# Patient Record
Sex: Female | Born: 1950
Health system: Southern US, Community
[De-identification: ages and names within clinical notes are randomized; demographics above are authoritative.]

## PROBLEM LIST (undated history)

## (undated) DIAGNOSIS — R079 Chest pain, unspecified: Secondary | ICD-10-CM

## (undated) DIAGNOSIS — M8588 Other specified disorders of bone density and structure, other site: Secondary | ICD-10-CM

## (undated) DIAGNOSIS — B1920 Unspecified viral hepatitis C without hepatic coma: Secondary | ICD-10-CM

## (undated) DIAGNOSIS — F329 Major depressive disorder, single episode, unspecified: Secondary | ICD-10-CM

## (undated) DIAGNOSIS — F32A Depression, unspecified: Secondary | ICD-10-CM

## (undated) DIAGNOSIS — R9439 Abnormal result of other cardiovascular function study: Secondary | ICD-10-CM

## (undated) DIAGNOSIS — M1990 Unspecified osteoarthritis, unspecified site: Secondary | ICD-10-CM

## (undated) DIAGNOSIS — J449 Chronic obstructive pulmonary disease, unspecified: Secondary | ICD-10-CM

## (undated) DIAGNOSIS — I10 Essential (primary) hypertension: Secondary | ICD-10-CM

## (undated) DIAGNOSIS — S82201A Unspecified fracture of shaft of right tibia, initial encounter for closed fracture: Secondary | ICD-10-CM

## (undated) HISTORY — DX: Depression, unspecified: F32.A

## (undated) HISTORY — DX: Chest pain, unspecified: R07.9

## (undated) HISTORY — DX: Unspecified osteoarthritis, unspecified site: M19.90

## (undated) HISTORY — DX: Major depressive disorder, single episode, unspecified: F32.9

## (undated) HISTORY — DX: Other specified disorders of bone density and structure, other site: M85.88

## (undated) HISTORY — DX: Unspecified viral hepatitis C without hepatic coma: B19.20

## (undated) HISTORY — DX: Abnormal result of other cardiovascular function study: R94.39

## (undated) HISTORY — DX: Unspecified fracture of shaft of right tibia, initial encounter for closed fracture: S82.201A

## (undated) HISTORY — DX: Chronic obstructive pulmonary disease, unspecified: J44.9

## (undated) HISTORY — DX: Essential (primary) hypertension: I10

---

## 2003-04-12 ENCOUNTER — Emergency Department (HOSPITAL_COMMUNITY): Admission: EM | Admit: 2003-04-12 | Discharge: 2003-04-12 | Payer: Self-pay

## 2004-05-04 ENCOUNTER — Emergency Department (HOSPITAL_COMMUNITY): Admission: EM | Admit: 2004-05-04 | Discharge: 2004-05-05 | Payer: Self-pay | Admitting: Emergency Medicine

## 2006-11-04 ENCOUNTER — Encounter: Admission: RE | Admit: 2006-11-04 | Discharge: 2006-11-04 | Payer: Self-pay | Admitting: Internal Medicine

## 2006-11-04 DIAGNOSIS — B182 Chronic viral hepatitis C: Secondary | ICD-10-CM | POA: Insufficient documentation

## 2007-01-05 ENCOUNTER — Ambulatory Visit: Payer: Self-pay | Admitting: Gastroenterology

## 2007-06-07 ENCOUNTER — Emergency Department (HOSPITAL_COMMUNITY): Admission: EM | Admit: 2007-06-07 | Discharge: 2007-06-08 | Payer: Self-pay | Admitting: Emergency Medicine

## 2007-10-25 ENCOUNTER — Encounter (INDEPENDENT_AMBULATORY_CARE_PROVIDER_SITE_OTHER): Payer: Self-pay | Admitting: Nurse Practitioner

## 2008-02-16 ENCOUNTER — Emergency Department (HOSPITAL_COMMUNITY): Admission: EM | Admit: 2008-02-16 | Discharge: 2008-02-16 | Payer: Self-pay | Admitting: Family Medicine

## 2008-03-07 ENCOUNTER — Emergency Department (HOSPITAL_COMMUNITY): Admission: EM | Admit: 2008-03-07 | Discharge: 2008-03-07 | Payer: Self-pay | Admitting: Emergency Medicine

## 2008-03-24 ENCOUNTER — Ambulatory Visit: Payer: Self-pay | Admitting: Nurse Practitioner

## 2008-03-24 DIAGNOSIS — F17200 Nicotine dependence, unspecified, uncomplicated: Secondary | ICD-10-CM | POA: Insufficient documentation

## 2008-03-24 DIAGNOSIS — K029 Dental caries, unspecified: Secondary | ICD-10-CM | POA: Insufficient documentation

## 2008-03-27 ENCOUNTER — Telehealth (INDEPENDENT_AMBULATORY_CARE_PROVIDER_SITE_OTHER): Payer: Self-pay | Admitting: Nurse Practitioner

## 2008-04-04 ENCOUNTER — Encounter (INDEPENDENT_AMBULATORY_CARE_PROVIDER_SITE_OTHER): Payer: Self-pay | Admitting: Nurse Practitioner

## 2008-04-25 ENCOUNTER — Encounter (INDEPENDENT_AMBULATORY_CARE_PROVIDER_SITE_OTHER): Payer: Self-pay | Admitting: *Deleted

## 2008-05-05 ENCOUNTER — Ambulatory Visit: Payer: Self-pay | Admitting: Nurse Practitioner

## 2008-05-17 LAB — CONVERTED CEMR LAB
Alkaline Phosphatase: 62 units/L (ref 39–117)
BUN: 23 mg/dL (ref 6–23)
Basophils Absolute: 0 10*3/uL (ref 0.0–0.1)
Creatinine, Ser: 0.81 mg/dL (ref 0.40–1.20)
Eosinophils Absolute: 0.1 10*3/uL (ref 0.0–0.7)
Glucose, Bld: 102 mg/dL — ABNORMAL HIGH (ref 70–99)
HCV Quantitative: 2900000 intl units/mL — ABNORMAL HIGH (ref <43)
Hep A Total Ab: POSITIVE — AB
Hep B E Ab: POSITIVE — AB
Hep B S Ab: POSITIVE — AB
Lymphs Abs: 1.7 10*3/uL (ref 0.7–4.0)
MCV: 93.5 fL (ref 78.0–100.0)
Neutrophils Relative %: 48 % (ref 43–77)
Platelets: 183 10*3/uL (ref 150–400)
Sodium: 139 meq/L (ref 135–145)
Total Bilirubin: 0.5 mg/dL (ref 0.3–1.2)
Total Protein: 7.9 g/dL (ref 6.0–8.3)
WBC: 3.9 10*3/uL — ABNORMAL LOW (ref 4.0–10.5)

## 2008-06-07 ENCOUNTER — Encounter (INDEPENDENT_AMBULATORY_CARE_PROVIDER_SITE_OTHER): Payer: Self-pay | Admitting: Nurse Practitioner

## 2008-06-08 ENCOUNTER — Encounter (INDEPENDENT_AMBULATORY_CARE_PROVIDER_SITE_OTHER): Payer: Self-pay | Admitting: Nurse Practitioner

## 2008-06-16 ENCOUNTER — Encounter (INDEPENDENT_AMBULATORY_CARE_PROVIDER_SITE_OTHER): Payer: Self-pay | Admitting: Nurse Practitioner

## 2008-06-22 ENCOUNTER — Encounter (INDEPENDENT_AMBULATORY_CARE_PROVIDER_SITE_OTHER): Payer: Self-pay | Admitting: Nurse Practitioner

## 2008-06-22 ENCOUNTER — Ambulatory Visit: Payer: Self-pay | Admitting: Gastroenterology

## 2008-07-17 ENCOUNTER — Ambulatory Visit: Payer: Self-pay | Admitting: Nurse Practitioner

## 2008-09-04 ENCOUNTER — Ambulatory Visit: Payer: Self-pay | Admitting: Nurse Practitioner

## 2008-09-10 HISTORY — PX: TIBIA FRACTURE SURGERY: SHX806

## 2008-09-23 ENCOUNTER — Inpatient Hospital Stay (HOSPITAL_COMMUNITY): Admission: EM | Admit: 2008-09-23 | Discharge: 2008-09-27 | Payer: Self-pay | Admitting: Emergency Medicine

## 2008-09-23 DIAGNOSIS — S82209A Unspecified fracture of shaft of unspecified tibia, initial encounter for closed fracture: Secondary | ICD-10-CM | POA: Insufficient documentation

## 2008-09-23 DIAGNOSIS — S82409A Unspecified fracture of shaft of unspecified fibula, initial encounter for closed fracture: Secondary | ICD-10-CM

## 2008-09-23 DIAGNOSIS — S62609B Fracture of unspecified phalanx of unspecified finger, initial encounter for open fracture: Secondary | ICD-10-CM | POA: Insufficient documentation

## 2008-10-04 ENCOUNTER — Telehealth (INDEPENDENT_AMBULATORY_CARE_PROVIDER_SITE_OTHER): Payer: Self-pay | Admitting: Nurse Practitioner

## 2008-10-30 ENCOUNTER — Encounter (INDEPENDENT_AMBULATORY_CARE_PROVIDER_SITE_OTHER): Payer: Self-pay | Admitting: Nurse Practitioner

## 2008-10-30 ENCOUNTER — Ambulatory Visit: Payer: Self-pay | Admitting: Nurse Practitioner

## 2008-10-30 DIAGNOSIS — N959 Unspecified menopausal and perimenopausal disorder: Secondary | ICD-10-CM | POA: Insufficient documentation

## 2008-10-30 DIAGNOSIS — H6120 Impacted cerumen, unspecified ear: Secondary | ICD-10-CM | POA: Insufficient documentation

## 2008-11-01 ENCOUNTER — Encounter (INDEPENDENT_AMBULATORY_CARE_PROVIDER_SITE_OTHER): Payer: Self-pay | Admitting: Nurse Practitioner

## 2008-11-06 ENCOUNTER — Ambulatory Visit (HOSPITAL_COMMUNITY): Admission: RE | Admit: 2008-11-06 | Discharge: 2008-11-06 | Payer: Self-pay | Admitting: Gastroenterology

## 2008-11-06 ENCOUNTER — Encounter (INDEPENDENT_AMBULATORY_CARE_PROVIDER_SITE_OTHER): Payer: Self-pay | Admitting: Interventional Radiology

## 2008-11-08 ENCOUNTER — Ambulatory Visit (HOSPITAL_COMMUNITY): Admission: RE | Admit: 2008-11-08 | Discharge: 2008-11-08 | Payer: Self-pay | Admitting: Internal Medicine

## 2008-11-08 ENCOUNTER — Ambulatory Visit: Payer: Self-pay | Admitting: Nurse Practitioner

## 2008-11-09 ENCOUNTER — Encounter (INDEPENDENT_AMBULATORY_CARE_PROVIDER_SITE_OTHER): Payer: Self-pay | Admitting: Nurse Practitioner

## 2008-11-29 ENCOUNTER — Encounter (INDEPENDENT_AMBULATORY_CARE_PROVIDER_SITE_OTHER): Payer: Self-pay | Admitting: Nurse Practitioner

## 2009-02-19 ENCOUNTER — Ambulatory Visit: Payer: Self-pay | Admitting: Nurse Practitioner

## 2009-02-19 DIAGNOSIS — H547 Unspecified visual loss: Secondary | ICD-10-CM | POA: Insufficient documentation

## 2009-02-23 ENCOUNTER — Telehealth (INDEPENDENT_AMBULATORY_CARE_PROVIDER_SITE_OTHER): Payer: Self-pay | Admitting: Nurse Practitioner

## 2009-05-03 ENCOUNTER — Ambulatory Visit: Payer: Self-pay | Admitting: Nurse Practitioner

## 2009-11-02 ENCOUNTER — Telehealth (INDEPENDENT_AMBULATORY_CARE_PROVIDER_SITE_OTHER): Payer: Self-pay | Admitting: Nurse Practitioner

## 2009-11-05 ENCOUNTER — Encounter (INDEPENDENT_AMBULATORY_CARE_PROVIDER_SITE_OTHER): Payer: Self-pay | Admitting: Nurse Practitioner

## 2009-11-06 ENCOUNTER — Ambulatory Visit: Payer: Self-pay | Admitting: Nurse Practitioner

## 2009-11-06 ENCOUNTER — Other Ambulatory Visit: Admission: RE | Admit: 2009-11-06 | Discharge: 2009-11-06 | Payer: Self-pay | Admitting: Internal Medicine

## 2009-11-12 ENCOUNTER — Ambulatory Visit (HOSPITAL_COMMUNITY): Admission: RE | Admit: 2009-11-12 | Discharge: 2009-11-12 | Payer: Self-pay | Admitting: Internal Medicine

## 2009-11-13 ENCOUNTER — Encounter (INDEPENDENT_AMBULATORY_CARE_PROVIDER_SITE_OTHER): Payer: Self-pay | Admitting: Nurse Practitioner

## 2010-01-09 ENCOUNTER — Encounter (INDEPENDENT_AMBULATORY_CARE_PROVIDER_SITE_OTHER): Payer: Self-pay | Admitting: Nurse Practitioner

## 2010-03-03 ENCOUNTER — Encounter: Payer: Self-pay | Admitting: Gastroenterology

## 2010-03-04 ENCOUNTER — Encounter: Payer: Self-pay | Admitting: Gastroenterology

## 2010-03-10 LAB — CONVERTED CEMR LAB
ALT: 68 units/L — ABNORMAL HIGH (ref 0–35)
AST: 79 units/L — ABNORMAL HIGH (ref 0–37)
Albumin: 4.7 g/dL (ref 3.5–5.2)
Alkaline Phosphatase: 64 units/L (ref 39–117)
BUN: 17 mg/dL (ref 6–23)
Basophils Absolute: 0 10*3/uL (ref 0.0–0.1)
Basophils Relative: 0 % (ref 0–1)
Blood in Urine, dipstick: NEGATIVE
Blood in Urine, dipstick: NEGATIVE
Calcium: 9.9 mg/dL (ref 8.4–10.5)
Chloride: 102 meq/L (ref 96–112)
Cholesterol, target level: 200 mg/dL
Creatinine, Urine: 145.6 mg/dL
Eosinophils Absolute: 0.1 10*3/uL (ref 0.0–0.7)
GC Probe Amp, Genital: NEGATIVE
GC Probe Amp, Genital: NEGATIVE
HDL: 53 mg/dL (ref >39)
KOH Prep: NEGATIVE
Ketones, urine, test strip: NEGATIVE
MCHC: 34.4 g/dL (ref 30.0–36.0)
MCV: 93.2 fL (ref 78.0–100.0)
Microalb, Ur: 2.77 mg/dL — ABNORMAL HIGH (ref 0.00–1.89)
Microalb, Ur: 3.34 mg/dL — ABNORMAL HIGH (ref 0.00–1.89)
Monocytes Relative: 7 % (ref 3–12)
Neutro Abs: 1.8 10*3/uL (ref 1.7–7.7)
Neutrophils Relative %: 39 % — ABNORMAL LOW (ref 43–77)
Nitrite: NEGATIVE
Nitrite: NEGATIVE
Platelets: 233 10*3/uL (ref 150–400)
Potassium: 3.9 meq/L (ref 3.5–5.3)
Protein, U semiquant: NEGATIVE
RBC: 4.84 M/uL (ref 3.87–5.11)
Sodium: 140 meq/L (ref 135–145)
Specific Gravity, Urine: 1.025
Specific Gravity, Urine: 1.025
Total Protein: 8.4 g/dL — ABNORMAL HIGH (ref 6.0–8.3)
Triglycerides: 166 mg/dL — ABNORMAL HIGH (ref <150)
Urobilinogen, UA: 1
WBC: 4.7 10*3/uL (ref 4.0–10.5)

## 2010-03-12 NOTE — Progress Notes (Signed)
Summary: [facility] Intake (out of funds)  Phone Note Call from Patient Call back at [facility] Phone [phone]   Summary of Call: Mr Christine Hurley states that the provider referral her to go to "[facility]ake" ([employer]) and she spoke with Mamie Levers and she was informed that the place is out of funds for this program.  Pt wants to know if the provider can referral to another place because she still needs an eye care visit Initial call taken by: Manon Hilding,  February 23, 2009 1:03 PM  Follow-up for Phone Call        after speaking with Debra(eligibility)that program is out of money and the services for the blind are limited to what services  they can offer due to money restraints. Per [name] is to offer patient 2 organizations can contact directly for assistance. We are not able to make those referrals.  [facility] [phone] [facility] [phone] [name] has been notified and advised to give our office a call once she has spoken with these organizations as I am not familiar with their services. Pt agreed. Will forward to provider for FYI..... Follow-up by: Mikey College CMA,  February 26, 2009 11:22 AM  Additional Follow-up for Phone Call Additional follow up Details #1::        noted Additional Follow-up by: [name] FNP,  February 26, 2009 12:17 PM     Appended Document: [facility] Intake (out of funds) pt did get in contact with [facility] and they are sending her an application to complete and she will f/u with our office in a couple of weeks.Marland KitchenMarland KitchenMarland Kitchen

## 2010-03-12 NOTE — Assessment & Plan Note (Signed)
Summary: HTN   Vital Signs:  Patient profile:   60 year old female Menstrual status:  postmenopausal Weight:      171.1 pounds BMI:     29.48 BSA:     1.83 Temp:     97.7 degrees F oral Pulse rate:   54 / minute Pulse rhythm:   regular Resp:     16 per minute BP sitting:   179 / 93  (left arm) Cuff size:   regular  Vitals Entered By: Levon Hedger (May 03, 2009 3:37 PM) CC: renew medication, Hypertension Management Is Patient Diabetic? No Pain Assessment Patient in pain? no       Does patient need assistance? Functional Status Self care Ambulation Normal   CC:  renew medication and Hypertension Management.  History of Present Illness:  Pt into the office for follow up.  Optho - Pt was contacted by [facility] and has an eye appt on next week with a provider in [place].  She currently wears glasses but is having problems seeing and knows she needs an updated exam.   Social - pt is doing well.  No acute complaints today.  Hypertension History:      She denies headache, chest pain, and palpitations.  She notes no problems with any antihypertensive medication side effects.  Pt is taking meds as ordered.        Positive major cardiovascular risk factors include female age 56 years old or older, hypertension, and current tobacco user.  Negative major cardiovascular risk factors include negative family history for ischemic heart disease.        Further assessment for target organ damage reveals no history of ASHD, cardiac end-organ damage (CHF/LVH), stroke/TIA, peripheral vascular disease, renal insufficiency, or hypertensive retinopathy.     Habits & Providers  Alcohol-Tobacco-Diet     Alcohol drinks/day: 2     Alcohol Counseling: to STOP drinking     Alcohol type: beer     Tobacco Status: current     Tobacco Counseling: to quit use of tobacco products     Cigarette Packs/Day: 0.25     Year Started: age 64  Exercise-Depression-Behavior     Does Patient  Exercise: yes     Exercise Counseling: not indicated; exercise is adequate     Type of exercise: walking     Exercise (avg: min/session): 30-60     Have you felt down or hopeless? no     Have you felt little pleasure in things? no     Depression Counseling: not indicated; screening negative for depression     Drug Use: no     Seat Belt Use: 100     Sun Exposure: occasionally  Allergies (verified): No Known Drug Allergies  Social History: Does Patient Exercise:  yes  Review of Systems General:  Denies fever. CV:  Denies chest pain or discomfort. Resp:  Denies cough. GI:  Denies abdominal pain, nausea, and vomiting. MS:  Denies joint pain.  Physical Exam  General:  alert.   Head:  normocephalic.   Lungs:  normal breath sounds.   Heart:  normal rate and regular rhythm.   Abdomen:  normal bowel sounds.   Msk:  up to the exam table Neurologic:  alert & oriented X3.   Skin:  color normal.   Psych:  Oriented X3.     Impression & Recommendations:  Problem # 1:  HYPERTENSION, BENIGN ESSENTIAL (ICD-401.1) DASH diet bp is still elevated will increase meds Her updated medication  list for this problem includes:    Lisinopril-hydrochlorothiazide 20-25 Mg Tabs (Lisinopril-hydrochlorothiazide) ..... One tablet by mouth daily for blood pressure  Problem # 2:  TOBACCO ABUSE (ICD-305.1) advised cessation  Problem # 3:  HEPATITIS C (ICD-070.51) pt continues to drink ETOH despite Dx, advised pt not to drink  Complete Medication List: 1)  Lisinopril-hydrochlorothiazide 20-25 Mg Tabs (Lisinopril-hydrochlorothiazide) .... One tablet by mouth daily for blood pressure 2)  Tramadol Hcl 50 Mg Tabs (Tramadol hcl) .... One or two pills every 8 hours for pain  Hypertension Assessment/Plan:      The patient's hypertensive risk group is category B: At least one risk factor (excluding diabetes) with no target organ damage.  Her calculated 10 year risk of coronary heart disease is 13 %.   Today's blood pressure is 179/93.  Her blood pressure goal is < 140/90.  Patient Instructions: 1)  Your blood pressure is still high. 2)  Your blood pressure medications will be increased. 3)  Be sure to monitor your diet - avoid salty foods. 4)  Keep your eye appointment on next week. 5)  Follow up in 6 months for a complete physical exam Prescriptions: LISINOPRIL-HYDROCHLOROTHIAZIDE 20-25 MG TABS (LISINOPRIL-HYDROCHLOROTHIAZIDE) One tablet by mouth daily for blood pressure  #30 x 5   Entered and Authorized by:   [name] FNP   Signed by:   [name] FNP on 05/03/2009   Method used:   Print then Give to Patient   RxID:   [id number]

## 2010-03-12 NOTE — Progress Notes (Signed)
Summary: Query:  Refill lisinopril-HCTZ?  Phone Note Outgoing Call   Summary of Call: Do you want to refill her lisinopril-HCTZ?  Last seen 04/2009. Initial call taken by: Dutch Quint RN,  November 02, 2009 11:47 AM  Follow-up for Phone Call        advise pt that she need to schedule an appt in October she should be seen twice per year ok to refill Follow-up by: [name] FNP,  November 02, 2009 1:14 PM  Additional Follow-up for Phone Call Additional follow up Details #1::        Refills completed.  Phone is temporarily disconnected.  Dutch Quint RN  November 02, 2009 2:57 PM  Phone is temporarily disconnected.  Letter sent.  Dutch Quint RN  November 05, 2009 10:24 AM

## 2010-03-12 NOTE — Letter (Signed)
Summary: MAILED REQUESTED RECORDS TO [facility]  MAILED REQUESTED RECORDS TO [facility]   Imported By: Arta Bruce 01/09/2010 10:53:31  _____________________________________________________________________  External Attachment:    Type:   Image     Comment:   External Document

## 2010-03-12 NOTE — Assessment & Plan Note (Signed)
Summary: Complete Physical Exam   Vital Signs:  Patient profile:   60 year old female Menstrual status:  postmenopausal Height:      64 inches Weight:      165.6 pounds BMI:     28.53 BSA:     1.81 Temp:     97.4 degrees F oral Pulse rate:   54 / minute Pulse rhythm:   regular Resp:     20 per minute BP sitting:   160 / 100  (left arm) Cuff size:   regular  Vitals Entered By: Levon Hedger (November 06, 2009 11:41 AM)  Nutrition Counseling: Patient's BMI is greater than 25 and therefore counseled on weight management options.  History of Present Illness:  pt into the office for complete physical exam  PAP - last pap done 1 year ago. no family history of cervical or ovarian cancer postmenopausal  since year 2000  Mammogram - no family history of breast cancer last done 1 year ago.  Optho - no recent eye exam. she does wear reading glasses  Dental - no recent dental exam  tdap - up to date  Bone density - done in 2010  Social - pt has been married 5 times and is currently in a relationship with 2 men  Hypertension History:      She denies headache, chest pain, and palpitations.  She notes no problems with any antihypertensive medication side effects.        Positive major cardiovascular risk factors include female age 86 years old or older, hypertension, and current tobacco user.  Negative major cardiovascular risk factors include no history of diabetes and negative family history for ischemic heart disease.        Further assessment for target organ damage reveals no history of ASHD, cardiac end-organ damage (CHF/LVH), stroke/TIA, peripheral vascular disease, renal insufficiency, or hypertensive retinopathy.    Lipid Management History:      Positive NCEP/ATP III risk factors include female age 43 years old or older, current tobacco user, and hypertension.  Negative NCEP/ATP III risk factors include no history of early menopause without estrogen hormone replacement,  non-diabetic, no family history for ischemic heart disease, no ASHD (atherosclerotic heart disease), no prior stroke/TIA, no peripheral vascular disease, and no history of aortic aneurysm.        The patient states that she does not know about the "Therapeutic Lifestyle Change" diet.  The patient does not know about adjunctive measures for cholesterol lowering.  She expresses no side effects from her lipid-lowering medication.  The patient denies any symptoms to suggest myopathy or liver disease.      Habits & Providers  Alcohol-Tobacco-Diet     Alcohol drinks/day: 2     Alcohol Counseling: to STOP drinking     Alcohol type: beer     Tobacco Status: current     Tobacco Counseling: to quit use of tobacco products     Cigarette Packs/Day: 0.25     Year Started: age 57  Exercise-Depression-Behavior     Does Patient Exercise: yes     Exercise Counseling: not indicated; exercise is adequate     Type of exercise: walking     Exercise (avg: min/session): 30-60     Depression Counseling: not indicated; screening negative for depression     Drug Use: no     Seat Belt Use: 100     Sun Exposure: occasionally  Comments: PHQ-9 score = 10  Allergies: No Known Drug Allergies  Review  of Systems General:  Denies loss of appetite. Eyes:  Denies blurring. ENT:  Denies earache. CV:  Denies chest pain or discomfort. Resp:  Denies cough. GI:  Denies abdominal pain, constipation, nausea, and vomiting. GU:  Denies discharge. MS:  Denies joint pain. Derm:  Denies dryness. Neuro:  Denies headaches. Psych:  Denies anxiety and depression.  Physical Exam  General:  alert.   Head:  normocephalic.   Eyes:  pupils round.  glasses Ears:  right - cerumen impacted left - TM visible with bony landmarks present Nose:  no nasal discharge.   Mouth:  pharynx pink and moist and fair dentition.   Neck:  supple.   Chest Wall:  no mass.   Breasts:  skin/areolae normal, no masses, and no abnormal  thickening.   Lungs:  few scattered wheezes  Heart:  normal rate and regular rhythm.   Abdomen:  soft, non-tender, and normal bowel sounds.   Rectal:  external hemorrhoid(s).   Msk:  up to the exam table Pulses:  R radial normal and L radial normal.   Extremities:  no edema Neurologic:  alert & oriented X3.   Skin:  color normal.   Psych:  Oriented X3.    Pelvic Exam  Vulva:      normal appearance.   Urethra and Bladder:      Urethra--normal.   Vagina:      post-menopausal.   Cervix:      midposition.   Uterus:      smooth.   Adnexa:      nontender bilaterally.   Rectum:      + external hemorrhoids.      Impression & Recommendations:  Problem # 1:  ROUTINE GYNECOLOGICAL EXAMINATION (ICD-V72.31) labs done except lipids EKG done PHQ-9 score = 10 PAP done rec optho and dental exam guaiac negative Orders: Hemoccult Guaiac-1 spec.(in office) (82270) KOH/ WET Mount [phone]) Pap Smear, Thin Prep ( Collection of) (K4401) UA Dipstick w/o Micro (manual) (02725) T- GC Chlamydia (36644) T-TSH (03474-25956) Rapid HIV  (38756)  Problem # 2:  HYPERTENSION, BENIGN ESSENTIAL (ICD-401.1) BP elevated today pt smoking and drinking caffiene - educated pt that this elevates BP Her updated medication list for this problem includes:    Lisinopril-hydrochlorothiazide 20-25 Mg Tabs (Lisinopril-hydrochlorothiazide) ..... One tablet by mouth daily for blood pressure  Orders: T-Comprehensive Metabolic Panel (43329-51884) T-CBC w/Diff (16606-30160) T-Urine Microalbumin w/creat. ratio [phone]) T-TSH (54270-62376) EKG w/ Interpretation (93000)  Problem # 3:  TOBACCO ABUSE (ICD-305.1) advised cessation  Problem # 4:  UNSPECIFIED BREAST SCREENING (ICD-V76.10)  Orders: Mammogram (Screening) (Mammo)  Complete Medication List: 1)  Lisinopril-hydrochlorothiazide 20-25 Mg Tabs (Lisinopril-hydrochlorothiazide) .... One tablet by mouth daily for blood pressure 2)  Tramadol  Hcl 50 Mg Tabs (Tramadol hcl) .... One or two pills every 8 hours for pain  Hypertension Assessment/Plan:      The patient's hypertensive risk group is category B: At least one risk factor (excluding diabetes) with no target organ damage.  Her calculated 10 year risk of coronary heart disease is 13 %.  Today's blood pressure is 160/100.  Her blood pressure goal is < 140/90.  Lipid Assessment/Plan:      Based on NCEP/ATP III, the patient's risk factor category is "0-1 risk factors".  The patient's lipid goals are as follows: Total cholesterol goal is 200; LDL cholesterol goal is 130; HDL cholesterol goal is 40; Triglyceride goal is 150.    Patient Instructions: 1)  Schedule a nurse visit in 1 week  for fasting labs and blood pressure. 2)  Need lipids.  Blood pressure goal is 140/90 3)  You will be informed of any abnormal lab results. 4)  Flu vaccines will be here in October. Call to see when available so you can come get yours.  It is recommended yearly. 5)  Follow up with n.martin,fnp in 6 months for high blood pressure or sooner if necessary  Laboratory Results   Urine Tests    Routine Urinalysis   Color: yellow Glucose: negative   (Normal Range: Negative) Bilirubin: negative   (Normal Range: Negative) Ketone: negative   (Normal Range: Negative) Spec. Gravity: 1.025   (Normal Range: 1.003-1.035) Blood: negative   (Normal Range: Negative) pH: 6.0   (Normal Range: 5.0-8.0) Protein: negative   (Normal Range: Negative) Urobilinogen: 2.0   (Normal Range: 0-1) Nitrite: negative   (Normal Range: Negative) Leukocyte Esterace: trace   (Normal Range: Negative)    Date/Time Received: November 06, 2009 12:47 PM   Wet Mount/KOH Source: vaginal WBC/hpf: 1-5 Bacteria/hpf: rare Clue cells/hpf: none Yeast/hpf: none Trichomonas/hpf: none  Stool - Occult Blood Hemmoccult #1: negative Date: 11/06/2009    Laboratory Results   Urine Tests    Routine Urinalysis   Color:  yellow Glucose: negative   (Normal Range: Negative) Bilirubin: negative   (Normal Range: Negative) Ketone: negative   (Normal Range: Negative) Spec. Gravity: 1.025   (Normal Range: 1.003-1.035) Blood: negative   (Normal Range: Negative) pH: 6.0   (Normal Range: 5.0-8.0) Protein: negative   (Normal Range: Negative) Urobilinogen: 2.0   (Normal Range: 0-1) Nitrite: negative   (Normal Range: Negative) Leukocyte Esterace: trace   (Normal Range: Negative)      Wet Mount Wet Mount KOH: Negative   Prevention & Chronic Care Immunizations   Influenza vaccine: refused  (02/19/2009)   Influenza vaccine deferral: Refused  (02/19/2009)    Tetanus booster: 08/10/2008: historical - done in ER    Pneumococcal vaccine: Not documented  Colorectal Screening   Hemoccult: Not documented    Colonoscopy: Not documented  Other Screening   Pap smear:  Specimen Adequacy: Satisfactory for evaluation.   Interpretation/Result:Negative for intraepithelial Lesion or Malignancy.     (10/30/2008)    Mammogram: ASSESSMENT: Negative - BI-RADS 1^MM DIGITAL SCREENING  (11/08/2008)   Smoking status: current  (11/06/2009)   Smoking cessation counseling: yes  (03/24/2008)  Lipids   Total Cholesterol: 143  (11/08/2008)   LDL: 57  (11/08/2008)   LDL Direct: Not documented   HDL: 53  (11/08/2008)   Triglycerides: 166  (11/08/2008)  Hypertension   Last Blood Pressure: 160 / 100  (11/06/2009)   Serum creatinine: 0.81  (05/05/2008)   Serum potassium 4.6  (05/05/2008) CMP ordered   Self-Management Support :   Personal Goals (by the next clinic visit) :      Personal blood pressure goal: 140/90  (11/06/2009)   Patient will work on the following items until the next clinic visit to reach self-care goals:     Medications and monitoring: take my medicines every day, check my blood pressure, bring all of my medications to every visit  (11/06/2009)     Eating: limit or avoid alcohol  (11/06/2009)     Hypertension self-management support: Not documented

## 2010-03-12 NOTE — Assessment & Plan Note (Signed)
Summary: Vision problems   Vital Signs:  Patient profile:   60 year old female Menstrual status:  postmenopausal Weight:      168.2 pounds BMI:     28.98 BSA:     1.82 Temp:     97.6 degrees F oral Pulse rate:   55 / minute Pulse rhythm:   regular Resp:     16 per minute BP sitting:   167 / 96  (left arm) Cuff size:   regular  Vitals Entered By: Levon Hedger (February 19, 2009 11:18 AM) CC: eye problem, pt states that she can't see, Hypertension Management Is Patient Diabetic? No Pain Assessment Patient in pain? no       Does patient need assistance? Functional Status Self care Ambulation Normal   CC:  eye problem, pt states that she can't see, and Hypertension Management.  History of Present Illness:  Due to time constraints pt is not able to have a comprehensive follow up appointment. Only has 5 minutes to speak with provider  Vision - wears glasses but has noticed increased problems with her eyes.  Had appt in [facility] for an eye exam but she was not able to keep it due to transportation.  Recognizes that she does still need an eye exam and would like to be scheduled.  Last exam over 1 year ago  Hypertension History:      She denies headache, chest pain, and palpitations.  She notes no problems with any antihypertensive medication side effects.  reports she is taking meds as ordered but no meds in the office today with pt.        Positive major cardiovascular risk factors include female age 4 years old or older, hypertension, and current tobacco user.  Negative major cardiovascular risk factors include negative family history for ischemic heart disease.        Further assessment for target organ damage reveals no history of ASHD, cardiac end-organ damage (CHF/LVH), stroke/TIA, peripheral vascular disease, renal insufficiency, or hypertensive retinopathy.     Allergies (verified): No Known Drug Allergies  Review of Systems Eyes:  Complains of blurring; denies  red eye. CV:  Denies chest pain or discomfort. Resp:  Denies cough. GI:  Denies abdominal pain, nausea, and vomiting.  Physical Exam  General:  alert.   Head:  normocephalic.   Msk:  normal ROM.   Neurologic:  alert & oriented X3.   Skin:  color normal.   Psych:  Oriented X3.     Impression & Recommendations:  Problem # 1:  VISUAL ACUITY, DECREASED (ICD-369.9) pt does need an eye exam but is not able to afford to pay for an exam offer number to [facility] and perhaps she can be seen by their eye intake corrdinators  Problem # 2:  HYPERTENSION, BENIGN ESSENTIAL (ICD-401.1) elevated today DASH diet advised pt to continue to take meds but she may need a dose adjustment Her updated medication list for this problem includes:    Lisinopril-hydrochlorothiazide 10-12.5 Mg Tabs (Lisinopril-hydrochlorothiazide) ..... One tablet by mouth daily for blood pressure  Complete Medication List: 1)  Lisinopril-hydrochlorothiazide 10-12.5 Mg Tabs (Lisinopril-hydrochlorothiazide) .... One tablet by mouth daily for blood pressure 2)  Tramadol Hcl 50 Mg Tabs (Tramadol hcl) .... One or two pills every 8 hours for pain  Hypertension Assessment/Plan:      The patient's hypertensive risk group is category B: At least one risk factor (excluding diabetes) with no target organ damage.  Her calculated 10 year risk of  coronary heart disease is 13 %.  Today's blood pressure is 167/96.  Her blood pressure goal is < 140/90.  Patient Instructions: 1)  Eye care specialist  2)  Blood pressure is elevated today. Take medications as ordered. 3)  Follow up in 3 months with n.martin, fnp for blood pressure.  if still elevated then she needs a change in BP meds  Prevention & Chronic Care Immunizations   Influenza vaccine: refused  (02/19/2009)   Influenza vaccine deferral: Refused  (02/19/2009)    Tetanus booster: 08/10/2008: historical - done in ER    Pneumococcal vaccine: Not documented  Colorectal Screening    Hemoccult: Not documented    Colonoscopy: Not documented  Other Screening   Pap smear:  Specimen Adequacy: Satisfactory for evaluation.   Interpretation/Result:Negative for intraepithelial Lesion or Malignancy.     (10/30/2008)    Mammogram: ASSESSMENT: Negative - BI-RADS 1^MM DIGITAL SCREENING  (11/08/2008)   Smoking status: current  (10/30/2008)   Smoking cessation counseling: yes  (03/24/2008)  Lipids   Total Cholesterol: 143  (11/08/2008)   LDL: 57  (11/08/2008)   LDL Direct: Not documented   HDL: 53  (11/08/2008)   Triglycerides: 166  (11/08/2008)  Hypertension   Last Blood Pressure: 167 / 96  (02/19/2009)   Serum creatinine: 0.81  (05/05/2008)   Serum potassium 4.6  (05/05/2008)  Self-Management Support :    Hypertension self-management support: Not documented

## 2010-03-12 NOTE — Letter (Signed)
Summary: MAILED REQUESTED RECORDS TO [facility]  MAILED REQUESTED RECORDS TO [facility]   Imported By: Arta Bruce 01/09/2010 12:12:06  _____________________________________________________________________  External Attachment:    Type:   Image     Comment:   External Document

## 2010-03-12 NOTE — Letter (Signed)
Summary: Generic Letter  [facility]  [address] [place], [place] [address]   Phone: [phone]  Fax: [phone]        11/05/2009  [facility] [address] [place], [place]  [address]  Dear Ms. Nissen,  We have been unable to contact you by telephone.  Please contact our office, at your earliest convenience, so that we may speak with you.  Sincerely,   Dutch Quint RN

## 2010-03-12 NOTE — Letter (Signed)
Summary: *HSN Results Follow up  [facility]  [address] [place], [place] [address]   Phone: [phone]  Fax: [phone]      11/13/2009   [facility] [address] [place], [place]  [address]   Dear  [name],                            ____S.Drinkard,FNP   ____D. Gore,FNP       ____B. McPherson,MD   ____V. Rankins,MD    ____E. Mulberry,MD    _X___N. Daphine Deutscher, FNP  ____D. Reche Dixon, MD    ____K. Philipp Deputy, MD    ____Other     This letter is to inform you that your recent test(s):  ___X____Pap Smear    _______Lab Test     _______X-ray    ____X___ is within acceptable limits  _______ requires a medication change  _______ requires a follow-up lab visit  _______ requires a follow-up visit with your provider   Comments:  Pap Smear results are normal.       _________________________________________________________ If you have any questions, please contact our office [phone].                    Sincerely,    [name] FNP [facility]

## 2010-03-12 NOTE — Letter (Signed)
Summary: Generic Letter  [facility]  [address] [place], [place] [address]   Phone: [phone]  Fax: [phone]         11/05/2009  [facility] [address] [place], [place]  [address]  Dear Ms. Tryon,  We have been unable to contact you by telephone.  Please call our office, at your earliest convenience, so that we may speak with you.  Sincerely,   Dutch Quint RN

## 2010-03-12 NOTE — Progress Notes (Signed)
Summary: Office Visit//DEPRESSION SCREENING  Office Visit//DEPRESSION SCREENING   Imported By: Arta Bruce 11/06/2009 14:50:53  _____________________________________________________________________  External Attachment:    Type:   Image     Comment:   External Document

## 2010-05-17 LAB — APTT: aPTT: 28 seconds (ref 24–37)

## 2010-05-17 LAB — CBC
HCT: 40.2 % (ref 36.0–46.0)
Hemoglobin: 13.8 g/dL (ref 12.0–15.0)
MCHC: 34.3 g/dL (ref 30.0–36.0)
MCV: 94.2 fL (ref 78.0–100.0)
Platelets: 205 10*3/uL (ref 150–400)
RDW: 12.5 % (ref 11.5–15.5)

## 2010-05-18 LAB — DIFFERENTIAL
Basophils Relative: 1 % (ref 0–1)
Eosinophils Absolute: 0 10*3/uL (ref 0.0–0.7)
Eosinophils Relative: 0 % (ref 0–5)
Lymphs Abs: 1.2 10*3/uL (ref 0.7–4.0)
Monocytes Relative: 4 % (ref 3–12)

## 2010-05-18 LAB — CBC
HCT: 39.4 % (ref 36.0–46.0)
Hemoglobin: 11.8 g/dL — ABNORMAL LOW (ref 12.0–15.0)
MCHC: 35.1 g/dL (ref 30.0–36.0)
MCV: 95 fL (ref 78.0–100.0)
Platelets: 172 10*3/uL (ref 150–400)
RBC: 3.6 MIL/uL — ABNORMAL LOW (ref 3.87–5.11)
WBC: 7.1 10*3/uL (ref 4.0–10.5)
WBC: 8.2 10*3/uL (ref 4.0–10.5)

## 2010-05-18 LAB — BASIC METABOLIC PANEL
BUN: 15 mg/dL (ref 6–23)
CO2: 23 mEq/L (ref 19–32)
CO2: 24 mEq/L (ref 19–32)
Calcium: 8.1 mg/dL — ABNORMAL LOW (ref 8.4–10.5)
Chloride: 107 mEq/L (ref 96–112)
Creatinine, Ser: 0.71 mg/dL (ref 0.4–1.2)
GFR calc Af Amer: >60 mL/min (ref >60)
GFR calc non Af Amer: >60 mL/min (ref >60)
Potassium: 3.8 mEq/L (ref 3.5–5.1)
Sodium: 135 mEq/L (ref 135–145)

## 2010-05-18 LAB — PROTIME-INR
INR: 1 (ref 0.00–1.49)
Prothrombin Time: 12.8 seconds (ref 11.6–15.2)

## 2010-05-18 LAB — APTT: aPTT: 29 seconds (ref 24–37)

## 2010-06-25 NOTE — Discharge Summary (Signed)
NAMESHEILA, OCASIO             ACCOUNT NO.:  [id number]   MEDICAL RECORD NO.:  [id number]          PATIENT TYPE:  INP   LOCATION:  5010                         FACILITY:  [facility]   PHYSICIAN:  Almedia Balls. Ranell Patrick, M.D. DATE OF BIRTH:  1950/09/02   DATE OF ADMISSION:  09/23/2008  DATE OF DISCHARGE:  09/27/2008                               DISCHARGE SUMMARY   ADMISSION DIAGNOSIS:  Open fracture of right foot along with tibia-  fibula fracture on the right.   DISCHARGE DIAGNOSIS:  Open fracture of right foot along with tibia-  fibula fracture on the right status post open reduction and internal  fixation and incision and drainage of right foot.   BRIEF HISTORY:  The patient is a 60 year old female who sustained a  ground-level fall with a lawn mower came into the emergency room  complaining about ankle pain with multiple lacerations to the foot and  toes of the right foot.  The patient was admitted for surgical  management for these issues.   PROCEDURE:  The patient had a right foot irrigation with laceration  repair, percutaneous pinning of fractured toes along with an  intramedullary nail of the fractured tibia by Dr. Malon Kindle on  September 23, 2008.  Assistant was [employer].  No complications.   HOSPITAL COURSE:  The patient was admitted on September 23, 2008 for the  above-stated procedures which she tolerated well.  After adequate time  in post anesthesia care unit, she was transferred to 5000.  Postop day  1, the patient had actually complained but only minimal pain and was  actually doing quite well.  The patient was nonweightbearing on the  right lower extremity throughout her entire hospital stay.  She did have  her dressing changed on postop 3 and actually did quite well with well  healing wounds both to the anterior knee and to the lateral foot and to  the plantar aspect of the right foot.  Neurovascularly she was intact.  She had good range of motion of her toes  even status post severe injury.  The patient will work with physical therapy in the next couple of days,  was able to get around with nonweightbearing using a walker and her pain  was under acceptable limits and no need for further IV antibiotics as  the patient is to be discharged home on September 27, 2008.   CONDITION:  Stable.   DIET:  Regular.   DISCHARGE MEDICATIONS:  1. Hydrochlorothiazide 12.5 mg p.o. daily.  2. Robaxin 500 mg p.o. q.6 hours.  3. Percocet 5/325 one to two tablets q. 4-6 h. p.r.n. pain.   FOLLOWUP:  The patient will follow back up in 4-5 days with Dr. Malon Kindle.      Thomas B. Durwin Nora, P.A.      Almedia Balls. Ranell Patrick, M.D.  Electronically Signed    TBD/MEDQ  D:  09/27/2008  T:  09/27/2008  Job:  [id number]

## 2010-06-25 NOTE — Op Note (Signed)
NAMESEIRRA, KOS             ACCOUNT NO.:  [id number]   MEDICAL RECORD NO.:  [id number]          PATIENT TYPE:  INP   LOCATION:  1827                         FACILITY:  [facility]   PHYSICIAN:  Almedia Balls. Ranell Patrick, M.D. DATE OF BIRTH:  [birth year]   DATE OF PROCEDURE:  09/23/2008  DATE OF DISCHARGE:                               OPERATIVE REPORT   PREOPERATIVE DIAGNOSIS:  Open right foot fractures and closed tibia-  fibula fracture following one more accident.   POSTOPERATIVE DIAGNOSES:  1. Closed tibia-fibula fracture with intra-articular extension.  2. Open great toe distal phalanx fracture.  3. Open second and third toe proximal phalangeal fractures.  4. Open fourth toe distal phalanx fracture.   PROCEDURE PERFORMED:  Right foot irrigation and debridement of multiple  open fractures with pinning of second, third toe and suture repair of  fourth toe fractures and debridement of great toe with loose closure and  then combination of percutaneous cannulated screw fixation and closed  intramedullary nailing of right distal tibia-fibula fracture.   SURGEON:  Almedia Balls. Ranell Patrick, MD   ASSISTANT:  Donnie Coffin. Dixon, PA-C   ANESTHESIA:  General anesthesia was used.   ESTIMATED BLOOD LOSS:  Minimal.   FLUID REPLACEMENT:  2500 mL crystalloid.   URINE OUTPUT:  600 mL.   Tourniquet was placed on the thigh that was not elevated.  Instrument  counts correct.   COMPLICATIONS:  None.   Perioperative antibiotics including Ancef and gentamicin were given.   INDICATIONS:  The patient is a 60 year old female, who was mowing her  lawn on a sloped hill with wet grass.  When she slipped and lost her  balance, she pulled the lawn mower on top of her, injuring her right  foot and tib-fib.  She presented with an obvious open deformity to her  foot including plantar laceration at the flexion crease of the MTP  joints of the lesser toes, partially amputated fourth toe tip and a  partial amputation  of great toe tip.  Radiographs demonstrating proximal  phalanx fractures of second and third toes, distal fracture of the  fourth toe and a partial tough loss of her great toe and fracture into  the joint.  The patient also has a spiral comminuted distal tib-fib  fracture with multiplanar extension into the tibial plafond and ankle  joint, and we counseled the patient regarding the need to get urgently  to the operating room.  She had IV antibiotics including gentamicin and  Ancef given in the emergency department as well as tetanus toxoid  booster.  She presents now for I and D, and then definitive  stabilization.  Informed consent obtained.   DESCRIPTION OF PROCEDURE:  After adequate level of anesthesia achieved,  the patient positioned supine on the operating room table.  The  radiolucent table was utilized.  Nonsterile tourniquet was placed on  right proximal thigh.  We went and sterilely prepped and draped the leg  in the usual manner.  We initially used pulsatile lavage irrigation as  well as sharp debridement of all of her open injuries for her foot,  again  she had oblique loss of the distal portion of her great toe on the  medial side.  This involved the nail bed, involved the great toe distal  phalanx.  There was grass in there.  We pulled the grass, we debrided  all devitalized tissue.  She also had plantar laceration at the flexion  crease of her MTP joint, second, third, and fourth toe and a partial  amputation her fourth toe dorsally with loss of the nail fold and nail  bed.  We went ahead after we had thoroughly pulse irrigated, debrided  all the devitalized tissue and then loosely closed her wounds.  Once we  did that we went attended her second and third toes to stabilize her  unstable proximal phalangeal fractures pinning to the metatarsal heads.  We also placed suture and did a suture repair of her near avulsion of  her fourth toe tip through the nail and then through  the proximal nail  fold and actually reduced the fracture nicely and stabilized it.  With  good viability of all the toes and good capillary refill, we concluded  the foot portion surgery having irrigated with 3 liters of pulsatile  normal saline irrigation.  We then went ahead and approached the distal  tib-fib fracture.  Utilizing C-arm and multiplanar imaging, we placed  two 4.5 Synthes stainless steel cannulated screws from medial to lateral  just above the ankle joint and the tibial plafond to keep the anterior  to posterior fracture line from expanding when we placed the nail.  These were placed anterior and posterior to the midline tibia to try to  keep the midportion of the tibia open for nailing.  Once we had those  screws in place with no displacement of distal fracture, we went ahead  and made a medial parapatellar approach to the proximal tibia using a  #10 blade.  The patellar retinaculum was divided medially.  We stayed  extra-articular the entire time, guidepin placed, centered on the  proximal tibia just into the joint line, we then used a step-cut drill  to open up the proximal tibia and placed a ball-tip guidewire with a  slight bend and crossed the fractured tibia.  This was a spiral  segmental fracture and then into the center portion of the plafond  missing the two screws.  We then went ahead and sequentially reamed up  to a 10.5 mm diameter to accommodate a 9-mm diameter nail which was 33  cm in length.  This was introduced to antegrade over the guidepin and  guide pin was removed.  We then noted there to be excellent alignment of  the fracture site and then placed our proximal interlocking screw.  This  was the distal oblique screw on the DePuy VersaNail.  Once that screw  was in, we removed our insertion handle and then placed it in extension.  This gave [place] locate the distal screw, our fracture was nicely  approximated and aligned.  We went ahead and did our  anterior to  posterior screw first to try to lag some fracture lines that were in  that direction which allowed for the anterior-posterior screw to provide  some compression.  We had to go and do a cutdown on the anterior tibia  as we were overlying the anterior tibial artery and the tibialis  anterior tendon.  We spread bluntly down to bone and directly visualized  bone, and drilled our distal hole using a freehand technique with the  C-  arm.  We were able to get an anterior-posterior screw with a perfect  length and no displacement of the fracture site.  We then did our  medial, lateral, distal interlocking screws again with freehand  technique using the C-arm and had good length of the screws as well and  that gave [place] good purchase, excellent length and then no sign of any  displacement or fracture.  Her fracture was anatomically aligned with no  displacement of the joint and following this, we thoroughly irrigated  all wounds and closed with layered closure using Vicryl and staples.  Sterile compressive bandage was applied followed by a short-leg splint.  The patient was taken to recovery room in stable condition.      Almedia Balls. Ranell Patrick, M.D.  Electronically Signed    SRN/MEDQ  D:  09/23/2008  T:  09/24/2008  Job:  [id number]

## 2010-11-19 ENCOUNTER — Other Ambulatory Visit (HOSPITAL_COMMUNITY): Payer: Self-pay | Admitting: Family Medicine

## 2010-11-19 DIAGNOSIS — Z1231 Encounter for screening mammogram for malignant neoplasm of breast: Secondary | ICD-10-CM

## 2010-12-06 ENCOUNTER — Ambulatory Visit (HOSPITAL_COMMUNITY): Payer: Self-pay

## 2010-12-06 ENCOUNTER — Ambulatory Visit (HOSPITAL_COMMUNITY): Payer: Self-pay | Attending: Family Medicine

## 2010-12-26 ENCOUNTER — Encounter: Payer: Self-pay | Admitting: Gastroenterology

## 2011-03-14 DIAGNOSIS — R9439 Abnormal result of other cardiovascular function study: Secondary | ICD-10-CM

## 2011-03-14 DIAGNOSIS — R079 Chest pain, unspecified: Secondary | ICD-10-CM

## 2011-03-14 HISTORY — DX: Chest pain, unspecified: R07.9

## 2011-03-14 HISTORY — DX: Abnormal result of other cardiovascular function study: R94.39

## 2011-03-26 HISTORY — PX: DOPPLER ECHOCARDIOGRAPHY: SHX263

## 2011-03-27 HISTORY — PX: NM MYOVIEW LTD: HXRAD82

## 2012-03-02 ENCOUNTER — Other Ambulatory Visit: Payer: Self-pay | Admitting: Internal Medicine

## 2012-03-02 DIAGNOSIS — B1920 Unspecified viral hepatitis C without hepatic coma: Secondary | ICD-10-CM

## 2012-03-08 ENCOUNTER — Ambulatory Visit
Admission: RE | Admit: 2012-03-08 | Discharge: 2012-03-08 | Disposition: A | Discharge status: Home / Self Care | Payer: Medicaid Other | Source: Ambulatory Visit | Attending: Internal Medicine | Admitting: Internal Medicine

## 2012-03-08 DIAGNOSIS — B1920 Unspecified viral hepatitis C without hepatic coma: Secondary | ICD-10-CM

## 2012-06-25 ENCOUNTER — Encounter: Payer: Self-pay | Admitting: *Deleted

## 2012-06-26 ENCOUNTER — Encounter: Payer: Self-pay | Admitting: Cardiology

## 2012-06-28 ENCOUNTER — Ambulatory Visit: Payer: Medicaid Other | Admitting: Cardiology

## 2012-07-12 ENCOUNTER — Ambulatory Visit: Payer: Medicaid Other | Admitting: Cardiology

## 2012-07-16 ENCOUNTER — Encounter: Payer: Self-pay | Admitting: Cardiology

## 2012-07-16 ENCOUNTER — Ambulatory Visit (INDEPENDENT_AMBULATORY_CARE_PROVIDER_SITE_OTHER): Payer: Medicare Other | Admitting: Cardiology

## 2012-07-16 VITALS — BP 120/78 | HR 60 | Ht 66.0 in | Wt 160.3 lb

## 2012-07-16 DIAGNOSIS — R079 Chest pain, unspecified: Secondary | ICD-10-CM

## 2012-07-16 DIAGNOSIS — I10 Essential (primary) hypertension: Secondary | ICD-10-CM

## 2012-07-16 DIAGNOSIS — R002 Palpitations: Secondary | ICD-10-CM

## 2012-07-16 DIAGNOSIS — R9439 Abnormal result of other cardiovascular function study: Secondary | ICD-10-CM | POA: Diagnosis not present

## 2012-07-16 NOTE — Progress Notes (Signed)
Patient ID: Christine Hurley, female   DOB: 1950/11/06, 62 y.o.   MRN: [id number]  Clinic Note: HPI: Christine Hurley is a 62 y.o. female with a PMH below who presents today for roughly one-year followup for chest discomfort, palpitations and distal insertion..  Interval History: Over last year, she stopped working. She is now retired. She is trying to become more active and walking around but she does note that maybe she walks around [employer] she'll need to stop and sit down. That seems to be more do to her right leg giving out on her as opposed to due to dyspnea. She does get dyspneic on exertion but not distended she was before. She denies any chest pressure chest tightness associated with that. No PND, orthopnea, edema, she does have mild ankle swelling of the right leg does not edematous. No melena, hematochezia, hematuria. No lightheadedness, dizziness, wooziness, CK/near-syncope symptoms. No TIA or amaurosis fugax symptoms.  Past Medical History  Diagnosis Date  . Hypertension   . Hepatitis C   . Abnormal nuclear stress test February '13    Small, partially reversible inferolateral ischemia. Marked hypertensive response to exercise. Low risk.  . Right tibial fracture    . Chest pain with minimal risk for cardiac etiology February 2013    Evaluated with echocardiogram and Myoview, do   Prior cardiac evaluation and past surgical history: Past Surgical History  Procedure Laterality Date  . Tibia fracture surgery Right 09/2008    fracture of right lower leg with open reduction and internal fixation  . Doppler echocardiography  03/26/2011    LVEF>55% normal LV wall thickness, normal LA, mild aortic sclerosis with trace to mild AI, mild to moderate TR, RVSP of , RA pressure about 5 mmHg  . Nm myoview ltd  03/27/11    post stress ejection fraction is 72%, abnormal myocardial perfusion study, this is a low risk scan    No Known Allergies  Current Outpatient Prescriptions    Medication Sig Dispense Refill  . lisinopril-hydrochlorothiazide (PRINZIDE,ZESTORETIC) 20-25 MG per tablet Take 1 tablet by mouth daily.       No current facility-administered medications for this visit.    History   Social History  . Marital Status: Divorced    Spouse Name: N/A    Number of Children: 1  . Years of Education: N/A   Occupational History  . Not on file.   Social History Main Topics  . Smoking status: Former Smoker    Types: Cigarettes    Quit date: 10/12/2010  . Smokeless tobacco: Never Used  . Alcohol Use: No  . Drug Use: No  . Sexually Active: Not on file   Other Topics Concern  . Not on file   Social History Narrative  . No narrative on file   ROS: A comprehensive Review of Systems - Negative except pertinent positives above Musculoskeletal ROS: positive for - joint pain, joint swelling, muscle pain and pain in ankle - right, foot - right and leg - right - result of her prior injury (runover by lawnmower)  PHYSICAL EXAM BP 120/78  Pulse 60  Ht 5\' 6"  (1.676 m)  Wt 160 lb 4.8 oz (72.712 kg)  BMI 25.89 kg/m2 General appearance: alert, cooperative, no distress and Pleasant mood and affect. Neck: no carotid bruit and no JVD Lungs: clear to auscultation bilaterally, normal percussion bilaterally and Nonlabored, good air movement. Heart: regular rate and rhythm, S1, S2 normal, no S3 or S4, systolic murmur: systolic ejection 1/6, crescendo  and decrescendo at 2nd right intercostal space, radiates to carotids and no rub Abdomen: soft, non-tender; bowel sounds normal; no masses,  no organomegaly Extremities: extremities normal, atraumatic, no cyanosis or edema and Well-healed surgical scar on the right leg. Pulses: 2+ and symmetric HEENT: [place]/AT/EOMI, MMM, anicteric sclera, noninjected conjunctiva  FAO:ZHYQMVHQI today: Yes Rate: 60 , Rhythm: Normal sinus, left atrial abnormality, otherwise normal ECG  ASSESSMENT: Overall very stable.  PLAN: Per problem  list. Orders Placed This Encounter  Procedures  . Lipid Profile  . Comp Met (CMET)  . EKG 12-Lead    Followup: One year  Brendalee Matthies W, M.D., M.S. [facility] [address] [place], [place]  [address]  [phone] Pager # [phone] 07/17/2012 11:35 AM

## 2012-07-16 NOTE — Patient Instructions (Addendum)
Your Blood Pressure & Heart rate seem to be doing well.  It is about time to check your cholesterol levels -- we will give you a lab slip for fasting cholesterol levels.  If the "slow heart rate" sensations start to occur with increased frequency, let us know.  We will have you wear a monitor to try to document what is happening.  We will see you back in 1 yr.  Marykay Lex, MD   Addendum:  It also likely restart your aspirin 81 mg for primary prevention of heart attack or stroke.

## 2012-07-17 ENCOUNTER — Encounter: Payer: Self-pay | Admitting: Cardiology

## 2012-07-17 DIAGNOSIS — R002 Palpitations: Secondary | ICD-10-CM | POA: Insufficient documentation

## 2012-07-17 NOTE — Assessment & Plan Note (Signed)
No longer a major concern here as far as cardiac symptoms. Recommend taking aspirin, blood pressure control. If symptoms recur, would consider beta blocker however with some of her symptoms just simply being fatigued, I decided not to use beta blockers in the past. We'll check fasting lipid panel for risk factor evaluation.

## 2012-07-17 NOTE — Assessment & Plan Note (Signed)
She says that she has these episodes were "heart slows down for low for a little bit ", however this only happens maybe once a month. If it happens more frequently, then we would consider him wear monitor to ensure that she has no significant bradycardia. This would be another reason to not use beta blocker for now.

## 2012-07-17 NOTE — Assessment & Plan Note (Signed)
Blood pressure is good today. She's not on the amlodipine that she had been on before by mouth intake and accommodation ACE inhibitor with diuretic. Checking chemistry panel all LFTs lipids.

## 2012-07-17 NOTE — Assessment & Plan Note (Signed)
Currently not having the symptoms that she was having before with minimal if any chest discomfort. She still gets tired and fatigued with being out about but denies any chest pain with it. I suspect her abnormal Myoview he may very well be due to artifact. But the best plan for now the medical therapy. I would like for her to take an aspirin daily along with the ACE inhibitor.

## 2013-04-15 ENCOUNTER — Institutional Professional Consult (permissible substitution): Payer: Medicaid Other | Admitting: Emergency Medicine

## 2013-05-03 ENCOUNTER — Institutional Professional Consult (permissible substitution): Payer: Medicare Other | Admitting: Emergency Medicine

## 2013-05-17 ENCOUNTER — Emergency Department (HOSPITAL_COMMUNITY)
Admission: EM | Admit: 2013-05-17 | Discharge: 2013-05-17 | Disposition: A | Discharge status: Home / Self Care | Payer: Medicare Other | Attending: Emergency Medicine | Admitting: Emergency Medicine

## 2013-05-17 ENCOUNTER — Encounter (HOSPITAL_COMMUNITY): Payer: Self-pay | Admitting: Emergency Medicine

## 2013-05-17 DIAGNOSIS — Z79899 Other long term (current) drug therapy: Secondary | ICD-10-CM | POA: Insufficient documentation

## 2013-05-17 DIAGNOSIS — K029 Dental caries, unspecified: Secondary | ICD-10-CM | POA: Diagnosis not present

## 2013-05-17 DIAGNOSIS — Z87891 Personal history of nicotine dependence: Secondary | ICD-10-CM | POA: Diagnosis not present

## 2013-05-17 DIAGNOSIS — K089 Disorder of teeth and supporting structures, unspecified: Secondary | ICD-10-CM | POA: Diagnosis present

## 2013-05-17 DIAGNOSIS — Z8619 Personal history of other infectious and parasitic diseases: Secondary | ICD-10-CM | POA: Diagnosis not present

## 2013-05-17 DIAGNOSIS — Z792 Long term (current) use of antibiotics: Secondary | ICD-10-CM | POA: Diagnosis not present

## 2013-05-17 DIAGNOSIS — Z8781 Personal history of (healed) traumatic fracture: Secondary | ICD-10-CM | POA: Insufficient documentation

## 2013-05-17 DIAGNOSIS — I10 Essential (primary) hypertension: Secondary | ICD-10-CM | POA: Insufficient documentation

## 2013-05-17 DIAGNOSIS — K0889 Other specified disorders of teeth and supporting structures: Secondary | ICD-10-CM

## 2013-05-17 MED ORDER — HYDROCODONE-HOMATROPINE 5-1.5 MG/5ML PO SYRP
5.0000 mL | ORAL_SOLUTION | Freq: Once | ORAL | Status: AC
  Administered 2013-05-17: 5 mL via ORAL
  Filled 2013-05-17: qty 5

## 2013-05-17 MED ORDER — PENICILLIN V POTASSIUM 500 MG PO TABS: 500.0000 mg | ORAL_TABLET | Freq: Four times a day (QID) | ORAL | Status: DC

## 2013-05-17 MED ORDER — HYDROCODONE-IBUPROFEN 7.5-200 MG PO TABS: 1.0000 | ORAL_TABLET | Freq: Four times a day (QID) | ORAL | Status: DC | PRN

## 2013-05-17 MED ORDER — LIDOCAINE VISCOUS 2 % MT SOLN: 20.0000 mL | OROMUCOSAL | Status: DC | PRN

## 2013-05-17 MED ORDER — LIDOCAINE VISCOUS 2 % MT SOLN
15.0000 mL | Freq: Once | OROMUCOSAL | Status: AC
  Administered 2013-05-17: 15 mL via OROMUCOSAL
  Filled 2013-05-17: qty 15

## 2013-05-17 NOTE — ED Provider Notes (Signed)
CSN: [id number]     Arrival date & time 05/17/13  2585 History   First MD Initiated Contact with Patient 05/17/13 [id number]     Chief Complaint  Patient presents with  . Allergic Reaction     (Consider location/radiation/quality/duration/timing/severity/associated sxs/prior Treatment) HPI Comments: Patient is a 63 year old female past medical history significant for hypertension, hepatitis C presented to the emergency department for right upper dental pain. Patient states she was eating butter pecan ice cream on Friday without her dentures and developed pain to her remaining upper tooth. Patient states since then she has had a severe sharp pain with radiation to right side of face and associated mild gum swelling. She said she's tried Motrin without relief of her symptoms. She denies any oropharyngeal swelling, difficulty breathing, difficulty swallowing, nausea, vomiting. Patient does have a dentist.  Patient is a 63 y.o. female presenting with allergic reaction.  Allergic Reaction Presenting symptoms: no difficulty swallowing and no wheezing     Past Medical History  Diagnosis Date  . Hypertension   . Hepatitis C   . Abnormal nuclear stress test February '13    Small, partially reversible inferolateral ischemia. Marked hypertensive response to exercise. Low risk.  . Right tibial fracture    . Chest pain with minimal risk for cardiac etiology February 2013    Evaluated with echocardiogram and Myoview, do   Past Surgical History  Procedure Laterality Date  . Tibia fracture surgery Right 09/2008    fracture of right lower leg with open reduction and internal fixation  . Doppler echocardiography  03/26/2011    LVEF>55% normal LV wall thickness, normal LA, mild aortic sclerosis with trace to mild AI, mild to moderate TR, RVSP of 21mmHg, RA pressure about 5 mmHg  . Nm myoview ltd  03/27/11    post stress ejection fraction is 72%, abnormal myocardial perfusion study, this is a low risk scan    No family history on file. History  Substance Use Topics  . Smoking status: Former Smoker    Types: Cigarettes    Quit date: 10/12/2010  . Smokeless tobacco: Never Used  . Alcohol Use: No   OB History   Grav Para Term Preterm Abortions TAB SAB Ect Mult Living                 Review of Systems  Constitutional: Negative for fever and chills.  HENT: Positive for dental problem and facial swelling. Negative for sinus pressure, sore throat, trouble swallowing and voice change.   Respiratory: Negative for shortness of breath, wheezing and stridor.   Cardiovascular: Negative for chest pain.  All other systems reviewed and are negative.      Allergies  Review of patient's allergies indicates no known allergies.  Home Medications   Current Outpatient Rx  Name  Route  Sig  Dispense  Refill  . HYDROcodone-ibuprofen (VICOPROFEN) 7.5-200 MG per tablet   Oral   Take 1 tablet by mouth every 6 (six) hours as needed for moderate pain.   30 tablet   0   . lidocaine (XYLOCAINE) 2 % solution   Mouth/Throat   Use as directed 20 mLs in the mouth or throat as needed for mouth pain.   100 mL   0   . lisinopril-hydrochlorothiazide (PRINZIDE,ZESTORETIC) 20-25 MG per tablet   Oral   Take 1 tablet by mouth daily.         . penicillin v potassium (VEETID) 500 MG tablet   Oral   Take 1  tablet (500 mg total) by mouth 4 (four) times daily.   40 tablet   0    BP 104/98  Pulse 97  Temp(Src) 98.4 F (36.9 C) (Oral)  Resp 14  Ht 5\' 6"  (1.676 m)  Wt 155 lb (70.308 kg)  BMI 25.03 kg/m2  SpO2 100% Physical Exam  Nursing note and vitals reviewed. Constitutional: She is oriented to person, place, and time. She appears well-developed and well-nourished. No distress.  HENT:  Head: Normocephalic and atraumatic.  Right Ear: External ear normal.  Left Ear: External ear normal.  Nose: Nose normal.  Mouth/Throat: Uvula is midline, oropharynx is clear and moist and mucous membranes are  normal. No oral lesions. No trismus in the jaw. Abnormal dentition. Dental caries present. No dental abscesses or uvula swelling. No oropharyngeal exudate.    No gross dental abscess.  Tooth is TTP.   Eyes: Conjunctivae are normal.  Neck: Normal range of motion. Neck supple.  Cardiovascular: Normal rate, regular rhythm and normal heart sounds.   Pulmonary/Chest: Effort normal and breath sounds normal. No respiratory distress.  Neurological: She is alert and oriented to person, place, and time.  Skin: Skin is warm and dry. She is not diaphoretic.  Psychiatric: She has a normal mood and affect.    ED Course  Procedures (including critical care time) Medications  lidocaine (XYLOCAINE) 2 % viscous mouth solution 15 mL (15 mLs Mouth/Throat Given 05/17/13 0716)  HYDROcodone-homatropine (HYCODAN) 5-1.5 MG/5ML syrup 5 mL (5 mLs Oral Given 05/17/13 0716)    Labs Review Labs Reviewed - No data to display Imaging Review No results found.   EKG Interpretation None      MDM   Final diagnoses:  Pain, dental    Filed Vitals:   05/17/13 0547  BP: 104/98  Pulse: 97  Temp: 98.4 F (36.9 C)  Resp: 14   Afebrile, NAD, non-toxic appearing, AAOx4. No gross facial swelling. No posterior oropharyngeal edema. No lip or tongue swelling. No respiratory distress. No stridor or wheezing.   Patient with toothache.  No gross abscess.  Exam unconcerning for Ludwig's angina or spread of infection.  Will treat with penicillin and pain medicine.  Urged patient to follow-up with dentist. Return precautions discussed. Patient is agreeable to plan. Patient is stable at time of discharge       Harlow Mares, PA-C 05/17/13 8088

## 2013-05-17 NOTE — ED Notes (Signed)
Pt presents with swelling to right side of face between right nasal fold up to eye, down to jaw. Painful to smile, attempts to lift lip to look at gums painful as well. Swelling not noticed to gum area. Is unclear why but woke her every two hours last night.  No dental caries visible, most teeth are missing and pt left her false teeth at home.

## 2013-05-17 NOTE — ED Notes (Signed)
Pt. reports right facial itch / swelling onset last Friday after eating Butter Pecan Ice Cream , respirations unlabored / airway intact .

## 2013-05-17 NOTE — Discharge Instructions (Signed)
Please follow up with your primary care physician in 1-2 days. If you do not have one please call the [facility] number listed above. Please follow up with dentist to schedule a follow up appointment. Please take your antibiotic until completion. Please take pain medication and/or muscle relaxants as prescribed and as needed for pain. Please do not drive on narcotic pain medication or on muscle relaxants. Please read all discharge instructions and return precautions.      Dental Pain A tooth ache may be caused by cavities (tooth decay). Cavities expose the nerve of the tooth to air and hot or cold temperatures. It may come from an infection or abscess (also called a boil or furuncle) around your tooth. It is also often caused by dental caries (tooth decay). This causes the pain you are having. DIAGNOSIS  Your caregiver can diagnose this problem by exam. TREATMENT   If caused by an infection, it may be treated with medications which kill germs (antibiotics) and pain medications as prescribed by your caregiver. Take medications as directed.  Only take over-the-counter or prescription medicines for pain, discomfort, or fever as directed by your caregiver.  Whether the tooth ache today is caused by infection or dental disease, you should see your dentist as soon as possible for further care. SEEK MEDICAL CARE IF: The exam and treatment you received today has been provided on an emergency basis only. This is not a substitute for complete medical or dental care. If your problem worsens or new problems (symptoms) appear, and you are unable to meet with your dentist, call or return to this location. SEEK IMMEDIATE MEDICAL CARE IF:   You have a fever.  You develop redness and swelling of your face, jaw, or neck.  You are unable to open your mouth.  You have severe pain uncontrolled by pain medicine. MAKE SURE YOU:   Understand these instructions.  Will watch your  condition.  Will get help right away if you are not doing well or get worse. Document Released: 01/27/2005 Document Revised: 04/21/2011 Document Reviewed: 09/15/2007 [facility] Patient Information 2014 [place].  RESOURCE GUIDE  If you do not have a primary care doctor to follow up with regarding today's visit, please call the [facility] at [phone] to make an appointment. Hours of operation are 10am - 7pm, Monday through Friday, and they have a sliding scale fee.    Dental Assistance Please contact the on-call dentist listed on your discharge papers WITHIN 48 HOURS.  If the on-call dentist agrees that your condition is emergent, there is a CHANCE (not a guarantee) that you MAY receive a savings on your visit at this point. If you wait more than 48 hours, it will not be considered an emergency.   [facility]rs[facility]:  [address], [place], [place], [address], [place]  Short-notice availability  Tooth evaluation: $100  Emergency Treatment: $200 (including exam, xrays, tooth extraction, and post-op visit)  Patients with Medicaid: [place] [id number], [place] [address], [phone]  If unable to pay, or uninsured, contact [facility] [phone] in [place], [place] in [place]) to become qualified for the adult dental clinic  Other [place]: [place], [place], [place], [address], [place], [place], 2nd and 4th Thursday of the month at 6:30am.  10 clients each day by appointment, can sometimes see walk-in patients if someone  does not show for an appointment. [facility]:  [address]er [place], [place], [address], [facility]:  [address], [place], [place], [address], [place] Department:  [place] Department:   [place] Department:  [phone]

## 2013-05-18 ENCOUNTER — Encounter (HOSPITAL_COMMUNITY): Payer: Self-pay | Admitting: Emergency Medicine

## 2013-05-18 ENCOUNTER — Emergency Department (HOSPITAL_COMMUNITY)
Admission: EM | Admit: 2013-05-18 | Discharge: 2013-05-19 | Disposition: A | Discharge status: Home / Self Care | Payer: Medicare Other | Attending: Emergency Medicine | Admitting: Emergency Medicine

## 2013-05-18 ENCOUNTER — Emergency Department (HOSPITAL_COMMUNITY): Payer: Medicare Other

## 2013-05-18 DIAGNOSIS — I10 Essential (primary) hypertension: Secondary | ICD-10-CM | POA: Diagnosis not present

## 2013-05-18 DIAGNOSIS — K029 Dental caries, unspecified: Secondary | ICD-10-CM | POA: Insufficient documentation

## 2013-05-18 DIAGNOSIS — R0989 Other specified symptoms and signs involving the circulatory and respiratory systems: Principal | ICD-10-CM | POA: Insufficient documentation

## 2013-05-18 DIAGNOSIS — Z8781 Personal history of (healed) traumatic fracture: Secondary | ICD-10-CM | POA: Insufficient documentation

## 2013-05-18 DIAGNOSIS — R1110 Vomiting, unspecified: Secondary | ICD-10-CM | POA: Insufficient documentation

## 2013-05-18 DIAGNOSIS — R0602 Shortness of breath: Secondary | ICD-10-CM | POA: Diagnosis present

## 2013-05-18 DIAGNOSIS — Z87891 Personal history of nicotine dependence: Secondary | ICD-10-CM | POA: Diagnosis not present

## 2013-05-18 DIAGNOSIS — K047 Periapical abscess without sinus: Secondary | ICD-10-CM

## 2013-05-18 DIAGNOSIS — R0609 Other forms of dyspnea: Secondary | ICD-10-CM | POA: Diagnosis not present

## 2013-05-18 DIAGNOSIS — Z79899 Other long term (current) drug therapy: Secondary | ICD-10-CM | POA: Insufficient documentation

## 2013-05-18 DIAGNOSIS — R0600 Dyspnea, unspecified: Secondary | ICD-10-CM

## 2013-05-18 DIAGNOSIS — J3489 Other specified disorders of nose and nasal sinuses: Secondary | ICD-10-CM | POA: Insufficient documentation

## 2013-05-18 DIAGNOSIS — Z8619 Personal history of other infectious and parasitic diseases: Secondary | ICD-10-CM | POA: Diagnosis not present

## 2013-05-18 NOTE — ED Notes (Signed)
Patient transported to X-ray 

## 2013-05-18 NOTE — ED Notes (Signed)
Pt c/o shortness of breath while talking. Pt was seen her Tuesday for dental pain. Pt reports an intolerance to the medication she was prescribed. Pt reports emesis x 2 today. Last dose of medication was around 3 pm today. Pt reports taking medication without food. Pt reports difficulty breathing through nose and mouth. Pt currently breathing through nose without any difficulty though a surgical mask, SpO2 100% on RA. Pt states she smokes a pack every 2 days. Pt is A&Ox4, respirations equal and unlabored, skin warm and dry.

## 2013-05-19 NOTE — Discharge Instructions (Signed)
Call for a follow up appointment with a Family or Primary Care Provider. Call a dentist for further evaluation of your dental abscess.  Return if Symptoms worsen.   Take medication as prescribed, continue taking penicillin.  Make sure you are eating when taking your pain medication.  Try and press and get as much pus/ infection out of your mouth as you can.

## 2013-05-19 NOTE — ED Provider Notes (Signed)
CSN: [id number]     Arrival date & time 05/18/13  1917 History   First MD Initiated Contact with Patient 05/18/13 2334     Chief Complaint  Patient presents with  . Shortness of Breath  . Emesis  . Nasal Congestion     (Consider location/radiation/quality/duration/timing/severity/associated sxs/prior Treatment) HPI Comments: Christine Hurley is a 63 y.o. female with a past medical history of dental abscess, HTN  presenting the Emergency Department with a chief complaint of 2 episodes of shortness of breath prior to arrival.  She reports increase in dyspnea with talking during a nonexertional or emotional conversation. She reports family member telling her to calm down and to focus on breathing.  She reports this resolved the dyspnea.  She reports another episode while in triage today, resolved.  The patient reports questionable anxiety. She reports compliance with penicillin from her visit for dental abscess yesterday.  Denies chest pain, palpitations, cough, lower extremity swelling, wheezing, hives, rash. She reports an increase in dental pain with facial swelling.  Denies fever.   Patient is a 63 y.o. female presenting with shortness of breath and vomiting. The history is provided by the patient and medical records. No language interpreter was used.  Shortness of Breath Associated symptoms: vomiting   Associated symptoms: no chest pain, no cough, no fever, no rash and no wheezing   Emesis Associated symptoms: no chills     Past Medical History  Diagnosis Date  . Hypertension   . Hepatitis C   . Abnormal nuclear stress test February '13    Small, partially reversible inferolateral ischemia. Marked hypertensive response to exercise. Low risk.  . Right tibial fracture    . Chest pain with minimal risk for cardiac etiology February 2013    Evaluated with echocardiogram and Myoview, do   Past Surgical History  Procedure Laterality Date  . Tibia fracture surgery Right 09/2008   fracture of right lower leg with open reduction and internal fixation  . Doppler echocardiography  03/26/2011    LVEF>55% normal LV wall thickness, normal LA, mild aortic sclerosis with trace to mild AI, mild to moderate TR, RVSP of 19mmHg, RA pressure about 5 mmHg  . Nm myoview ltd  03/27/11    post stress ejection fraction is 72%, abnormal myocardial perfusion study, this is a low risk scan   History reviewed. No pertinent family history. History  Substance Use Topics  . Smoking status: Former Smoker    Types: Cigarettes    Quit date: 10/12/2010  . Smokeless tobacco: Never Used  . Alcohol Use: No   OB History   Grav Para Term Preterm Abortions TAB SAB Ect Mult Living                 Review of Systems  Constitutional: Negative for fever and chills.  Respiratory: Positive for shortness of breath. Negative for cough and wheezing.   Cardiovascular: Negative for chest pain, palpitations and leg swelling.  Gastrointestinal: Positive for vomiting. Negative for nausea.  Skin: Negative for rash.      Allergies  Review of patient's allergies indicates no known allergies.  Home Medications   Current Outpatient Rx  Name  Route  Sig  Dispense  Refill  . lisinopril-hydrochlorothiazide (PRINZIDE,ZESTORETIC) 20-25 MG per tablet   Oral   Take 1 tablet by mouth daily.          BP 95/69  Pulse 79  Temp(Src) 99.2 F (37.3 C) (Oral)  Resp 18  Ht 5\' 6"  (1.676  m)  Wt 153 lb 11.2 oz (69.718 kg)  BMI 24.82 kg/m2  SpO2 100% Physical Exam  Nursing note and vitals reviewed. Constitutional: She is oriented to person, place, and time. She appears well-developed and well-nourished. No distress.  HENT:  Head: Normocephalic and atraumatic.  Mouth/Throat: Uvula is midline and oropharynx is clear and moist. No trismus in the jaw. Abnormal dentition. Dental abscesses and dental caries present. No uvula swelling. No oropharyngeal exudate.    Airway patent. Posterior Oropharynx clear. Able  to handle secretions.  Multiple missing teeth.  Multiple dental caries in reaming teeth. Dental abscess to the gum of where tooth #8 was. Fluctuation noted.  Eyes: EOM are normal.  Neck: Neck supple.  Cardiovascular: Normal rate, regular rhythm and normal heart sounds.   No lower extremity edema  Pulmonary/Chest: Effort normal and breath sounds normal. No respiratory distress. She has no wheezes. She has no rales. She exhibits no tenderness.  Patient is able to speak in complete sentences.   Abdominal: Soft. There is no tenderness. There is no rebound and no guarding.  Musculoskeletal: Normal range of motion.  Neurological: She is alert and oriented to person, place, and time.  Skin: Skin is warm and dry. No rash noted. Rash is not urticarial. She is not diaphoretic.  Psychiatric: She has a normal mood and affect. Her behavior is normal.    ED Course  INCISION AND DRAINAGE Date/Time: 05/19/2013 12:23 AM Performed by: Lorrine Kin Authorized by: Lorrine Kin Consent: Verbal consent obtained. Risks and benefits: risks, benefits and alternatives were discussed Consent given by: patient Patient understanding: patient states understanding of the procedure being performed Patient consent: the patient's understanding of the procedure matches consent given Procedure consent: procedure consent matches procedure scheduled Required items: required blood products, implants, devices, and special equipment available Patient identity confirmed: verbally with patient and arm band Time out: Immediately prior to procedure a "time out" was called to verify the correct patient, procedure, equipment, support staff and site/side marked as required. Type: abscess Body area: mouth (tooth #8 gum) Anesthesia: nerve block (Infraorbital) Local anesthetic: bupivacaine 0.5% with epinephrine Anesthetic total: 3 ml Patient sedated: no Scalpel size: 11 Needle gauge: 22 Incision type: single  straight Complexity: complex Drainage: purulent and  bloody Drainage amount: moderate Wound treatment: wound left open Packing material: none Patient tolerance: Patient tolerated the procedure well with no immediate complications.   (including critical care time) Labs Review Labs Reviewed - No data to display Imaging Review Dg Chest 2 View  05/18/2013   CLINICAL DATA:  Shortness of breath.  Emesis.  EXAM: CHEST  2 VIEW  COMPARISON:  None.  FINDINGS: Normal heart size the aortic tortuosity. No acute infiltrate or edema. No effusion or pneumothorax. Concavity in the ventral cortex of the upper sternal body is presumably chronic given the history. No acute osseous findings.  IMPRESSION: No active cardiopulmonary disease.   Electronically Signed   By: Jorje Guild M.D.   On: 05/18/2013 21:17     EKG Interpretation None      MDM   Final diagnoses:  Dental abscess  Dyspnea   Pt with episodic dyspnea. Asymptomatic at this time. Oxygen saturation 100% RA, no signs of distress. XR without acute findings.  No signs of allergic reaction to medication.   Dental abscess drained. Will have the patient continue ABx and f/u with dentist. Discussed lab results, imaging results, and treatment plan with the patient. Return precautions given. Reports understanding and no other  concerns at this time.  Patient is stable for discharge at this time.  Meds given in ED:  Medications - No data to display  Discharge Medication List as of 05/19/2013 12:27 AM          Lorrine Kin, PA-C 05/20/13 1635

## 2013-05-20 ENCOUNTER — Institutional Professional Consult (permissible substitution): Payer: Self-pay | Admitting: Emergency Medicine

## 2013-05-21 NOTE — ED Provider Notes (Signed)
Medical screening examination/treatment/procedure(s) were performed by non-physician practitioner and as supervising physician I was immediately available for consultation/collaboration.    Teressa Lower, MD 05/21/13 [phone]

## 2013-05-23 NOTE — ED Provider Notes (Signed)
Medical screening examination/treatment/procedure(s) were performed by non-physician practitioner and as supervising physician I was immediately available for consultation/collaboration.   EKG Interpretation None        Elyn Peers, MD 05/23/13 [phone]

## 2013-05-26 DIAGNOSIS — I10 Essential (primary) hypertension: Secondary | ICD-10-CM | POA: Insufficient documentation

## 2013-06-01 ENCOUNTER — Encounter: Payer: Self-pay | Admitting: Emergency Medicine

## 2013-06-01 ENCOUNTER — Ambulatory Visit (INDEPENDENT_AMBULATORY_CARE_PROVIDER_SITE_OTHER): Payer: Medicare Other | Admitting: Emergency Medicine

## 2013-06-01 VITALS — BP 100/70 | HR 64 | Ht 66.0 in | Wt 155.6 lb

## 2013-06-01 DIAGNOSIS — R0989 Other specified symptoms and signs involving the circulatory and respiratory systems: Secondary | ICD-10-CM

## 2013-06-01 DIAGNOSIS — R0609 Other forms of dyspnea: Secondary | ICD-10-CM

## 2013-06-01 NOTE — Addendum Note (Signed)
Addended by: Carlos American A on: 06/01/2013 02:27 PM   Modules accepted: Orders

## 2013-06-01 NOTE — Patient Instructions (Signed)
We will set up full pulmonary function testing at your next office visit Walking oximetry today We will arrange for you to see Dr Ellyn Hack to discuss the symptoms you have been experiencing Follow with Dr Lamonte Sakai next available with full PFT

## 2013-06-01 NOTE — Assessment & Plan Note (Signed)
Suspect more insidious onset, worst over last 3 months. Probable COPD component here. She has seen Dr Ellyn Hack before - low risk for CAD/angina but needs re-eval.  - continue Advair for now - full PFt - walking oximetry - referral to see Dr Ellyn Hack

## 2013-06-01 NOTE — Progress Notes (Signed)
Subjective:    Patient ID: Christine Hurley, female    DOB: May 08, 1950, 63 y.o.   MRN: [id number]  HPI 63 yo former smoker (10 pk-yrs), HTN, Hep C, Chest pain s/p low-risk cards eval in 2/13.  Also w hx R LE trauma and fx. Referred by Sammuel Bailiff for evaluation of dyspnea. She quit smoking about 3 months ago. Up until 3 months ago her breathing had been good. She began to notice exertional SOB about that time. She has had chest tightness and pain on 3 of these occasions but not all. She had spirometry done in her PCP office in the past. She was given Advair 3 weeks ago, has been using qam. She has heard some wheezing when sitting still. She has some cough, sore throat, non-productive.    Review of Systems  Constitutional: Positive for unexpected weight change. Negative for fever.  HENT: Positive for dental problem. Negative for congestion, ear pain, nosebleeds, postnasal drip, rhinorrhea, sinus pressure, sneezing, sore throat and trouble swallowing.   Eyes: Negative for redness and itching.  Respiratory: Positive for shortness of breath. Negative for cough, chest tightness and wheezing.   Cardiovascular: Positive for chest pain, palpitations and leg swelling.       Foot swelling  Gastrointestinal: Negative for nausea and vomiting.  Genitourinary: Negative for dysuria.  Musculoskeletal: Negative for joint swelling.  Skin: Negative for rash.  Neurological: Negative for headaches.  Hematological: Does not bruise/bleed easily.  Psychiatric/Behavioral: Positive for dysphoric mood. The patient is nervous/anxious.    Past Medical History  Diagnosis Date  . Hypertension   . Hepatitis C   . Abnormal nuclear stress test February '13    Small, partially reversible inferolateral ischemia. Marked hypertensive response to exercise. Low risk.  . Right tibial fracture    . Chest pain with minimal risk for cardiac etiology February 2013    Evaluated with echocardiogram and Myoview, do     Family  History  Problem Relation Age of Onset  . Asthma Mother   . Cancer Mother     Brain  . Cancer - Prostate Father      History   Social History  . Marital Status: Divorced    Spouse Name: N/A    Number of Children: 1  . Years of Education: N/A   Occupational History  . Not on file.   Social History Main Topics  . Smoking status: Former Smoker -- 0.25 packs/day for 35 years    Types: Cigarettes    Quit date: 09/10/2012  . Smokeless tobacco: Never Used  . Alcohol Use: No  . Drug Use: No  . Sexual Activity: Not on file   Other Topics Concern  . Not on file   Social History Narrative  . No narrative on file     No Known Allergies   Outpatient Prescriptions Prior to Visit  Medication Sig Dispense Refill  . lisinopril-hydrochlorothiazide (PRINZIDE,ZESTORETIC) 20-25 MG per tablet Take 1 tablet by mouth daily.       No facility-administered medications prior to visit.       Objective:   Physical Exam Filed Vitals:   06/01/13 1352  BP: 100/70  Pulse: 64  Height: 5\' 6"  (1.676 m)  Weight: 155 lb 9.6 oz (70.58 kg)  SpO2: 95%   Gen: Pleasant, well-nourished, in no distress,  normal affect  ENT: No lesions,  mouth clear,  oropharynx clear, no postnasal drip  Neck: No JVD, no TMG, no carotid bruits  Lungs: No use of  accessory muscles, no dullness to percussion, clear without rales or rhonchi  Cardiovascular: RRR, heart sounds normal, no murmur or gallops, no peripheral edema  Abdomen: soft and NT, no HSM,  BS normal  Musculoskeletal: No deformities, no cyanosis or clubbing  Neuro: alert, non focal  Skin: Warm, no lesions or rashes     Assessment & Plan:  Dyspnea on exertion Suspect more insidious onset, worst over last 3 months. Probable COPD component here. She has seen Dr Ellyn Hack before - low risk for CAD/angina but needs re-eval.  - continue Advair for now - full PFt - walking oximetry - referral to see Dr Ellyn Hack

## 2013-06-03 ENCOUNTER — Ambulatory Visit: Payer: Self-pay | Admitting: Cardiology

## 2013-06-03 ENCOUNTER — Ambulatory Visit (INDEPENDENT_AMBULATORY_CARE_PROVIDER_SITE_OTHER): Payer: Medicare Other | Admitting: Cardiology

## 2013-06-03 VITALS — BP 110/62 | HR 55 | Ht 66.0 in | Wt 155.0 lb

## 2013-06-03 DIAGNOSIS — R0609 Other forms of dyspnea: Secondary | ICD-10-CM

## 2013-06-03 DIAGNOSIS — R0989 Other specified symptoms and signs involving the circulatory and respiratory systems: Secondary | ICD-10-CM

## 2013-06-03 DIAGNOSIS — I10 Essential (primary) hypertension: Secondary | ICD-10-CM

## 2013-06-03 DIAGNOSIS — R0600 Dyspnea, unspecified: Secondary | ICD-10-CM

## 2013-06-03 NOTE — Patient Instructions (Signed)
Your physician recommends that you schedule a follow-up appointment  If you have any further problems or as needed  We are going to schedule a 2-d-Echo

## 2013-06-06 ENCOUNTER — Encounter: Payer: Self-pay | Admitting: Cardiology

## 2013-06-06 DIAGNOSIS — I10 Essential (primary) hypertension: Secondary | ICD-10-CM | POA: Insufficient documentation

## 2013-06-06 NOTE — Assessment & Plan Note (Signed)
COPD is a likely contributor for her symptoms, in the setting of prior tobacco use, however will assess for potential cardiac etiologies with a 2D echo. Will assess systolic and diastolic function, valvular anatomy and will r/o effusion. In the absences of chest pain symptoms concerning for angina, will not order repeat NST at this time. If 2D echo shows any reduction in systolic function or if any WMA are demonstrated, then will need to pursue an ischemic eval. Dr. Lamonte Sakai has ordered PFTs to assess for pulmonary etiologies.

## 2013-06-06 NOTE — Progress Notes (Signed)
Patient ID: Christine Hurley, female   DOB: 06/18/1950, 63 y.o.   MRN: [id number]    06/06/2013 Christine Hurley   05-Feb-1951  [id number]  Primary Physicia Berkley Harvey, NP Primary Cardiologist: Dr. Ellyn Hack  HPI:  Christine Hurley is a 63 y/o female, who has been referred back to our office, by Dr. Lamonte Sakai, pulmonologist, for evaluation for dyspnea. She is followed by Dr. Ellyn Hack. In Feb. 2013, she underwent a cardiac work-up for chest pain. She underwent a nuclear stress test, which was a low risk study. 2D echo at that time revealed normal left ventricular systolic function, with an  EF of >55%.  There was mild aortic sclerosis with trace to mild AI and mild to moderate TR.  Her other significant cardiac risk factors include HTN and a past 10+ year history of tobacco abuse, having quit ~6 months ago.  She was seen recently by Dr. Lamonte Sakai, for evaluation for exertional dyspnea. He felt that a probably COPD component was contributing, however he suggest that she be further evaluated for cardiac etiologies.  She presents to clinic today with a complaint of DOE that has seemed to progressively worsened over the last 3 months.  As mentioned above, she quit smoking about 6 months ago and denies any further tobacco use since then. She denies resting dyspnea. Her symptoms resolve after resting. She denies orthopnea, PND, LEE and weight gain. She does note about 3 episodes of chest tightness over the last several months that woke her from her sleep. However, each episode was quickly relived after using her inhaler. She denies any other occurrences of chest pain. She states that she often walks her two dogs and she mows her own lawn, using a push mower, and she denies any exertional chest pain with these activities. She also denies any episodes of dizziness, syncope, near syncope, palpitations, diaphoresis, n/v.     Current Outpatient Prescriptions  Medication Sig Dispense Refill  . amLODipine (NORVASC) 10 MG tablet  1 tablet daily.      [name] escitalopram (LEXAPRO) 5 MG tablet 1 tablet daily.      . hydrOXYzine (ATARAX/VISTARIL) 25 MG tablet 1 tablet 2 (two) times daily as needed.      [name] lisinopril-hydrochlorothiazide (PRINZIDE,ZESTORETIC) 20-25 MG per tablet Take 1 tablet by mouth daily.      . penicillin v potassium (VEETID) 500 MG tablet 1 tablet daily.      [name] PROAIR HFA 108 (90 BASE) MCG/ACT inhaler 1 puff every 6 (six) hours as needed.       No current facility-administered medications for this visit.    No Known Allergies  History   Social History  . Marital Status: Divorced    Spouse Name: N/A    Number of Children: 1  . Years of Education: N/A   Occupational History  . Not on file.   Social History Main Topics  . Smoking status: Former Smoker -- 0.25 packs/day for 35 years    Types: Cigarettes    Quit date: 09/10/2012  . Smokeless tobacco: Never Used  . Alcohol Use: No  . Drug Use: No  . Sexual Activity: Not on file   Other Topics Concern  . Not on file   Social History Narrative  . No narrative on file     Review of Systems: General: negative for chills, fever, night sweats or weight changes.  Cardiovascular: negative for chest pain, dyspnea on exertion, edema, orthopnea, palpitations, paroxysmal nocturnal dyspnea or shortness of breath Dermatological: negative for  rash Respiratory: negative for cough or wheezing Urologic: negative for hematuria Abdominal: negative for nausea, vomiting, diarrhea, bright red blood per rectum, melena, or hematemesis Neurologic: negative for visual changes, syncope, or dizziness All other systems reviewed and are otherwise negative except as noted above.    Blood pressure 110/62, pulse 55, height 5\' 6"  (1.676 m), weight 155 lb (70.308 kg).  General appearance: alert, cooperative and no distress Neck: no carotid bruit and no JVD Lungs: clear to auscultation bilaterally Heart: regular rate and rhythm, S1, S2 normal, no murmur, click, rub or  gallop Extremities: no LEE Pulses: 2+ and symmetric Skin: warm and dry Neurologic: Grossly normal  EKG sinus brady, 55pbm  ASSESSMENT AND PLAN:   Dyspnea on exertion COPD is a likely contributor for her symptoms, in the setting of prior tobacco use, however will assess for potential cardiac etiologies with a 2D echo. Will assess systolic and diastolic function, valvular anatomy and will r/o effusion. In the absences of chest pain symptoms concerning for angina, will not order repeat NST at this time. If 2D echo shows any reduction in systolic function or if any WMA are demonstrated, then will need to pursue an ischemic eval. Dr. Lamonte Sakai has ordered PFTs to assess for pulmonary etiologies.   Hypertension Well controlled today at 110/62. Continue amlodipine, lisinopril and HCTZ.     PLAN  Will order a 2D echo to see if dyspnea is due to any cardiac dysfunction.  Will have patient f/u if abnormal. If normal, I suspect her problems may be mainly due to possible COPD. Recommend PFTs to further assess.  If echo is normal, then patient will need to f/u with Dr. Ellyn Hack at time of yearly cardiac eval.   Brittainy SimmonsPA-C 06/06/2013 11:41 AM

## 2013-06-06 NOTE — Assessment & Plan Note (Signed)
Well controlled today at [place]. Continue amlodipine, lisinopril and HCTZ.

## 2013-06-14 ENCOUNTER — Ambulatory Visit (HOSPITAL_COMMUNITY)
Admission: RE | Admit: 2013-06-14 | Discharge: 2013-06-14 | Disposition: A | Discharge status: Home / Self Care | Payer: Medicare Other | Source: Ambulatory Visit | Attending: Cardiovascular Disease | Admitting: Cardiovascular Disease

## 2013-06-14 DIAGNOSIS — R0609 Other forms of dyspnea: Secondary | ICD-10-CM | POA: Diagnosis present

## 2013-06-14 DIAGNOSIS — R002 Palpitations: Secondary | ICD-10-CM | POA: Diagnosis not present

## 2013-06-14 DIAGNOSIS — I10 Essential (primary) hypertension: Secondary | ICD-10-CM | POA: Diagnosis not present

## 2013-06-14 DIAGNOSIS — R0600 Dyspnea, unspecified: Secondary | ICD-10-CM

## 2013-06-14 DIAGNOSIS — R079 Chest pain, unspecified: Secondary | ICD-10-CM | POA: Diagnosis not present

## 2013-06-14 DIAGNOSIS — Z87891 Personal history of nicotine dependence: Secondary | ICD-10-CM | POA: Insufficient documentation

## 2013-06-14 DIAGNOSIS — R0989 Other specified symptoms and signs involving the circulatory and respiratory systems: Secondary | ICD-10-CM | POA: Diagnosis present

## 2013-06-14 NOTE — Progress Notes (Signed)
2D Echocardiogram Complete.  06/14/2013   Amarrah Meinhart, RDCS 

## 2013-07-01 ENCOUNTER — Ambulatory Visit (HOSPITAL_COMMUNITY)
Admission: RE | Admit: 2013-07-01 | Discharge: 2013-07-01 | Disposition: A | Discharge status: Home / Self Care | Payer: Medicare Other | Source: Ambulatory Visit | Attending: Emergency Medicine | Admitting: Emergency Medicine

## 2013-07-01 ENCOUNTER — Ambulatory Visit (INDEPENDENT_AMBULATORY_CARE_PROVIDER_SITE_OTHER): Payer: Medicare Other | Admitting: Emergency Medicine

## 2013-07-01 ENCOUNTER — Encounter: Payer: Self-pay | Admitting: Emergency Medicine

## 2013-07-01 VITALS — BP 118/82 | HR 101 | Ht 66.0 in | Wt 150.0 lb

## 2013-07-01 DIAGNOSIS — Z87891 Personal history of nicotine dependence: Secondary | ICD-10-CM | POA: Insufficient documentation

## 2013-07-01 DIAGNOSIS — R0989 Other specified symptoms and signs involving the circulatory and respiratory systems: Principal | ICD-10-CM | POA: Insufficient documentation

## 2013-07-01 DIAGNOSIS — J449 Chronic obstructive pulmonary disease, unspecified: Secondary | ICD-10-CM | POA: Diagnosis not present

## 2013-07-01 DIAGNOSIS — R059 Cough, unspecified: Secondary | ICD-10-CM | POA: Diagnosis not present

## 2013-07-01 DIAGNOSIS — R0609 Other forms of dyspnea: Secondary | ICD-10-CM | POA: Diagnosis present

## 2013-07-01 DIAGNOSIS — R062 Wheezing: Secondary | ICD-10-CM | POA: Insufficient documentation

## 2013-07-01 DIAGNOSIS — R05 Cough: Secondary | ICD-10-CM | POA: Insufficient documentation

## 2013-07-01 DIAGNOSIS — J4489 Other specified chronic obstructive pulmonary disease: Secondary | ICD-10-CM

## 2013-07-01 LAB — PULMONARY FUNCTION TEST
DL/VA % PRED: 73 %
DL/VA: 3.7 ml/min/mmHg/L
DLCO unc % pred: 61 %
DLCO unc: 16.58 ml/min/mmHg
FEF 25-75 Post: 2.44 L/sec
FEF 25-75 Pre: 1.81 L/sec
FEF2575-%CHANGE-POST: 34 %
FEF2575-%Pred-Post: 116 %
FEF2575-%Pred-Pre: 86 %
FEV1-%Change-Post: 7 %
FEV1-%Pred-Post: 109 %
FEV1-%Pred-Pre: 102 %
FEV1-Post: 2.41 L
FEV1-Pre: 2.23 L
FEV1FVC-%Change-Post: 3 %
FEV1FVC-%Pred-Pre: 97 %
FEV6-%Change-Post: 4 %
FEV6-%PRED-PRE: 107 %
FEV6-%Pred-Post: 111 %
FEV6-PRE: 2.9 L
FEV6-Post: 3.02 L
FEV6FVC-%Change-Post: 0 %
FEV6FVC-%Pred-Post: 104 %
FEV6FVC-%Pred-Pre: 103 %
FVC-%Change-Post: 3 %
FVC-%PRED-POST: 107 %
FVC-%Pred-Pre: 103 %
FVC-PRE: 2.91 L
FVC-Post: 3.02 L
POST FEV1/FVC RATIO: 80 %
POST FEV6/FVC RATIO: 100 %
PRE FEV6/FVC RATIO: 100 %
Pre FEV1/FVC ratio: 77 %
RV % PRED: 90 %
RV: 1.94 L
TLC % PRED: 88 %
TLC: 4.71 L

## 2013-07-01 MED ORDER — ALBUTEROL SULFATE (2.5 MG/3ML) 0.083% IN NEBU
2.5000 mg | INHALATION_SOLUTION | Freq: Once | RESPIRATORY_TRACT | Status: AC
  Administered 2013-07-01: 2.5 mg via RESPIRATORY_TRACT

## 2013-07-01 NOTE — Patient Instructions (Signed)
Continue your Advair twice a day  Use albuterol 2 puffs as needed for shortness of breath Follow with Dr Lamonte Sakai in 12 months or sooner if you have any problems

## 2013-07-01 NOTE — Progress Notes (Signed)
Subjective:    Patient ID: Christine Hurley, female    DOB: 1950/03/10, 63 y.o.   MRN: [id number]  HPI 63 yo former smoker (10 pk-yrs), HTN, Hep C, Chest pain s/p low-risk cards eval in 2/13.  Also w hx R LE trauma and fx. Referred by Sammuel Bailiff for evaluation of dyspnea. She quit smoking about 3 months ago. Up until 3 months ago her breathing had been good. She began to notice exertional SOB about that time. She has had chest tightness and pain on 3 of these occasions but not all. She had spirometry done in her PCP office in the past. She was given Advair 3 weeks ago, has been using qam. She has heard some wheezing when sitting still. She has some cough, sore throat, non-productive.   ROV 07/01/13 -- follows up for dyspnea and hx tobacco use. She has been re-evaluated by cardiology > did not need a repeat stress test. She continues to use Advair 250/50 bid, has needed the SABA.  PFT today  > mild AFL only based on curve of the F-V loop, normal volumes, decrease DLCO (no desat on walk)   Review of Systems  Constitutional: Positive for unexpected weight change. Negative for fever.  HENT: Positive for dental problem. Negative for congestion, ear pain, nosebleeds, postnasal drip, rhinorrhea, sinus pressure, sneezing, sore throat and trouble swallowing.   Eyes: Negative for redness and itching.  Respiratory: Positive for shortness of breath. Negative for cough, chest tightness and wheezing.   Cardiovascular: Positive for chest pain, palpitations and leg swelling.       Foot swelling  Gastrointestinal: Negative for nausea and vomiting.  Genitourinary: Negative for dysuria.  Musculoskeletal: Negative for joint swelling.  Skin: Negative for rash.  Neurological: Negative for headaches.  Hematological: Does not bruise/bleed easily.  Psychiatric/Behavioral: Positive for dysphoric mood. The patient is nervous/anxious.    Past Medical History  Diagnosis Date  . Hypertension   . Hepatitis C   .  Abnormal nuclear stress test February '13    Small, partially reversible inferolateral ischemia. Marked hypertensive response to exercise. Low risk.  . Right tibial fracture    . Chest pain with minimal risk for cardiac etiology February 2013    Evaluated with echocardiogram and Myoview, do     Family History  Problem Relation Age of Onset  . Asthma Mother   . Cancer Mother     Brain  . Cancer - Prostate Father      History   Social History  . Marital Status: Divorced    Spouse Name: N/A    Number of Children: 1  . Years of Education: N/A   Occupational History  . Not on file.   Social History Main Topics  . Smoking status: Former Smoker -- 0.25 packs/day for 35 years    Types: Cigarettes    Quit date: 09/10/2012  . Smokeless tobacco: Never Used  . Alcohol Use: No  . Drug Use: No  . Sexual Activity: Not on file   Other Topics Concern  . Not on file   Social History Narrative  . No narrative on file     No Known Allergies   Outpatient Prescriptions Prior to Visit  Medication Sig Dispense Refill  . amLODipine (NORVASC) 10 MG tablet 1 tablet daily.      [name] lisinopril-hydrochlorothiazide (PRINZIDE,ZESTORETIC) 20-25 MG per tablet Take 1 tablet by mouth daily.      [name] PROAIR HFA 108 (90 BASE) MCG/ACT inhaler 1 puff every  6 (six) hours as needed.      . hydrOXYzine (ATARAX/VISTARIL) 25 MG tablet 1 tablet 2 (two) times daily as needed.      [name] escitalopram (LEXAPRO) 5 MG tablet 1 tablet daily.      . penicillin v potassium (VEETID) 500 MG tablet 1 tablet daily.       No facility-administered medications prior to visit.       Objective:   Physical Exam Filed Vitals:   07/01/13 1101  BP: 118/82  Pulse: 101  Height: 5\' 6"  (1.676 m)  Weight: 150 lb (68.04 kg)  SpO2: 99%   Gen: Pleasant, well-nourished, in no distress,  normal affect  ENT: No lesions,  mouth clear,  oropharynx clear, no postnasal drip  Neck: No JVD, no TMG, no carotid bruits  Lungs: No use of  accessory muscles, no dullness to percussion, clear without rales or rhonchi  Cardiovascular: RRR, heart sounds normal, no murmur or gallops, no peripheral edema  Musculoskeletal: No deformities, no cyanosis or clubbing  Neuro: alert, non focal  Skin: Warm, no lesions or rashes     Assessment & Plan:  COPD (chronic obstructive pulmonary disease) Mild disease based on PFT. Considered d/c her scheduled BD's but she has clinically benefited from the Advair.  - contineu the advair - SABA prn - work on conditioning now that cleared by cardiology and pulm - rov 12

## 2013-07-01 NOTE — Assessment & Plan Note (Signed)
Mild disease based on PFT. Considered d/c her scheduled BD's but she has clinically benefited from the Advair.  - contineu the advair - SABA prn - work on conditioning now that cleared by cardiology and pulm - rov 12

## 2014-02-09 ENCOUNTER — Emergency Department (HOSPITAL_COMMUNITY)
Admission: EM | Admit: 2014-02-09 | Discharge: 2014-02-09 | Disposition: A | Discharge status: Home / Self Care | Payer: Medicare Other | Attending: Emergency Medicine | Admitting: Emergency Medicine

## 2014-02-09 ENCOUNTER — Encounter (HOSPITAL_COMMUNITY): Payer: Self-pay | Admitting: Emergency Medicine

## 2014-02-09 DIAGNOSIS — I10 Essential (primary) hypertension: Secondary | ICD-10-CM | POA: Diagnosis present

## 2014-02-09 DIAGNOSIS — R000 Tachycardia, unspecified: Secondary | ICD-10-CM | POA: Diagnosis not present

## 2014-02-09 DIAGNOSIS — Z87828 Personal history of other (healed) physical injury and trauma: Secondary | ICD-10-CM | POA: Insufficient documentation

## 2014-02-09 DIAGNOSIS — K529 Noninfective gastroenteritis and colitis, unspecified: Secondary | ICD-10-CM

## 2014-02-09 DIAGNOSIS — Z87891 Personal history of nicotine dependence: Secondary | ICD-10-CM | POA: Insufficient documentation

## 2014-02-09 DIAGNOSIS — Z8619 Personal history of other infectious and parasitic diseases: Secondary | ICD-10-CM | POA: Diagnosis not present

## 2014-02-09 LAB — COMPREHENSIVE METABOLIC PANEL
ALK PHOS: 61 U/L (ref 39–117)
ALT: 54 U/L — ABNORMAL HIGH (ref 0–35)
ANION GAP: 15 (ref 5–15)
AST: 57 U/L — ABNORMAL HIGH (ref 0–37)
Albumin: 4.7 g/dL (ref 3.5–5.2)
BILIRUBIN TOTAL: 1.2 mg/dL (ref 0.3–1.2)
BUN: 9 mg/dL (ref 6–23)
CO2: 20 mmol/L (ref 19–32)
Calcium: 10.1 mg/dL (ref 8.4–10.5)
Chloride: 100 mEq/L (ref 96–112)
Creatinine, Ser: 0.96 mg/dL (ref 0.50–1.10)
GFR, EST AFRICAN AMERICAN: 71 mL/min — AB (ref >90)
GFR, EST NON AFRICAN AMERICAN: 62 mL/min — AB (ref >90)
Glucose, Bld: 154 mg/dL — ABNORMAL HIGH (ref 70–99)
Potassium: 3.5 mmol/L (ref 3.5–5.1)
Sodium: 135 mmol/L (ref 135–145)
Total Protein: 9.3 g/dL — ABNORMAL HIGH (ref 6.0–8.3)

## 2014-02-09 LAB — CBC WITH DIFFERENTIAL/PLATELET
BASOS PCT: 0 % (ref 0–1)
Basophils Absolute: 0 10*3/uL (ref 0.0–0.1)
EOS PCT: 0 % (ref 0–5)
Eosinophils Absolute: 0 10*3/uL (ref 0.0–0.7)
HCT: 43.8 % (ref 36.0–46.0)
Hemoglobin: 15.6 g/dL — ABNORMAL HIGH (ref 12.0–15.0)
Lymphocytes Relative: 15 % (ref 12–46)
Lymphs Abs: 1.3 10*3/uL (ref 0.7–4.0)
MCH: 31.5 pg (ref 26.0–34.0)
MCHC: 35.6 g/dL (ref 30.0–36.0)
MCV: 88.3 fL (ref 78.0–100.0)
Monocytes Absolute: 0.2 10*3/uL (ref 0.1–1.0)
Monocytes Relative: 3 % (ref 3–12)
Neutro Abs: 7 10*3/uL (ref 1.7–7.7)
Neutrophils Relative %: 82 % — ABNORMAL HIGH (ref 43–77)
PLATELETS: 260 10*3/uL (ref 150–400)
RBC: 4.96 MIL/uL (ref 3.87–5.11)
RDW: 12.4 % (ref 11.5–15.5)
WBC: 8.5 10*3/uL (ref 4.0–10.5)

## 2014-02-09 LAB — I-STAT CHEM 8, ED
BUN: 15 mg/dL (ref 6–23)
CALCIUM ION: 1.08 mmol/L — AB (ref 1.13–1.30)
Chloride: 102 mEq/L (ref 96–112)
Creatinine, Ser: 0.8 mg/dL (ref 0.50–1.10)
Glucose, Bld: 151 mg/dL — ABNORMAL HIGH (ref 70–99)
HEMATOCRIT: 55 % — AB (ref 36.0–46.0)
HEMOGLOBIN: 18.7 g/dL — AB (ref 12.0–15.0)
Potassium: 3.7 mmol/L (ref 3.5–5.1)
Sodium: 139 mmol/L (ref 135–145)
TCO2: 19 mmol/L (ref 0–100)

## 2014-02-09 MED ORDER — ONDANSETRON 4 MG PO TBDP: 4.0000 mg | ORAL_TABLET | Freq: Three times a day (TID) | ORAL | Status: DC | PRN

## 2014-02-09 MED ORDER — SODIUM CHLORIDE 0.9 % IV BOLUS (SEPSIS)
500.0000 mL | Freq: Once | INTRAVENOUS | Status: AC
  Administered 2014-02-09: 500 mL via INTRAVENOUS

## 2014-02-09 MED ORDER — ONDANSETRON HCL 4 MG/2ML IJ SOLN
4.0000 mg | Freq: Once | INTRAMUSCULAR | Status: AC
  Administered 2014-02-09: 4 mg via INTRAVENOUS
  Filled 2014-02-09: qty 2

## 2014-02-09 MED ORDER — ONDANSETRON HCL 4 MG/2ML IJ SOLN
4.0000 mg | Freq: Once | INTRAMUSCULAR | Status: DC
  Filled 2014-02-09: qty 2

## 2014-02-09 MED ORDER — SODIUM CHLORIDE 0.9 % IV SOLN: INTRAVENOUS | Status: DC

## 2014-02-09 MED ORDER — LISINOPRIL-HYDROCHLOROTHIAZIDE 20-25 MG PO TABS: 1.0000 | ORAL_TABLET | Freq: Every day | ORAL | Status: DC

## 2014-02-09 NOTE — ED Provider Notes (Signed)
CSN: [id number]     Arrival date & time 02/09/14  0147 History  This chart was scribed for Fredia Sorrow, MD by Rayfield Citizen, ED Scribe. This patient was seen in room A05C/A05C and the patient's care was started at 2:57 AM.    Chief Complaint  Patient presents with  . Hypertension   Patient is a 63 y.o. female presenting with hypertension and vomiting. The history is provided by the patient. No language interpreter was used.  Hypertension This is a chronic problem. The current episode started more than 2 days ago. The problem occurs constantly. Associated symptoms include abdominal pain and headaches. Pertinent negatives include no chest pain and no shortness of breath. Nothing aggravates the symptoms. Nothing relieves the symptoms. She has tried nothing for the symptoms.  Emesis Severity:  Moderate Duration:  1 day Timing:  Intermittent Number of daily episodes:  12-15    Quality:  Unable to specify Progression:  Unable to specify Chronicity:  New Recent urination:  Normal Relieved by:  None tried Worsened by:  Nothing tried Ineffective treatments:  None tried Associated symptoms: abdominal pain, chills, diarrhea and headaches   Associated symptoms: no sore throat   Abdominal pain:    Location:  Generalized   Quality:  Unable to specify   Severity:  Moderate   Onset quality:  Gradual   Duration:  1 day   Timing:  Intermittent   Progression:  Unchanged Diarrhea:    Quality:  Unable to specify   Number of occurrences:  2   Severity:  Mild   Duration:  1 day   Timing:  Intermittent   Progression:  Unchanged    HPI Comments: Christine Hurley is a 63 y.o. female who presents to the Emergency Department for hypertension concerns. Patient currently takes lisinopril-HCTZ 20-25mg , 1x per day first thing in the AM - she ran out of this medication three days PTA but she was unable to go to the pharmacy to pick it up. She reports abdominal pain, nausea, vomiting (12-15x, no blood),  and diarrhea (2x, no blood) beginning today. She reports "hot and cold" flashes with mild headache that has improved at present.    Past Medical History  Diagnosis Date  . Hypertension   . Hepatitis C   . Abnormal nuclear stress test February '13    Small, partially reversible inferolateral ischemia. Marked hypertensive response to exercise. Low risk.  . Right tibial fracture    . Chest pain with minimal risk for cardiac etiology February 2013    Evaluated with echocardiogram and Myoview, do   Past Surgical History  Procedure Laterality Date  . Tibia fracture surgery Right 09/2008    fracture of right lower leg with open reduction and internal fixation  . Doppler echocardiography  03/26/2011    LVEF>55% normal LV wall thickness, normal LA, mild aortic sclerosis with trace to mild AI, mild to moderate TR, RVSP of 76mmHg, RA pressure about 5 mmHg  . Nm myoview ltd  03/27/11    post stress ejection fraction is 72%, abnormal myocardial perfusion study, this is a low risk scan   Family History  Problem Relation Age of Onset  . Asthma Mother   . Cancer Mother     Brain  . Cancer - Prostate Father    History  Substance Use Topics  . Smoking status: Former Smoker -- 0.25 packs/day for 35 years    Types: Cigarettes    Quit date: 09/10/2012  . Smokeless tobacco: Never Used  .  Alcohol Use: No   OB History    No data available     Review of Systems  Constitutional: Positive for chills. Negative for fever.  HENT: Negative for rhinorrhea and sore throat.   Eyes: Negative for visual disturbance.  Respiratory: Negative for cough and shortness of breath.   Cardiovascular: Negative for chest pain and leg swelling.  Gastrointestinal: Positive for nausea, vomiting, abdominal pain and diarrhea.  Genitourinary: Negative for dysuria, frequency and hematuria.  Musculoskeletal: Negative for back pain.  Skin: Negative for rash.  Neurological: Positive for headaches.  Hematological: Does not  bruise/bleed easily.  Psychiatric/Behavioral: Negative for confusion.      Allergies  Review of patient's allergies indicates no known allergies.  Home Medications   Prior to Admission medications   Medication Sig Start Date End Date Taking? Authorizing Provider  lisinopril-hydrochlorothiazide (PRINZIDE,ZESTORETIC) 20-25 MG per tablet Take 1 tablet by mouth daily. 02/09/14   Fredia Sorrow, MD  ondansetron (ZOFRAN ODT) 4 MG disintegrating tablet Take 1 tablet (4 mg total) by mouth every 8 (eight) hours as needed for nausea or vomiting. 02/09/14   Fredia Sorrow, MD   BP 185/98 mmHg  Pulse 102  Temp(Src) 98.6 F (37 C) (Oral)  Resp 22  SpO2 98% Physical Exam  Constitutional: She is oriented to person, place, and time. She appears well-developed and well-nourished.  HENT:  Head: Normocephalic and atraumatic.  Mouth/Throat: Oropharynx is clear and moist. No oropharyngeal exudate.  Mucous membranes slightly dry  Eyes: Conjunctivae and EOM are normal. Pupils are equal, round, and reactive to light. No scleral icterus.  Cardiovascular: Normal rate, regular rhythm and normal heart sounds.  Exam reveals no gallop and no friction rub.   No murmur heard. Slightly tachycardic  Pulmonary/Chest: Effort normal and breath sounds normal. No respiratory distress. She has no wheezes. She has no rales.  Abdominal: Soft. Bowel sounds are normal. There is no tenderness. There is no rebound and no guarding.  Musculoskeletal: Normal range of motion. She exhibits no edema.  Neurological: She is alert and oriented to person, place, and time. No cranial nerve deficit.  Skin: Skin is warm and dry. No rash noted.  Psychiatric: She has a normal mood and affect. Her behavior is normal.  Nursing note and vitals reviewed.   ED Course  Procedures   DIAGNOSTIC STUDIES: Oxygen Saturation is 99% on RA, normal by my interpretation.    COORDINATION OF CARE: 3:05 AM Discussed treatment plan with pt at  bedside and pt agreed to plan.   Labs Review Labs Reviewed  CBC WITH DIFFERENTIAL - Abnormal; Notable for the following:    Hemoglobin 15.6 (*)    Neutrophils Relative % 82 (*)    All other components within normal limits  COMPREHENSIVE METABOLIC PANEL - Abnormal; Notable for the following:    Glucose, Bld 154 (*)    Total Protein 9.3 (*)    AST 57 (*)    ALT 54 (*)    GFR calc non Af Amer 62 (*)    GFR calc Af Amer 71 (*)    All other components within normal limits  I-STAT CHEM 8, ED - Abnormal; Notable for the following:    Glucose, Bld 151 (*)    Calcium, Ion 1.08 (*)    Hemoglobin 18.7 (*)    HCT 55.0 (*)    All other components within normal limits    Imaging Review No results found.   EKG Interpretation None      MDM  Final diagnoses:  Essential hypertension  Gastroenteritis   's symptoms seem to be consistent with a gastroenteritis type picture. Which is improving now. Patient hydrated here. Patient ran out of her blood pressure medicine 3 days ago. This would explain the high blood pressure. Only takes it in the morning so will just renew her prescription. Patient's electrolytes without significant abnormalities.  I personally performed the services described in this documentation, which was scribed in my presence. The recorded information has been reviewed and is accurate.       Fredia Sorrow, MD 02/09/14 [phone]

## 2014-02-09 NOTE — ED Notes (Signed)
Pt called out asking if she could get dressed and go home. This RN informed the pt that the physician would like to see the pt before she was discharged.

## 2014-02-09 NOTE — Discharge Instructions (Signed)
Take Zofran as needed for any further nausea and vomiting. Your blood pressure medicines been renewed. Follow-up with your regular doctor in the next few days. Have blood pressure recheck to make sure it's under control. Return for any new or worse symptoms.

## 2014-02-09 NOTE — ED Notes (Signed)
Pt states that she is ready to go home. 

## 2014-02-09 NOTE — ED Notes (Signed)
Pt. reports elevated blood pressure at home 205/118 this evening with nausea and vomitting , pt. has not taken her antihypertensive medications for 3 days .

## 2014-07-25 LAB — HM COLONOSCOPY

## 2015-01-08 ENCOUNTER — Emergency Department (HOSPITAL_COMMUNITY)
Admission: EM | Admit: 2015-01-08 | Discharge: 2015-01-08 | Disposition: A | Discharge status: Home / Self Care | Payer: Medicare Other | Attending: Emergency Medicine | Admitting: Emergency Medicine

## 2015-01-08 ENCOUNTER — Encounter (HOSPITAL_COMMUNITY): Payer: Self-pay | Admitting: Family Medicine

## 2015-01-08 DIAGNOSIS — I10 Essential (primary) hypertension: Secondary | ICD-10-CM | POA: Insufficient documentation

## 2015-01-08 DIAGNOSIS — Z79899 Other long term (current) drug therapy: Secondary | ICD-10-CM | POA: Insufficient documentation

## 2015-01-08 DIAGNOSIS — Z720 Tobacco use: Secondary | ICD-10-CM

## 2015-01-08 DIAGNOSIS — F17210 Nicotine dependence, cigarettes, uncomplicated: Secondary | ICD-10-CM | POA: Insufficient documentation

## 2015-01-08 DIAGNOSIS — Z8781 Personal history of (healed) traumatic fracture: Secondary | ICD-10-CM | POA: Diagnosis not present

## 2015-01-08 DIAGNOSIS — Z8619 Personal history of other infectious and parasitic diseases: Secondary | ICD-10-CM | POA: Insufficient documentation

## 2015-01-08 DIAGNOSIS — R42 Dizziness and giddiness: Secondary | ICD-10-CM

## 2015-01-08 NOTE — ED Notes (Signed)
Pt here due to her B/p being high , b/p has come down now pt has no complaints

## 2015-01-08 NOTE — Discharge Instructions (Signed)
Continue taking your blood pressure medications as directed. Decrease the salt intake in your diet. Stop smoking! Stay well hydrated. Monitor your blood pressures twice daily, once in the morning after awakening and once before bed, always allow for a 10-15 minute relaxation time before taking your blood pressure. Document these numbers in a journal to bring to your regular doctor at your next visit. Follow up with your regular doctor in 1 week for ongoing management of your blood pressure. Return to the ER for changes or worsening symptoms.   Hypertension Hypertension is another name for high blood pressure. High blood pressure forces your heart to work harder to pump blood. A blood pressure reading has two numbers, which includes a higher number over a lower number (example: 110/72). HOME CARE   Have your blood pressure rechecked by your doctor.  Only take medicine as told by your doctor. Follow the directions carefully. The medicine does not work as well if you skip doses. Skipping doses also puts you at risk for problems.  Do not smoke.  Monitor your blood pressure at home as told by your doctor. GET HELP IF:  You think you are having a reaction to the medicine you are taking.  You have repeat headaches or feel dizzy.  You have puffiness (swelling) in your ankles.  You have trouble with your vision. GET HELP RIGHT AWAY IF:   You get a very bad headache and are confused.  You feel weak, numb, or faint.  You get chest or belly (abdominal) pain.  You throw up (vomit).  You cannot breathe very well. MAKE SURE YOU:   Understand these instructions.  Will watch your condition.  Will get help right away if you are not doing well or get worse.   This information is not intended to replace advice given to you by your health care provider. Make sure you discuss any questions you have with your health care provider.   Document Released: 07/16/2007 Document Revised: 02/01/2013  Document Reviewed: 11/19/2012 Elsevier Interactive Patient Education 2016 [place] DASH stands for "Dietary Approaches to Stop Hypertension." The DASH eating plan is a healthy eating plan that has been shown to reduce high blood pressure (hypertension). Additional health benefits may include reducing the risk of type 2 diabetes mellitus, heart disease, and stroke. The DASH eating plan may also help with weight loss. WHAT DO I NEED TO KNOW ABOUT THE DASH EATING PLAN? For the DASH eating plan, you will follow these general guidelines:  Choose foods with a percent daily value for sodium of less than 5% (as listed on the food label).  Use salt-free seasonings or herbs instead of table salt or sea salt.  Check with your health care provider or pharmacist before using salt substitutes.  Eat lower-sodium products, often labeled as "lower sodium" or "no salt added."  Eat fresh foods.  Eat more vegetables, fruits, and low-fat dairy products.  Choose whole grains. Look for the word "whole" as the first word in the ingredient list.  Choose fish and skinless chicken or [place] more often than red meat. Limit fish, poultry, and meat to 6 oz (170 g) each day.  Limit sweets, desserts, sugars, and sugary drinks.  Choose heart-healthy fats.  Limit cheese to 1 oz (28 g) per day.  Eat more home-cooked food and less restaurant, buffet, and fast food.  Limit fried foods.  Cook foods using methods other than frying.  Limit canned vegetables. If you do use  them, rinse them well to decrease the sodium.  When eating at a restaurant, ask that your food be prepared with less salt, or no salt if possible. WHAT FOODS CAN I EAT? Seek help from a dietitian for individual calorie needs. Grains Whole grain or whole wheat bread. Brown rice. Whole grain or whole wheat pasta. Quinoa, bulgur, and whole grain cereals. Low-sodium cereals. Corn or whole wheat flour tortillas. Whole grain  cornbread. Whole grain crackers. Low-sodium crackers. Vegetables Fresh or frozen vegetables (raw, steamed, roasted, or grilled). Low-sodium or reduced-sodium tomato and vegetable juices. Low-sodium or reduced-sodium tomato sauce and paste. Low-sodium or reduced-sodium canned vegetables.  Fruits All fresh, canned (in natural juice), or frozen fruits. Meat and Other Protein Products Ground beef (85% or leaner), grass-fed beef, or beef trimmed of fat. Skinless chicken or [place]. Ground chicken or [place]. Pork trimmed of fat. All fish and seafood. Eggs. Dried beans, peas, or lentils. Unsalted nuts and seeds. Unsalted canned beans. Dairy Low-fat dairy products, such as skim or 1% milk, 2% or reduced-fat cheeses, low-fat ricotta or cottage cheese, or plain low-fat yogurt. Low-sodium or reduced-sodium cheeses. Fats and Oils Tub margarines without trans fats. Light or reduced-fat mayonnaise and salad dressings (reduced sodium). Avocado. Safflower, olive, or canola oils. Natural peanut or almond butter. Other Unsalted popcorn and pretzels. The items listed above may not be a complete list of recommended foods or beverages. Contact your dietitian for more options. WHAT FOODS ARE NOT RECOMMENDED? Grains White bread. White pasta. White rice. Refined cornbread. Bagels and croissants. Crackers that contain trans fat. Vegetables Creamed or fried vegetables. Vegetables in a cheese sauce. Regular canned vegetables. Regular canned tomato sauce and paste. Regular tomato and vegetable juices. Fruits Dried fruits. Canned fruit in light or heavy syrup. Fruit juice. Meat and Other Protein Products Fatty cuts of meat. Ribs, chicken wings, bacon, sausage, bologna, salami, chitterlings, fatback, hot dogs, bratwurst, and packaged luncheon meats. Salted nuts and seeds. Canned beans with salt. Dairy Whole or 2% milk, cream, half-and-half, and cream cheese. Whole-fat or sweetened yogurt. Full-fat cheeses or blue cheese.  Nondairy creamers and whipped toppings. Processed cheese, cheese spreads, or cheese curds. Condiments Onion and garlic salt, seasoned salt, table salt, and sea salt. Canned and packaged gravies. Worcestershire sauce. Tartar sauce. Barbecue sauce. Teriyaki sauce. Soy sauce, including reduced sodium. Steak sauce. Fish sauce. Oyster sauce. Cocktail sauce. Horseradish. Ketchup and mustard. Meat flavorings and tenderizers. Bouillon cubes. Hot sauce. Tabasco sauce. Marinades. Taco seasonings. Relishes. Fats and Oils Butter, stick margarine, lard, shortening, ghee, and bacon fat. Coconut, palm kernel, or palm oils. Regular salad dressings. Other Pickles and olives. Salted popcorn and pretzels. The items listed above may not be a complete list of foods and beverages to avoid. Contact your dietitian for more information. WHERE CAN I FIND MORE INFORMATION? National Heart, Lung, and Blood Institute: travelstabloid.com   This information is not intended to replace advice given to you by your health care provider. Make sure you discuss any questions you have with your health care provider.   Document Released: 01/16/2011 Document Revised: 02/17/2014 Document Reviewed: 12/01/2012 Elsevier Interactive Patient Education 2016 [employer].  How to Take Your Blood Pressure HOW DO I GET A BLOOD PRESSURE MACHINE?  You can buy an electronic home blood pressure machine at your local pharmacy. Insurance will sometimes cover the cost if you have a prescription.  Ask your doctor what type of machine is best for you. There are different machines for your arm and your wrist.  If you decide to buy a machine to check your blood pressure on your arm, first check the size of your arm so you can buy the right size cuff. To check the size of your arm:   Use a measuring tape that shows both inches and centimeters.   Wrap the measuring tape around the upper-middle part of your arm. You  may need someone to help you measure.   Write down your arm measurement in both inches and centimeters.   To measure your blood pressure correctly, it is important to have the right size cuff.   If your arm is up to 13 inches (up to 34 centimeters), get an adult cuff size.  If your arm is 13 to 17 inches (35 to 44 centimeters), get a large adult cuff size.    If your arm is 17 to 20 inches (45 to 52 centimeters), get an adult thigh cuff.  WHAT DO THE NUMBERS MEAN?   There are two numbers that make up your blood pressure. For example: 120/80.  The first number (120 in our example) is called the "systolic pressure." It is a measure of the pressure in your blood vessels when your heart is pumping blood.  The second number (80 in our example) is called the "diastolic pressure." It is a measure of the pressure in your blood vessels when your heart is resting between beats.  Your doctor will tell you what your blood pressure should be. WHAT SHOULD I DO BEFORE I CHECK MY BLOOD PRESSURE?   Try to rest or relax for at least 30 minutes before you check your blood pressure.  Do not smoke.  Do not have any drinks with caffeine, such as:  Soda.  Coffee.  Tea.  Check your blood pressure in a quiet room.  Sit down and stretch out your arm on a table. Keep your arm at about the level of your heart. Let your arm relax.  Make sure that your legs are not crossed. HOW DO I CHECK MY BLOOD PRESSURE?  Follow the directions that came with your machine.  Make sure you remove any tight-fitting clothing from your arm or wrist. Wrap the cuff around your upper arm or wrist. You should be able to fit a finger between the cuff and your arm. If you cannot fit a finger between the cuff and your arm, it is too tight and should be removed and rewrapped.  Some units require you to manually pump up the arm cuff.  Automatic units inflate the cuff when you press a button.  Cuff deflation is automatic  in both models.  After the cuff is inflated, the unit measures your blood pressure and pulse. The readings are shown on a monitor. Hold still and breathe normally while the cuff is inflated.  Getting a reading takes less than a minute.  Some models store readings in a memory. Some provide a printout of readings. If your machine does not store your readings, keep a written record.  Take readings with you to your next visit with your doctor.   This information is not intended to replace advice given to you by your health care provider. Make sure you discuss any questions you have with your health care provider.   Document Released: 01/10/2008 Document Revised: 02/17/2014 Document Reviewed: 03/24/2013 Elsevier Interactive Patient Education 2016 [place] Your High Blood Pressure Blood pressure is a measurement of how forceful your blood is pressing against the walls of the arteries.  Arteries are muscular tubes within the circulatory system. Blood pressure does not stay the same. Blood pressure rises when you are active, excited, or nervous; and it lowers during sleep and relaxation. If the numbers measuring your blood pressure stay above normal most of the time, you are at risk for health problems. High blood pressure (hypertension) is a long-term (chronic) condition in which blood pressure is elevated. A blood pressure reading is recorded as two numbers, such as 120 over 80 (or 120/80). The first, higher number is called the systolic pressure. It is a measure of the pressure in your arteries as the heart beats. The second, lower number is called the diastolic pressure. It is a measure of the pressure in your arteries as the heart relaxes between beats.  Keeping your blood pressure in a normal range is important to your overall health and prevention of health problems, such as heart disease and stroke. When your blood pressure is uncontrolled, your heart has to work harder than normal.  High blood pressure is a very common condition in adults because blood pressure tends to rise with age. Men and women are equally likely to have hypertension but at different times in life. Before age 94, men are more likely to have hypertension. After 64 years of age, women are more likely to have it. Hypertension is especially common in African Americans. This condition often has no signs or symptoms. The cause of the condition is usually not known. Your caregiver can help you come up with a plan to keep your blood pressure in a normal, healthy range. BLOOD PRESSURE STAGES Blood pressure is classified into four stages: normal, prehypertension, stage 1, and stage 2. Your blood pressure reading will be used to determine what type of treatment, if any, is necessary. Appropriate treatment options are tied to these four stages:  Normal  Systolic pressure (mm Hg): below 120.  Diastolic pressure (mm Hg): below 80. Prehypertension  Systolic pressure (mm Hg): 120 to 139.  Diastolic pressure (mm Hg): 80 to 89. Stage1  Systolic pressure (mm Hg): 140 to 159.  Diastolic pressure (mm Hg): 90 to 99. Stage2  Systolic pressure (mm Hg): 160 or above.  Diastolic pressure (mm Hg): 100 or above. RISKS RELATED TO HIGH BLOOD PRESSURE Managing your blood pressure is an important responsibility. Uncontrolled high blood pressure can lead to:  A heart attack.  A stroke.  A weakened blood vessel (aneurysm).  Heart failure.  Kidney damage.  Eye damage.  Metabolic syndrome.  Memory and concentration problems. HOW TO MANAGE YOUR BLOOD PRESSURE Blood pressure can be managed effectively with lifestyle changes and medicines (if needed). Your caregiver will help you come up with a plan to bring your blood pressure within a normal range. Your plan should include the following: Education  Read all information provided by your caregivers about how to control blood pressure.  Educate yourself on the  latest guidelines and treatment recommendations. New research is always being done to further define the risks and treatments for high blood pressure. Lifestylechanges  Control your weight.  Avoid smoking.  Stay physically active.  Reduce the amount of salt in your diet.  Reduce stress.  Control any chronic conditions, such as high cholesterol or diabetes.  Reduce your alcohol intake. Medicines  Several medicines (antihypertensive medicines) are available, if needed, to bring blood pressure within a normal range. Communication  Review all the medicines you take with your caregiver because there may be side effects or interactions.  Talk with  your caregiver about your diet, exercise habits, and other lifestyle factors that may be contributing to high blood pressure.  See your caregiver regularly. Your caregiver can help you create and adjust your plan for managing high blood pressure. RECOMMENDATIONS FOR TREATMENT AND FOLLOW-UP  The following recommendations are based on current guidelines for managing high blood pressure in nonpregnant adults. Use these recommendations to identify the proper follow-up period or treatment option based on your blood pressure reading. You can discuss these options with your caregiver.  Systolic pressure of [id number] to [id number] or diastolic pressure of 80 to 89: Follow up with your caregiver as directed.  Systolic pressure of [id number] to 0000000 or diastolic pressure of 90 to 100: Follow up with your caregiver within 2 months.  Systolic pressure above 0000000 or diastolic pressure above [id number]: Follow up with your caregiver within 1 month.  Systolic pressure above [id number] or diastolic pressure above [id number]: Consider antihypertensive therapy; follow up with your caregiver within 1 week.  Systolic pressure above [id number] or diastolic pressure above 123456: Begin antihypertensive therapy; follow up with your caregiver within 1 week.   This information is not intended to replace advice given  to you by your health care provider. Make sure you discuss any questions you have with your health care provider.   Document Released: 10/22/2011 Document Reviewed: 10/22/2011 Elsevier Interactive Patient Education 2016 [employer].  Smoking Cessation, Tips for Success If you are ready to quit smoking, congratulations! You have chosen to help yourself be healthier. Cigarettes bring nicotine, tar, carbon monoxide, and other irritants into your body. Your lungs, heart, and blood vessels will be able to work better without these poisons. There are many different ways to quit smoking. Nicotine gum, nicotine patches, a nicotine inhaler, or nicotine nasal spray can help with physical craving. Hypnosis, support groups, and medicines help break the habit of smoking. WHAT THINGS CAN I DO TO MAKE QUITTING EASIER?  Here are some tips to help you quit for good:  Pick a date when you will quit smoking completely. Tell all of your friends and family about your plan to quit on that date.  Do not try to slowly cut down on the number of cigarettes you are smoking. Pick a quit date and quit smoking completely starting on that day.  Throw away all cigarettes.   Clean and remove all ashtrays from your home, work, and car.  On a card, write down your reasons for quitting. Carry the card with you and read it when you get the urge to smoke.  Cleanse your body of nicotine. Drink enough water and fluids to keep your urine clear or pale yellow. Do this after quitting to flush the nicotine from your body.  Learn to predict your moods. Do not let a bad situation be your excuse to have a cigarette. Some situations in your life might tempt you into wanting a cigarette.  Never have "just one" cigarette. It leads to wanting another and another. Remind yourself of your decision to quit.  Change habits associated with smoking. If you smoked while driving or when feeling stressed, try other activities to replace smoking.  Stand up when drinking your coffee. Brush your teeth after eating. Sit in a different chair when you read the paper. Avoid alcohol while trying to quit, and try to drink fewer caffeinated beverages. Alcohol and caffeine may urge you to smoke.  Avoid foods and drinks that can trigger a desire to smoke, such as sugary or spicy foods  and alcohol.  Ask people who smoke not to smoke around you.  Have something planned to do right after eating or having a cup of coffee. For example, plan to take a walk or exercise.  Try a relaxation exercise to calm you down and decrease your stress. Remember, you may be tense and nervous for the first 2 weeks after you quit, but this will pass.  Find new activities to keep your hands busy. Play with a pen, coin, or rubber band. Doodle or draw things on paper.  Brush your teeth right after eating. This will help cut down on the craving for the taste of tobacco after meals. You can also try mouthwash.   Use oral substitutes in place of cigarettes. Try using lemon drops, carrots, cinnamon sticks, or chewing gum. Keep them handy so they are available when you have the urge to smoke.  When you have the urge to smoke, try deep breathing.  Designate your home as a nonsmoking area.  If you are a heavy smoker, ask your health care provider about a prescription for nicotine chewing gum. It can ease your withdrawal from nicotine.  Reward yourself. Set aside the cigarette money you save and buy yourself something nice.  Look for support from others. Join a support group or smoking cessation program. Ask someone at home or at work to help you with your plan to quit smoking.  Always ask yourself, "Do I need this cigarette or is this just a reflex?" Tell yourself, "Today, I choose not to smoke," or "I do not want to smoke." You are reminding yourself of your decision to quit.  Do not replace cigarette smoking with electronic cigarettes (commonly called e-cigarettes). The  safety of e-cigarettes is unknown, and some may contain harmful chemicals.  If you relapse, do not give up! Plan ahead and think about what you will do the next time you get the urge to smoke. HOW WILL I FEEL WHEN I QUIT SMOKING? You may have symptoms of withdrawal because your body is used to nicotine (the addictive substance in cigarettes). You may crave cigarettes, be irritable, feel very hungry, cough often, get headaches, or have difficulty concentrating. The withdrawal symptoms are only temporary. They are strongest when you first quit but will go away within 10-14 days. When withdrawal symptoms occur, stay in control. Think about your reasons for quitting. Remind yourself that these are signs that your body is healing and getting used to being without cigarettes. Remember that withdrawal symptoms are easier to treat than the major diseases that smoking can cause.  Even after the withdrawal is over, expect periodic urges to smoke. However, these cravings are generally short lived and will go away whether you smoke or not. Do not smoke! WHAT RESOURCES ARE AVAILABLE TO HELP ME QUIT SMOKING? Your health care provider can direct you to community resources or hospitals for support, which may include:  Group support.  Education.  Hypnosis.  Therapy.   This information is not intended to replace advice given to you by your health care provider. Make sure you discuss any questions you have with your health care provider.   Document Released: 10/26/2003 Document Revised: 02/17/2014 Document Reviewed: 07/15/2012 Elsevier Interactive Patient Education [employer].

## 2015-01-08 NOTE — ED Provider Notes (Signed)
CSN: [id number]     Arrival date & time 01/08/15  1041 History   First MD Initiated Contact with Patient 01/08/15 1145     Chief Complaint  Patient presents with  . Hypertension     (Consider location/radiation/quality/duration/timing/severity/associated sxs/prior Treatment) HPI Comments: Christine Hurley is a 64 y.o. female with a PMHx of HTN, hepC, and prior chest pain with small partially reversible inferolateral ischemia with marked hypertensive response to exercise on stress test, who presents to the ED with complaints of hypertension. Patient states that 2 days ago she missed one day of her lisinopril-HCTZ 20-25 mg medication, and this morning when she got up from laying in bed she had some lightheadedness with position change, took her blood pressure and it was 210/119. She took her blood pressure medication at 10 AM, and her blood pressure went down to 148/98, but subsequently rose again to [id number] systolic therefore she called her regular doctor who advised her to seek emergent medical attention. She states she currently has no lightheadedness, but this only happened this morning with position change. She denies any vision changes, headache, fevers, chills, chest pain, shortness of breath, leg swelling, abdominal pain, nausea, vomiting, diarrhea, constipation, dysuria, hematuria, numbness, tingling, weakness, tinnitus, or hearing loss. She admits to increased salt intake recently due to the holidays. She admits to smoking 1/3 pack per day.  Patient is a 64 y.o. female presenting with hypertension. The history is provided by the patient. No language interpreter was used.  Hypertension This is a chronic problem. The current episode started today. The problem occurs rarely. The problem has been rapidly improving. Pertinent negatives include no abdominal pain, arthralgias, chest pain, chills, fever, headaches, myalgias, nausea, numbness, urinary symptoms, vertigo, visual change, vomiting or weakness.  Exacerbated by: salt intake. Treatments tried: BP medication. The treatment provided moderate relief.    Past Medical History  Diagnosis Date  . Hypertension   . Hepatitis C   . Abnormal nuclear stress test February '13    Small, partially reversible inferolateral ischemia. Marked hypertensive response to exercise. Low risk.  . Right tibial fracture    . Chest pain with minimal risk for cardiac etiology February 2013    Evaluated with echocardiogram and Myoview, do   Past Surgical History  Procedure Laterality Date  . Tibia fracture surgery Right 09/2008    fracture of right lower leg with open reduction and internal fixation  . Doppler echocardiography  03/26/2011    LVEF>55% normal LV wall thickness, normal LA, mild aortic sclerosis with trace to mild AI, mild to moderate TR, RVSP of 54mmHg, RA pressure about 5 mmHg  . Nm myoview ltd  03/27/11    post stress ejection fraction is 72%, abnormal myocardial perfusion study, this is a low risk scan   Family History  Problem Relation Age of Onset  . Asthma Mother   . Cancer Mother     Brain  . Cancer - Prostate Father    Social History  Substance Use Topics  . Smoking status: Former Smoker -- 0.25 packs/day for 35 years    Types: Cigarettes    Quit date: 09/10/2012  . Smokeless tobacco: Never Used  . Alcohol Use: No   OB History    No data available     Review of Systems  Constitutional: Negative for fever and chills.       +HTN  HENT: Negative for hearing loss and tinnitus.   Eyes: Negative for visual disturbance.  Respiratory: Negative for shortness of  breath.   Cardiovascular: Negative for chest pain and leg swelling.  Gastrointestinal: Negative for nausea, vomiting, abdominal pain, diarrhea and constipation.  Genitourinary: Negative for dysuria and hematuria.  Musculoskeletal: Negative for myalgias and arthralgias.  Skin: Negative for color change.  Allergic/Immunologic: Negative for immunocompromised state.   Neurological: Positive for light-headedness (with position changes, resolved). Negative for vertigo, weakness, numbness and headaches.  Psychiatric/Behavioral: Negative for confusion.   10 Systems reviewed and are negative for acute change except as noted in the HPI.    Allergies  Review of patient's allergies indicates no known allergies.  Home Medications   Prior to Admission medications   Medication Sig Start Date End Date Taking? Authorizing Provider  lisinopril-hydrochlorothiazide (PRINZIDE,ZESTORETIC) 20-25 MG per tablet Take 1 tablet by mouth daily. 02/09/14   Fredia Sorrow, MD  ondansetron (ZOFRAN ODT) 4 MG disintegrating tablet Take 1 tablet (4 mg total) by mouth every 8 (eight) hours as needed for nausea or vomiting. 02/09/14   Fredia Sorrow, MD   Triage VS: BP 202/115 mmHg  Pulse 87  Temp(Src) 98 F (36.7 C) (Oral)  Resp 20  SpO2 97% Exam VS: BP 147/110 mmHg  Pulse 62  Temp(Src) 98 F (36.7 C) (Oral)  Resp 20  SpO2 100%  Physical Exam  Constitutional: She is oriented to person, place, and time. Vital signs are normal. She appears well-developed and well-nourished.  Non-toxic appearance. No distress.  Afebrile, nontoxic, NAD. HTN noted, initially 202/115, on exam BP 147/110.  HENT:  Head: Normocephalic and atraumatic.  Mouth/Throat: Oropharynx is clear and moist and mucous membranes are normal.  Eyes: Conjunctivae and EOM are normal. Pupils are equal, round, and reactive to light. Right eye exhibits no discharge. Left eye exhibits no discharge.  PERRL, EOMI, no nystagmus, no visual field deficits   Neck: Normal range of motion. Neck supple.  Cardiovascular: Normal rate, regular rhythm, normal heart sounds and intact distal pulses.  Exam reveals no gallop and no friction rub.   No murmur heard. RRR, nl s1/s2, no m/r/g, distal pulses intact, no pedal edema   Pulmonary/Chest: Effort normal and breath sounds normal. No respiratory distress. She has no decreased  breath sounds. She has no wheezes. She has no rhonchi. She has no rales.  CTAB in all lung fields, no w/r/r, no hypoxia or increased WOB, speaking in full sentences, SpO2 97% on RA   Abdominal: Soft. Normal appearance and bowel sounds are normal. She exhibits no distension. There is no tenderness. There is no rigidity, no rebound, no guarding, no CVA tenderness, no tenderness at McBurney's point and negative Murphy's sign.  Musculoskeletal: Normal range of motion.  MAE x4 Strength and sensation grossly intact Distal pulses intact No pedal edema, neg homan's bilaterally  Gait steady  Neurological: She is alert and oriented to person, place, and time. She has normal strength. No cranial nerve deficit or sensory deficit. Coordination and gait normal. GCS eye subscore is 4. GCS verbal subscore is 5. GCS motor subscore is 6.  CN 2-12 grossly intact A&O x4 GCS 15 Sensation and strength intact Gait nonataxic Coordination with finger-to-nose WNL Neg pronator drift   Skin: Skin is warm, dry and intact. No rash noted.  Psychiatric: She has a normal mood and affect.  Nursing note and vitals reviewed.   ED Course  Procedures (including critical care time) Labs Review Labs Reviewed - No data to display  Imaging Review No results found. I have personally reviewed and evaluated these images and lab results as part  of my medical decision-making.   EKG Interpretation None      MDM   Final diagnoses:  Essential hypertension  Episodic lightheadedness  Tobacco use    64 y.o. female here with HTN. States she missed one dose of medication 2 days ago, but admits she had a lot of salty foods recently. BP this AM was 210/119 before she took her lisinopril-hctz, then went down to 148/98, but went back up so she called her PCP who advised her to come here. States initially she had some lightheadedness with position changes but no longer having that. Asymptomatic otherwise. No focal neuro exam  findings. BP now 147/110. Doubt need for labs/EKG/imaging, appears that her BP meds likely did what they were supposed to, and she would benefit from salt reduction in diet. Doubt need for changing medication. Smoking cessation encouraged. Otherwise, f/up with PCP in 1wk. I explained the diagnosis and have given explicit precautions to return to the ER including for any other new or worsening symptoms. The patient understands and accepts the medical plan as it's been dictated and I have answered their questions. Discharge instructions concerning home care and prescriptions have been given. The patient is STABLE and is discharged to home in good condition.   BP 147/110 mmHg  Pulse 62  Temp(Src) 98 F (36.7 C) (Oral)  Resp 20  SpO2 100%   Keylan Costabile Camprubi-Soms, PA-C 01/08/15 1214  Tanna Furry, MD 01/17/15 [phone]

## 2015-01-08 NOTE — ED Notes (Signed)
Pt here for hypertension. sts 207 sys this am and after she took her BP meds it decreased to 140 sys. Denies pain.

## 2015-03-07 DIAGNOSIS — R9439 Abnormal result of other cardiovascular function study: Secondary | ICD-10-CM | POA: Insufficient documentation

## 2015-09-16 IMAGING — CR DG CHEST 2V
2 series · 2 of 2 positions shown · non-contrast
Comparison: None.

CLINICAL DATA: Shortness of breath.  Emesis.

EXAM:
CHEST  2 VIEW

[w chest pa]
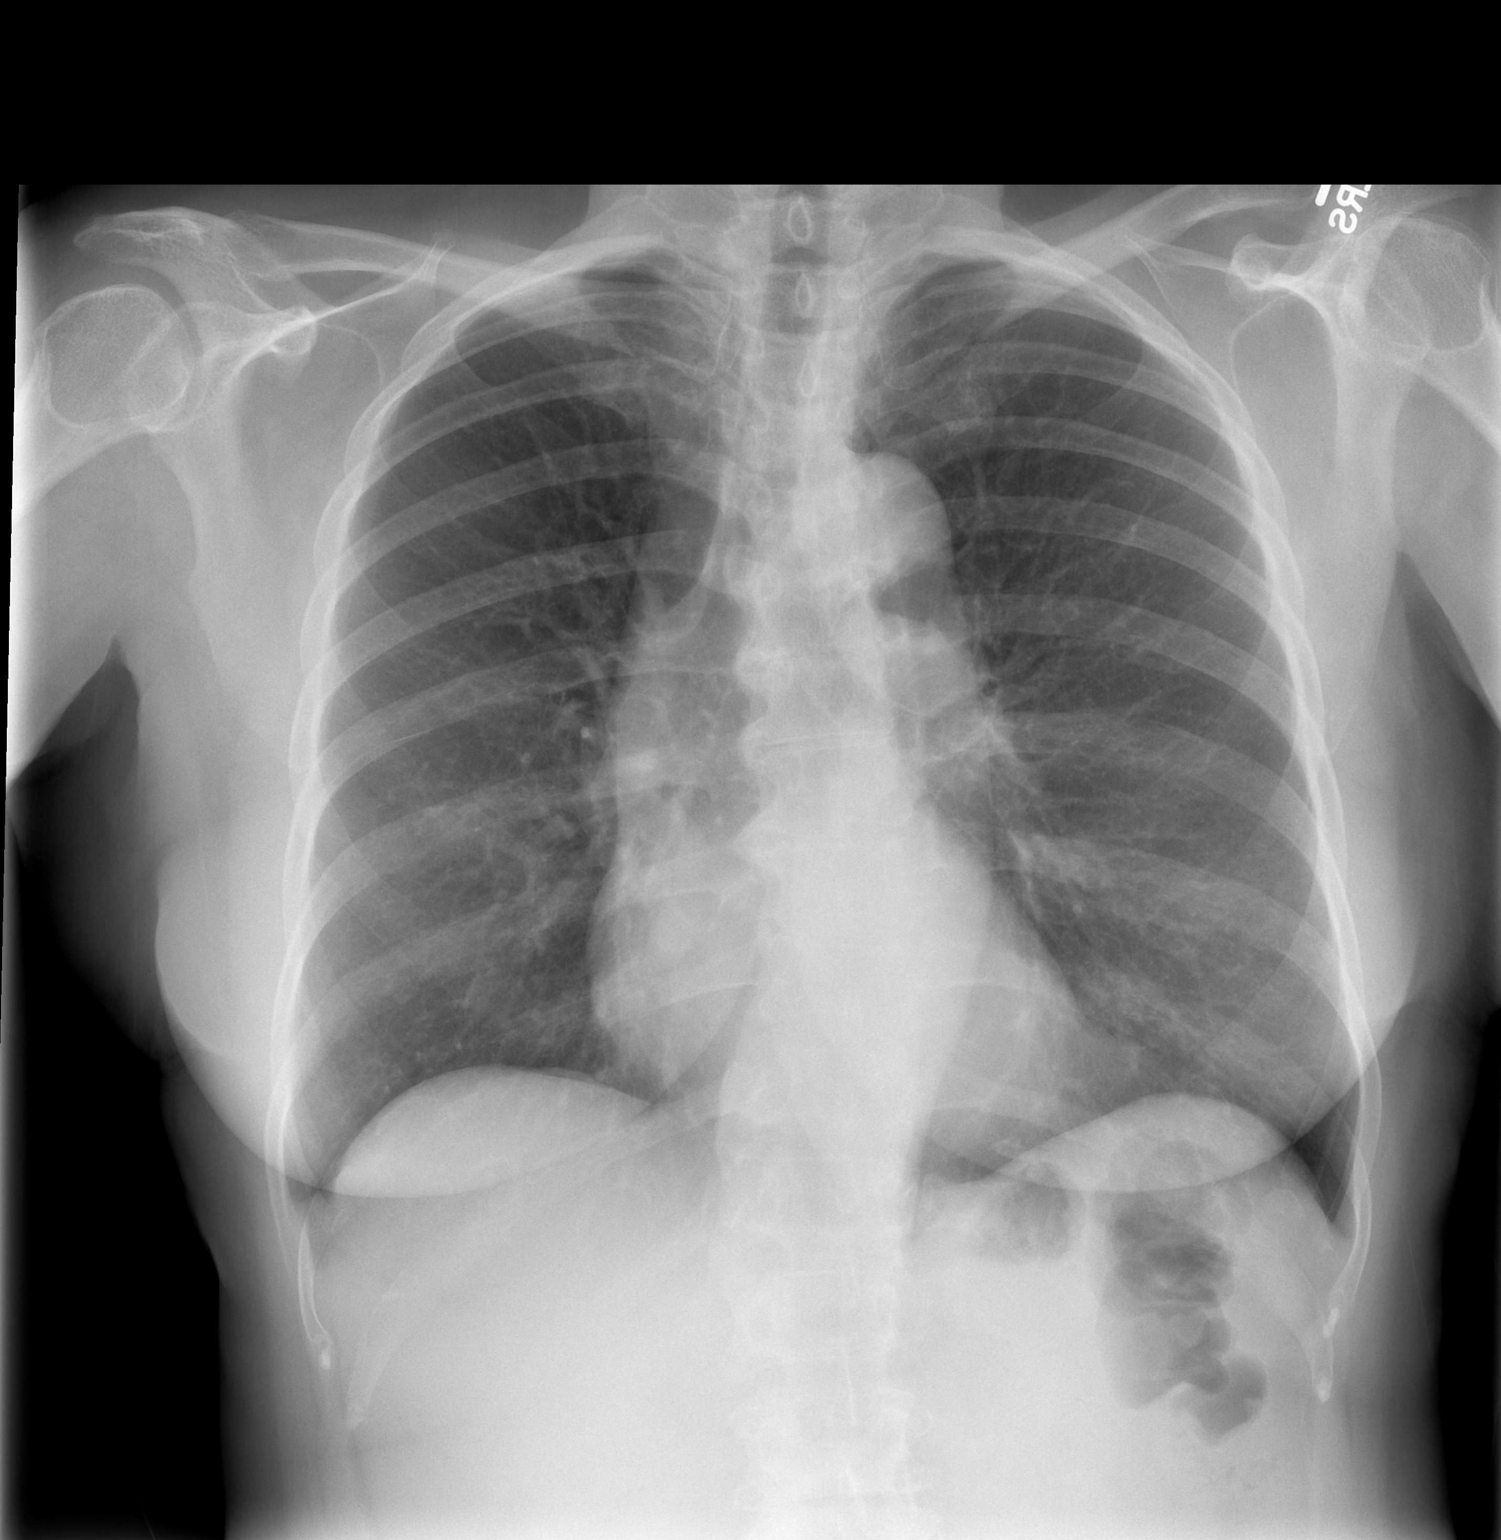

[w chest lat]
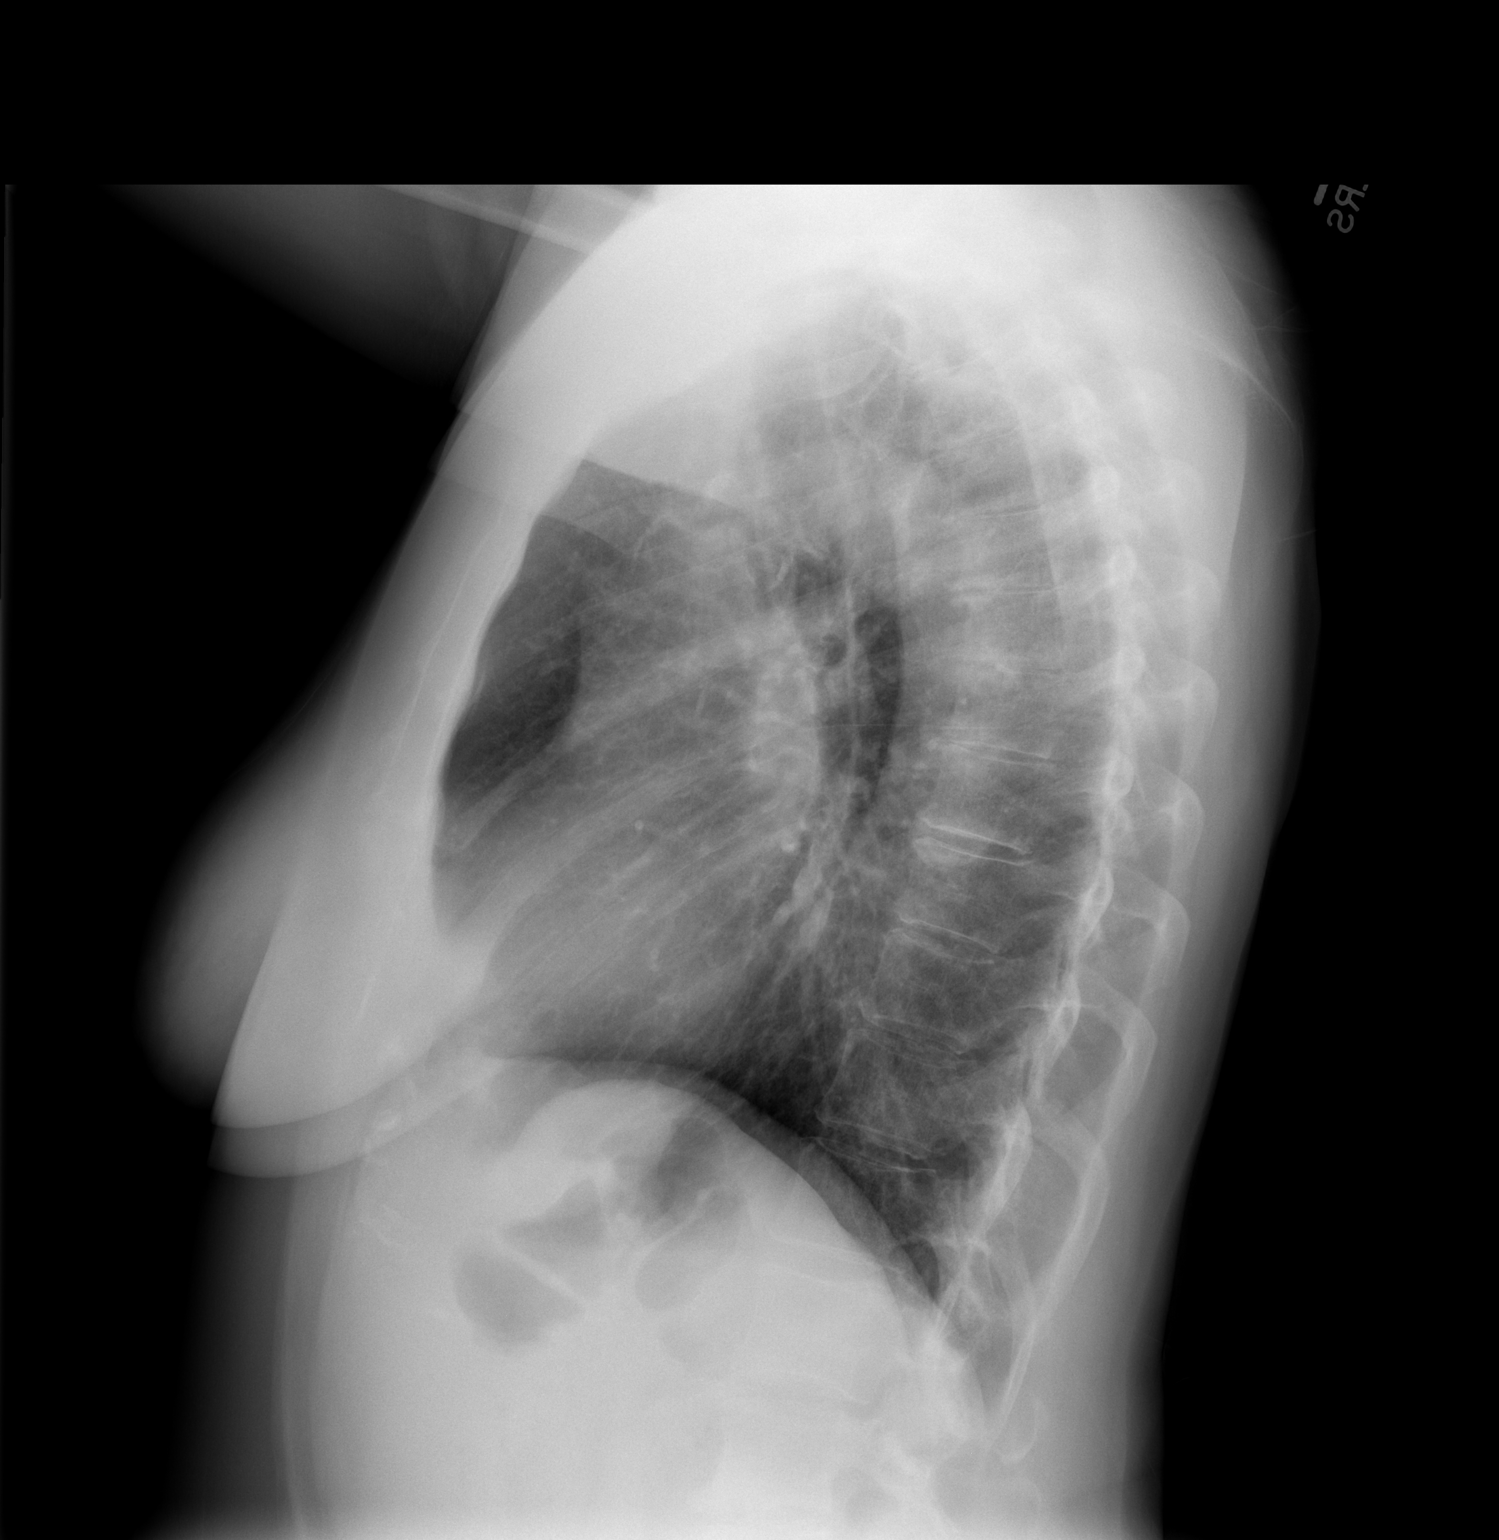

[2 of 2 positions shown; findings below may reference images not displayed]

FINDINGS: Normal heart size the aortic tortuosity. No acute infiltrate or
edema. No effusion or pneumothorax. Concavity in the ventral cortex
of the upper sternal body is presumably chronic given the history.
No acute osseous findings.
IMPRESSION: No active cardiopulmonary disease.

## 2015-11-22 ENCOUNTER — Emergency Department (HOSPITAL_COMMUNITY): Payer: Medicare Other

## 2015-11-22 ENCOUNTER — Inpatient Hospital Stay (HOSPITAL_COMMUNITY)
Admission: EM | Admit: 2015-11-22 | Discharge: 2015-11-25 | DRG: 305 | Disposition: A | Discharge status: Home / Self Care | Payer: Medicare Other | Attending: Internal Medicine | Admitting: Internal Medicine

## 2015-11-22 ENCOUNTER — Encounter (HOSPITAL_COMMUNITY): Payer: Self-pay | Admitting: Internal Medicine

## 2015-11-22 DIAGNOSIS — Y92009 Unspecified place in unspecified non-institutional (private) residence as the place of occurrence of the external cause: Secondary | ICD-10-CM | POA: Diagnosis not present

## 2015-11-22 DIAGNOSIS — Z825 Family history of asthma and other chronic lower respiratory diseases: Secondary | ICD-10-CM

## 2015-11-22 DIAGNOSIS — R945 Abnormal results of liver function studies: Secondary | ICD-10-CM

## 2015-11-22 DIAGNOSIS — F1010 Alcohol abuse, uncomplicated: Secondary | ICD-10-CM | POA: Diagnosis present

## 2015-11-22 DIAGNOSIS — J449 Chronic obstructive pulmonary disease, unspecified: Secondary | ICD-10-CM | POA: Diagnosis present

## 2015-11-22 DIAGNOSIS — Z23 Encounter for immunization: Secondary | ICD-10-CM

## 2015-11-22 DIAGNOSIS — Z91128 Patient's intentional underdosing of medication regimen for other reason: Secondary | ICD-10-CM | POA: Diagnosis not present

## 2015-11-22 DIAGNOSIS — I160 Hypertensive urgency: Secondary | ICD-10-CM | POA: Diagnosis present

## 2015-11-22 DIAGNOSIS — Z87891 Personal history of nicotine dependence: Secondary | ICD-10-CM | POA: Diagnosis not present

## 2015-11-22 DIAGNOSIS — T464X6A Underdosing of angiotensin-converting-enzyme inhibitors, initial encounter: Secondary | ICD-10-CM | POA: Diagnosis present

## 2015-11-22 DIAGNOSIS — R778 Other specified abnormalities of plasma proteins: Secondary | ICD-10-CM | POA: Diagnosis present

## 2015-11-22 DIAGNOSIS — R1010 Upper abdominal pain, unspecified: Secondary | ICD-10-CM

## 2015-11-22 DIAGNOSIS — J410 Simple chronic bronchitis: Secondary | ICD-10-CM | POA: Diagnosis not present

## 2015-11-22 DIAGNOSIS — K8020 Calculus of gallbladder without cholecystitis without obstruction: Secondary | ICD-10-CM | POA: Diagnosis present

## 2015-11-22 DIAGNOSIS — B182 Chronic viral hepatitis C: Secondary | ICD-10-CM | POA: Diagnosis present

## 2015-11-22 DIAGNOSIS — R7989 Other specified abnormal findings of blood chemistry: Secondary | ICD-10-CM | POA: Diagnosis present

## 2015-11-22 DIAGNOSIS — R109 Unspecified abdominal pain: Secondary | ICD-10-CM

## 2015-11-22 DIAGNOSIS — R079 Chest pain, unspecified: Secondary | ICD-10-CM | POA: Diagnosis not present

## 2015-11-22 DIAGNOSIS — R1013 Epigastric pain: Secondary | ICD-10-CM

## 2015-11-22 DIAGNOSIS — B1710 Acute hepatitis C without hepatic coma: Secondary | ICD-10-CM | POA: Diagnosis not present

## 2015-11-22 DIAGNOSIS — I10 Essential (primary) hypertension: Secondary | ICD-10-CM | POA: Diagnosis present

## 2015-11-22 DIAGNOSIS — R509 Fever, unspecified: Secondary | ICD-10-CM

## 2015-11-22 DIAGNOSIS — Z808 Family history of malignant neoplasm of other organs or systems: Secondary | ICD-10-CM | POA: Diagnosis not present

## 2015-11-22 DIAGNOSIS — R1011 Right upper quadrant pain: Secondary | ICD-10-CM

## 2015-11-22 DIAGNOSIS — R112 Nausea with vomiting, unspecified: Secondary | ICD-10-CM | POA: Diagnosis present

## 2015-11-22 DIAGNOSIS — M6281 Muscle weakness (generalized): Secondary | ICD-10-CM

## 2015-11-22 LAB — CBC
HEMATOCRIT: 44 % (ref 36.0–46.0)
HEMOGLOBIN: 15.3 g/dL — AB (ref 12.0–15.0)
MCH: 31.7 pg (ref 26.0–34.0)
MCHC: 34.8 g/dL (ref 30.0–36.0)
MCV: 91.3 fL (ref 78.0–100.0)
Platelets: 245 10*3/uL (ref 150–400)
RBC: 4.82 MIL/uL (ref 3.87–5.11)
RDW: 12.8 % (ref 11.5–15.5)
WBC: 5.1 10*3/uL (ref 4.0–10.5)

## 2015-11-22 LAB — LIPASE, BLOOD: LIPASE: 56 U/L — AB (ref 11–51)

## 2015-11-22 LAB — COMPREHENSIVE METABOLIC PANEL
ALT: 169 U/L — ABNORMAL HIGH (ref 14–54)
ANION GAP: 12 (ref 5–15)
AST: 146 U/L — ABNORMAL HIGH (ref 15–41)
Albumin: 4.4 g/dL (ref 3.5–5.0)
Alkaline Phosphatase: 68 U/L (ref 38–126)
BUN: 12 mg/dL (ref 6–20)
CO2: 23 mmol/L (ref 22–32)
Calcium: 10.1 mg/dL (ref 8.9–10.3)
Chloride: 103 mmol/L (ref 101–111)
Creatinine, Ser: 0.81 mg/dL (ref 0.44–1.00)
GFR calc Af Amer: >60 mL/min (ref >60)
GFR calc non Af Amer: >60 mL/min (ref >60)
Glucose, Bld: 140 mg/dL — ABNORMAL HIGH (ref 65–99)
POTASSIUM: 3.7 mmol/L (ref 3.5–5.1)
SODIUM: 138 mmol/L (ref 135–145)
TOTAL PROTEIN: 9.4 g/dL — AB (ref 6.5–8.1)
Total Bilirubin: 0.8 mg/dL (ref 0.3–1.2)

## 2015-11-22 LAB — ACETAMINOPHEN LEVEL

## 2015-11-22 LAB — URINALYSIS, ROUTINE W REFLEX MICROSCOPIC
BILIRUBIN URINE: NEGATIVE
Glucose, UA: NEGATIVE mg/dL
Hgb urine dipstick: NEGATIVE
Ketones, ur: 15 mg/dL — AB
Leukocytes, UA: NEGATIVE
NITRITE: NEGATIVE
PH: 6 (ref 5.0–8.0)
Protein, ur: NEGATIVE mg/dL
SPECIFIC GRAVITY, URINE: 1.021 (ref 1.005–1.030)

## 2015-11-22 LAB — I-STAT TROPONIN, ED: Troponin i, poc: 0.01 ng/mL (ref 0.00–0.08)

## 2015-11-22 LAB — CBG MONITORING, ED: Glucose-Capillary: 190 mg/dL — ABNORMAL HIGH (ref 65–99)

## 2015-11-22 LAB — TROPONIN I: Troponin I: <0.03 ng/mL (ref <0.03)

## 2015-11-22 MED ORDER — ONDANSETRON HCL 4 MG PO TABS: 4.0000 mg | ORAL_TABLET | Freq: Four times a day (QID) | ORAL | Status: DC | PRN

## 2015-11-22 MED ORDER — METOCLOPRAMIDE HCL 5 MG/ML IJ SOLN
10.0000 mg | Freq: Once | INTRAMUSCULAR | Status: AC
  Administered 2015-11-22: 10 mg via INTRAVENOUS
  Filled 2015-11-22: qty 2

## 2015-11-22 MED ORDER — ONDANSETRON 4 MG PO TBDP
4.0000 mg | ORAL_TABLET | Freq: Once | ORAL | Status: AC
  Administered 2015-11-22: 4 mg via ORAL

## 2015-11-22 MED ORDER — ONDANSETRON 4 MG PO TBDP
ORAL_TABLET | ORAL | Status: AC
  Filled 2015-11-22: qty 1

## 2015-11-22 MED ORDER — SODIUM CHLORIDE 0.9 % IV SOLN
INTRAVENOUS | Status: AC
  Administered 2015-11-22 – 2015-11-23 (×2): via INTRAVENOUS

## 2015-11-22 MED ORDER — LORAZEPAM 2 MG/ML IJ SOLN: 1.0000 mg | Freq: Four times a day (QID) | INTRAMUSCULAR | Status: DC | PRN

## 2015-11-22 MED ORDER — SODIUM CHLORIDE 0.9 % IV BOLUS (SEPSIS)
1000.0000 mL | Freq: Once | INTRAVENOUS | Status: AC
  Administered 2015-11-22: 1000 mL via INTRAVENOUS

## 2015-11-22 MED ORDER — VITAMIN B-1 100 MG PO TABS
100.0000 mg | ORAL_TABLET | Freq: Every day | ORAL | Status: DC
  Administered 2015-11-23 – 2015-11-25 (×3): 100 mg via ORAL
  Filled 2015-11-22 (×3): qty 1

## 2015-11-22 MED ORDER — ONDANSETRON HCL 4 MG/2ML IJ SOLN: 4.0000 mg | Freq: Four times a day (QID) | INTRAMUSCULAR | Status: DC | PRN

## 2015-11-22 MED ORDER — MORPHINE SULFATE (PF) 4 MG/ML IV SOLN
4.0000 mg | Freq: Once | INTRAVENOUS | Status: AC
  Administered 2015-11-22: 4 mg via INTRAVENOUS
  Filled 2015-11-22: qty 1

## 2015-11-22 MED ORDER — NITROGLYCERIN IN D5W 200-5 MCG/ML-% IV SOLN
0.0000 ug/min | INTRAVENOUS | Status: DC
  Administered 2015-11-22: 5 ug/min via INTRAVENOUS
  Administered 2015-11-23: 145 ug/min via INTRAVENOUS
  Filled 2015-11-22 (×2): qty 250

## 2015-11-22 MED ORDER — ADULT MULTIVITAMIN W/MINERALS CH
1.0000 | ORAL_TABLET | Freq: Every day | ORAL | Status: DC
  Administered 2015-11-23 – 2015-11-25 (×3): 1 via ORAL
  Filled 2015-11-22 (×3): qty 1

## 2015-11-22 MED ORDER — LORAZEPAM 2 MG/ML IJ SOLN: 0.0000 mg | Freq: Four times a day (QID) | INTRAMUSCULAR | Status: AC

## 2015-11-22 MED ORDER — DEXTROSE 5 % IV SOLN
2.0000 g | Freq: Once | INTRAVENOUS | Status: AC
  Administered 2015-11-22: 2 g via INTRAVENOUS
  Filled 2015-11-22: qty 2

## 2015-11-22 MED ORDER — DEXTROSE 5 % IV SOLN
2.0000 g | INTRAVENOUS | Status: DC
  Administered 2015-11-23 – 2015-11-24 (×2): 2 g via INTRAVENOUS
  Filled 2015-11-22 (×4): qty 2

## 2015-11-22 MED ORDER — ONDANSETRON HCL 4 MG/2ML IJ SOLN
4.0000 mg | Freq: Once | INTRAMUSCULAR | Status: AC
  Administered 2015-11-22: 4 mg via INTRAVENOUS
  Filled 2015-11-22: qty 2

## 2015-11-22 MED ORDER — LORAZEPAM 2 MG/ML IJ SOLN
0.0000 mg | Freq: Two times a day (BID) | INTRAMUSCULAR | Status: DC
Start: 2015-11-25 — End: 2015-11-25

## 2015-11-22 MED ORDER — THIAMINE HCL 100 MG/ML IJ SOLN: 100.0000 mg | Freq: Every day | INTRAMUSCULAR | Status: DC

## 2015-11-22 MED ORDER — LABETALOL HCL 5 MG/ML IV SOLN
10.0000 mg | Freq: Once | INTRAVENOUS | Status: AC
  Administered 2015-11-22: 10 mg via INTRAVENOUS
  Filled 2015-11-22: qty 4

## 2015-11-22 MED ORDER — LABETALOL HCL 5 MG/ML IV SOLN
20.0000 mg | Freq: Once | INTRAVENOUS | Status: AC
  Administered 2015-11-22: 20 mg via INTRAVENOUS
  Filled 2015-11-22: qty 4

## 2015-11-22 MED ORDER — HYDRALAZINE HCL 20 MG/ML IJ SOLN
10.0000 mg | INTRAMUSCULAR | Status: DC | PRN
  Administered 2015-11-22 – 2015-11-23 (×2): 10 mg via INTRAVENOUS
  Filled 2015-11-22 (×2): qty 1

## 2015-11-22 MED ORDER — FOLIC ACID 1 MG PO TABS
1.0000 mg | ORAL_TABLET | Freq: Every day | ORAL | Status: DC
  Administered 2015-11-23 – 2015-11-25 (×3): 1 mg via ORAL
  Filled 2015-11-22 (×3): qty 1

## 2015-11-22 MED ORDER — LORAZEPAM 1 MG PO TABS
1.0000 mg | ORAL_TABLET | Freq: Four times a day (QID) | ORAL | Status: DC | PRN
  Administered 2015-11-23 – 2015-11-25 (×2): 1 mg via ORAL
  Filled 2015-11-22 (×2): qty 1

## 2015-11-22 NOTE — ED Provider Notes (Signed)
[facility] DEPT Provider Note   CSN: [id number] Arrival date & time: 11/22/15  1612     History   Chief Complaint Chief Complaint  Patient presents with  . Abdominal Pain    HPI Christine Hurley is a 65 y.o. female.  HPI   Christine Hurley is a 65 y.o. female with PMH significant for HTN, hepatitic C who presents with sudden onset, severe, epigastric abdominal pain that began approximately 12 PM today.  Patient states she took some cold medicine, Theraflu, a couple sips of beer and her BP medications just prior to onset.  Associated symptoms include 3 episodes of NBNB emesis, diaphoresis, and dizziness.  Denies HA, fever, SOB, nausea, diarrhea, constipation, urinary symptoms.  No prior medications.  She states she may have missed some of her BP medication doses. She takes lisinopril-HCTZ.   Past Medical History:  Diagnosis Date  . Abnormal nuclear stress test February '13   Small, partially reversible inferolateral ischemia. Marked hypertensive response to exercise. Low risk.  . Chest pain with minimal risk for cardiac etiology February 2013   Evaluated with echocardiogram and Myoview, do  . Hepatitis C   . Hypertension   . Right tibial fracture      Patient Active Problem List   Diagnosis Date Noted  . COPD (chronic obstructive pulmonary disease) ([place]) 07/01/2013  . Hypertension 06/06/2013  . Dyspnea on exertion 06/01/2013  . Heart palpitations 07/17/2012  . Abnormal nuclear stress test   . Chest pain with minimal risk for cardiac etiology 03/14/2011  . VISUAL ACUITY, DECREASED 02/19/2009  . CERUMEN IMPACTION, BILATERAL 10/30/2008  . POSTMENOPAUSAL SYNDROME 10/30/2008  . OPEN FX MULTIPLE SITES PHALANX/PHALANGES HAND 09/23/2008  . CLOSED FRACTURE OF SHAFT OF FIBULA WITH TIBIA 09/23/2008  . TOBACCO ABUSE 03/24/2008  . DENTAL CARIES 03/24/2008  . HYPERTENSION, BENIGN ESSENTIAL 11/11/2007  . HEPATITIS C 11/04/2006    Past Surgical History:  Procedure Laterality  Date  . DOPPLER ECHOCARDIOGRAPHY  03/26/2011   LVEF>55% normal LV wall thickness, normal LA, mild aortic sclerosis with trace to mild AI, mild to moderate TR, RVSP of 60mmHg, RA pressure about 5 mmHg  . NM MYOVIEW LTD  03/27/11   post stress ejection fraction is 72%, abnormal myocardial perfusion study, this is a low risk scan  . TIBIA FRACTURE SURGERY Right 09/2008   fracture of right lower leg with open reduction and internal fixation    OB History    No data available       Home Medications    Prior to Admission medications   Medication Sig Start Date End Date Taking? Authorizing Provider  lisinopril-hydrochlorothiazide (PRINZIDE,ZESTORETIC) 20-25 MG per tablet Take 1 tablet by mouth daily. 02/09/14  Yes Fredia Sorrow, MD  Nutritional Supplements (COLD AND FLU PO) Take 15 mLs by mouth daily as needed. For cold and flu symptoms   Yes Historical Provider, MD  ondansetron (ZOFRAN ODT) 4 MG disintegrating tablet Take 1 tablet (4 mg total) by mouth every 8 (eight) hours as needed for nausea or vomiting. Patient not taking: Reported on 11/22/2015 02/09/14   Fredia Sorrow, MD    Family History Family History  Problem Relation Age of Onset  . Asthma Mother   . Cancer Mother     Brain  . Cancer - Prostate Father     Social History Social History  Substance Use Topics  . Smoking status: Former Smoker    Packs/day: 0.25    Years: 35.00    Types: Cigarettes  Quit date: 09/10/2012  . Smokeless tobacco: Never Used  . Alcohol use No     Allergies   Review of patient's allergies indicates no known allergies.   Review of Systems Review of Systems All other systems negative unless otherwise stated in HPI   Physical Exam Updated Vital Signs BP (!) 265/138   Pulse 84   Temp 97.7 F (36.5 C) (Oral)   Resp 23   SpO2 96%   Physical Exam  Constitutional: She is oriented to person, place, and time. She appears well-developed and well-nourished.  Non-toxic appearance.  She does not have a sickly appearance. She does not appear ill. She appears distressed.  HENT:  Head: Normocephalic and atraumatic.  Mouth/Throat: Oropharynx is clear and moist.  Eyes: Conjunctivae and EOM are normal. Pupils are equal, round, and reactive to light.  Neck: Normal range of motion. Neck supple.  Cardiovascular: Normal rate, regular rhythm and normal heart sounds.   Pulmonary/Chest: Effort normal and breath sounds normal. No accessory muscle usage or stridor. No respiratory distress. She has no wheezes. She has no rhonchi. She has no rales.  Abdominal: Soft. Bowel sounds are normal. She exhibits no distension. There is no tenderness. There is no rebound and no guarding.  Musculoskeletal: Normal range of motion.  Lymphadenopathy:    She has no cervical adenopathy.  Neurological: She is alert and oriented to person, place, and time.  Speech clear without dysarthria. Cranial nerves grossly intact.  Normal strength and sensation throughout bilateral upper and lower extremities.  No pronator drift.  Normal finger to nose.  Skin: Skin is warm. She is diaphoretic.  Psychiatric: She has a normal mood and affect. Her behavior is normal.     ED Treatments / Results  Labs (all labs ordered are listed, but only abnormal results are displayed) Labs Reviewed  LIPASE, BLOOD - Abnormal; Notable for the following:       Result Value   Lipase 56 (*)    All other components within normal limits  COMPREHENSIVE METABOLIC PANEL - Abnormal; Notable for the following:    Glucose, Bld 140 (*)    Total Protein 9.4 (*)    AST 146 (*)    ALT 169 (*)    All other components within normal limits  CBC - Abnormal; Notable for the following:    Hemoglobin 15.3 (*)    All other components within normal limits  URINALYSIS, ROUTINE W REFLEX MICROSCOPIC (NOT AT [facility]) - Abnormal; Notable for the following:    Color, Urine AMBER (*)    APPearance HAZY (*)    Ketones, ur 15 (*)    All other components  within normal limits  ACETAMINOPHEN LEVEL - Abnormal; Notable for the following:    Acetaminophen (Tylenol), Serum <10 (*)    All other components within normal limits  HEPATITIS PANEL, ACUTE  I-STAT TROPOININ, ED    EKG  EKG Interpretation  Date/Time:  Thursday November 22 2015 [id number] EDT Ventricular Rate:  62 PR Interval:  140 QRS Duration: 86 QT Interval:  450 QTC Calculation: 456 R Axis:   50 Text Interpretation:  Normal sinus rhythm Possible Left atrial enlargement Possible Anterior infarct , age undetermined ST & T wave abnormality, consider inferior ischemia Abnormal ECG No significant change was found Confirmed by CAMPOS  MD, Lennette Bihari ([id number]) on 11/22/2015 5:59:54 PM       Radiology Dg Chest 2 View  Result Date: 11/22/2015 CLINICAL DATA:  Mental status changes.  Fatigue. EXAM: CHEST  2  VIEW COMPARISON:  05/18/2013. FINDINGS: AP and lateral views of the chest were obtained. Cardiomediastinal prominence likely accentuated by AP technique and slight rightward patient rotation. Lungs are clear. The visualized bony structures of the thorax are intact. IMPRESSION: No acute cardiopulmonary findings. Electronically Signed   By: Misty Stanley M.D.   On: 11/22/2015 17:56   US Abdomen Limited Ruq  Result Date: 11/22/2015 CLINICAL DATA:  65 year old female with abdominal pain. Hepatitis-C. Hypertension. Initial encounter. EXAM: US ABDOMEN LIMITED - RIGHT UPPER QUADRANT COMPARISON:  03/08/2012 ultrasound. FINDINGS: Gallbladder: Tiny gallstones without gallbladder wall thickening or pericholecystic fluid. No tenderness over gallbladder during scanning per ultrasound technologist. Common bile duct: Diameter: 3.9 mm Liver: No focal lesion identified. Within normal limits in parenchymal echogenicity. IMPRESSION: Tiny gallstones without ultrasound evidence of cholecystitis. Electronically Signed   By: Genia Del M.D.   On: 11/22/2015 19:03    Procedures Procedures (including critical care  time)  Medications Ordered in ED Medications  ondansetron (ZOFRAN-ODT) 4 MG disintegrating tablet (not administered)  labetalol (NORMODYNE,TRANDATE) injection 10 mg (not administered)  cefTRIAXone (ROCEPHIN) 2 g in dextrose 5 % 50 mL IVPB (not administered)  ondansetron (ZOFRAN-ODT) disintegrating tablet 4 mg (4 mg Oral Given 11/22/15 1625)  sodium chloride 0.9 % bolus 1,000 mL (0 mLs Intravenous Stopped 11/22/15 2036)  ondansetron (ZOFRAN) injection 4 mg (4 mg Intravenous Given 11/22/15 1908)  morphine 4 MG/ML injection 4 mg (4 mg Intravenous Given 11/22/15 1908)     Initial Impression / Assessment and Plan / ED Course  I have reviewed the triage vital signs and the nursing notes.  Pertinent labs & imaging results that were available during my care of the patient were reviewed by me and considered in my medical decision making (see chart for details).  Clinical Course   Patient presents with sudden onset abdominal pain, now resolving.  On exam, heart sounds normal, lungs clear, abdomen soft and benign without rebound, guarding, or rigidity.  She is hypertensive, but no CP or signs of dissection.  Received Labetalol.  EKG without ischemia. Troponin 0.01.  She has elevated LFTs, ultrasound obtained and shows tiny gallstones, normal CBD.  Acute hepatitis panel pending.  She is feeling improved, but given abrupt onset abdominal pain and elevated LFTs patient, possible cholecystitis, will order Rocephin.  Plan admit for IV abx, LFT trending, and BP monitoring.  Case has been discussed with and seen by Dr. Venora Maples who agrees with the above plan for admission.    Final Clinical Impressions(s) / ED Diagnoses   Final diagnoses:  Upper abdominal pain  Hypertension, unspecified type  Non-intractable vomiting with nausea, unspecified vomiting type    New Prescriptions New Prescriptions   No medications on file     Vivien Rossetti 11/22/15 2041    Jola Schmidt, MD 11/22/15 2155

## 2015-11-22 NOTE — ED Notes (Signed)
Pt is at ultrasound

## 2015-11-22 NOTE — ED Notes (Signed)
Patient transported to X-ray 

## 2015-11-22 NOTE — ED Triage Notes (Signed)
Pt c/o abdominal pain starting a hour ago. Pt states she was sitting on the couch, took her blood pressure medicine with a sip of beer then started to have severe abdominal pain. Pt is diaphoretic, actively vomiting in triage.

## 2015-11-23 ENCOUNTER — Inpatient Hospital Stay (HOSPITAL_COMMUNITY): Payer: Medicare Other

## 2015-11-23 DIAGNOSIS — I160 Hypertensive urgency: Principal | ICD-10-CM

## 2015-11-23 DIAGNOSIS — B1710 Acute hepatitis C without hepatic coma: Secondary | ICD-10-CM

## 2015-11-23 DIAGNOSIS — F1010 Alcohol abuse, uncomplicated: Secondary | ICD-10-CM

## 2015-11-23 DIAGNOSIS — J410 Simple chronic bronchitis: Secondary | ICD-10-CM

## 2015-11-23 LAB — BASIC METABOLIC PANEL
Anion gap: 11 (ref 5–15)
BUN: 9 mg/dL (ref 6–20)
CALCIUM: 9.1 mg/dL (ref 8.9–10.3)
CHLORIDE: 102 mmol/L (ref 101–111)
CO2: 21 mmol/L — ABNORMAL LOW (ref 22–32)
CREATININE: 0.74 mg/dL (ref 0.44–1.00)
GFR calc Af Amer: >60 mL/min (ref >60)
GFR calc non Af Amer: >60 mL/min (ref >60)
Glucose, Bld: 164 mg/dL — ABNORMAL HIGH (ref 65–99)
Potassium: 3.6 mmol/L (ref 3.5–5.1)
SODIUM: 134 mmol/L — AB (ref 135–145)

## 2015-11-23 LAB — CBC
HCT: 40.2 % (ref 36.0–46.0)
Hemoglobin: 14 g/dL (ref 12.0–15.0)
MCH: 31.5 pg (ref 26.0–34.0)
MCHC: 34.8 g/dL (ref 30.0–36.0)
MCV: 90.3 fL (ref 78.0–100.0)
PLATELETS: 234 10*3/uL (ref 150–400)
RBC: 4.45 MIL/uL (ref 3.87–5.11)
RDW: 12.7 % (ref 11.5–15.5)
WBC: 7.7 10*3/uL (ref 4.0–10.5)

## 2015-11-23 LAB — HEPATIC FUNCTION PANEL
ALT: 130 U/L — ABNORMAL HIGH (ref 14–54)
AST: 95 U/L — ABNORMAL HIGH (ref 15–41)
Albumin: 3.8 g/dL (ref 3.5–5.0)
Alkaline Phosphatase: 56 U/L (ref 38–126)
BILIRUBIN DIRECT: 0.2 mg/dL (ref 0.1–0.5)
BILIRUBIN INDIRECT: 0.6 mg/dL (ref 0.3–0.9)
TOTAL PROTEIN: 7.8 g/dL (ref 6.5–8.1)
Total Bilirubin: 0.8 mg/dL (ref 0.3–1.2)

## 2015-11-23 LAB — RAPID URINE DRUG SCREEN, HOSP PERFORMED
AMPHETAMINES: NOT DETECTED
BENZODIAZEPINES: NOT DETECTED
Barbiturates: NOT DETECTED
Cocaine: NOT DETECTED
Opiates: NOT DETECTED
Tetrahydrocannabinol: POSITIVE — AB

## 2015-11-23 LAB — T4, FREE: Free T4: 0.89 ng/dL (ref 0.61–1.12)

## 2015-11-23 LAB — GLUCOSE, CAPILLARY: GLUCOSE-CAPILLARY: 133 mg/dL — AB (ref 65–99)

## 2015-11-23 LAB — LIPASE, BLOOD: LIPASE: 35 U/L (ref 11–51)

## 2015-11-23 LAB — TROPONIN I
TROPONIN I: 0.03 ng/mL — AB (ref <0.03)
TROPONIN I: 0.03 ng/mL — AB (ref <0.03)
Troponin I: 0.05 ng/mL (ref <0.03)

## 2015-11-23 LAB — MRSA PCR SCREENING: MRSA by PCR: NEGATIVE

## 2015-11-23 LAB — TSH: TSH: 0.837 u[IU]/mL (ref 0.350–4.500)

## 2015-11-23 MED ORDER — INFLUENZA VAC SPLIT QUAD 0.5 ML IM SUSY
0.5000 mL | PREFILLED_SYRINGE | INTRAMUSCULAR | Status: AC
  Administered 2015-11-24: 0.5 mL via INTRAMUSCULAR

## 2015-11-23 MED ORDER — ENOXAPARIN SODIUM 40 MG/0.4ML ~~LOC~~ SOLN
40.0000 mg | SUBCUTANEOUS | Status: DC
  Administered 2015-11-23 – 2015-11-24 (×2): 40 mg via SUBCUTANEOUS
  Filled 2015-11-23 (×2): qty 0.4

## 2015-11-23 MED ORDER — ASPIRIN 81 MG PO CHEW
81.0000 mg | CHEWABLE_TABLET | Freq: Every day | ORAL | Status: DC
  Administered 2015-11-23 – 2015-11-25 (×3): 81 mg via ORAL
  Filled 2015-11-23 (×3): qty 1

## 2015-11-23 MED ORDER — MORPHINE SULFATE (PF) 4 MG/ML IV SOLN
INTRAVENOUS | Status: AC
  Filled 2015-11-23: qty 1

## 2015-11-23 MED ORDER — CLONIDINE HCL 0.3 MG/24HR TD PTWK
0.3000 mg | MEDICATED_PATCH | TRANSDERMAL | Status: DC
  Administered 2015-11-23: 0.3 mg via TRANSDERMAL
  Filled 2015-11-23: qty 1

## 2015-11-23 MED ORDER — HYDRALAZINE HCL 50 MG PO TABS
50.0000 mg | ORAL_TABLET | Freq: Three times a day (TID) | ORAL | Status: DC
  Administered 2015-11-23 – 2015-11-25 (×6): 50 mg via ORAL
  Filled 2015-11-23 (×6): qty 1

## 2015-11-23 MED ORDER — AMLODIPINE BESYLATE 10 MG PO TABS
10.0000 mg | ORAL_TABLET | Freq: Every day | ORAL | Status: DC
  Administered 2015-11-23 – 2015-11-25 (×3): 10 mg via ORAL
  Filled 2015-11-23 (×3): qty 1

## 2015-11-23 MED ORDER — PNEUMOCOCCAL VAC POLYVALENT 25 MCG/0.5ML IJ INJ
0.5000 mL | INJECTION | INTRAMUSCULAR | Status: AC
  Administered 2015-11-24: 0.5 mL via INTRAMUSCULAR
  Filled 2015-11-23: qty 0.5

## 2015-11-23 MED ORDER — TECHNETIUM TC 99M MEBROFENIN IV KIT
5.0000 | PACK | Freq: Once | INTRAVENOUS | Status: AC | PRN
  Administered 2015-11-23: 5 via INTRAVENOUS

## 2015-11-23 MED ORDER — MORPHINE SULFATE (PF) 4 MG/ML IV SOLN
2.6000 mg | Freq: Once | INTRAVENOUS | Status: AC
  Administered 2015-11-23: 2.6 mg via INTRAVENOUS

## 2015-11-23 NOTE — Progress Notes (Signed)
Patient has been transferred to [facility] room 13, with monitor on and the family members were present at the bedside prior to transfer. Report was given to the nurse.

## 2015-11-23 NOTE — ED Notes (Signed)
Attempted report x1. 

## 2015-11-23 NOTE — Progress Notes (Addendum)
[facility] PROGRESS NOTE  Christine Hurley [id number] DOB: 08-17-1950 DOA: 11/22/2015   PCP: Berkley Harvey, NP     Assessment/Plan: Principal Problem:   Hypertensive urgency Active Problems:   HEPATITIS C   COPD (chronic obstructive pulmonary disease) (HCC)   Non-intractable vomiting with nausea   Elevated LFTs   Alcohol abuse   65 y.o. female with a history of Hepatitis C, COPD, and alcohol abuse (2 beers a day) presented with nausea, vomiting, and weakness. Patient states this evening she had an episode of nausea and vomiting followed by weakness came to the ER. While in the ER, patients blood pressure was over [id number] diastolic. She stated she hasn't been compliant with her blood pressure medications. Patients labs show elevated LFTs  and tiny gallstones. On exam , CT of abd shows tiny gallstones with epigastric tenderness. IV labetalol was given for her blood pressure and she was admitted for hypertensive urgency and cholelithiasis. Patient admits to drinking alcohol this morning.   Assessment and plan   1. Hypertensive urgency - Patient has been non compliant with blood pressure medications. Patient initially placed on NTG drip and hydralazine as needed. Will start patient on Norvasc, clonidine patch, oral hydralazine. Check UDS, taper off nitroglycerin drip as quickly as possible  , Abnormal troponin, echo to r/o wall motion abnormalities, will start ASA 2. Cholelithiasis - Patient has nausea, vomiting, and elevated LFTS. Check HIDA scan. Patient will be NPO and on antibiotics.   HIDA scan negative. Ultrasound just shows a tiny gallstone. Liver function improving 3. History of hepatits C- Patient stated she has not been treated before. Follow LFTs  4. Alcohol abuse. Patient is on CIWA protocol.  5. COPD-stable. Chest x-ray negative this admission   DVT prophylaxsis Lovenox  Code Status:  Full code    Family Communication: Discussed in detail with the  patient, all imaging results, lab results explained to the patient   Disposition Plan: Transfer to telemetry     Consultants:  None  Procedures:  None  Antibiotics: Anti-infectives    Start     Dose/Rate Route Frequency Ordered Stop   11/23/15 2300  cefTRIAXone (ROCEPHIN) 2 g in dextrose 5 % 50 mL IVPB     2 g 100 mL/hr over 30 Minutes Intravenous Every 24 hours 11/22/15 2210     11/22/15 2045  cefTRIAXone (ROCEPHIN) 2 g in dextrose 5 % 50 mL IVPB     2 g 100 mL/hr over 30 Minutes Intravenous  Once 11/22/15 2035 11/22/15 2120         HPI/Subjective: Minimal abdominal pain, minimal nausea  Objective: Vitals:   11/23/15 0545 11/23/15 0600 11/23/15 0705 11/23/15 0815  BP: 151/70 123/68 (!) 143/87 (!) 156/78  Pulse: 104 98  (!) 107  Resp: 19 21  18   Temp:    99.3 F (37.4 C)  TempSrc:    Oral  SpO2: 97% 97%  98%  Weight:        Intake/Output Summary (Last 24 hours) at 11/23/15 0927 Last data filed at 11/23/15 0849  Gross per 24 hour  Intake             1050 ml  Output              780 ml  Net              270 ml    Exam:  Examination:  General exam: Appears calm and comfortable  Respiratory system:  Clear to auscultation. Respiratory effort normal. Cardiovascular system: S1 & S2 heard, RRR. No JVD, murmurs, rubs, gallops or clicks. No pedal edema. Gastrointestinal system: Abdomen is nondistended, soft and nontender. No organomegaly or masses felt. Normal bowel sounds heard. Central nervous system: Alert and oriented. No focal neurological deficits. Extremities: Symmetric 5 x 5 power. Skin: No rashes, lesions or ulcers Psychiatry: Judgement and insight appear normal. Mood & affect appropriate.     Data Reviewed: I have personally reviewed following labs and imaging studies  Micro Results No results found for this or any previous visit (from the past 240 hour(s)).  Radiology Reports Dg Chest 2 View  Result Date: 11/22/2015 CLINICAL DATA:  Mental  status changes.  Fatigue. EXAM: CHEST  2 VIEW COMPARISON:  05/18/2013. FINDINGS: AP and lateral views of the chest were obtained. Cardiomediastinal prominence likely accentuated by AP technique and slight rightward patient rotation. Lungs are clear. The visualized bony structures of the thorax are intact. IMPRESSION: No acute cardiopulmonary findings. Electronically Signed   By: Misty Stanley M.D.   On: 11/22/2015 17:56   US Abdomen Limited Ruq  Result Date: 11/22/2015 CLINICAL DATA:  65 year old female with abdominal pain. Hepatitis-C. Hypertension. Initial encounter. EXAM: US ABDOMEN LIMITED - RIGHT UPPER QUADRANT COMPARISON:  03/08/2012 ultrasound. FINDINGS: Gallbladder: Tiny gallstones without gallbladder wall thickening or pericholecystic fluid. No tenderness over gallbladder during scanning per ultrasound technologist. Common bile duct: Diameter: 3.9 mm Liver: No focal lesion identified. Within normal limits in parenchymal echogenicity. IMPRESSION: Tiny gallstones without ultrasound evidence of cholecystitis. Electronically Signed   By: Genia Del M.D.   On: 11/22/2015 19:03     CBC  Recent Labs Lab 11/22/15 1625 11/23/15 0658  WBC 5.1 7.7  HGB 15.3* 14.0  HCT 44.0 40.2  PLT 245 234  MCV 91.3 90.3  MCH 31.7 31.5  MCHC 34.8 34.8  RDW 12.8 12.7    Chemistries   Recent Labs Lab 11/22/15 1625 11/23/15 0658  NA 138 134*  K 3.7 3.6  CL 103 102  CO2 23 21*  GLUCOSE 140* 164*  BUN 12 9  CREATININE 0.81 0.74  CALCIUM 10.1 9.1  AST 146* 95*  ALT 169* 130*  ALKPHOS 68 56  BILITOT 0.8 0.8   ------------------------------------------------------------------------------------------------------------------ CrCl cannot be calculated (Unknown ideal weight.). ------------------------------------------------------------------------------------------------------------------ No results for input(s): HGBA1C in the last 72  hours. ------------------------------------------------------------------------------------------------------------------ No results for input(s): CHOL, HDL, LDLCALC, TRIG, CHOLHDL, LDLDIRECT in the last 72 hours. ------------------------------------------------------------------------------------------------------------------ No results for input(s): TSH, T4TOTAL, T3FREE, THYROIDAB in the last 72 hours.  Invalid input(s): FREET3 ------------------------------------------------------------------------------------------------------------------ No results for input(s): VITAMINB12, FOLATE, FERRITIN, TIBC, IRON, RETICCTPCT in the last 72 hours.  Coagulation profile No results for input(s): INR, PROTIME in the last 168 hours.  No results for input(s): DDIMER in the last 72 hours.  Cardiac Enzymes  Recent Labs Lab 11/22/15 2205 11/23/15 0658  TROPONINI <0.03 0.05*   ------------------------------------------------------------------------------------------------------------------ Invalid input(s): POCBNP   CBG:  Recent Labs Lab 11/23/15 0001  GLUCAP 190*       Studies: Dg Chest 2 View  Result Date: 11/22/2015 CLINICAL DATA:  Mental status changes.  Fatigue. EXAM: CHEST  2 VIEW COMPARISON:  05/18/2013. FINDINGS: AP and lateral views of the chest were obtained. Cardiomediastinal prominence likely accentuated by AP technique and slight rightward patient rotation. Lungs are clear. The visualized bony structures of the thorax are intact. IMPRESSION: No acute cardiopulmonary findings. Electronically Signed   By: Misty Stanley M.D.   On: 11/22/2015 17:56  US Abdomen Limited Ruq  Result Date: 11/22/2015 CLINICAL DATA:  65 year old female with abdominal pain. Hepatitis-C. Hypertension. Initial encounter. EXAM: US ABDOMEN LIMITED - RIGHT UPPER QUADRANT COMPARISON:  03/08/2012 ultrasound. FINDINGS: Gallbladder: Tiny gallstones without gallbladder wall thickening or pericholecystic  fluid. No tenderness over gallbladder during scanning per ultrasound technologist. Common bile duct: Diameter: 3.9 mm Liver: No focal lesion identified. Within normal limits in parenchymal echogenicity. IMPRESSION: Tiny gallstones without ultrasound evidence of cholecystitis. Electronically Signed   By: Genia Del M.D.   On: 11/22/2015 19:03      No results found for: HGBA1C Lab Results  Component Value Date   MICROALBUR 2.77 (H) 11/06/2009   LDLCALC 57 11/08/2008   CREATININE 0.74 11/23/2015       Scheduled Meds: . amLODipine  10 mg Oral Daily  . cefTRIAXone (ROCEPHIN)  IV  2 g Intravenous Q24H  . cloNIDine  0.3 mg Transdermal Weekly  . folic acid  1 mg Oral Daily  . hydrALAZINE  50 mg Oral Q8H  . LORazepam  0-4 mg Intravenous Q6H   Followed by  . [START ON 11/25/2015] LORazepam  0-4 mg Intravenous Q12H  . multivitamin with minerals  1 tablet Oral Daily  . thiamine  100 mg Oral Daily   Or  . thiamine  100 mg Intravenous Daily   Continuous Infusions: . sodium chloride 50 mL/hr at 11/23/15 0849  . nitroGLYCERIN 135 mcg/min (11/23/15 [id number])     LOS: 1 day    Time spent: >30 MINS    [facility] Pager [phone]. If 7PM-7AM, please contact night-coverage at [email or web address], password [facility] 11/23/2015, 9:27 AM  LOS: 1 day

## 2015-11-23 NOTE — Progress Notes (Signed)
CRITICAL VALUE ALERT  Critical value received:  Troponin level 0.05  Date of notification:  11/23/2015  Time of notification:  0906  Critical value read back:Yes.    Nurse who received alert:  Kashana Breach  MD notified (1st page):  Dr. Allyson Sabal  Time of first page:  0915  MD notified (2nd page):  Time of second page:  Responding MD:  Awaiting.   Time MD responded:  Awaiting.

## 2015-11-23 NOTE — ED Notes (Signed)
Md Hal Hope gave verbal order to give 10 mg hydralazine

## 2015-11-23 NOTE — Progress Notes (Signed)
1730 Received pt from [place] via wheelchair. Pt is A&O x4.  Mildly anxious, noticeable hand tremors noted. Ativan given. Will cont to monitor for CIWA.

## 2015-11-23 NOTE — Care Management Note (Signed)
Case Management Note  Patient Details  Name: Christine Hurley MRN: [id number] Date of Birth: Aug 04, 1950  Subjective/Objective:  Presents with N/V and weakness, hypertensive urgency, patient is for hida scan today, npt.  PTA indep, she states her boyfriend lives with her, plan is to go home at discharge.  NCM will cont to follow for dc needs.                  Action/Plan:   Expected Discharge Date:                  Expected Discharge Plan:  Home/Self Care  In-House Referral:     Discharge planning Services  CM Consult  Post Acute Care Choice:    Choice offered to:     DME Arranged:    DME Agency:     HH Arranged:    HH Agency:     Status of Service:  In process, will continue to follow  If discussed at Long Length of Stay Meetings, dates discussed:    Additional Comments:  Zenon Mayo, RN 11/23/2015, 11:54 AM

## 2015-11-24 ENCOUNTER — Encounter (HOSPITAL_COMMUNITY): Payer: Self-pay

## 2015-11-24 ENCOUNTER — Inpatient Hospital Stay (HOSPITAL_COMMUNITY): Payer: Medicare Other

## 2015-11-24 DIAGNOSIS — R1010 Upper abdominal pain, unspecified: Secondary | ICD-10-CM

## 2015-11-24 DIAGNOSIS — R079 Chest pain, unspecified: Secondary | ICD-10-CM

## 2015-11-24 LAB — CBC
HEMATOCRIT: 39.9 % (ref 36.0–46.0)
Hemoglobin: 13.3 g/dL (ref 12.0–15.0)
MCH: 31 pg (ref 26.0–34.0)
MCHC: 33.3 g/dL (ref 30.0–36.0)
MCV: 93 fL (ref 78.0–100.0)
PLATELETS: 210 10*3/uL (ref 150–400)
RBC: 4.29 MIL/uL (ref 3.87–5.11)
RDW: 13.4 % (ref 11.5–15.5)
WBC: 6.5 10*3/uL (ref 4.0–10.5)

## 2015-11-24 LAB — COMPREHENSIVE METABOLIC PANEL
ALBUMIN: 3.3 g/dL — AB (ref 3.5–5.0)
ALT: 98 U/L — AB (ref 14–54)
AST: 71 U/L — AB (ref 15–41)
Alkaline Phosphatase: 49 U/L (ref 38–126)
Anion gap: 9 (ref 5–15)
BILIRUBIN TOTAL: 1.2 mg/dL (ref 0.3–1.2)
BUN: 12 mg/dL (ref 6–20)
CO2: 25 mmol/L (ref 22–32)
CREATININE: 0.73 mg/dL (ref 0.44–1.00)
Calcium: 8.9 mg/dL (ref 8.9–10.3)
Chloride: 104 mmol/L (ref 101–111)
GFR calc Af Amer: >60 mL/min (ref >60)
GFR calc non Af Amer: >60 mL/min (ref >60)
GLUCOSE: 94 mg/dL (ref 65–99)
POTASSIUM: 3.5 mmol/L (ref 3.5–5.1)
Sodium: 138 mmol/L (ref 135–145)
TOTAL PROTEIN: 6.9 g/dL (ref 6.5–8.1)

## 2015-11-24 LAB — ECHOCARDIOGRAM COMPLETE: WEIGHTICAEL: 2317.48 [oz_av]

## 2015-11-24 LAB — GLUCOSE, CAPILLARY
GLUCOSE-CAPILLARY: 114 mg/dL — AB (ref 65–99)
GLUCOSE-CAPILLARY: 116 mg/dL — AB (ref 65–99)
GLUCOSE-CAPILLARY: 125 mg/dL — AB (ref 65–99)
Glucose-Capillary: 104 mg/dL — ABNORMAL HIGH (ref 65–99)
Glucose-Capillary: 107 mg/dL — ABNORMAL HIGH (ref 65–99)

## 2015-11-24 LAB — HEMOGLOBIN A1C
Hgb A1c MFr Bld: 5.6 % (ref 4.8–5.6)
Mean Plasma Glucose: 114 mg/dL

## 2015-11-24 LAB — HEPATITIS PANEL, ACUTE
HEP B S AG: NEGATIVE
Hep A IgM: NEGATIVE
Hep B C IgM: NEGATIVE

## 2015-11-24 LAB — MAGNESIUM: Magnesium: 2.1 mg/dL (ref 1.7–2.4)

## 2015-11-24 MED ORDER — POTASSIUM CHLORIDE IN NACL 20-0.9 MEQ/L-% IV SOLN
INTRAVENOUS | Status: AC
  Administered 2015-11-24: 12:00:00 via INTRAVENOUS
  Filled 2015-11-24 (×2): qty 1000

## 2015-11-24 NOTE — Progress Notes (Signed)
  Echocardiogram 2D Echocardiogram has been performed.  Jennette Dubin 11/24/2015, 11:00 AM

## 2015-11-24 NOTE — Progress Notes (Signed)
[facility] PROGRESS NOTE  Christine Hurley [id number] DOB: 1950-08-21 DOA: 11/22/2015   PCP: Berkley Harvey, NP     Assessment/Plan: Principal Problem:   Hypertensive urgency Active Problems:   HEPATITIS C   COPD (chronic obstructive pulmonary disease) (HCC)   Non-intractable vomiting with nausea   Elevated LFTs   Alcohol abuse   65 y.o. female with a history of Hepatitis C, COPD, and alcohol abuse (2 beers a day) presented with nausea, vomiting, and weakness. Patient states this evening she had an episode of nausea and vomiting followed by weakness came to the ER. While in the ER, patients blood pressure was over [id number] diastolic. She stated she hasn't been compliant with her blood pressure medications. Patients labs show elevated LFTs  and tiny gallstones. On exam , CT of abd shows tiny gallstones with epigastric tenderness. IV labetalol was given for her blood pressure and she was admitted for hypertensive urgency and cholelithiasis. Patient admits to drinking alcohol this morning.   Assessment and plan  1. Hypertensive urgency - Patient has been non compliant with blood pressure medications. Patient initially placed on NTG drip and hydralazine as needed. Started Norvasc, clonidine patch, oral hydralazine. UDS positive for marijuana, discontinued nitroglycerin and transferred patient to telemetry  , Abnormal troponin, echo to r/o wall motion abnormalities pending, started on baby aspirin. 2. Asymptomatic Cholelithiasis - Patient has nausea, vomiting, and elevated LFTS. Negative right upper quadrant ultrasound and HIDA scan. Advanced to soft diet.   Liver function within normal limits. 3. History of hepatits C- Patient stated she has not been treated before. Would need to establish with infectious disease for this 4. Alcohol abuse. Patient is on CIWA protocol. Would likely go into withdrawal this admission. Continue thiamine and folic acid 5. COPD-stable. Chest x-ray  negative this admission 6. Fever initial chest x-ray, UA negative, will repeat chest x-ray, consider stopping Rocephin if workup is negative   DVT prophylaxsis Lovenox  Code Status:  Full code    Family Communication: Discussed in detail with the patient, all imaging results, lab results explained to the patient   Disposition Plan: Continued telemetry     Consultants:  None  Procedures:  None  Antibiotics: Anti-infectives    Start     Dose/Rate Route Frequency Ordered Stop   11/23/15 2300  cefTRIAXone (ROCEPHIN) 2 g in dextrose 5 % 50 mL IVPB     2 g 100 mL/hr over 30 Minutes Intravenous Every 24 hours 11/22/15 2210     11/22/15 2045  cefTRIAXone (ROCEPHIN) 2 g in dextrose 5 % 50 mL IVPB     2 g 100 mL/hr over 30 Minutes Intravenous  Once 11/22/15 2035 11/22/15 2120         HPI/Subjective: Minimal abdominal pain, minimal nausea  Objective: Vitals:   11/23/15 2054 11/24/15 0017 11/24/15 0222 11/24/15 0501  BP: (!) 142/88  133/78 (!) 153/82  Pulse: 94  80 86  Resp: 17  18 18   Temp: 98.7 F (37.1 C)  100 F (37.8 C)   TempSrc: Oral  Oral Oral  SpO2: 98%  94% 99%  Weight:  65.7 kg (144 lb 13.5 oz)      Intake/Output Summary (Last 24 hours) at 11/24/15 0951 Last data filed at 11/23/15 2100  Gross per 24 hour  Intake              600 ml  Output  600 ml  Net                0 ml    Exam:  Examination:  General exam: Appears calm and comfortable  Respiratory system: Clear to auscultation. Respiratory effort normal. Cardiovascular system: S1 & S2 heard, RRR. No JVD, murmurs, rubs, gallops or clicks. No pedal edema. Gastrointestinal system: Abdomen is nondistended, soft and nontender. No organomegaly or masses felt. Normal bowel sounds heard. Central nervous system: Alert and oriented. No focal neurological deficits. Extremities: Symmetric 5 x 5 power. Skin: No rashes, lesions or ulcers Psychiatry: Judgement and insight appear normal. Mood &  affect appropriate.     Data Reviewed: I have personally reviewed following labs and imaging studies  Micro Results Recent Results (from the past 240 hour(s))  MRSA PCR Screening     Status: None   Collection Time: 11/23/15  6:58 AM  Result Value Ref Range Status   MRSA by PCR NEGATIVE NEGATIVE Final    Comment:        The GeneXpert MRSA Assay (FDA approved for NASAL specimens only), is one component of a comprehensive MRSA colonization surveillance program. It is not intended to diagnose MRSA infection nor to guide or monitor treatment for MRSA infections.     Radiology Reports Dg Chest 2 View  Result Date: 11/22/2015 CLINICAL DATA:  Mental status changes.  Fatigue. EXAM: CHEST  2 VIEW COMPARISON:  05/18/2013. FINDINGS: AP and lateral views of the chest were obtained. Cardiomediastinal prominence likely accentuated by AP technique and slight rightward patient rotation. Lungs are clear. The visualized bony structures of the thorax are intact. IMPRESSION: No acute cardiopulmonary findings. Electronically Signed   By: Misty Stanley M.D.   On: 11/22/2015 17:56   Nm Hepato W/eject Fract  Result Date: 11/23/2015 CLINICAL DATA:  Right upper quadrant abdominal pain with nausea and vomiting. Chronic hepatitis-C. EXAM: NUCLEAR MEDICINE HEPATOBILIARY IMAGING TECHNIQUE: Sequential images of the abdomen were obtained out to 60 minutes following intravenous administration of radiopharmaceutical. Because the gallbladder was not visualized at 60 minutes, additional imaging was performed for 30 minutes following administration of 2.6 mg morphine intravenously. RADIOPHARMACEUTICALS:  6 mCi Tc-67m  Choletec IV COMPARISON:  None. FINDINGS: Prompt uptake and biliary excretion of activity by the liver is seen. Biliary activity promptly passes into small bowel, consistent with patent common bile duct. No gallbladder filling was seen during the initial 60 minutes of imaging, however gallbladder filling  with activity is seen falling administration of intravenous morphine. IMPRESSION: Delayed gallbladder filling with activity following intravenous morphine administration. No evidence of cystic duct obstruction. No evidence of biliary ductal obstruction. Electronically Signed   By: Earle Gell M.D.   On: 11/23/2015 16:10   US Abdomen Limited Ruq  Result Date: 11/22/2015 CLINICAL DATA:  65 year old female with abdominal pain. Hepatitis-C. Hypertension. Initial encounter. EXAM: US ABDOMEN LIMITED - RIGHT UPPER QUADRANT COMPARISON:  03/08/2012 ultrasound. FINDINGS: Gallbladder: Tiny gallstones without gallbladder wall thickening or pericholecystic fluid. No tenderness over gallbladder during scanning per ultrasound technologist. Common bile duct: Diameter: 3.9 mm Liver: No focal lesion identified. Within normal limits in parenchymal echogenicity. IMPRESSION: Tiny gallstones without ultrasound evidence of cholecystitis. Electronically Signed   By: Genia Del M.D.   On: 11/22/2015 19:03     CBC  Recent Labs Lab 11/22/15 1625 11/23/15 0658 11/24/15 0746  WBC 5.1 7.7 6.5  HGB 15.3* 14.0 13.3  HCT 44.0 40.2 39.9  PLT 245 234 210  MCV 91.3 90.3 93.0  MCH  31.7 31.5 31.0  MCHC 34.8 34.8 33.3  RDW 12.8 12.7 13.4    Chemistries   Recent Labs Lab 11/22/15 1625 11/23/15 0658 11/24/15 0746  NA 138 134* 138  K 3.7 3.6 3.5  CL 103 102 104  CO2 23 21* 25  GLUCOSE 140* 164* 94  BUN 12 9 12   CREATININE 0.81 0.74 0.73  CALCIUM 10.1 9.1 8.9  AST 146* 95* 71*  ALT 169* 130* 98*  ALKPHOS 68 56 49  BILITOT 0.8 0.8 1.2   ------------------------------------------------------------------------------------------------------------------ CrCl cannot be calculated (Unknown ideal weight.). ------------------------------------------------------------------------------------------------------------------  Recent Labs  11/23/15 0658  HGBA1C 5.6    ------------------------------------------------------------------------------------------------------------------ No results for input(s): CHOL, HDL, LDLCALC, TRIG, CHOLHDL, LDLDIRECT in the last 72 hours. ------------------------------------------------------------------------------------------------------------------  Recent Labs  11/23/15 0658  TSH 0.837   ------------------------------------------------------------------------------------------------------------------ No results for input(s): VITAMINB12, FOLATE, FERRITIN, TIBC, IRON, RETICCTPCT in the last 72 hours.  Coagulation profile No results for input(s): INR, PROTIME in the last 168 hours.  No results for input(s): DDIMER in the last 72 hours.  Cardiac Enzymes  Recent Labs Lab 11/23/15 0658 11/23/15 1214 11/23/15 1925  TROPONINI 0.05* 0.03* 0.03*   ------------------------------------------------------------------------------------------------------------------ Invalid input(s): POCBNP   CBG:  Recent Labs Lab 11/23/15 0001 11/23/15 1735 11/24/15 0017 11/24/15 0630  GLUCAP 190* 133* 114* 125*       Studies: Dg Chest 2 View  Result Date: 11/22/2015 CLINICAL DATA:  Mental status changes.  Fatigue. EXAM: CHEST  2 VIEW COMPARISON:  05/18/2013. FINDINGS: AP and lateral views of the chest were obtained. Cardiomediastinal prominence likely accentuated by AP technique and slight rightward patient rotation. Lungs are clear. The visualized bony structures of the thorax are intact. IMPRESSION: No acute cardiopulmonary findings. Electronically Signed   By: Misty Stanley M.D.   On: 11/22/2015 17:56   Nm Hepato W/eject Fract  Result Date: 11/23/2015 CLINICAL DATA:  Right upper quadrant abdominal pain with nausea and vomiting. Chronic hepatitis-C. EXAM: NUCLEAR MEDICINE HEPATOBILIARY IMAGING TECHNIQUE: Sequential images of the abdomen were obtained out to 60 minutes following intravenous administration of  radiopharmaceutical. Because the gallbladder was not visualized at 60 minutes, additional imaging was performed for 30 minutes following administration of 2.6 mg morphine intravenously. RADIOPHARMACEUTICALS:  6 mCi Tc-38m  Choletec IV COMPARISON:  None. FINDINGS: Prompt uptake and biliary excretion of activity by the liver is seen. Biliary activity promptly passes into small bowel, consistent with patent common bile duct. No gallbladder filling was seen during the initial 60 minutes of imaging, however gallbladder filling with activity is seen falling administration of intravenous morphine. IMPRESSION: Delayed gallbladder filling with activity following intravenous morphine administration. No evidence of cystic duct obstruction. No evidence of biliary ductal obstruction. Electronically Signed   By: Earle Gell M.D.   On: 11/23/2015 16:10   US Abdomen Limited Ruq  Result Date: 11/22/2015 CLINICAL DATA:  65 year old female with abdominal pain. Hepatitis-C. Hypertension. Initial encounter. EXAM: US ABDOMEN LIMITED - RIGHT UPPER QUADRANT COMPARISON:  03/08/2012 ultrasound. FINDINGS: Gallbladder: Tiny gallstones without gallbladder wall thickening or pericholecystic fluid. No tenderness over gallbladder during scanning per ultrasound technologist. Common bile duct: Diameter: 3.9 mm Liver: No focal lesion identified. Within normal limits in parenchymal echogenicity. IMPRESSION: Tiny gallstones without ultrasound evidence of cholecystitis. Electronically Signed   By: Genia Del M.D.   On: 11/22/2015 19:03      Lab Results  Component Value Date   HGBA1C 5.6 11/23/2015   Lab Results  Component Value Date   MICROALBUR 2.77 (H) 11/06/2009  LDLCALC 57 11/08/2008   CREATININE 0.73 11/24/2015       Scheduled Meds: . amLODipine  10 mg Oral Daily  . aspirin  81 mg Oral Daily  . cefTRIAXone (ROCEPHIN)  IV  2 g Intravenous Q24H  . cloNIDine  0.3 mg Transdermal Weekly  . enoxaparin (LOVENOX) injection   40 mg Subcutaneous Q24H  . folic acid  1 mg Oral Daily  . hydrALAZINE  50 mg Oral Q8H  . Influenza vac split quadrivalent PF  0.5 mL Intramuscular Tomorrow-1000  . LORazepam  0-4 mg Intravenous Q6H   Followed by  . [START ON 11/25/2015] LORazepam  0-4 mg Intravenous Q12H  . multivitamin with minerals  1 tablet Oral Daily  . pneumococcal 23 valent vaccine  0.5 mL Intramuscular Tomorrow-1000  . thiamine  100 mg Oral Daily   Or  . thiamine  100 mg Intravenous Daily   Continuous Infusions:     LOS: 2 days    Time spent: >30 MINS    [facility]  [facility] Pager [phone]. If 7PM-7AM, please contact night-coverage at [email or web address], password [facility] 11/24/2015, 9:51 AM  LOS: 2 days

## 2015-11-25 DIAGNOSIS — R7989 Other specified abnormal findings of blood chemistry: Secondary | ICD-10-CM

## 2015-11-25 LAB — COMPREHENSIVE METABOLIC PANEL
ALT: 99 U/L — AB (ref 14–54)
AST: 86 U/L — AB (ref 15–41)
Albumin: 3.2 g/dL — ABNORMAL LOW (ref 3.5–5.0)
Alkaline Phosphatase: 47 U/L (ref 38–126)
Anion gap: 8 (ref 5–15)
BILIRUBIN TOTAL: 1 mg/dL (ref 0.3–1.2)
BUN: 10 mg/dL (ref 6–20)
CALCIUM: 9 mg/dL (ref 8.9–10.3)
CO2: 23 mmol/L (ref 22–32)
CREATININE: 0.63 mg/dL (ref 0.44–1.00)
Chloride: 105 mmol/L (ref 101–111)
Glucose, Bld: 111 mg/dL — ABNORMAL HIGH (ref 65–99)
Potassium: 3.3 mmol/L — ABNORMAL LOW (ref 3.5–5.1)
Sodium: 136 mmol/L (ref 135–145)
TOTAL PROTEIN: 6.7 g/dL (ref 6.5–8.1)

## 2015-11-25 LAB — GLUCOSE, CAPILLARY: GLUCOSE-CAPILLARY: 107 mg/dL — AB (ref 65–99)

## 2015-11-25 MED ORDER — HYDRALAZINE HCL 50 MG PO TABS: 50.0000 mg | ORAL_TABLET | Freq: Three times a day (TID) | ORAL | 2 refills | Status: DC

## 2015-11-25 MED ORDER — AMLODIPINE BESYLATE 10 MG PO TABS: 10.0000 mg | ORAL_TABLET | Freq: Every day | ORAL | 2 refills | Status: DC

## 2015-11-25 MED ORDER — FOLIC ACID 1 MG PO TABS: 1.0000 mg | ORAL_TABLET | Freq: Every day | ORAL | 1 refills | Status: DC

## 2015-11-25 MED ORDER — THIAMINE HCL 100 MG PO TABS: 100.0000 mg | ORAL_TABLET | Freq: Every day | ORAL | 1 refills | Status: DC

## 2015-11-25 MED ORDER — CLONIDINE HCL 0.3 MG/24HR TD PTWK: 0.3000 mg | MEDICATED_PATCH | TRANSDERMAL | 12 refills | Status: DC

## 2015-11-25 MED ORDER — ASPIRIN 81 MG PO CHEW: 81.0000 mg | CHEWABLE_TABLET | Freq: Every day | ORAL | 1 refills | Status: DC

## 2015-11-25 NOTE — Evaluation (Signed)
Physical Therapy Evaluation & discharge Patient Details Name: Christine Hurley MRN: [id number] DOB: 10-06-1950 Today's Date: 11/25/2015   History of Present Illness  Christine Hurley is a 65 y.o. female with a history of Hepatitis C, COPD, and alcohol abuse (2 beers a day) presented with nausea, vomiting, and weakness. Patient states this evening she had an episode of nausea and vomiting followed by weakness came to the ER. While in the ER, patients blood pressure was over [id number] diastolic.  Clinical Impression  Pt moving well at S to MOD I level including flight of stairs and does not need any further skilled PT intervention.  Will sign off. Please re-order is pt's status changes.     Follow Up Recommendations No PT follow up    Equipment Recommendations  None recommended by PT    Recommendations for Other Services       Precautions / Restrictions Precautions Precautions: None      Mobility  Bed Mobility Overal bed mobility: Independent                Transfers Overall transfer level: Independent                  Ambulation/Gait Ambulation/Gait assistance: Modified independent (Device/Increase time);Supervision Ambulation Distance (Feet): 510 Feet Assistive device: None     Gait velocity interpretation: at or above normal speed for age/gender General Gait Details: MOD I with gait with S when pt got distracted and turning. Good speed and cadence  Stairs Stairs: Yes Stairs assistance: Modified independent (Device/Increase time) Stair Management: Alternating pattern;One rail Right Number of Stairs: 10 General stair comments: Ascent with no rail and descent with L rail  Wheelchair Mobility    Modified Rankin (Stroke Patients Only)       Balance Overall balance assessment: No apparent balance deficits (not formally assessed)                                           Pertinent Vitals/Pain Pain Assessment: No/denies pain    Home  Living Family/patient expects to be discharged to:: Private residence Living Arrangements: Alone   Type of Home: House Home Access: Stairs to enter Entrance Stairs-Rails: Can reach both Entrance Stairs-Number of Steps: 2-3 Home Layout: One level Home Equipment: None      Prior Function Level of Independence: Independent               Hand Dominance        Extremity/Trunk Assessment   Upper Extremity Assessment: Overall WFL for tasks assessed           Lower Extremity Assessment: Overall WFL for tasks assessed      Cervical / Trunk Assessment: Normal  Communication   Communication: No difficulties  Cognition Arousal/Alertness: Awake/alert Behavior During Therapy: WFL for tasks assessed/performed Overall Cognitive Status: Within Functional Limits for tasks assessed                      General Comments      Exercises     Assessment/Plan    PT Assessment Patent does not need any further PT services  PT Problem List            PT Treatment Interventions      PT Goals (Current goals can be found in the Care Plan section)  Acute Rehab PT Goals Patient Stated Goal: go  home today PT Goal Formulation: All assessment and education complete, DC therapy    Frequency     Barriers to discharge        Co-evaluation               End of Session   Activity Tolerance: Patient tolerated treatment well Patient left: in chair;with call bell/phone within reach           Time: 0923-0940 PT Time Calculation (min) (ACUTE ONLY): 17 min   Charges:   PT Evaluation $PT Eval Low Complexity: 1 Procedure     PT G Codes:        Christine Hurley LUBECK 11/25/2015, 9:45 AM

## 2015-11-25 NOTE — Progress Notes (Signed)
Discharge instructions gone over with patient. Home medications discussed. Prescriptions were electronically sent to pharmacy.  Follow up appointment is to be made. Diet, activity, and reasons to call the doctor discussed. Patient verbalized understanding of instructions.

## 2015-11-25 NOTE — Discharge Summary (Signed)
Physician Discharge Summary  Christine Hurley MRN: [id number] DOB/AGE: 08/23/1950 65 y.o.  PCP: Berkley Harvey, NP   Admit date: 11/22/2015 Discharge date: 11/25/2015  Discharge Diagnoses:    Principal Problem:   Hypertensive urgency Active Problems:   HEPATITIS C   COPD (chronic obstructive pulmonary disease) (HCC)   Non-intractable vomiting with nausea   Elevated LFTs   Alcohol abuse    Follow-up recommendations Follow-up with PCP in 3-5 days , including all  additional recommended appointments as below Follow-up CBC, CMP in 3-5 days       Current Discharge Medication List    START taking these medications   Details  amLODipine (NORVASC) 10 MG tablet Take 1 tablet (10 mg total) by mouth daily. Qty: 30 tablet, Refills: 2    aspirin 81 MG chewable tablet Chew 1 tablet (81 mg total) by mouth daily. Qty: 30 tablet, Refills: 1    cloNIDine (CATAPRES - DOSED IN MG/24 HR) 0.3 mg/24hr patch Place 1 patch (0.3 mg total) onto the skin once a week. Qty: 4 patch, Refills: 12    folic acid (FOLVITE) 1 MG tablet Take 1 tablet (1 mg total) by mouth daily. Qty: 30 tablet, Refills: 1    hydrALAZINE (APRESOLINE) 50 MG tablet Take 1 tablet (50 mg total) by mouth every 8 (eight) hours. Qty: 90 tablet, Refills: 2    thiamine 100 MG tablet Take 1 tablet (100 mg total) by mouth daily. Qty: 30 tablet, Refills: 1      CONTINUE these medications which have NOT CHANGED   Details  Nutritional Supplements (COLD AND FLU PO) Take 15 mLs by mouth daily as needed. For cold and flu symptoms    ondansetron (ZOFRAN ODT) 4 MG disintegrating tablet Take 1 tablet (4 mg total) by mouth every 8 (eight) hours as needed for nausea or vomiting. Qty: 10 tablet, Refills: 0      STOP taking these medications     lisinopril-hydrochlorothiazide (PRINZIDE,ZESTORETIC) 20-25 MG per tablet          Discharge Condition: stable  Discharge Instructions Get Medicines reviewed and adjusted: Please  take all your medications with you for your next visit with your Primary MD  Please request your Primary MD to go over all hospital tests and procedure/radiological results at the follow up, please ask your Primary MD to get all Hospital records sent to his/her office.  If you experience worsening of your admission symptoms, develop shortness of breath, life threatening emergency, suicidal or homicidal thoughts you must seek medical attention immediately by calling 911 or calling your MD immediately if symptoms less severe.  You must read complete instructions/literature along with all the possible adverse reactions/side effects for all the Medicines you take and that have been prescribed to you. Take any new Medicines after you have completely understood and accpet all the possible adverse reactions/side effects.   Do not drive when taking Pain medications.   Do not take more than prescribed Pain, Sleep and Anxiety Medications  Special Instructions: If you have smoked or chewed Tobacco in the last 2 yrs please stop smoking, stop any regular Alcohol and or any Recreational drug use.  Wear Seat belts while driving.  Please note  You were cared for by a hospitalist during your hospital stay. Once you are discharged, your primary care physician will handle any further medical issues. Please note that NO REFILLS for any discharge medications will be authorized once you are discharged, as it is imperative that you return to  your primary care physician (or establish a relationship with a primary care physician if you do not have one) for your aftercare needs so that they can reassess your need for medications and monitor your lab values.  Discharge Instructions    Diet - low sodium heart healthy    Complete by:  As directed    Increase activity slowly    Complete by:  As directed        No Known Allergies    Disposition: 01-Home or Self Care   Consults: None    Significant  Diagnostic Studies:  Dg Chest 2 View  Result Date: 11/24/2015 CLINICAL DATA:  Fever.  History of hypertension and smoking. EXAM: CHEST  2 VIEW COMPARISON:  11/22/2015; 05/18/2013; 04/12/2003 FINDINGS: Grossly unchanged cardiac silhouette and mediastinal contours with mild tortuosity and potential ectasia of the thoracic aorta. Mild perihilar predominant interstitial thickening. No focal airspace opacities. No pleural effusion or pneumothorax. No evidence of edema. No acute osseus abnormalities. Stigmata of DISH within the thoracic spine. IMPRESSION: Findings suggestive of airways disease / bronchitis. No focal airspace opacities to suggest pneumonia. Electronically Signed   By: Sandi Mariscal M.D.   On: 11/24/2015 11:04   Dg Chest 2 View  Result Date: 11/22/2015 CLINICAL DATA:  Mental status changes.  Fatigue. EXAM: CHEST  2 VIEW COMPARISON:  05/18/2013. FINDINGS: AP and lateral views of the chest were obtained. Cardiomediastinal prominence likely accentuated by AP technique and slight rightward patient rotation. Lungs are clear. The visualized bony structures of the thorax are intact. IMPRESSION: No acute cardiopulmonary findings. Electronically Signed   By: Misty Stanley M.D.   On: 11/22/2015 17:56   Nm Hepato W/eject Fract  Result Date: 11/23/2015 CLINICAL DATA:  Right upper quadrant abdominal pain with nausea and vomiting. Chronic hepatitis-C. EXAM: NUCLEAR MEDICINE HEPATOBILIARY IMAGING TECHNIQUE: Sequential images of the abdomen were obtained out to 60 minutes following intravenous administration of radiopharmaceutical. Because the gallbladder was not visualized at 60 minutes, additional imaging was performed for 30 minutes following administration of 2.6 mg morphine intravenously. RADIOPHARMACEUTICALS:  6 mCi Tc-75m Choletec IV COMPARISON:  None. FINDINGS: Prompt uptake and biliary excretion of activity by the liver is seen. Biliary activity promptly passes into small bowel, consistent with patent  common bile duct. No gallbladder filling was seen during the initial 60 minutes of imaging, however gallbladder filling with activity is seen falling administration of intravenous morphine. IMPRESSION: Delayed gallbladder filling with activity following intravenous morphine administration. No evidence of cystic duct obstruction. No evidence of biliary ductal obstruction. Electronically Signed   By: JEarle GellM.D.   On: 11/23/2015 16:10   [place]dom[place] Limited Ruq  Result Date: 11/22/2015 CLINICAL DATA:  65year old female with abdominal pain. Hepatitis-C. Hypertension. Initial encounter. EXAM: [place] LIMITED - RIGHT UPPER QUADRANT COMPARISON:  03/08/2012 ultrasound. FINDINGS: Gallbladder: Tiny gallstones without gallbladder wall thickening or pericholecystic fluid. No tenderness over gallbladder during scanning per ultrasound technologist. Common bile duct: Diameter: 3.9 mm Liver: No focal lesion identified. Within normal limits in parenchymal echogenicity. IMPRESSION: Tiny gallstones without ultrasound evidence of cholecystitis. Electronically Signed   By: SGenia DelM.D.   On: 11/22/2015 19:03       Filed Weights   11/24/15 0017 11/24/15 0758 11/25/15 0553  Weight: 65.7 kg (144 lb 13.5 oz) 65.7 kg (144 lb 13.5 oz) 69.5 kg (153 lb 3.5 oz)     Microbiology: Recent Results (from the past 240 hour(s))  MRSA PCR Screening  Status: None   Collection Time: 11/23/15  6:58 AM  Result Value Ref Range Status   MRSA by PCR NEGATIVE NEGATIVE Final    Comment:        The GeneXpert MRSA Assay (FDA approved for NASAL specimens only), is one component of a comprehensive MRSA colonization surveillance program. It is not intended to diagnose MRSA infection nor to guide or monitor treatment for MRSA infections.        Blood Culture No results found for: SDES, [place], CULT, REPTSTATUS    Labs: Results for orders placed or performed during the hospital encounter of 11/22/15 (from  the past 48 hour(s))  Troponin I     Status: Abnormal   Collection Time: 11/23/15 12:14 PM  Result Value Ref Range   Troponin I 0.03 (HH) <0.03 ng/mL    Comment: CRITICAL VALUE NOTED.  VALUE IS CONSISTENT WITH PREVIOUSLY REPORTED AND CALLED VALUE.  Lipase, blood     Status: None   Collection Time: 11/23/15 12:14 PM  Result Value Ref Range   Lipase 35 11 - 51 U/L  T4, free     Status: None   Collection Time: 11/23/15 12:14 PM  Result Value Ref Range   Free T4 0.89 0.61 - 1.12 ng/dL    Comment: (NOTE) Biotin ingestion may interfere with free T4 tests. If the results are inconsistent with the TSH level, previous test results, or the clinical presentation, then consider biotin interference. If needed, order repeat testing after stopping biotin.   Glucose, capillary     Status: Abnormal   Collection Time: 11/23/15  5:35 PM  Result Value Ref Range   Glucose-Capillary 133 (H) 65 - 99 mg/dL   Comment 1 Notify RN   Troponin I     Status: Abnormal   Collection Time: 11/23/15  7:25 PM  Result Value Ref Range   Troponin I 0.03 (HH) <0.03 ng/mL    Comment: CRITICAL VALUE NOTED.  VALUE IS CONSISTENT WITH PREVIOUSLY REPORTED AND CALLED VALUE.  Glucose, capillary     Status: Abnormal   Collection Time: 11/24/15 12:17 AM  Result Value Ref Range   Glucose-Capillary 114 (H) 65 - 99 mg/dL  Glucose, capillary     Status: Abnormal   Collection Time: 11/24/15  6:30 AM  Result Value Ref Range   Glucose-Capillary 125 (H) 65 - 99 mg/dL  Comprehensive metabolic panel     Status: Abnormal   Collection Time: 11/24/15  7:46 AM  Result Value Ref Range   Sodium 138 135 - 145 mmol/L   Potassium 3.5 3.5 - 5.1 mmol/L   Chloride 104 101 - 111 mmol/L   CO2 25 22 - 32 mmol/L   Glucose, Bld 94 65 - 99 mg/dL   BUN 12 6 - 20 mg/dL   Creatinine, Ser 0.73 0.44 - 1.00 mg/dL   Calcium 8.9 8.9 - 10.3 mg/dL   Total Protein 6.9 6.5 - 8.1 g/dL   Albumin 3.3 (L) 3.5 - 5.0 g/dL   AST 71 (H) 15 - 41 U/L   ALT 98 (H)  14 - 54 U/L   Alkaline Phosphatase 49 38 - 126 U/L   Total Bilirubin 1.2 0.3 - 1.2 mg/dL   GFR calc non Af Amer >60 >60 mL/min   GFR calc Af Amer >60 >60 mL/min    Comment: (NOTE) The eGFR has been calculated using the CKD EPI equation. This calculation has not been validated in all clinical situations. eGFR's persistently <60 mL/min signify possible Chronic Kidney  Disease.    Anion gap 9 5 - 15  CBC     Status: None   Collection Time: 11/24/15  7:46 AM  Result Value Ref Range   WBC 6.5 4.0 - 10.5 K/uL   RBC 4.29 3.87 - 5.11 MIL/uL   Hemoglobin 13.3 12.0 - 15.0 g/dL   HCT 39.9 36.0 - 46.0 %   MCV 93.0 78.0 - 100.0 fL   MCH 31.0 26.0 - 34.0 pg   MCHC 33.3 30.0 - 36.0 g/dL   RDW 13.4 11.5 - 15.5 %   Platelets 210 150 - 400 K/uL  Magnesium     Status: None   Collection Time: 11/24/15 10:52 AM  Result Value Ref Range   Magnesium 2.1 1.7 - 2.4 mg/dL  Glucose, capillary     Status: Abnormal   Collection Time: 11/24/15 11:45 AM  Result Value Ref Range   Glucose-Capillary 104 (H) 65 - 99 mg/dL   Comment 1 Notify RN   Glucose, capillary     Status: Abnormal   Collection Time: 11/24/15  5:49 PM  Result Value Ref Range   Glucose-Capillary 116 (H) 65 - 99 mg/dL   Comment 1 Notify RN    Comment 2 Document in Chart   Glucose, capillary     Status: Abnormal   Collection Time: 11/24/15 11:58 PM  Result Value Ref Range   Glucose-Capillary 107 (H) 65 - 99 mg/dL  Comprehensive metabolic panel     Status: Abnormal   Collection Time: 11/25/15  5:12 AM  Result Value Ref Range   Sodium 136 135 - 145 mmol/L   Potassium 3.3 (L) 3.5 - 5.1 mmol/L   Chloride 105 101 - 111 mmol/L   CO2 23 22 - 32 mmol/L   Glucose, Bld 111 (H) 65 - 99 mg/dL   BUN 10 6 - 20 mg/dL   Creatinine, Ser 0.63 0.44 - 1.00 mg/dL   Calcium 9.0 8.9 - 10.3 mg/dL   Total Protein 6.7 6.5 - 8.1 g/dL   Albumin 3.2 (L) 3.5 - 5.0 g/dL   AST 86 (H) 15 - 41 U/L   ALT 99 (H) 14 - 54 U/L   Alkaline Phosphatase 47 38 - 126 U/L    Total Bilirubin 1.0 0.3 - 1.2 mg/dL   GFR calc non Af Amer >60 >60 mL/min   GFR calc Af Amer >60 >60 mL/min    Comment: (NOTE) The eGFR has been calculated using the CKD EPI equation. This calculation has not been validated in all clinical situations. eGFR's persistently <60 mL/min signify possible Chronic Kidney Disease.    Anion gap 8 5 - 15  Glucose, capillary     Status: Abnormal   Collection Time: 11/25/15  5:52 AM  Result Value Ref Range   Glucose-Capillary 107 (H) 65 - 99 mg/dL     Lipid Panel     Component Value Date/Time   CHOL 143 11/08/2008 0000   TRIG 166 (H) 11/08/2008 0000   HDL 53 11/08/2008 0000   CHOLHDL 2.7 Ratio 11/08/2008 0000   VLDL 33 11/08/2008 0000   LDLCALC 57 11/08/2008 0000     Lab Results  Component Value Date   HGBA1C 5.6 11/23/2015     ECHO LV EF: 60% -   65%  ------------------------------------------------------------------- Indications:      Chest pain 786.51.  ------------------------------------------------------------------- History:   PMH:   Chronic obstructive pulmonary disease.  Risk factors:  Alcohol abuse. Former tobacco use. Hypertension.  ------------------------------------------------------------------- Study Conclusions  -  Left ventricle: The cavity size was normal. Wall thickness was   increased in a pattern of mild LVH. Systolic function was normal.   The estimated ejection fraction was in the range of 60% to 65%.   Wall motion was normal; there were no regional wall motion   abnormalities. Doppler parameters are consistent with abnormal   left ventricular relaxation (grade 1 diastolic dysfunction).HO    HPI 65 y.o.femalewith a historyof Hepatitis C, COPD, and alcohol abuse (2 beers a day) presented with nausea, vomiting, and weakness. Patient states this evening she had an episode of nausea and vomiting followed by weakness came to the ER. While in the ER, patients blood pressure was over  200diastolic. She stated she hasn't been compliant with her blood pressure medications. Patients labs show elevated LFTs and tiny gallstones. On exam ,  USG of abd shows tiny gallstones withepigastric tenderness. IV labetalol was given for her blood pressure and she was admitted for hypertensive urgency and cholelithiasis. Patient admits to drinking alcohol this morning.   Assessment and plan  Hypertensive urgency - Patient has been non compliant with blood pressure medications. Patient initially placed on NTG drip and hydralazine as needed. Started Norvasc, clonidine patch, oral hydralazine. UDS positive for marijuana, discontinued nitroglycerin and transferred patient to telemetry , slightly Abnormal troponin 0.05>0.03 in the setting of hypertensive urgency, echo Systolic function was normal.  The estimated ejection fraction was in the range of 60% to 65%.  Asymptomatic Cholelithiasis - Patient has nausea,vomiting,and elevated LFTS. Negative right upper quadrant ultrasound and HIDA scan. Advanced to soft diet.   Liver function within normal limits.  History of hepatits C- Patient stated she has not been treated before. Would need to establish with infectious disease for this  Alcohol abuse.Patient is on CIWA protocol. Minimal withdrawal this admission. Continue thiamine and folic acid  COPD-stable. Chest x-ray negative this admission  Fever initial chest x-ray, UA negative,   repeat chest x-ray 10/14 also negative  , consider stopping Rocephin if workup is negative     Discharge Exam:  Blood pressure (!) 149/99, pulse 98, temperature 99.9 F (37.7 C), temperature source Oral, resp. rate 16, height 5' 6"  (1.676 m), weight 69.5 kg (153 lb 3.5 oz), SpO2 100 %.  General exam: Appears calm and comfortable  Respiratory system: Clear to auscultation. Respiratory effort normal. Cardiovascular system: S1 & S2 heard, RRR. No JVD, murmurs, rubs, gallops or clicks. No pedal  edema. Gastrointestinal system: Abdomen is nondistended, soft and nontender. No organomegaly or masses felt. Normal bowel sounds heard. Central nervous system: Alert and oriented. No focal neurological deficits. Extremities: Symmetric 5 x 5 power. Skin: No rashes, lesions or ulcers Psychiatry: Judgement and insight appear normal. Mood & affect appropriate.    Follow-up Information    Berkley Harvey, NP. Schedule an appointment as soon as possible for a visit today.   Specialty:  Nurse Practitioner Why:  hospital follow up Contact information: [place] [address] [phone]           Signed: Reyne Dumas 11/25/2015, 12:12 PM        Time spent >45 mins

## 2015-11-25 NOTE — Care Management Note (Signed)
Case Management Note  Patient Details  Name: Christine Hurley MRN: [id number] Date of Birth: [birth year]  Subjective/Objective:                  Nausea, vomiting, and weakness/ETOH Action/Plan: Discharge planning  Expected Discharge Date:                  Expected Discharge Plan:  Home/Self Care  In-House Referral:     Discharge planning Services  CM Consult, Medication Assistance  Post Acute Care Choice:  NA Choice offered to:  NA  DME Arranged:  N/A DME Agency:  NA  HH Arranged:  NA HH Agency:  NA  Status of Service:  Completed, signed off  If discussed at [place] of Stay Meetings, dates discussed:    Additional Comments: CM spoke with pt concerning medication asst (per MD consult).  Pt has [place] and Medicaid; pt confirmed.  Pt states she has no trouble affording her medication.  No other CM needs were communicated. Dellie Catholic, RN 11/25/2015, 12:47 PM

## 2016-01-02 ENCOUNTER — Other Ambulatory Visit (HOSPITAL_COMMUNITY): Payer: Self-pay | Admitting: Gastroenterology

## 2016-01-02 DIAGNOSIS — B1920 Unspecified viral hepatitis C without hepatic coma: Secondary | ICD-10-CM

## 2016-01-16 ENCOUNTER — Ambulatory Visit (HOSPITAL_COMMUNITY): Admission: RE | Admit: 2016-01-16 | Payer: Medicare Other | Source: Ambulatory Visit

## 2016-01-23 LAB — HM MAMMOGRAPHY

## 2016-03-19 ENCOUNTER — Other Ambulatory Visit (HOSPITAL_COMMUNITY): Payer: Self-pay | Admitting: Gastroenterology

## 2016-03-19 DIAGNOSIS — B1920 Unspecified viral hepatitis C without hepatic coma: Secondary | ICD-10-CM

## 2016-03-26 ENCOUNTER — Ambulatory Visit (HOSPITAL_COMMUNITY)
Admission: RE | Admit: 2016-03-26 | Discharge: 2016-03-26 | Disposition: A | Discharge status: Home / Self Care | Payer: Medicare Other | Source: Ambulatory Visit | Attending: Gastroenterology | Admitting: Gastroenterology

## 2016-03-26 DIAGNOSIS — R918 Other nonspecific abnormal finding of lung field: Secondary | ICD-10-CM | POA: Insufficient documentation

## 2016-03-26 DIAGNOSIS — N281 Cyst of kidney, acquired: Secondary | ICD-10-CM | POA: Diagnosis not present

## 2016-03-26 DIAGNOSIS — B1920 Unspecified viral hepatitis C without hepatic coma: Secondary | ICD-10-CM | POA: Diagnosis present

## 2016-03-26 DIAGNOSIS — K8020 Calculus of gallbladder without cholecystitis without obstruction: Secondary | ICD-10-CM | POA: Insufficient documentation

## 2016-04-22 ENCOUNTER — Emergency Department (HOSPITAL_COMMUNITY)
Admission: EM | Admit: 2016-04-22 | Discharge: 2016-04-23 | Disposition: A | Discharge status: Home / Self Care | Payer: Medicare Other | Attending: Emergency Medicine | Admitting: Emergency Medicine

## 2016-04-22 ENCOUNTER — Encounter (HOSPITAL_COMMUNITY): Payer: Self-pay | Admitting: *Deleted

## 2016-04-22 DIAGNOSIS — R109 Unspecified abdominal pain: Secondary | ICD-10-CM | POA: Insufficient documentation

## 2016-04-22 DIAGNOSIS — Z5321 Procedure and treatment not carried out due to patient leaving prior to being seen by health care provider: Secondary | ICD-10-CM | POA: Insufficient documentation

## 2016-04-22 LAB — URINALYSIS, ROUTINE W REFLEX MICROSCOPIC
Bilirubin Urine: NEGATIVE
GLUCOSE, UA: NEGATIVE mg/dL
Ketones, ur: 5 mg/dL — AB
Leukocytes, UA: NEGATIVE
Nitrite: NEGATIVE
PH: 5 (ref 5.0–8.0)
Protein, ur: 100 mg/dL — AB
SPECIFIC GRAVITY, URINE: 1.018 (ref 1.005–1.030)

## 2016-04-22 LAB — COMPREHENSIVE METABOLIC PANEL
ALBUMIN: 4.2 g/dL (ref 3.5–5.0)
ALK PHOS: 63 U/L (ref 38–126)
ALT: 100 U/L — AB (ref 14–54)
AST: 91 U/L — ABNORMAL HIGH (ref 15–41)
Anion gap: 14 (ref 5–15)
BILIRUBIN TOTAL: 0.9 mg/dL (ref 0.3–1.2)
BUN: 11 mg/dL (ref 6–20)
CALCIUM: 9.9 mg/dL (ref 8.9–10.3)
CO2: 20 mmol/L — AB (ref 22–32)
CREATININE: 0.73 mg/dL (ref 0.44–1.00)
Chloride: 101 mmol/L (ref 101–111)
GFR calc non Af Amer: >60 mL/min (ref >60)
GLUCOSE: 215 mg/dL — AB (ref 65–99)
Potassium: 3.4 mmol/L — ABNORMAL LOW (ref 3.5–5.1)
SODIUM: 135 mmol/L (ref 135–145)
TOTAL PROTEIN: 9 g/dL — AB (ref 6.5–8.1)

## 2016-04-22 LAB — LIPASE, BLOOD: Lipase: 35 U/L (ref 11–51)

## 2016-04-22 LAB — CBC
HCT: 42.1 % (ref 36.0–46.0)
Hemoglobin: 14.8 g/dL (ref 12.0–15.0)
MCH: 31.6 pg (ref 26.0–34.0)
MCHC: 35.2 g/dL (ref 30.0–36.0)
MCV: 90 fL (ref 78.0–100.0)
PLATELETS: 304 10*3/uL (ref 150–400)
RBC: 4.68 MIL/uL (ref 3.87–5.11)
RDW: 12.7 % (ref 11.5–15.5)
WBC: 7.1 10*3/uL (ref 4.0–10.5)

## 2016-04-22 MED ORDER — ONDANSETRON 4 MG PO TBDP: 4.0000 mg | ORAL_TABLET | Freq: Once | ORAL | Status: DC | PRN

## 2016-04-22 MED ORDER — ONDANSETRON 4 MG PO TBDP
ORAL_TABLET | ORAL | Status: AC
  Filled 2016-04-22: qty 1

## 2016-04-22 NOTE — ED Triage Notes (Signed)
Pt c/o [facility] since yesterday with lower abd pain

## 2016-04-22 NOTE — ED Notes (Signed)
Pt vomiting grape juice. Offered zofran, pt states "I'll stay in pain and keep vomiting for now." Pt also wanting to leave, encouraged pt to stay since she's not been able to keep her home medications down

## 2016-04-23 NOTE — ED Notes (Signed)
Unable to locate pt  

## 2016-05-01 LAB — HM DEXA SCAN

## 2016-05-22 DIAGNOSIS — M8588 Other specified disorders of bone density and structure, other site: Secondary | ICD-10-CM

## 2016-05-22 DIAGNOSIS — M85859 Other specified disorders of bone density and structure, unspecified thigh: Secondary | ICD-10-CM | POA: Insufficient documentation

## 2016-05-22 HISTORY — DX: Other specified disorders of bone density and structure, other site: M85.88

## 2016-11-03 DIAGNOSIS — F17219 Nicotine dependence, cigarettes, with unspecified nicotine-induced disorders: Secondary | ICD-10-CM | POA: Insufficient documentation

## 2016-12-18 ENCOUNTER — Other Ambulatory Visit: Payer: Self-pay | Admitting: Physician Assistant

## 2016-12-18 DIAGNOSIS — E2839 Other primary ovarian failure: Secondary | ICD-10-CM

## 2017-02-24 ENCOUNTER — Other Ambulatory Visit: Payer: Self-pay | Admitting: Gastroenterology

## 2017-02-24 DIAGNOSIS — B1920 Unspecified viral hepatitis C without hepatic coma: Secondary | ICD-10-CM

## 2017-03-03 ENCOUNTER — Other Ambulatory Visit: Payer: Medicare Other

## 2017-03-03 ENCOUNTER — Ambulatory Visit
Admission: RE | Admit: 2017-03-03 | Discharge: 2017-03-03 | Disposition: A | Discharge status: Home / Self Care | Payer: Medicare Other | Source: Ambulatory Visit | Attending: Gastroenterology | Admitting: Gastroenterology

## 2017-03-03 DIAGNOSIS — B1920 Unspecified viral hepatitis C without hepatic coma: Secondary | ICD-10-CM

## 2017-03-04 ENCOUNTER — Other Ambulatory Visit: Payer: Medicare Other

## 2017-04-23 ENCOUNTER — Encounter (HOSPITAL_COMMUNITY): Payer: Self-pay | Admitting: Emergency Medicine

## 2017-04-23 ENCOUNTER — Emergency Department (HOSPITAL_COMMUNITY): Payer: Medicare Other

## 2017-04-23 ENCOUNTER — Other Ambulatory Visit: Payer: Self-pay

## 2017-04-23 ENCOUNTER — Emergency Department (HOSPITAL_COMMUNITY)
Admission: EM | Admit: 2017-04-23 | Discharge: 2017-04-23 | Disposition: A | Discharge status: Home / Self Care | Payer: Medicare Other | Attending: Emergency Medicine | Admitting: Emergency Medicine

## 2017-04-23 DIAGNOSIS — Z7982 Long term (current) use of aspirin: Secondary | ICD-10-CM | POA: Diagnosis not present

## 2017-04-23 DIAGNOSIS — J449 Chronic obstructive pulmonary disease, unspecified: Secondary | ICD-10-CM | POA: Diagnosis not present

## 2017-04-23 DIAGNOSIS — Z79899 Other long term (current) drug therapy: Secondary | ICD-10-CM | POA: Insufficient documentation

## 2017-04-23 DIAGNOSIS — Z87891 Personal history of nicotine dependence: Secondary | ICD-10-CM | POA: Insufficient documentation

## 2017-04-23 DIAGNOSIS — R079 Chest pain, unspecified: Secondary | ICD-10-CM | POA: Diagnosis not present

## 2017-04-23 DIAGNOSIS — R112 Nausea with vomiting, unspecified: Secondary | ICD-10-CM

## 2017-04-23 DIAGNOSIS — I10 Essential (primary) hypertension: Secondary | ICD-10-CM | POA: Diagnosis not present

## 2017-04-23 LAB — URINALYSIS, ROUTINE W REFLEX MICROSCOPIC
Bilirubin Urine: NEGATIVE
Glucose, UA: NEGATIVE mg/dL
Hgb urine dipstick: NEGATIVE
Ketones, ur: NEGATIVE mg/dL
Leukocytes, UA: NEGATIVE
Nitrite: NEGATIVE
Protein, ur: NEGATIVE mg/dL
Specific Gravity, Urine: 1.006 (ref 1.005–1.030)
pH: 7 (ref 5.0–8.0)

## 2017-04-23 LAB — LIPASE, BLOOD: Lipase: 45 U/L (ref 11–51)

## 2017-04-23 LAB — I-STAT CG4 LACTIC ACID, ED
Lactic Acid, Venous: 3.58 mmol/L (ref 0.5–1.9)
Lactic Acid, Venous: 2.38 mmol/L (ref 0.5–1.9)

## 2017-04-23 LAB — BASIC METABOLIC PANEL
Anion gap: 15 (ref 5–15)
BUN: 15 mg/dL (ref 6–20)
CO2: 17 mmol/L — ABNORMAL LOW (ref 22–32)
Calcium: 9.6 mg/dL (ref 8.9–10.3)
Chloride: 103 mmol/L (ref 101–111)
Creatinine, Ser: 0.8 mg/dL (ref 0.44–1.00)
GFR calc Af Amer: >60 mL/min (ref >60)
GFR calc non Af Amer: >60 mL/min (ref >60)
Glucose, Bld: 205 mg/dL — ABNORMAL HIGH (ref 65–99)
Potassium: 3.6 mmol/L (ref 3.5–5.1)
Sodium: 135 mmol/L (ref 135–145)

## 2017-04-23 LAB — RAPID URINE DRUG SCREEN, HOSP PERFORMED
Amphetamines: NOT DETECTED
BENZODIAZEPINES: NOT DETECTED
Barbiturates: NOT DETECTED
COCAINE: NOT DETECTED
OPIATES: NOT DETECTED
Tetrahydrocannabinol: POSITIVE — AB

## 2017-04-23 LAB — I-STAT TROPONIN, ED
TROPONIN I, POC: 0 ng/mL (ref 0.00–0.08)
Troponin i, poc: 0 ng/mL (ref 0.00–0.08)

## 2017-04-23 LAB — CBC
HCT: 41.2 % (ref 36.0–46.0)
Hemoglobin: 14.4 g/dL (ref 12.0–15.0)
MCH: 31.7 pg (ref 26.0–34.0)
MCHC: 35 g/dL (ref 30.0–36.0)
MCV: 90.7 fL (ref 78.0–100.0)
Platelets: 243 10*3/uL (ref 150–400)
RBC: 4.54 MIL/uL (ref 3.87–5.11)
RDW: 12.4 % (ref 11.5–15.5)
WBC: 3.7 10*3/uL — ABNORMAL LOW (ref 4.0–10.5)

## 2017-04-23 MED ORDER — ONDANSETRON HCL 4 MG/2ML IJ SOLN
4.0000 mg | Freq: Once | INTRAMUSCULAR | Status: AC
  Administered 2017-04-23: 4 mg via INTRAVENOUS
  Filled 2017-04-23: qty 2

## 2017-04-23 MED ORDER — AMLODIPINE BESYLATE 5 MG PO TABS
10.0000 mg | ORAL_TABLET | Freq: Every day | ORAL | Status: DC
  Administered 2017-04-23: 10 mg via ORAL
  Filled 2017-04-23: qty 2

## 2017-04-23 MED ORDER — PROMETHAZINE HCL 25 MG PO TABS: 25.0000 mg | ORAL_TABLET | Freq: Four times a day (QID) | ORAL | 0 refills | Status: DC | PRN

## 2017-04-23 MED ORDER — SODIUM CHLORIDE 0.9 % IV BOLUS (SEPSIS)
2000.0000 mL | Freq: Once | INTRAVENOUS | Status: AC
  Administered 2017-04-23: 2000 mL via INTRAVENOUS

## 2017-04-23 MED ORDER — LORAZEPAM 2 MG/ML IJ SOLN
0.5000 mg | Freq: Once | INTRAMUSCULAR | Status: AC
  Administered 2017-04-23: 0.5 mg via INTRAVENOUS
  Filled 2017-04-23: qty 1

## 2017-04-23 MED ORDER — HYDRALAZINE HCL 25 MG PO TABS
50.0000 mg | ORAL_TABLET | Freq: Three times a day (TID) | ORAL | Status: DC
  Administered 2017-04-23: 50 mg via ORAL
  Filled 2017-04-23: qty 2

## 2017-04-23 NOTE — ED Notes (Signed)
Lab results was reported to Nurse Roselyn Reef.

## 2017-04-23 NOTE — ED Notes (Signed)
Pt.s son came up to [facility] and reported that pt. Needs to lay down and is feeling worse.  Pt. Is actively vomiting.  Informed the Charge RN Santiago Glad,  Pt. Placed in room B 14 with the assistance of Tanya, EMT.  Family at the bedside.   Md at the Bedside.

## 2017-04-23 NOTE — ED Provider Notes (Signed)
Medical screening examination/treatment/procedure(s) were conducted as a shared visit with non-physician practitioner(s) and myself.  I personally evaluated the patient during the encounter.   EKG Interpretation  Date/Time:  Thursday April 23 2017 11:42:14 EDT Ventricular Rate:  65 PR Interval:    QRS Duration: 95 QT Interval:  468 QTC Calculation: 487 R Axis:   59 Text Interpretation:  Sinus rhythm Probable left atrial enlargement =no change from previous. no acute ischemic appearance. Confirmed by Charlesetta Shanks [id number]) on 04/23/2017 2:52:56 PM     Patient is alert.  She reports she feels much improved.  She has had no further vomiting and feels good to go home.  Patient had onset of abdominal pain with nausea and vomiting.  She began vomiting repeatedly.  Patient was initially evaluated and treated by Francee Piccolo.  At the time of my evaluation the patient is now asymptomatic.  She is very comfortable and in no distress.  Diagnostic results reviewed.  I agree with plan and management.     Charlesetta Shanks, MD 04/24/17 [phone]

## 2017-04-23 NOTE — ED Triage Notes (Signed)
Patient sent from urgent care for chest pain, patient complaining of chest pain tightness that started at 730 this morning. Received 1 SL nitro and 324mg  aspirin PTA, chest pain changed from 7/10 to currently 4/10. 18g saline lock in left AC. Patient diaphoretic. Patient states she stopped taking her blood pressure medicine three days ago because she ran out of pills.

## 2017-04-23 NOTE — ED Provider Notes (Signed)
[facility] EMERGENCY DEPARTMENT Provider Note   CSN: [id number] Arrival date & time: 04/23/17  1037     History   Chief Complaint Chief Complaint  Patient presents with  . Chest Pain    HPI Christine Hurley is a 67 y.o. female who presents the emergency department from an urgent care for chest pain, nausea and vomiting.  History is given by the patient and her boyfriend and son who are at bedside.  Her boyfriend states that this morning she called him from work.  She just eaten breakfast but she is began having severe abdominal pain.  She was also complaining of chest pain, tightness and pain in her left neck.  She has had history of hypertension and hypertensive urgency and in the past has been worked up with echo and Myoview with low risk for cardiac etiology of chest pain.  The patient is a daily smoker and does utilize marijuana very frequently.  Her boyfriend states that he came home and just as he arrived she began having severe abdominal pain with retching and vomiting profusely and has been vomiting intractably since he picked her up.  She was sent over to the emergency department for chest pain workup.  She states that she no longer has chest pain but does have pain in her abdomen.  She had a similar episode of this about a year ago and had to be admitted for intractable vomiting.  She denies fevers, chills, urinary symptoms.  HPI  Past Medical History:  Diagnosis Date  . Abnormal nuclear stress test February '13   Small, partially reversible inferolateral ischemia. Marked hypertensive response to exercise. Low risk.  . Chest pain with minimal risk for cardiac etiology February 2013   Evaluated with echocardiogram and Myoview, do  . Hepatitis C   . Hypertension   . Right tibial fracture      Patient Active Problem List   Diagnosis Date Noted  . Hypertensive urgency 11/22/2015  . Non-intractable vomiting with nausea 11/22/2015  . Elevated LFTs 11/22/2015    . Alcohol abuse 11/22/2015  . Abdominal pain   . COPD (chronic obstructive pulmonary disease) ([place]) 07/01/2013  . Hypertension 06/06/2013  . Dyspnea on exertion 06/01/2013  . Heart palpitations 07/17/2012  . Abnormal nuclear stress test   . Chest pain with minimal risk for cardiac etiology 03/14/2011  . VISUAL ACUITY, DECREASED 02/19/2009  . CERUMEN IMPACTION, BILATERAL 10/30/2008  . POSTMENOPAUSAL SYNDROME 10/30/2008  . OPEN FX MULTIPLE SITES PHALANX/PHALANGES HAND 09/23/2008  . CLOSED FRACTURE OF SHAFT OF FIBULA WITH TIBIA 09/23/2008  . TOBACCO ABUSE 03/24/2008  . DENTAL CARIES 03/24/2008  . HYPERTENSION, BENIGN ESSENTIAL 11/11/2007  . HEPATITIS C 11/04/2006    Past Surgical History:  Procedure Laterality Date  . DOPPLER ECHOCARDIOGRAPHY  03/26/2011   LVEF>55% normal LV wall thickness, normal LA, mild aortic sclerosis with trace to mild AI, mild to moderate TR, RVSP of 59mmHg, RA pressure about 5 mmHg  . NM MYOVIEW LTD  03/27/11   post stress ejection fraction is 72%, abnormal myocardial perfusion study, this is a low risk scan  . TIBIA FRACTURE SURGERY Right 09/2008   fracture of right lower leg with open reduction and internal fixation    OB History    No data available       Home Medications    Prior to Admission medications   Medication Sig Start Date End Date Taking? Authorizing Provider  amLODipine (NORVASC) 10 MG tablet Take 1 tablet (10  mg total) by mouth daily. 11/26/15   Reyne Dumas, MD  aspirin 81 MG chewable tablet Chew 1 tablet (81 mg total) by mouth daily. 11/26/15   Reyne Dumas, MD  cloNIDine (CATAPRES - DOSED IN MG/24 HR) 0.3 mg/24hr patch Place 1 patch (0.3 mg total) onto the skin once a week. 11/30/15   Reyne Dumas, MD  folic acid (FOLVITE) 1 MG tablet Take 1 tablet (1 mg total) by mouth daily. 11/26/15   Reyne Dumas, MD  hydrALAZINE (APRESOLINE) 50 MG tablet Take 1 tablet (50 mg total) by mouth every 8 (eight) hours. 11/25/15   Reyne Dumas,  MD  Nutritional Supplements (COLD AND FLU PO) Take 15 mLs by mouth daily as needed. For cold and flu symptoms    [provider]  ondansetron (ZOFRAN ODT) 4 MG disintegrating tablet Take 1 tablet (4 mg total) by mouth every 8 (eight) hours as needed for nausea or vomiting. Patient not taking: Reported on 11/22/2015 02/09/14   Fredia Sorrow, MD  thiamine 100 MG tablet Take 1 tablet (100 mg total) by mouth daily. 11/26/15   Reyne Dumas, MD    Family History Family History  Problem Relation Age of Onset  . Asthma Mother   . Cancer Mother        Brain  . Cancer - Prostate Father     Social History Social History   Tobacco Use  . Smoking status: Former Smoker    Packs/day: 0.25    Years: 35.00    Pack years: 8.75    Types: Cigarettes    Last attempt to quit: 09/10/2012    Years since quitting: 4.6  . Smokeless tobacco: Never Used  Substance Use Topics  . Alcohol use: Yes  . Drug use: Yes    Types: Marijuana     Allergies   Patient has no known allergies.   Review of Systems Review of Systems  Ten systems reviewed and are negative for acute change, except as noted in the HPI.   Physical Exam Updated Vital Signs BP (!) 147/96 (BP Location: Right Arm)   Pulse 93   Temp 97.6 F (36.4 C) (Oral)   Resp (!) 22   SpO2 100%   Physical Exam  Constitutional: She is oriented to person, place, and time. She appears well-developed and well-nourished. No distress.  Vomiting and retching actively.  HENT:  Head: Normocephalic and atraumatic.  Eyes: Conjunctivae are normal. No scleral icterus.  Neck: Normal range of motion.  Cardiovascular: Normal rate, regular rhythm and normal heart sounds. Exam reveals no gallop and no friction rub.  No murmur heard. Pulmonary/Chest: Effort normal and breath sounds normal. No respiratory distress.  Abdominal: Soft. Bowel sounds are normal. She exhibits no distension and no mass. There is no tenderness. There is no guarding.   Neurological: She is alert and oriented to person, place, and time.  Skin: Skin is warm and dry. She is not diaphoretic.  Psychiatric: Her behavior is normal.  Nursing note and vitals reviewed.    ED Treatments / Results  Labs (all labs ordered are listed, but only abnormal results are displayed) Labs Reviewed  BASIC METABOLIC PANEL - Abnormal; Notable for the following components:      Result Value   CO2 17 (*)    Glucose, Bld 205 (*)    All other components within normal limits  CBC - Abnormal; Notable for the following components:   WBC 3.7 (*)    All other components within normal limits  I-STAT CG4 LACTIC ACID, ED - Abnormal; Notable for the following components:   Lactic Acid, Venous 3.58 (*)    All other components within normal limits  LIPASE, BLOOD  URINALYSIS, ROUTINE W REFLEX MICROSCOPIC  RAPID URINE DRUG SCREEN, HOSP PERFORMED  I-STAT TROPONIN, ED    EKG  EKG Interpretation  Date/Time:  Thursday April 23 2017 11:42:14 EDT Ventricular Rate:  65 PR Interval:    QRS Duration: 95 QT Interval:  468 QTC Calculation: 487 R Axis:   59 Text Interpretation:  Sinus rhythm Probable left atrial enlargement =no change from previous. no acute ischemic appearance. Confirmed by Charlesetta Shanks [phone]) on 04/23/2017 2:52:56 PM       Radiology Dg Chest 2 View  Result Date: 04/23/2017 CLINICAL DATA:  Chest pain and chest tightness since this morning EXAM: CHEST - 2 VIEW COMPARISON:  November 24, 2015 FINDINGS: The heart size and mediastinal contours are stable. The aorta is tortuous. There is no focal infiltrate, pulmonary edema, or pleural effusion. The visualized skeletal structures are unremarkable. IMPRESSION: No active cardiopulmonary disease. Electronically Signed   By: Abelardo Diesel M.D.   On: 04/23/2017 11:19    Procedures Procedures (including critical care time)  Medications Ordered in ED Medications  LORazepam (ATIVAN) injection 0.5 mg (not administered)   ondansetron (ZOFRAN) injection 4 mg (4 mg Intravenous Given 04/23/17 1146)  sodium chloride 0.9 % bolus 2,000 mL (2,000 mLs Intravenous New Bag/Given 04/23/17 1147)     Initial Impression / Assessment and Plan / ED Course  I have reviewed the triage vital signs and the nursing notes.  Pertinent labs & imaging results that were available during my care of the patient were reviewed by me and considered in my medical decision making (see chart for details).  Clinical Course as of Apr 23 1752  Thu Apr 23, 2017  1437 Patient has not had any repeat vomiting and is currently drinking water.   [AH]    Clinical Course User Index [AH] Margarita Mail, PA-C    Patient given half milligram of Ativan and Zofran.  She has been given fluids.  Her lactic acid is elevated however she did fits no other criteria for sepsis and I think this is secondary to heavy persistent vomiting.  Given fluid read resuscitation with a significant drop in her lactic acid although still somewhat elevated.  She has 2- troponins and an EKG that is not concerning for ischemia.  The patient has a history of long-term cannabis abuse and in my differential is cannabis hyperemesis syndrome since based on her appearance, repeat of similar symptoms.  Patient states that she is feeling much better.  She is tolerating p.o. fluids.  Her labs are otherwise unremarkable.  Continues to have hypertension.  Unsure if the patient is wearing her clonidine patch will need to put it on when she gets home.  I discussed this with the patient who appears much better.  She has a benign abdominal exam.  She was seen and shared visit with Dr. Johnney Killian who agrees the patient appears safe for discharge at this time.  Final Clinical Impressions(s) / ED Diagnoses   Final diagnoses:  Non-intractable vomiting with nausea, unspecified vomiting type  Chest pain, unspecified type    ED Discharge Orders    None       Margarita Mail, PA-C 04/23/17  1759    Charlesetta Shanks, MD 04/24/17 1313

## 2017-04-23 NOTE — Discharge Instructions (Signed)
Please continue to drink plenty of fluids. You are still dehydrated. Continue frequent small sips (10-20 ml) of clear liquids every 5-10 minutes. Gatorade or powerade are good options. Avoid milk, orange juice, and grape juice for now. . Once you have not had further vomiting with the small sips for 4 hours, you may begin to drink larger volumes of fluids at a time and try a bland diet which may include saltine crackers, applesauce, breads, pastas, bananas, bland chicken. If you continues to vomit despite medication, return to the ED for repeat evaluation. Otherwise, follow up with your doctor in 2-3 days for a re-check.  What is cannabinoid hyperemesis syndrome? Cannabinoid hyperemesis syndrome (CHS) is a condition that leads to repeated and severe bouts of vomiting. It occurs in long-term users of marijuana (cannabis). Usually marijuana and other cannabinoids decrease nausea and vomiting, but in some high frequency long-term users, marijuana has the opposite (paradoxical)effect.  Marijuana is the dried leaves, flowers, stems, and seeds from the Cannabis sativa plant. Hashish is made from the concentrated resins of the Cannabis satvia's female flower. Marijuana has several active substances. These include tetrahydrocannabinol (THC) and related chemicals. These bind to molecules found in the brain. That causes the drug ?high? and other effects that users feel.  Your digestive tract also has a number of molecules that bind to [facility] and related substances. So marijuana also affects the digestive tract. For example, the drug can alter the time it takes the stomach to empty. It also affects the esophageal sphincter. That?s the tight band of muscle that opens and closes to let food from the esophagus into the stomach. Long-term marijuana use can change the way the affected molecules respond and lead to the symptoms of CHS.  Marijuana is the most widely used illegal drug in the [place] adults are the most  frequent users. A small portion of these people develop CHS. It usually only happens in people who have regularly used marijuana for several years. Often CHS affects those who use the drug at least once a day.  What causes cannabinoid hyperemesis syndrome? Marijuana has very complex effects on the body. Experts are still trying to learn exactly how it causes CHS in some people.  In the brain, marijuana often has the opposite effect of CHS. It helps prevent nausea and vomiting. The drug is also good at stopping such symptoms in people having chemotherapy.  In the digestive tract, marijuana seems to have the opposite effect. It actually makes you more likely to have nausea and vomiting. With first use, the signals from the brain may be more important. That may lead to anti-nausea effects at first. But with repeated use of marijuana, certain receptors in the brain may stop responding to the drug in the same way. That may cause the repeated bouts of vomiting found in people with CHS.  It still isn?t clear why some heavy marijuana users get the syndrome. But others do not.  What are the symptoms of cannabinoid hyperemesis syndrome? People with CHS suffer from repeated bouts of vomiting. In between these episodes are times without any symptoms.  There may be months to years in between bouts of hyperemesis (severe vomiting).  The hyperemetic phase is next. Symptoms during this time may include:  Ongoing nausea Repeated episodes of vomiting Abdominal pain Decreased food intake and weight loss Symptoms of dehydration During this phase, vomiting is often intense and overwhelming. Many people take a lot of hot showers during the day. They find that  doing so eases their nausea. (That may be because of the effects of the hot temperature on a part of the brain called the hypothalamus. It has effects on both temperature regulation and vomiting.) People often first seek medical care during this phase.  The  hyperemetic phase may continue until the person completely stops using marijuana. Then the recovery phrase starts. During this time, symptoms go away. Normal eating is possible again. This phase can last days to months. Symptoms usually come back if the person tries marijuana again.  How is cannabinoid hyperemesis syndrome diagnosed? Many health problems can cause repeated vomiting. To make a diagnosis, your healthcare provider will ask you about your symptoms and your past health. He or she will also do a physical exam, including an exam of your abdomen.  Your healthcare provider may also need more tests to rule out other causes of the vomiting. That?s especially the case for ones that may signal a health emergency. Based on your other symptoms, these tests might include:  Blood tests for anemia and infection Tests for electrolytes Tests for pancreas and liver enzymes, to check these organs Pregnancy test Urine analysis, to test for infection or other urinary causes Drug screen, to test for drug-related causes of vomiting X-rays of the abdomen, to check for conditions like blockage Upper endoscopy, to view the stomach and esophagus for possible causes of vomiting Head CT scan, if a nervous system cause of vomiting seems likely Abdominal CT scan, to check for health problems that might need surgery CHS was only recently discovered. So some healthcare providers may not know about it. As a result, they may fail to spot it for many years. They often confuse CHS with cyclical vomiting disorder. It?s a health problem that causes similar symptoms. A gastroenterologist might make the diagnosis.  How is cannabinoid hyperemesis syndrome treated? If you have had severe vomiting, you might need to stay in the hospital for a short time. During the hyperemesis phase, you might need these treatments:  Fluid replacement for dehydration, given through an IV Medicines to help decrease vomiting Pain  medicine Proton-pump inhibitors, to treat stomach inflammation Frequent hot showers Symptoms often ease after a day or two unless marijuana is used before this time.  To fully recover, you need to stop using marijuana all together. Some people may get help from drug rehabilitation programs to help them quit. Cognitive behavioral therapy or family therapy can also help. If you stop using marijuana, your symptoms should not come back.  What are the complications of cannabinoid hyperemesis syndrome? Very severe, prolonged vomiting may lead to dehydration. It may also lead to electrolyte problems in your blood. If untreated, these can cause rare complications such as:  Brain swelling (cerebral edema) Muscle spasms or weakness Seizures Kidney failure Heart rhythm abnormalities Shock Your healthcare team will quickly work to fix any dehydration or electrolyte problems. Doing so can help prevent these problems.  What can I do to prevent cannabinoid hyperemesis syndrome? You can prevent CHS by not using marijuana in any form. You may be reluctant to believe that marijuana might be the underlying cause of your symptoms. That may be because you have used it for many years without having any problems. The syndrome may take several years to develop. The drug may help prevent nausea in new users who don?t use it often. But people with CHS need to completely quit using it. If they don?t, their symptoms will likely come back.  Quitting marijuana may lead  to other health benefits, like:  Better lung function Improved memory and thinking skills Better sleep Decreased risk for depression and anxiety When should I call my healthcare provider? Call your healthcare provider if you have had severe vomiting for a day or more. Key points about cannabinoid hyperemesis syndrome CHS is a condition that leads to repeated and severe bouts of vomiting. It results from long-term use of marijuana. Most people  self-treat using hot showers to help reduce their symptoms. Some people with the syndrome may not be diagnosed for several years. Admitting to your healthcare provider that you use marijuana daily can significantly speed up the diagnosis and possibly avoid many invasive and expensive tests. You might need to stay in the hospital to treat dehydration from [facility]. Symptoms start to go away within a day or two after stopping marijuana use. In people with CHS, symptoms almost always come back if they use the drug again. Next steps Tips to help you get the most from a visit to your healthcare provider:  Know the reason for your visit and what you want to happen. Before your visit, write down questions you want answered. Bring someone with you to help you ask questions and remember what your provider tells you. At the visit, write down the name of a new diagnosis, and any new medicines, treatments, or tests. Also write down any new instructions your provider gives you. Know why a new medicine or treatment is prescribed, and how it will help you. Also know what the side effects are. Ask if your condition can be treated in other ways. Know why a test or procedure is recommended and what the results could mean. Know what to expect if you do not take the medicine or have the test or procedure. If you have a follow-up appointment, write down the date, time, and purpose for that visit. Know how you can contact your provider if you have questions.

## 2017-06-03 ENCOUNTER — Ambulatory Visit: Payer: Medicare Other | Admitting: Family Medicine

## 2017-06-05 ENCOUNTER — Telehealth: Payer: Self-pay | Admitting: Family Medicine

## 2017-06-05 NOTE — Telephone Encounter (Signed)
Pt has been rescheduled for 5/9 @ 1:00.

## 2017-06-05 NOTE — Telephone Encounter (Signed)
Copied from [place] [id number]. Topic: Quick Communication - Appointment Cancellation >> Jun 03, 2017  3:54 PM Ether Griffins B wrote: Patient called to cancel appointment scheduled for 06/03/17. Patient has not rescheduled their appointment.  Please call pt back to reschedule. She couldn't make todays appt due to waking up with a horrible toothache and couldn't talk so she went to the dentist instead. CB# [phone]   Route to department's PEC pool. >> Jun 03, 2017  4:35 PM Dillard, Jerelene Redden, CMA wrote: Patient wanting to reschedule.  Routing to Dr Jonni Sanger for approval >> Jun 04, 2017 11:53 AM Ahmed Prima L wrote: Patient called in and wanted to re-sch her appointment with Dr Jonni Sanger from 4/24. I advised her that she would be contacted for re-schedule per approval of Dr Jonni Sanger  May reschedule once. Thanks.

## 2017-06-18 ENCOUNTER — Ambulatory Visit (INDEPENDENT_AMBULATORY_CARE_PROVIDER_SITE_OTHER): Payer: Medicare HMO | Admitting: Family Medicine

## 2017-06-18 ENCOUNTER — Other Ambulatory Visit: Payer: Self-pay

## 2017-06-18 ENCOUNTER — Encounter: Payer: Self-pay | Admitting: *Deleted

## 2017-06-18 ENCOUNTER — Encounter: Payer: Self-pay | Admitting: Family Medicine

## 2017-06-18 VITALS — BP 118/80 | HR 65 | Temp 98.8°F | Ht 64.0 in | Wt 150.0 lb

## 2017-06-18 DIAGNOSIS — F1290 Cannabis use, unspecified, uncomplicated: Secondary | ICD-10-CM | POA: Insufficient documentation

## 2017-06-18 DIAGNOSIS — B182 Chronic viral hepatitis C: Secondary | ICD-10-CM | POA: Diagnosis not present

## 2017-06-18 DIAGNOSIS — M85852 Other specified disorders of bone density and structure, left thigh: Secondary | ICD-10-CM

## 2017-06-18 DIAGNOSIS — R7309 Other abnormal glucose: Secondary | ICD-10-CM | POA: Diagnosis not present

## 2017-06-18 DIAGNOSIS — J410 Simple chronic bronchitis: Secondary | ICD-10-CM

## 2017-06-18 DIAGNOSIS — F17219 Nicotine dependence, cigarettes, with unspecified nicotine-induced disorders: Secondary | ICD-10-CM

## 2017-06-18 DIAGNOSIS — I10 Essential (primary) hypertension: Secondary | ICD-10-CM | POA: Diagnosis not present

## 2017-06-18 LAB — BASIC METABOLIC PANEL
BUN: 15 mg/dL (ref 6–23)
CO2: 28 mEq/L (ref 19–32)
CREATININE: 0.75 mg/dL (ref 0.40–1.20)
Calcium: 9.7 mg/dL (ref 8.4–10.5)
Chloride: 101 mEq/L (ref 96–112)
GFR: 99.06 mL/min (ref >60.00)
GLUCOSE: 96 mg/dL (ref 70–99)
POTASSIUM: 4.1 meq/L (ref 3.5–5.1)
Sodium: 136 mEq/L (ref 135–145)

## 2017-06-18 LAB — LIPID PANEL
CHOLESTEROL: 247 mg/dL — AB (ref 0–200)
HDL: 84.4 mg/dL (ref >39.00)
LDL Cholesterol: 140 mg/dL — ABNORMAL HIGH (ref 0–99)
NONHDL: 163.05
Total CHOL/HDL Ratio: 3
Triglycerides: 117 mg/dL (ref 0.0–149.0)
VLDL: 23.4 mg/dL (ref 0.0–40.0)

## 2017-06-18 LAB — POCT GLYCOSYLATED HEMOGLOBIN (HGB A1C): Hemoglobin A1C: 5.7

## 2017-06-18 MED ORDER — AMLODIPINE BESYLATE 10 MG PO TABS: 10.0000 mg | ORAL_TABLET | Freq: Every day | ORAL | 2 refills | Status: DC

## 2017-06-18 MED ORDER — PNEUMOCOCCAL 13-VAL CONJ VACC IM SUSP: 0.5000 mL | Freq: Once | INTRAMUSCULAR | 0 refills | Status: AC

## 2017-06-18 MED ORDER — HYDRALAZINE HCL 50 MG PO TABS: 50.0000 mg | ORAL_TABLET | Freq: Three times a day (TID) | ORAL | 2 refills | Status: DC

## 2017-06-18 MED ORDER — LISINOPRIL-HYDROCHLOROTHIAZIDE 20-12.5 MG PO TABS: 2.0000 | ORAL_TABLET | Freq: Every day | ORAL | 1 refills | Status: DC

## 2017-06-18 NOTE — Patient Instructions (Addendum)
Thank you for establishing and allowing me to continue caring for you. It means a lot to me.   Please go to the Lab for blood work.    If you have MyChart, your results will be available to view, please respond through [place] with questions.  We will schedule follow-up according to results.   Please schedule a follow up appointment with me in 6 months for your Physical.  Medicare recommends an Annual Wellness Visit for all patients. Please schedule this to be done with our Nurse Educator, Maudie Mercury. This is an informative "talk" visit; it's goals are to ensure that your health care needs are being met and to give you education regarding avoiding falls, ensuring you are not suffering from depression or problems with memory or thinking, and to educate you on Advance Care Planning. It helps me take good care of you!

## 2017-06-18 NOTE — Progress Notes (Signed)
Subjective  CC:  Chief Complaint  Patient presents with  . Establish Care    Transfer from [place], Dr. Julieanne Manson, last physical 12/15/2016, due for Pneumonia Vaccine.     HPI: Christine Hurley is a 67 y.o. female who presents to [place] at [facility] today to establish care with me as a new patient. I reviewed her notes from the last several years in care everywhere. Sees GYN; had ER visit in march for cp/n/v - thought related to hypertension. Glucose at that time was > 200. Denies h/o diabetes or sxs of hyperglycemia.  She has the following concerns or needs:  HTN: has been well controlled since march on this new regimen. Needs refills. No AEs. No more cp. Feels good. CVR: smoker, not taking asa, HTN. Not interested in quitting smoking yet but has decreased use.   Copd: uses inhalers as needed. Denies sob. Smokes 2-3 cig/day. Marijuana use intermittently. Has seen pulm in the past.   Chronic hep c on harvoni; Dr. Paulita Fujita.no cirrhosis. Tolerating well. lfts has stabilized.   Osteopenia on chart; reports had recent bone density: need report. Not on calcium or vit D for unclear reasons.   Leg pain: has h/o tib fib frx and orif; uses marijuana for pain control.   Denies h/o alcohol abuse or addiction. Neg CAGE questions. Reports drinks a few beers on the weekend.   Lives alone; has grown children. Has a dog. Denies h/o mood disorder. No new concerns right now.   We updated and reviewed the patient's past history in detail and it is documented below.  Patient Active Problem List   Diagnosis Date Noted  . Cigarette nicotine dependence with nicotine-induced disorder 11/03/2016    Priority: High  . Essential hypertension 05/26/2013    Priority: High    Overview:  STORY: The goal for blood pressure is less than 140/90.  `E1o3L`If you are checking your blood pressure at home, please record it and bring it to your next office visit. Following the Dietary Approaches to Stop  Hypertension (DASH) diet (3 servings of fruit and vegetables daily, whole grains, low sodium, low-fat proteins) and exercising for 30 minutes 3-4 times per week is also generally recommended for those with high blood pressure.`E1o3L`IMPRESSION: avoid adding salt to diet, avoid processed meats. `E1o3L`con't lisinopril,,cont' norvasc, `E1o3L`bp stable today  Last Assessment & Plan:  Hypertension is improving with treatment. Continue current treatment regimen. Dietary sodium restriction. Regular aerobic exercise. Stop smoking. Blood pressure will be reassessed in 3 months  For health main and f/u.  The goal for blood pressure is less than 140/90.  `If you are checking your blood pressure at home, please record it and bring it to your next office visit. Following the Dietary Approaches to Stop Hypertension (DASH) diet (3 servings of fruit and vegetables daily, whole grains, low sodium, low-fat proteins) and exercising for 30 minutes 3-4 times. Overview:  Overview:  STORY: The goal for blood pressure is less than 140/90.  `E1o3L`If you are checking your blood pressure at home, please record it and bring it to your next office visit. Following the Dietary Approaches to Stop Hypertension (DASH) diet (3 servings of fruit and vegetables daily, whole grains, low sodium, low-fat proteins) and exercising for 30 minutes 3-4 times per week is also generally recommended for those with high blood pressure.`E1o3L`IMPRESSION: avoid adding salt to diet, avoid processed meats. `E1o3L`con't lisinopril,,cont' norvasc, `E1o3L`bp stable today  Last Assessment & Plan:  Hypertension is improving with treatment. Continue current  treatment regimen. Dietary sodium restriction. Regular aerobic exercise. Stop smoking. Blood pressure will be reassessed in 3 months  For health main and f/u.  The goal for blood pressure is less than 140/90.  `If you are checking your blood pressure at home, please record it and bring it to  your next office visit. Following the Dietary Approaches to Stop Hypertension (DASH) diet (3 servings of fruit and vegetables daily, whole grains, low sodium, low-fat proteins) and exercising for 30 minutes 3-4 times.   . Chronic hepatitis C without hepatic coma ([place]) 11/04/2006    Priority: High    Overview:  Overview:  Qualifier: Diagnosis of  By: Hassell Done FNP, Tori Milks  Overview:  Overview:  Overview:  Qualifier: Diagnosis of  By: Rebeca Alert    . Marijuana use, episodic 06/18/2017    Priority: Medium    Reports uses it for her leg pain   . Osteopenia of neck of femur 05/22/2016    Priority: Medium    dexa 04/2016: T = - 1.4 lowest at left femoral neck; nl at lumbar spine.    . Abnormal thallium stress test 03/07/2015    Priority: Medium    Small, partially reversible inferolateral ischemia. Marked hypertensive response to exercise. Low risk. Overview:  Overview:  Small, partially reversible inferolateral ischemia. Marked hypertensive response to exercise. Low risk.  Last Assessment & Plan:  No longer a major concern here as far as cardiac symptoms. Recommend taking aspirin, blood pressure control. If symptoms recur, would consider beta blocker however with some of her symptoms just simply being fatigued, I decided not to use beta blockers in the past. We'll check fasting lipid panel for risk factor evaluation.   . Chronic obstructive pulmonary disease ([place]) 07/01/2013    Priority: Medium    Overview:  Last Assessment & Plan:  Mild disease based on PFT. Considered d/c her scheduled BD's but she has clinically benefited from the Advair.  - contineu the advair - SABA prn - work on conditioning now that cleared by cardiology and pulm - rov 12 Overview:  Overview:  Last Assessment & Plan:  Mild disease based on PFT. Considered d/c her scheduled BD's but she has clinically benefited from the Advair.  - contineu the advair - SABA prn - work on conditioning now  that cleared by cardiology and pulm - rov 12    Health Maintenance  Topic Date Due  . PNA vac Low Risk Adult (2 of 2 - PCV13) 11/23/2016  . INFLUENZA VACCINE  09/10/2017  . TETANUS/TDAP  08/11/2018  . DEXA SCAN  05/02/2019  . COLONOSCOPY  07/24/2024  . Hepatitis C Screening  Completed  . MAMMOGRAM  Discontinued   Immunization History  Administered Date(s) Administered  . Influenza Split 04/24/2013  . Influenza, High Dose Seasonal PF 09/14/2016  . Influenza, Seasonal, Injecte, Preservative Fre 11/24/2015  . Influenza,inj,Quad PF,6+ Mos 11/24/2015  . Influenza,inj,quad, With Preservative 03/21/2013  . PPD Test 04/02/2015  . Pneumococcal Polysaccharide-23 07/03/2014, 11/24/2015  . Pneumococcal-Unspecified 07/03/2014, 11/24/2015  . Td 08/10/2008   Current Meds  Medication Sig  . albuterol (PROAIR HFA) 108 (90 Base) MCG/ACT inhaler Inhale into the lungs every 6 (six) hours as needed for wheezing or shortness of breath.   [name] amLODipine (NORVASC) 10 MG tablet Take 1 tablet (10 mg total) by mouth daily.  . hydrALAZINE (APRESOLINE) 50 MG tablet Take 1 tablet (50 mg total) by mouth every 8 (eight) hours.  . Ledipasvir-Sofosbuvir (HARVONI) 90-400 MG TABS Take 1 tablet  by mouth.  [name] lisinopril-hydrochlorothiazide (PRINZIDE,ZESTORETIC) 20-12.5 MG tablet Take 2 tablets by mouth daily.  . [DISCONTINUED] amLODipine (NORVASC) 10 MG tablet Take 1 tablet (10 mg total) by mouth daily.  . [DISCONTINUED] hydrALAZINE (APRESOLINE) 50 MG tablet Take 1 tablet (50 mg total) by mouth every 8 (eight) hours.  . [DISCONTINUED] lisinopril-hydrochlorothiazide (PRINZIDE,ZESTORETIC) 20-12.5 MG tablet Take by mouth.    Allergies: Patient has No Known Allergies. Past Medical History Patient  has a past medical history of Abnormal nuclear stress test (February '13), Arthritis, Chest pain with minimal risk for cardiac etiology (February 2013), COPD (chronic obstructive pulmonary disease) ([place]), Depression, Hepatitis  C, Hypertension, Osteopenia of spine (05/22/2016), and Right tibial fracture ( ). Past Surgical History Patient  has a past surgical history that includes Tibia fracture surgery (Right, 09/2008); doppler echocardiography (03/26/2011); and NM MYOVIEW LTD (03/27/11). Family History: Patient family history includes Asthma in her mother; Cancer in her mother; Cancer - Prostate in her father. Social History:  Patient  reports that she has been smoking cigarettes.  She has a 45.00 pack-year smoking history. She has never used smokeless tobacco. She reports that she drinks about 1.2 oz of alcohol per week. She reports that she has current or past drug history. Drug: Marijuana.  Review of Systems: Constitutional: negative for fever or malaise Ophthalmic: negative for photophobia, double vision or loss of vision Cardiovascular: negative for chest pain, dyspnea on exertion, or new LE swelling Respiratory: negative for SOB or persistent cough Gastrointestinal: negative for abdominal pain, change in bowel habits or melena Genitourinary: negative for dysuria or gross hematuria Musculoskeletal: negative for new gait disturbance or muscular weakness Integumentary: negative for new or persistent rashes Neurological: negative for TIA or stroke symptoms Psychiatric: negative for SI or delusions Allergic/Immunologic: negative for hives  Patient Care Team    Relationship Specialty Notifications Start End  Leamon Arnt, MD PCP - General Family Medicine  06/18/17     Objective  Vitals: BP 118/80   Pulse 65   Temp 98.8 F (37.1 C)   Ht 5\' 4"  (1.626 m)   Wt 150 lb (68 kg)   BMI 25.75 kg/m  General:  Well developed, well nourished, no acute distress  Psych:  Alert and oriented,normal mood and affect HEENT:  Normocephalic, atraumatic, non-icteric sclera, PERRL, oropharynx is without mass or exudate, supple neck without adenopathy, mass or thyromegaly Cardiovascular:  RRR without gallop, rub or murmur,  nondisplaced PMI Respiratory:  Good breath sounds bilaterally, CTAB with normal respiratory effort Gastrointestinal: normal bowel sounds, soft, non-tender, no noted masses. No HSM MSK: no deformities, contusions. Joints are without erythema or swelling Skin:  Warm, no rashes or suspicious lesions noted Neurologic:    Mental status is normal. Gross motor and sensory exams are normal. Normal gait Lab Results  Component Value Date   HGBA1C 5.7 06/18/2017   HGBA1C 5.6 11/23/2015    Abstract on 06/18/2017  Component Date Value Ref Range Status  . HM Dexa Scan 05/01/2016 Report in Chart   Final  Office Visit on 06/18/2017  Component Date Value Ref Range Status  . HM Colonoscopy 07/25/2014 See Report (in chart)  See Report (in chart), Patient Reported Final  . HM Mammogram 01/23/2016 0-4 Bi-Rad  0-4 Bi-Rad, Self Reported Normal Final  . Hemoglobin A1C 06/18/2017 5.7   Final    Assessment  1. Essential hypertension   2. High glucose level   3. Chronic hepatitis C without hepatic coma ([place])   4. Simple chronic bronchitis (  [place])   5. Cigarette nicotine dependence with nicotine-induced disorder   6. Osteopenia of neck of left femur      Plan   Hypertension follow up: This medical condition is well controlled. There are no signs of complications, medication side effects, or red flags. Patient is instructed to continue the current treatment plan without change in therapies or medications. Check bmp and lipids  H/o elevated glucose: ? Stress reaction while sick. a1c normal. Check bmp.   Smoker with copd: counseling done. Continue inhalers. Prevnar RX given today.  F/u with GI for hep C tx.   Osteopenia by dxa: start vit d and calcium daily   HM: due for mammogram.   Follow up:  Return in about 6 months (around 12/19/2017) for complete physical, AWV at patient's convenience.  Commons side effects, risks, benefits, and alternatives for medications and treatment plan prescribed today were  discussed, and the patient expressed understanding of the given instructions. Patient is instructed to call or message via MyChart if he/she has any questions or concerns regarding our treatment plan. No barriers to understanding were identified. We discussed Red Flag symptoms and signs in detail. Patient expressed understanding regarding what to do in case of urgent or emergency type symptoms.   Medication list was reconciled, printed and provided to the patient in AVS. Patient instructions and summary information was reviewed with the patient as documented in the AVS. This note was prepared with assistance of Dragon voice recognition software. Occasional wrong-word or sound-a-like substitutions may have occurred due to the inherent limitations of voice recognition software  Orders Placed This Encounter  Procedures  . HM MAMMOGRAPHY  . Basic metabolic panel  . Lipid panel  . POCT glycosylated hemoglobin (Hb A1C)  . HM COLONOSCOPY   Meds ordered this encounter  Medications  . pneumococcal 13-valent conjugate vaccine (PREVNAR 13) SUSP injection    Sig: Inject 0.5 mLs into the muscle once for 1 dose.    Dispense:  0.5 mL    Refill:  0  . amLODipine (NORVASC) 10 MG tablet    Sig: Take 1 tablet (10 mg total) by mouth daily.    Dispense:  30 tablet    Refill:  2  . hydrALAZINE (APRESOLINE) 50 MG tablet    Sig: Take 1 tablet (50 mg total) by mouth every 8 (eight) hours.    Dispense:  90 tablet    Refill:  2  . lisinopril-hydrochlorothiazide (PRINZIDE,ZESTORETIC) 20-12.5 MG tablet    Sig: Take 2 tablets by mouth daily.    Dispense:  90 tablet    Refill:  1

## 2017-06-19 MED ORDER — ATORVASTATIN CALCIUM 10 MG PO TABS: 10.0000 mg | ORAL_TABLET | Freq: Every day | ORAL | 3 refills | Status: DC

## 2017-06-19 NOTE — Addendum Note (Signed)
Addended by: Billey Chang on: 06/19/2017 08:30 AM   Modules accepted: Orders

## 2017-06-19 NOTE — Progress Notes (Signed)
Please call patient: I have reviewed his/her lab results. I recommend treating her high cholesterol levels with a cholesterol lowering medication called a statin. High cholesterol increases risk of heart and vascular disease. I've ordered a medication to take at night. We can recheck this in 3 months. Please schedule an office visit. Other labs all look fine at this time.  The 10-year ASCVD risk score Mikey Bussing DC Brooke Bonito., et al., 2013) is: 18.9%   Values used to calculate the score:     Age: 67 years     Sex: Female     Is Non-Hispanic African American: Yes     Diabetic: No     Tobacco smoker: Yes     Systolic Blood Pressure: 188 mmHg     Is BP treated: Yes     HDL Cholesterol: 84.4 mg/dL     Total Cholesterol: 247 mg/dL

## 2017-07-10 ENCOUNTER — Ambulatory Visit: Payer: Self-pay | Admitting: *Deleted

## 2017-07-10 NOTE — Telephone Encounter (Signed)
Patient call, left VM to return call to the office to discuss the symptoms of fainting.

## 2017-07-13 ENCOUNTER — Ambulatory Visit: Payer: Medicare HMO | Admitting: Family Medicine

## 2017-07-13 NOTE — Telephone Encounter (Signed)
FYI- Patient has appt with Dr. Jonni Sanger at 3:00pm

## 2017-07-13 NOTE — Telephone Encounter (Signed)
For 2 months- patient reports she has discolorations on her arm- both arm- elbow to shoulder- darker pigment of skin- half as large as penny. No itching no raised area- just discoloration. Only present on the back side. Patient reports she had a dizzy spell- she recovered quickly after resting and water- but she is concerned and wants to get checked out.  Reason for Disposition . [1] NO dizziness now AND [2] age > 70  Answer Assessment - Initial Assessment Questions 1. DESCRIPTION: "Describe your dizziness."     Room was spinning- patient couldn't walk straight- had to lay down 2. VERTIGO: "Do you feel like either you or the room is spinning or tilting?"      Room spinning 3. LIGHTHEADED: "Do you feel lightheaded?" (e.g., somewhat faint, woozy, weak upon standing)     Felt like she was going to faint- but felt like she was going to fall 4. SEVERITY: "How bad is it?"  "Can you walk?"   - MILD - Feels unsteady but walking normally.   - MODERATE - Feels very unsteady when walking, but not falling; interferes with normal activities (e.g., school, work) .   - SEVERE - Unable to walk without falling (requires assistance).     Patient had to lay down- lasted 7 minutes- within 5 minuted she was back to normal 5. ONSET:  "When did the dizziness begin?"     4 days ago- never happened before 6. AGGRAVATING FACTORS: "Does anything make it worse?" (e.g., standing, change in head position)     Standing- patient was trying to walk 7. CAUSE: "What do you think is causing the dizziness?"     Patient had eaten pork the day before, new Rx for Ca 8. RECURRENT SYMPTOM: "Have you had dizziness before?" If so, ask: "When was the last time?" "What happened that time?"     Never happened before 9. OTHER SYMPTOMS: "Do you have any other symptoms?" (e.g., headache, weakness, numbness, vomiting, earache)     No other symptoms- this was a one time- out of the blue occurance 10. PREGNANCY: "Is there any chance you are  pregnant?" "When was your last menstrual period?"       n/a  Protocols used: DIZZINESS - VERTIGO-A-AH

## 2017-09-22 ENCOUNTER — Ambulatory Visit: Payer: Medicare HMO | Admitting: Family Medicine

## 2017-09-22 ENCOUNTER — Ambulatory Visit (INDEPENDENT_AMBULATORY_CARE_PROVIDER_SITE_OTHER): Payer: Medicare HMO | Admitting: Family Medicine

## 2017-09-22 ENCOUNTER — Other Ambulatory Visit: Payer: Self-pay

## 2017-09-22 ENCOUNTER — Encounter: Payer: Self-pay | Admitting: Family Medicine

## 2017-09-22 VITALS — BP 118/74 | HR 92 | Ht 64.0 in | Wt 148.0 lb

## 2017-09-22 DIAGNOSIS — I10 Essential (primary) hypertension: Secondary | ICD-10-CM

## 2017-09-22 DIAGNOSIS — E782 Mixed hyperlipidemia: Secondary | ICD-10-CM

## 2017-09-22 DIAGNOSIS — F17219 Nicotine dependence, cigarettes, with unspecified nicotine-induced disorders: Secondary | ICD-10-CM | POA: Diagnosis not present

## 2017-09-22 DIAGNOSIS — R21 Rash and other nonspecific skin eruption: Secondary | ICD-10-CM

## 2017-09-22 LAB — COMPREHENSIVE METABOLIC PANEL
ALK PHOS: 62 U/L (ref 39–117)
ALT: 11 U/L (ref 0–35)
AST: 15 U/L (ref 0–37)
Albumin: 4.6 g/dL (ref 3.5–5.2)
BILIRUBIN TOTAL: 0.6 mg/dL (ref 0.2–1.2)
BUN: 18 mg/dL (ref 6–23)
CALCIUM: 10.3 mg/dL (ref 8.4–10.5)
CO2: 28 mEq/L (ref 19–32)
Chloride: 103 mEq/L (ref 96–112)
Creatinine, Ser: 0.97 mg/dL (ref 0.40–1.20)
GFR: 73.56 mL/min (ref >60.00)
Glucose, Bld: 104 mg/dL — ABNORMAL HIGH (ref 70–99)
Potassium: 3.9 mEq/L (ref 3.5–5.1)
Sodium: 139 mEq/L (ref 135–145)
TOTAL PROTEIN: 8.4 g/dL — AB (ref 6.0–8.3)

## 2017-09-22 LAB — LIPID PANEL
CHOLESTEROL: 176 mg/dL (ref 0–200)
HDL: 77.4 mg/dL (ref >39.00)
LDL Cholesterol: 80 mg/dL (ref 0–99)
NonHDL: 98.49
TRIGLYCERIDES: 91 mg/dL (ref 0.0–149.0)
Total CHOL/HDL Ratio: 2
VLDL: 18.2 mg/dL (ref 0.0–40.0)

## 2017-09-22 NOTE — Patient Instructions (Signed)
Please return in 3 months for your physical.  We will call you with information regarding your referral appointment. Dermatology.  If you do not hear from [place] within the next 2 weeks, please let me know. It can take 1-2 weeks to get appointments set up with the specialists.    Medicare recommends an Annual Wellness Visit for all patients. Please schedule this to be done with our Nurse Educator, Maudie Mercury. This is an informative "talk" visit; it's goals are to ensure that your health care needs are being met and to give you education regarding avoiding falls, ensuring you are not suffering from depression or problems with memory or thinking, and to educate you on Advance Care Planning. It helps me take good care of you!  If you have any questions or concerns, please don't hesitate to send me a message via MyChart or call the office at [phone]. Thank you for visiting with [place] today! It's our pleasure caring for you.

## 2017-09-22 NOTE — Progress Notes (Signed)
Please call patient: I have reviewed his/her lab results. Cholesterol levels have decreased by 40% and now look great.!! Continue the medications as discussed.

## 2017-09-22 NOTE — Progress Notes (Signed)
Subjective  CC:  Chief Complaint  Patient presents with  . Hyperlipidemia    doing well, patient is not fasting today   . Hypertension    HPI: Christine Hurley is a 67 y.o. female who presents to the office today to address the problems listed above in the chief complaint.  Hypertension f/u: Control is good . Pt reports she is doing well. taking medications as instructed, no medication side effects noted, no TIAs, no chest pain on exertion, no dyspnea on exertion, no swelling of ankles. She denies adverse effects from his BP medications. Compliance with medication is good.   Hyperlipidemia f/u: Patient presents for follow up of lipids. Doing well on atorvastatin started 3 months ago.  on meds w/o myalgias or side effects. Compliance with treatment thus far has been excellent. The patient does not use medications that may worsen dyslipidemias (corticosteroids, progestins, anabolic steroids, diuretics, beta-blockers, amiodarone, cyclosporine, olanzapine). The patient exercises intermittently. The patient is not known to have coexisting coronary artery disease  Has rash on bilateral upper arms x 3-4 months. No sxs. No new drugs prior to rash. Nonspreading. No associated sxs.   Copd - breathing is fine.    Assessment  1. Mixed hyperlipidemia   2. Essential hypertension   3. Cigarette nicotine dependence with nicotine-induced disorder   4. Rash and nonspecific skin eruption      Plan    Hypertension f/u: BP control is well controlled. Continue meds  Hyperlipidemia f/u: recheck nonfasting lipids today on statin. Check lfts.   Smoking cessation: continue to decrease cigs; discussed how this will lower her ASCVD risk score as well.   Copd is stable.   Rash: nonspecific. To derm for further assessment.  Education regarding management of these chronic disease states was given. Management strategies discussed on successive visits include dietary and exercise recommendations, goals of  achieving and maintaining IBW, and lifestyle modifications aiming for adequate sleep and minimizing stressors.   Follow up: Return in about 3 months (around 12/23/2017) for complete physical, AWV at patient's convenience.  Orders Placed This Encounter  Procedures  . Comprehensive metabolic panel  . Lipid panel  . Ambulatory referral to Dermatology   No orders of the defined types were placed in this encounter.     BP Readings from Last 3 Encounters:  09/22/17 118/74  06/18/17 118/80  04/23/17 (!) 198/98   Wt Readings from Last 3 Encounters:  09/22/17 148 lb (67.1 kg)  06/18/17 150 lb (68 kg)  11/25/15 153 lb 3.5 oz (69.5 kg)    Lab Results  Component Value Date   CHOL 247 (H) 06/18/2017   CHOL 143 11/08/2008   Lab Results  Component Value Date   HDL 84.40 06/18/2017   HDL 53 11/08/2008   Lab Results  Component Value Date   LDLCALC 140 (H) 06/18/2017   LDLCALC 57 11/08/2008   Lab Results  Component Value Date   TRIG 117.0 06/18/2017   TRIG 166 (H) 11/08/2008   Lab Results  Component Value Date   CHOLHDL 3 06/18/2017   CHOLHDL 2.7 Ratio 11/08/2008   No results found for: LDLDIRECT Lab Results  Component Value Date   CREATININE 0.75 06/18/2017   BUN 15 06/18/2017   NA 136 06/18/2017   K 4.1 06/18/2017   CL 101 06/18/2017   CO2 28 06/18/2017    The 10-year ASCVD risk score Mikey Bussing DC Jr., et al., 2013) is: 18.9%   Values used to calculate the score:  Age: 30 years     Sex: Female     Is Non-Hispanic African American: Yes     Diabetic: No     Tobacco smoker: Yes     Systolic Blood Pressure: 010 mmHg     Is BP treated: Yes     HDL Cholesterol: 84.4 mg/dL     Total Cholesterol: 247 mg/dL  I reviewed the patients updated PMH, FH, and SocHx.    Patient Active Problem List   Diagnosis Date Noted  . Cigarette nicotine dependence with nicotine-induced disorder 11/03/2016    Priority: High  . Essential hypertension 05/26/2013    Priority: High  .  Chronic hepatitis C without hepatic coma ([place]) 11/04/2006    Priority: High  . Marijuana use, episodic 06/18/2017    Priority: Medium  . Osteopenia of neck of femur 05/22/2016    Priority: Medium  . Abnormal thallium stress test 03/07/2015    Priority: Medium  . Chronic obstructive pulmonary disease ([place]) 07/01/2013    Priority: Medium    Allergies: Patient has no known allergies.  Social History: Patient  reports that she has been smoking cigarettes. She has a 45.00 pack-year smoking history. She has never used smokeless tobacco. She reports that she drinks about 2.0 standard drinks of alcohol per week. She reports that she has current or past drug history. Drug: Marijuana.  Current Meds  Medication Sig  . albuterol (PROAIR HFA) 108 (90 Base) MCG/ACT inhaler Inhale into the lungs every 6 (six) hours as needed for wheezing or shortness of breath.   [name] amLODipine (NORVASC) 10 MG tablet Take 1 tablet (10 mg total) by mouth daily.  [name] atorvastatin (LIPITOR) 10 MG tablet Take 1 tablet (10 mg total) by mouth daily.  . hydrALAZINE (APRESOLINE) 50 MG tablet Take 1 tablet (50 mg total) by mouth every 8 (eight) hours.  [name] lisinopril-hydrochlorothiazide (PRINZIDE,ZESTORETIC) 20-12.5 MG tablet Take 2 tablets by mouth daily.    Review of Systems: Cardiovascular: negative for chest pain, palpitations, leg swelling, orthopnea Respiratory: negative for SOB, wheezing or persistent cough Gastrointestinal: negative for abdominal pain Genitourinary: negative for dysuria or gross hematuria  Objective  Vitals: BP 118/74   Pulse 92   Ht 5\' 4"  (1.626 m)   Wt 148 lb (67.1 kg)   SpO2 98%   BMI 25.40 kg/m  General: no acute distress  Psych:  Alert and oriented, normal mood and affect HEENT:  Normocephalic, atraumatic, supple neck  Cardiovascular:  RRR without murmur. no edema Respiratory:  Good breath sounds bilaterally, CTAB with normal respiratory effort Skin:  Warm,bilateral upper ext with ovoid  macular rash w/o target lesions. No urticaria. no flaking.  Neurologic:   Mental status is normal  Commons side effects, risks, benefits, and alternatives for medications and treatment plan prescribed today were discussed, and the patient expressed understanding of the given instructions. Patient is instructed to call or message via MyChart if he/she has any questions or concerns regarding our treatment plan. No barriers to understanding were identified. We discussed Red Flag symptoms and signs in detail. Patient expressed understanding regarding what to do in case of urgent or emergency type symptoms.   Medication list was reconciled, printed and provided to the patient in AVS. Patient instructions and summary information was reviewed with the patient as documented in the AVS. This note was prepared with assistance of Dragon voice recognition software. Occasional wrong-word or sound-a-like substitutions may have occurred due to the inherent limitations of voice recognition software

## 2017-10-06 ENCOUNTER — Telehealth: Payer: Self-pay | Admitting: Family Medicine

## 2017-10-06 ENCOUNTER — Other Ambulatory Visit: Payer: Self-pay | Admitting: Family Medicine

## 2017-10-06 MED ORDER — ATORVASTATIN CALCIUM 10 MG PO TABS: 10.0000 mg | ORAL_TABLET | Freq: Every day | ORAL | 3 refills | Status: DC

## 2017-10-06 NOTE — Telephone Encounter (Signed)
Copied from Time [id number]. Topic: Quick Communication - Rx Refill/Question >> Oct 06, 2017  3:45 PM Keene Breath wrote: Medication: atorvastatin (LIPITOR) 10 MG tablet  Patient called to request a refill for the above medication.  CB# [phone]  Preferred Pharmacy (with phone number or street name): [facility] [place], [place] [phone] (Phone) [phone] (Fax)

## 2017-10-19 ENCOUNTER — Telehealth: Payer: Self-pay | Admitting: Emergency Medicine

## 2017-10-19 NOTE — Telephone Encounter (Signed)
Copied from [place] [id number]. Topic: Inquiry >> Oct 19, 2017  1:52 PM Conception Chancy, NT wrote: Reason for CRM: patient is calling and states she needs a letter stating that she has been under a doctors care for 4-5 years. Patient would like to be reached once it is ready for pick up.

## 2017-10-20 ENCOUNTER — Encounter: Payer: Self-pay | Admitting: Emergency Medicine

## 2017-10-20 NOTE — Telephone Encounter (Signed)
Please call her to clarify what her letter is for and what it needs to say.

## 2017-10-20 NOTE — Telephone Encounter (Signed)
Please advise. [place] for letter?    Doloris Hall,  LPN

## 2017-10-20 NOTE — Telephone Encounter (Signed)
Patient informed that Letter is ready for her to pick up at front desk   Doloris Hall,  LPN

## 2017-10-20 NOTE — Telephone Encounter (Signed)
Pt needs the letter going back at least 3 years that she was on medications . Pt needs the letter to take to court . Pt got a ticket

## 2017-10-20 NOTE — Telephone Encounter (Signed)
May document that she has been under my care since our first visit date.  And that I have reviewed her chart verifying that she was under other provider's care for the last 4 years.

## 2017-11-10 ENCOUNTER — Other Ambulatory Visit: Payer: Self-pay | Admitting: Family Medicine

## 2017-12-15 ENCOUNTER — Encounter: Payer: Medicare HMO | Admitting: Family Medicine

## 2017-12-15 ENCOUNTER — Other Ambulatory Visit: Payer: Self-pay | Admitting: Family Medicine

## 2017-12-18 ENCOUNTER — Ambulatory Visit (INDEPENDENT_AMBULATORY_CARE_PROVIDER_SITE_OTHER): Payer: Medicare Other | Admitting: Family Medicine

## 2017-12-18 ENCOUNTER — Encounter: Payer: Self-pay | Admitting: Family Medicine

## 2017-12-18 ENCOUNTER — Other Ambulatory Visit: Payer: Self-pay

## 2017-12-18 VITALS — BP 132/86 | HR 83 | Temp 98.1°F | Ht 64.0 in | Wt 152.0 lb

## 2017-12-18 DIAGNOSIS — Z1239 Encounter for other screening for malignant neoplasm of breast: Secondary | ICD-10-CM

## 2017-12-18 DIAGNOSIS — Z0000 Encounter for general adult medical examination without abnormal findings: Secondary | ICD-10-CM

## 2017-12-18 DIAGNOSIS — B182 Chronic viral hepatitis C: Secondary | ICD-10-CM

## 2017-12-18 DIAGNOSIS — I10 Essential (primary) hypertension: Secondary | ICD-10-CM | POA: Diagnosis not present

## 2017-12-18 DIAGNOSIS — Z23 Encounter for immunization: Secondary | ICD-10-CM | POA: Diagnosis not present

## 2017-12-18 DIAGNOSIS — R42 Dizziness and giddiness: Secondary | ICD-10-CM

## 2017-12-18 DIAGNOSIS — R55 Syncope and collapse: Secondary | ICD-10-CM | POA: Diagnosis not present

## 2017-12-18 DIAGNOSIS — J410 Simple chronic bronchitis: Secondary | ICD-10-CM | POA: Diagnosis not present

## 2017-12-18 MED ORDER — ATORVASTATIN CALCIUM 10 MG PO TABS: 10.0000 mg | ORAL_TABLET | Freq: Every day | ORAL | 3 refills | Status: DC

## 2017-12-18 MED ORDER — ZOSTER VAC RECOMB ADJUVANTED 50 MCG/0.5ML IM SUSR: 0.5000 mL | Freq: Once | INTRAMUSCULAR | 0 refills | Status: AC

## 2017-12-18 MED ORDER — LISINOPRIL-HYDROCHLOROTHIAZIDE 20-12.5 MG PO TABS: 2.0000 | ORAL_TABLET | Freq: Every day | ORAL | 3 refills | Status: DC

## 2017-12-18 MED ORDER — AMLODIPINE BESYLATE 10 MG PO TABS: 10.0000 mg | ORAL_TABLET | Freq: Every day | ORAL | 3 refills | Status: DC

## 2017-12-18 NOTE — Patient Instructions (Addendum)
Please return in 4 weeks for follow up of your hypertension. Please schedule AWV as well.   Stop taking the hydralzine. Start checking your blood pressures daily and bring in the written log of your numbers.   Medicare recommends an Annual Wellness Visit for all patients. Please schedule this to be done with our Nurse Educator, Maudie Mercury. This is an informative "talk" visit; it's goals are to ensure that your health care needs are being met and to give you education regarding avoiding falls, ensuring you are not suffering from depression or problems with memory or thinking, and to educate you on Advance Care Planning. It helps me take good care of you!  We will call you with information regarding your referral appointment. Mammogram. If you do not hear from [place] within the next 2 weeks, please let me know. It can take 1-2 weeks to get appointments set up with the specialists.   If you have any questions or concerns, please don't hesitate to send me a message via MyChart or call the office at [phone]. Thank you for visiting with [place] today! It's our pleasure caring for you.  Recommendations for women to keep healthy:   EXERCISE AND DIET: We recommended that you start or continue a regular exercise program for good health. Regular exercise means any activity that makes your heart beat faster and makes you sweat. We recommend exercising at least 30 minutes per day at least 3 days a week, preferably 4 or 5. We also recommend a diet low in fat and sugar. Inactivity, poor dietary choices and obesity can cause diabetes, heart attack, stroke, and kidney damage, among others.   ALCOHOL AND SMOKING: Women should limit their alcohol intake to no more than 7 drinks/beers/glasses of wine (combined, not each!) per week. Moderation of alcohol intake to this level decreases your risk of breast cancer and liver damage. And of course, no recreational drugs are part of a healthy lifestyle. And absolutely no smoking or even  second hand smoke. Most people know smoking can cause heart and lung diseases, but did you know it also contributes to weakening of your bones? Aging of your skin? Yellowing of your teeth and nails?  CALCIUM AND VITAMIN D: Adequate intake of calcium and Vitamin D are recommended. The recommendations for exact amounts of these supplements seem to change often, but generally speaking 600 mg of calcium (either carbonate or citrate) and 800 units of Vitamin D per day seems prudent. Certain women may benefit from higher intake of Vitamin D. If you are among these women, your doctor will have told you during your visit.   PAP SMEARS: Pap smears, to check for cervical cancer or precancers, have traditionally been done yearly, although recent scientific advances have shown that most women can have pap smears less often. However, every woman still should have a physical exam from her gynecologist every year. It will include a breast check, inspection of the vulva and vagina to check for abnormal growths or skin changes, a visual exam of the cervix, and then an exam to evaluate the size and shape of the uterus and ovaries. And after 67 years of age, a rectal exam is indicated to check for rectal cancers. We will also provide age appropriate advice regarding health maintenance, like when you should have certain vaccines, screening for sexually transmitted diseases, bone density testing, colonoscopy, mammograms, etc.   MAMMOGRAMS: All women over 6 years old should have a yearly mammogram. Many facilities now offer a "3D" mammogram,  which may cost around $50 extra out of pocket. If possible, we recommend you accept the option to have the 3D mammogram performed. It both reduces the number of women who will be called back for extra views which then turn out to be normal, and it is better than the routine mammogram at detecting truly abnormal areas.   COLONOSCOPY: Colonoscopy to screen for colon cancer is recommended for  all women at age 109. We know, you hate the idea of the prep. We agree, BUT, having colon cancer and not knowing it is worse!! Colon cancer so often starts as a polyp that can be seen and removed at colonscopy, which can quite literally save your life! And if your first colonoscopy is normal and you have no family history of colon cancer, most women don't have to have it again for 10 years. Once every ten years, you can do something that may end up saving your life, right? We will be happy to help you get it scheduled when you are ready. Be sure to check your insurance coverage so you understand how much it will cost. It may be covered as a preventative service at no cost, but you should check your particular policy.

## 2017-12-18 NOTE — Progress Notes (Signed)
Subjective  Chief Complaint  Patient presents with  . Annual Exam    doing well,requests flu shot today   . Dizziness    Occasional dizziness, feels faint x 2 months    HPI: Christine Hurley is a 67 y.o. female who presents to [place] at [facility] today for a Female Wellness Visit. She also has the concerns and/or needs as listed above in the chief complaint. These will be addressed in addition to the Health Maintenance Visit.   Wellness Visit: annual visit with health maintenance review and exam without Pap   HM: due mammogram, flu and shingrix  tob cessation: down to 3 cigs/day. Trying to quit altogether. Reviewed pfts from 2015: mild obstructive pattern w/o bronchodilator response. Rare albuterol use. Denies sob or daily cough. Had been on advair but stopped due to leaving a bad taste in her mouth.   Chronic disease f/u and/or acute problem visit: (deemed necessary to be done in addition to the wellness visit):  Reports 3 distinct episodes of feeling lightheaded. Last Sunday, while standing at church, had acute onset of blurry vision, hard breathing and feeling like she would passed out. sxs lasted for several minutes and dissipated spontaneously. Denies palpitations, sweats, n/v, or chest pain. Had 2 similar episodes while at home while watching tv (Nonexertional). She had attributed it to "eating the wrong thing". Denies loss of vision or diplopia, dysarthria or paresis. Felt fine afterwards.   HTN: on several meds but now taking hydralazine once daily for unclear reasons. Had been on it tid in the past.   HLD: stable no on statin w/o myalgias.   H/o chronic hep c with nl lfts now. Has seen hepatologist.   Assessment  1. Annual physical exam   2. Lightheadedness   3. Breast cancer screening   4. Simple chronic bronchitis ([place])   5. Chronic hepatitis C without hepatic coma ([place])   6. Essential hypertension   7. Encounter for immunization      Plan    Female Wellness Visit:  Age appropriate Health Maintenance and Prevention measures were discussed with patient. Included topics are cancer screening recommendations, ways to keep healthy (see AVS) including dietary and exercise recommendations, regular eye and dental care, use of seat belts, and avoidance of moderate alcohol use and tobacco use. Ordered mammogram.   BMI: discussed patient's BMI and encouraged positive lifestyle modifications to help get to or maintain a target BMI.  HM needs and immunizations were addressed and ordered. See below for orders. See HM and immunization section for updates. Flu shot today. Gave RX for shingrix.   Routine labs and screening tests ordered including cmp, cbc and lipids where appropriate.  Discussed recommendations regarding Vit D and calcium supplementation (see AVS)  Counseling on tobacco cessation. She is almost there  Chronic disease management visit and/or acute problem visit:  ? Orthostatic hypotension as cause of "episodes". Stop hydralazine and monitor home readings. Recheck 4 weeks. Check labs including thyroid and cbc.   HTN: as above, in the office, running low on repeated readings although first reading was elevated. Recheck 4 weeks.   Mild copd: no inhalers at this time. rec quitting smoking and then repeating pfts in 6 months to reassess.   Hep c: stable.  Follow up: Return in about 4 weeks (around 01/15/2018) for follow up Hypertension.  Orders Placed This Encounter  Procedures  . MM DIGITAL SCREENING BILATERAL  . Flu vaccine HIGH DOSE PF  . CBC with Differential/Platelet  .  Comprehensive metabolic panel  . TSH   Meds ordered this encounter  Medications  . amLODipine (NORVASC) 10 MG tablet    Sig: Take 1 tablet (10 mg total) by mouth daily.    Dispense:  90 tablet    Refill:  3  . atorvastatin (LIPITOR) 10 MG tablet    Sig: Take 1 tablet (10 mg total) by mouth daily.    Dispense:  90 tablet    Refill:  3  .  lisinopril-hydrochlorothiazide (PRINZIDE,ZESTORETIC) 20-12.5 MG tablet    Sig: Take 2 tablets by mouth daily.    Dispense:  180 tablet    Refill:  3    **Patient requests 90 days supply**  . Zoster Vaccine Adjuvanted [facility] injection    Sig: Inject 0.5 mLs into the muscle once for 1 dose. Please give 2nd dose 2-6 months after first dose    Dispense:  2 each    Refill:  0      Lifestyle: Body mass index is 26.09 kg/m. Wt Readings from Last 3 Encounters:  12/18/17 152 lb (68.9 kg)  09/22/17 148 lb (67.1 kg)  06/18/17 150 lb (68 kg)   Diet: general Exercise: keeps active  Patient Active Problem List   Diagnosis Date Noted  . Cigarette nicotine dependence with nicotine-induced disorder 11/03/2016    Priority: High  . Essential hypertension 05/26/2013    Priority: High  . Chronic hepatitis C without hepatic coma ([place]) 11/04/2006    Priority: High  . Marijuana use, episodic 06/18/2017    Priority: Medium    Reports uses it for her leg pain   . Osteopenia of neck of femur 05/22/2016    Priority: Medium    dexa 04/2016: T = - 1.4 lowest at left femoral neck; nl at lumbar spine.    . Abnormal thallium stress test 03/07/2015    Priority: Medium    Small, partially reversible inferolateral ischemia. Marked hypertensive response to exercise. Low risk.     . Chronic obstructive pulmonary disease ([place]) 07/01/2013    Priority: Medium    PFTs 2015: normal flow volumes with mild obstructive disease by shape of curves and no response to bronchodilator.    . Annual physical exam 12/18/2017   Health Maintenance  Topic Date Due  . MAMMOGRAM  01/22/2017  . INFLUENZA VACCINE  09/10/2017  . TETANUS/TDAP  08/11/2018  . DEXA SCAN  05/02/2019  . COLONOSCOPY  07/24/2024  . Hepatitis C Screening  Completed  . PNA vac Low Risk Adult  Completed   Immunization History  Administered Date(s) Administered  . Influenza Split 04/24/2013  . Influenza, High Dose Seasonal PF 09/14/2016,  12/18/2017  . Influenza, Seasonal, Injecte, Preservative Fre 11/24/2015  . Influenza,inj,Quad PF,6+ Mos 11/24/2015  . Influenza,inj,quad, With Preservative 03/21/2013  . PPD Test 04/02/2015  . Pneumococcal Conjugate-13 06/24/2017  . Pneumococcal Polysaccharide-23 07/03/2014, 11/24/2015  . Pneumococcal-Unspecified 07/03/2014, 11/24/2015  . Td 08/10/2008   We updated and reviewed the patient's past history in detail and it is documented below. Allergies: Patient has No Known Allergies. Past Medical History Patient  has a past medical history of Abnormal nuclear stress test (February '13), Arthritis, Chest pain with minimal risk for cardiac etiology (February 2013), COPD (chronic obstructive pulmonary disease) ([place]), Depression, Hepatitis C, Hypertension, Osteopenia of spine (05/22/2016), and Right tibial fracture ( ). Past Surgical History Patient  has a past surgical history that includes Tibia fracture surgery (Right, 09/2008); doppler echocardiography (03/26/2011); and NM MYOVIEW LTD (03/27/11). Family History: Patient family  history includes Asthma in her mother; Cancer in her mother; Cancer - Prostate in her father. Social History:  Patient  reports that she has been smoking cigarettes. She has a 45.00 pack-year smoking history. She has never used smokeless tobacco. She reports that she drinks about 2.0 standard drinks of alcohol per week. She reports that she has current or past drug history. Drug: Marijuana.  Review of Systems: Constitutional: negative for fever or malaise Ophthalmic: negative for photophobia, double vision or loss of vision Cardiovascular: negative for chest pain, dyspnea on exertion, or new LE swelling Respiratory: negative for SOB or persistent cough Gastrointestinal: negative for abdominal pain, change in bowel habits or melena Genitourinary: negative for dysuria or gross hematuria, no abnormal uterine bleeding or disharge Musculoskeletal: negative for new gait  disturbance or muscular weakness Integumentary: negative for new or persistent rashes, no breast lumps Neurological: negative for TIA or stroke symptoms Psychiatric: negative for SI or delusions Allergic/Immunologic: negative for hives  Patient Care Team    Relationship Specialty Notifications Start End  Leamon Arnt, MD PCP - General Family Medicine  06/18/17     Objective  Vitals: BP 132/86   Pulse 83   Temp 98.1 F (36.7 C)   Ht 5\' 4"  (1.626 m)   Wt 152 lb (68.9 kg)   SpO2 98%   BMI 26.09 kg/m   General:  Well developed, well nourished, no acute distress  Psych:  Alert and orientedx3,normal mood and affect HEENT:  Normocephalic, atraumatic, non-icteric sclera, PERRL, oropharynx is clear without mass or exudate, supple neck without adenopathy, mass or thyromegaly Cardiovascular:  Normal S1, S2, RRR without gallop, rub or murmur, nondisplaced PMI Respiratory:  Good breath sounds bilaterally, CTAB with normal respiratory effort Gastrointestinal: normal bowel sounds, soft, non-tender, no noted masses. No HSM MSK: no deformities, contusions. Joints are without erythema or swelling. Spine and CVA region are nontender Skin:  Warm, no rashes or suspicious lesions noted Neurologic:    Mental status is normal. CN 2-11 are normal. Gross motor and sensory exams are normal. Normal gait. No tremor Breast Exam: No mass, skin retraction or nipple discharge is appreciated in either breast. No axillary adenopathy. Fibrocystic changes are not noted  Orthostatic bps: see there. avg bp 100s/60s.    Commons side effects, risks, benefits, and alternatives for medications and treatment plan prescribed today were discussed, and the patient expressed understanding of the given instructions. Patient is instructed to call or message via MyChart if he/she has any questions or concerns regarding our treatment plan. No barriers to understanding were identified. We discussed Red Flag symptoms and signs in  detail. Patient expressed understanding regarding what to do in case of urgent or emergency type symptoms.   Medication list was reconciled, printed and provided to the patient in AVS. Patient instructions and summary information was reviewed with the patient as documented in the AVS. This note was prepared with assistance of Dragon voice recognition software. Occasional wrong-word or sound-a-like substitutions may have occurred due to the inherent limitations of voice recognition software

## 2017-12-19 LAB — COMPREHENSIVE METABOLIC PANEL
AG Ratio: 1.3 (calc) (ref 1.0–2.5)
ALT: 11 U/L (ref 6–29)
AST: 17 U/L (ref 10–35)
Albumin: 4.4 g/dL (ref 3.6–5.1)
Alkaline phosphatase (APISO): 60 U/L (ref 33–130)
BUN: 15 mg/dL (ref 7–25)
CO2: 26 mmol/L (ref 20–32)
Calcium: 9.4 mg/dL (ref 8.6–10.4)
Chloride: 105 mmol/L (ref 98–110)
Creat: 0.73 mg/dL (ref 0.50–0.99)
Globulin: 3.3 g/dL (calc) (ref 1.9–3.7)
Glucose, Bld: 74 mg/dL (ref 65–99)
Potassium: 3.8 mmol/L (ref 3.5–5.3)
Sodium: 139 mmol/L (ref 135–146)
Total Bilirubin: 0.4 mg/dL (ref 0.2–1.2)
Total Protein: 7.7 g/dL (ref 6.1–8.1)

## 2017-12-19 LAB — CBC WITH DIFFERENTIAL/PLATELET
Basophils Absolute: 20 cells/uL (ref 0–200)
Basophils Relative: 0.4 %
EOS PCT: 4.5 %
Eosinophils Absolute: 221 cells/uL (ref 15–500)
HCT: 36.6 % (ref 35.0–45.0)
HEMOGLOBIN: 12.4 g/dL (ref 11.7–15.5)
Lymphs Abs: 1940 cells/uL (ref 850–3900)
MCH: 30.5 pg (ref 27.0–33.0)
MCHC: 33.9 g/dL (ref 32.0–36.0)
MCV: 90.1 fL (ref 80.0–100.0)
MONOS PCT: 6.6 %
MPV: 9.9 fL (ref 7.5–12.5)
Neutro Abs: 2396 cells/uL (ref 1500–7800)
Neutrophils Relative %: 48.9 %
Platelets: 256 10*3/uL (ref 140–400)
RBC: 4.06 10*6/uL (ref 3.80–5.10)
RDW: 12.3 % (ref 11.0–15.0)
Total Lymphocyte: 39.6 %
WBC mixed population: 323 cells/uL (ref 200–950)
WBC: 4.9 10*3/uL (ref 3.8–10.8)

## 2017-12-19 LAB — TSH: TSH: 0.89 mIU/L (ref 0.40–4.50)

## 2017-12-21 NOTE — Progress Notes (Signed)
Please call patient: I have reviewed his/her lab results. All blood test results are normal. F/u in 4 weeks to recheck as discussed and to recheck blood pressures. thanks

## 2018-01-02 IMAGING — US US ABDOMEN LIMITED
1 series · 14 of 25 positions shown · non-contrast
Comparison: 03/08/2012 ultrasound.

CLINICAL DATA: 65-year-old female with abdominal pain. Hepatitis-C.
Hypertension. Initial encounter.

EXAM:
US ABDOMEN LIMITED - RIGHT UPPER QUADRANT

[Series 1: us abdomen limited · 0.19mm/px · 14 of 45 slices shown]
[im 1/45]
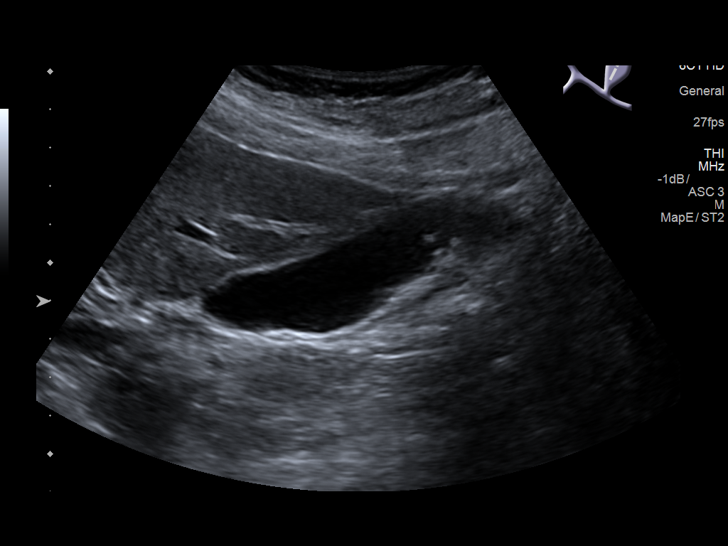
[im 4/45]
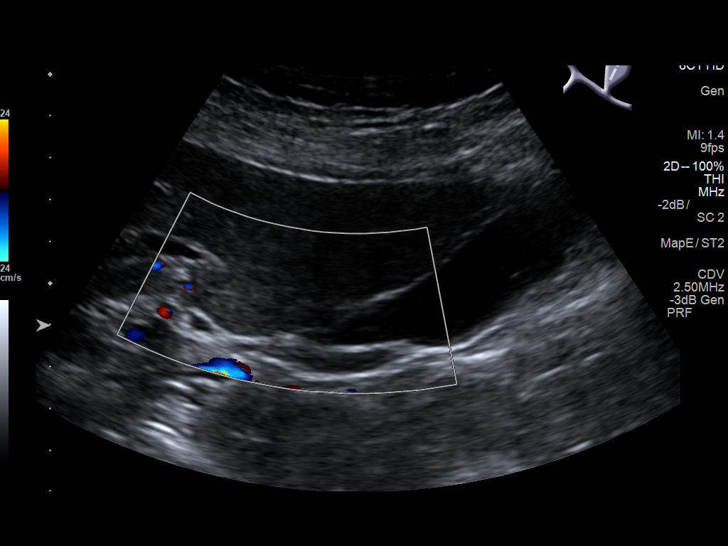
[im 8/45]
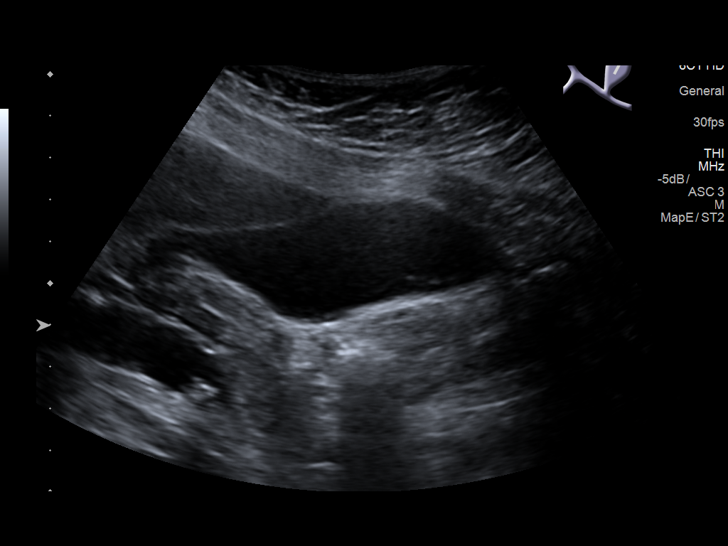
[im 12/45]
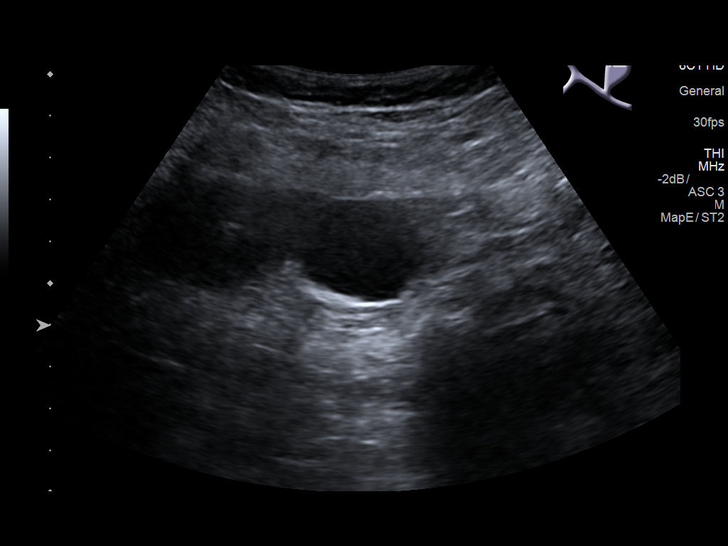
[im 15/45]
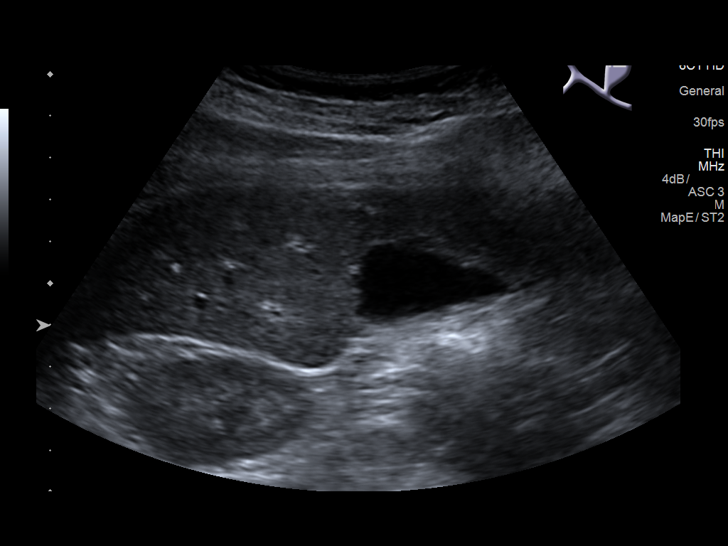
[im 17/45]
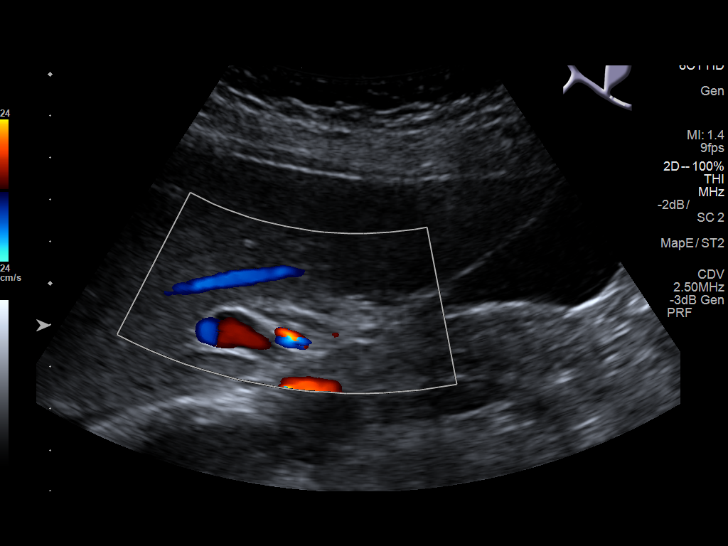
[im 21/45]
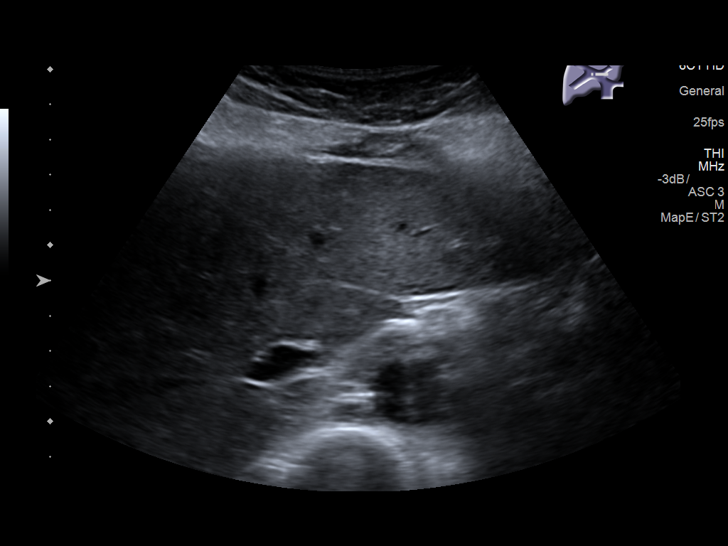
[im 24/45]
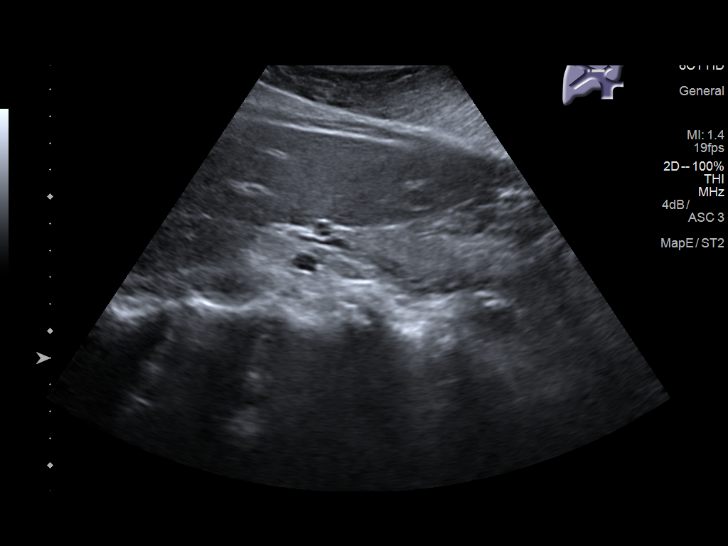
[im 28/45]
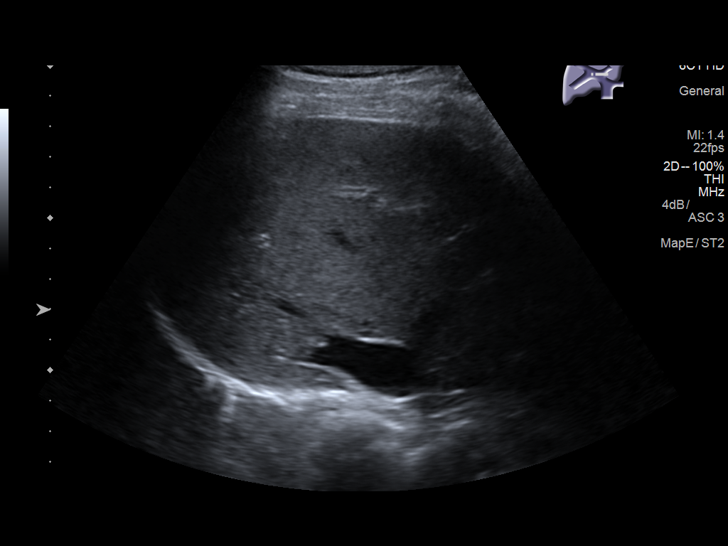
[im 30/45]
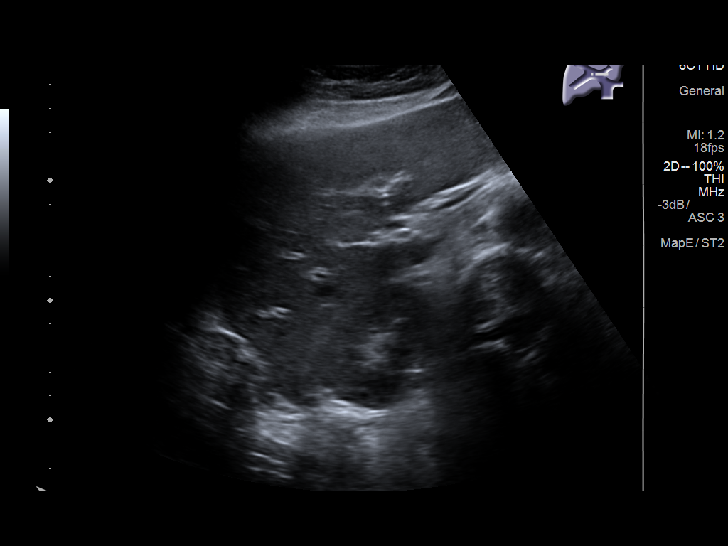
[im 34/45]
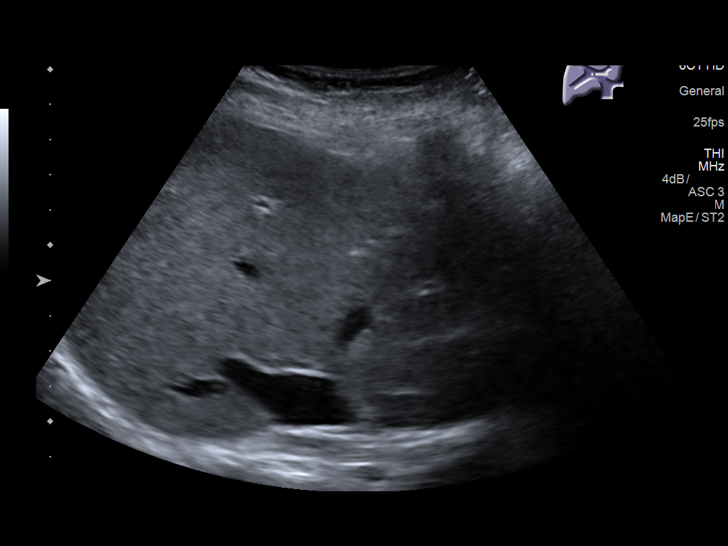
[im 37/45]
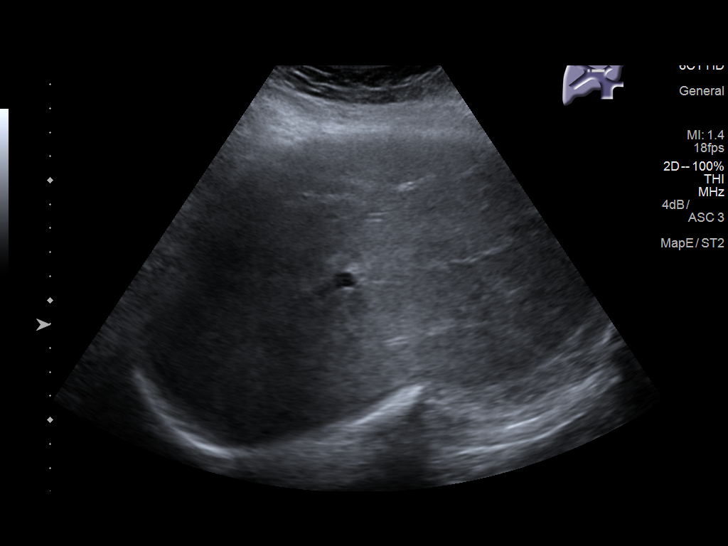
[im 41/45]
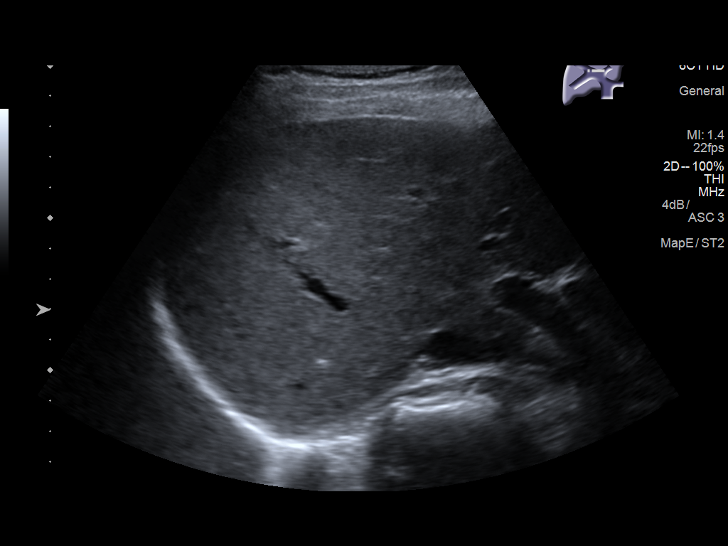
[im 45/45]
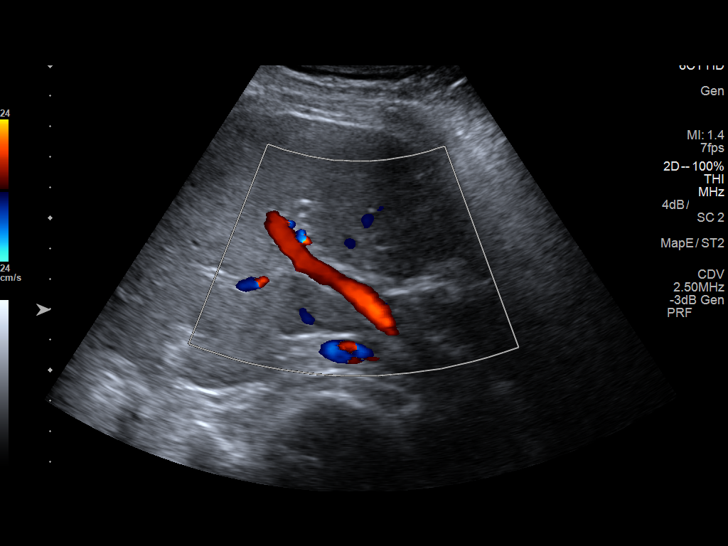

[14 of 25 positions shown; findings below may reference images not displayed]

FINDINGS: Gallbladder:

Tiny gallstones without gallbladder wall thickening or
pericholecystic fluid. No tenderness over gallbladder during
scanning per ultrasound technologist.

Common bile duct:

Diameter: 3.9 mm

Liver:

No focal lesion identified. Within normal limits in parenchymal
echogenicity.
IMPRESSION: Tiny gallstones without ultrasound evidence of cholecystitis.

## 2018-01-15 ENCOUNTER — Ambulatory Visit: Payer: Medicaid Other | Admitting: Family Medicine

## 2018-01-20 ENCOUNTER — Encounter: Payer: Self-pay | Admitting: Family Medicine

## 2018-01-20 ENCOUNTER — Ambulatory Visit (INDEPENDENT_AMBULATORY_CARE_PROVIDER_SITE_OTHER): Payer: Medicare Other | Admitting: Family Medicine

## 2018-01-20 ENCOUNTER — Other Ambulatory Visit: Payer: Self-pay

## 2018-01-20 VITALS — BP 118/80 | HR 88 | Temp 98.0°F | Resp 14 | Ht 64.0 in | Wt 152.0 lb

## 2018-01-20 DIAGNOSIS — F17219 Nicotine dependence, cigarettes, with unspecified nicotine-induced disorders: Secondary | ICD-10-CM

## 2018-01-20 DIAGNOSIS — R21 Rash and other nonspecific skin eruption: Secondary | ICD-10-CM | POA: Diagnosis not present

## 2018-01-20 DIAGNOSIS — I10 Essential (primary) hypertension: Secondary | ICD-10-CM

## 2018-01-20 DIAGNOSIS — B182 Chronic viral hepatitis C: Secondary | ICD-10-CM | POA: Diagnosis not present

## 2018-01-20 MED ORDER — TRIAMCINOLONE ACETONIDE 0.1 % EX CREA: 1.0000 "application " | TOPICAL_CREAM | Freq: Two times a day (BID) | CUTANEOUS | 0 refills | Status: DC

## 2018-01-20 NOTE — Progress Notes (Signed)
Subjective  CC:  Chief Complaint  Patient presents with  . Hypertension  . Rash    Middle of back, itching.     HPI: Christine Hurley is a 67 y.o. female who presents to the office today to address the problems listed above in the chief complaint. Hypertension f/u: see last note: here for 4 week f/u on "lightheadedness", low bp with positive orthostatics and med adjustment; we stopped hydralazine. Since she has done well. Home blood pressure log shows consistent perfect readings: 120s/70s. She feels well. No more near syncopal sxs.   BP Readings from Last 3 Encounters:  01/20/18 118/80  12/18/17 132/86  09/22/17 118/74   New rash: small itching patch on back. Started about 1 week ago. Not spreading. No associated sxs.  Smoking: remains with 3-4 cigs/day  Chronic hep c w/o complications s/p treatment. Saw gi last week. Awaiting lab work. Feeling well.   Assessment  1. Essential hypertension   2. Chronic hepatitis C without hepatic coma ([place])   3. Cigarette nicotine dependence with nicotine-induced disorder   4. Rash and nonspecific skin eruption      Plan    Hypertension f/u: BP control is well controlled. Continue meds. Do not use hydralazine. Pt aware.   Rash: eczematous appearing. Steroid cream x 1 2 weeks.   Tobacco cessation counseling given.   Hep c. Await records.  Education regarding management of these chronic disease states was given. Management strategies discussed on successive visits include dietary and exercise recommendations, goals of achieving and maintaining IBW, and lifestyle modifications aiming for adequate sleep and minimizing stressors.   Follow up: Return in about 6 months (around 07/22/2018) for follow up Hypertension.  No orders of the defined types were placed in this encounter.  Meds ordered this encounter  Medications  . triamcinolone cream (KENALOG) 0.1 %    Sig: Apply 1 application topically 2 (two) times daily. For 2 weeks, then as  needed    Dispense:  45 g    Refill:  0      BP Readings from Last 3 Encounters:  01/20/18 118/80  12/18/17 132/86  09/22/17 118/74   Wt Readings from Last 3 Encounters:  01/20/18 152 lb (68.9 kg)  12/18/17 152 lb (68.9 kg)  09/22/17 148 lb (67.1 kg)    Lab Results  Component Value Date   CHOL 176 09/22/2017   CHOL 247 (H) 06/18/2017   CHOL 143 11/08/2008   Lab Results  Component Value Date   HDL 77.40 09/22/2017   HDL 84.40 06/18/2017   HDL 53 11/08/2008   Lab Results  Component Value Date   LDLCALC 80 09/22/2017   LDLCALC 140 (H) 06/18/2017   LDLCALC 57 11/08/2008   Lab Results  Component Value Date   TRIG 91.0 09/22/2017   TRIG 117.0 06/18/2017   TRIG 166 (H) 11/08/2008   Lab Results  Component Value Date   CHOLHDL 2 09/22/2017   CHOLHDL 3 06/18/2017   CHOLHDL 2.7 Ratio 11/08/2008   No results found for: LDLDIRECT Lab Results  Component Value Date   CREATININE 0.73 12/18/2017   BUN 15 12/18/2017   NA 139 12/18/2017   K 3.8 12/18/2017   CL 105 12/18/2017   CO2 26 12/18/2017    The 10-year ASCVD risk score Mikey Bussing DC Jr., et al., 2013) is: 14.2%   Values used to calculate the score:     Age: 53 years     Sex: Female     Is Non-Hispanic  African American: Yes     Diabetic: No     Tobacco smoker: Yes     Systolic Blood Pressure: 638 mmHg     Is BP treated: Yes     HDL Cholesterol: 77.4 mg/dL     Total Cholesterol: 176 mg/dL  I reviewed the patients updated PMH, FH, and SocHx.    Patient Active Problem List   Diagnosis Date Noted  . Cigarette nicotine dependence with nicotine-induced disorder 11/03/2016    Priority: High  . Essential hypertension 05/26/2013    Priority: High  . Chronic hepatitis C without hepatic coma ([place]) 11/04/2006    Priority: High  . Marijuana use, episodic 06/18/2017    Priority: Medium  . Osteopenia of neck of femur 05/22/2016    Priority: Medium  . Abnormal thallium stress test 03/07/2015    Priority: Medium  .  Chronic obstructive pulmonary disease ([place]) 07/01/2013    Priority: Medium  . Annual physical exam 12/18/2017    Allergies: Patient has no known allergies.  Social History: Patient  reports that she has been smoking cigarettes. She has a 45.00 pack-year smoking history. She has never used smokeless tobacco. She reports that she drinks about 2.0 standard drinks of alcohol per week. She reports that she has current or past drug history. Drug: Marijuana.  Current Meds  Medication Sig  . albuterol (PROAIR HFA) 108 (90 Base) MCG/ACT inhaler Inhale into the lungs every 6 (six) hours as needed for wheezing or shortness of breath.   [name] amLODipine (NORVASC) 10 MG tablet Take 1 tablet (10 mg total) by mouth daily.  [name] atorvastatin (LIPITOR) 10 MG tablet Take 1 tablet (10 mg total) by mouth daily.  [name] lisinopril-hydrochlorothiazide (PRINZIDE,ZESTORETIC) 20-12.5 MG tablet Take 2 tablets by mouth daily.    Review of Systems: Cardiovascular: negative for chest pain, palpitations, leg swelling, orthopnea Respiratory: negative for SOB, wheezing or persistent cough Gastrointestinal: negative for abdominal pain Genitourinary: negative for dysuria or gross hematuria  Objective  Vitals: BP 118/80   Pulse 88   Temp 98 F (36.7 C) (Oral)   Resp 14   Ht 5\' 4"  (1.626 m)   Wt 152 lb (68.9 kg)   SpO2 97%   BMI 26.09 kg/m  General: no acute distress  Psych:  Alert and oriented, normal mood and affect HEENT:  Normocephalic, atraumatic, supple neck  Cardiovascular:  RRR without murmur. no edema Respiratory:  Good breath sounds bilaterally, CTAB with normal respiratory effort Skin:  Warm, left upper back with 4-5cm annular dry patch w/o vesicles, central clearing or hives Neurologic:   Mental status is normal  Commons side effects, risks, benefits, and alternatives for medications and treatment plan prescribed today were discussed, and the patient expressed understanding of the given instructions. Patient  is instructed to call or message via MyChart if he/she has any questions or concerns regarding our treatment plan. No barriers to understanding were identified. We discussed Red Flag symptoms and signs in detail. Patient expressed understanding regarding what to do in case of urgent or emergency type symptoms.   Medication list was reconciled, printed and provided to the patient in AVS. Patient instructions and summary information was reviewed with the patient as documented in the AVS. This note was prepared with assistance of Dragon voice recognition software. Occasional wrong-word or sound-a-like substitutions may have occurred due to the inherent limitations of voice recognition software

## 2018-01-20 NOTE — Patient Instructions (Addendum)
Please return in 6 months for follow up of your hypertension.  Please go and get your mammogram done.    If you have any questions or concerns, please don't hesitate to send me a message via MyChart or call the office at [phone]. Thank you for visiting with [place] today! It's our pleasure caring for you.

## 2018-02-26 DIAGNOSIS — D225 Melanocytic nevi of trunk: Secondary | ICD-10-CM | POA: Diagnosis not present

## 2018-02-26 DIAGNOSIS — L2089 Other atopic dermatitis: Secondary | ICD-10-CM | POA: Diagnosis not present

## 2018-03-22 IMAGING — CR DG CHEST 2V
2 series · 2 of 2 positions shown · non-contrast
Comparison: 05/18/2013.

CLINICAL DATA: Mental status changes.  Fatigue.

EXAM:
CHEST  2 VIEW

[chest ap]
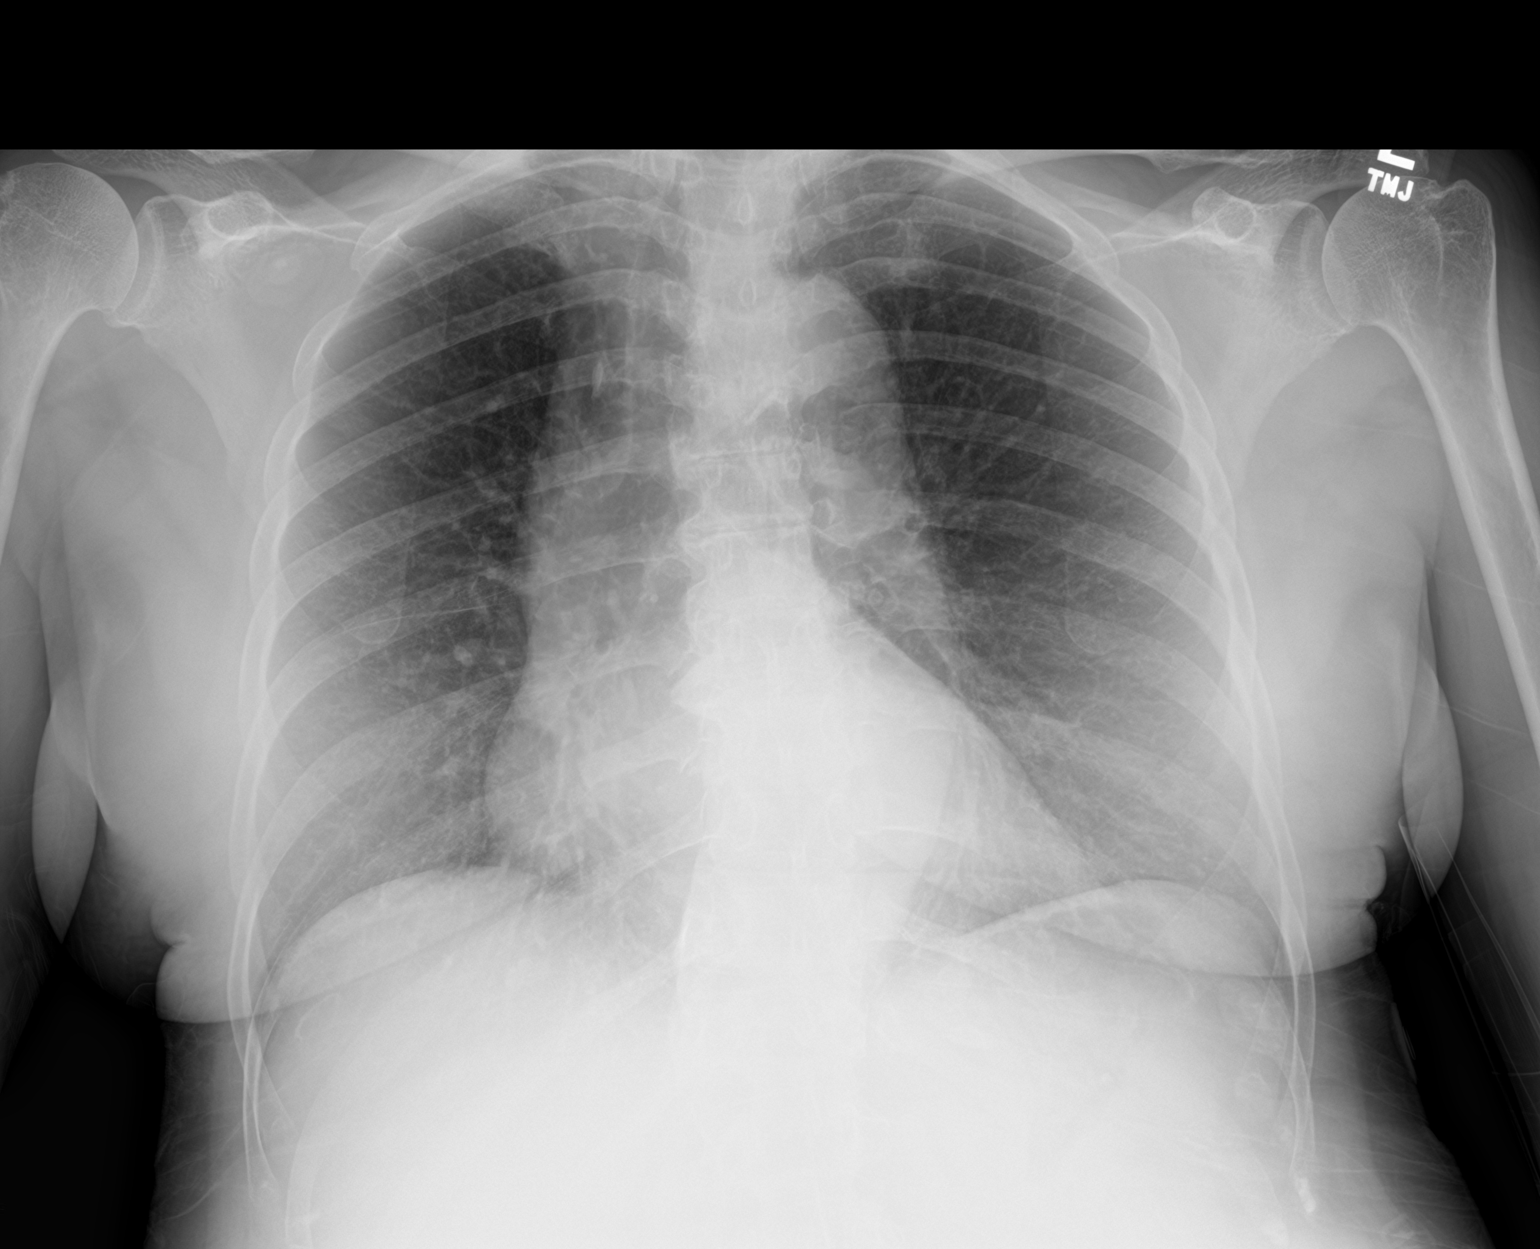

[chest lat]
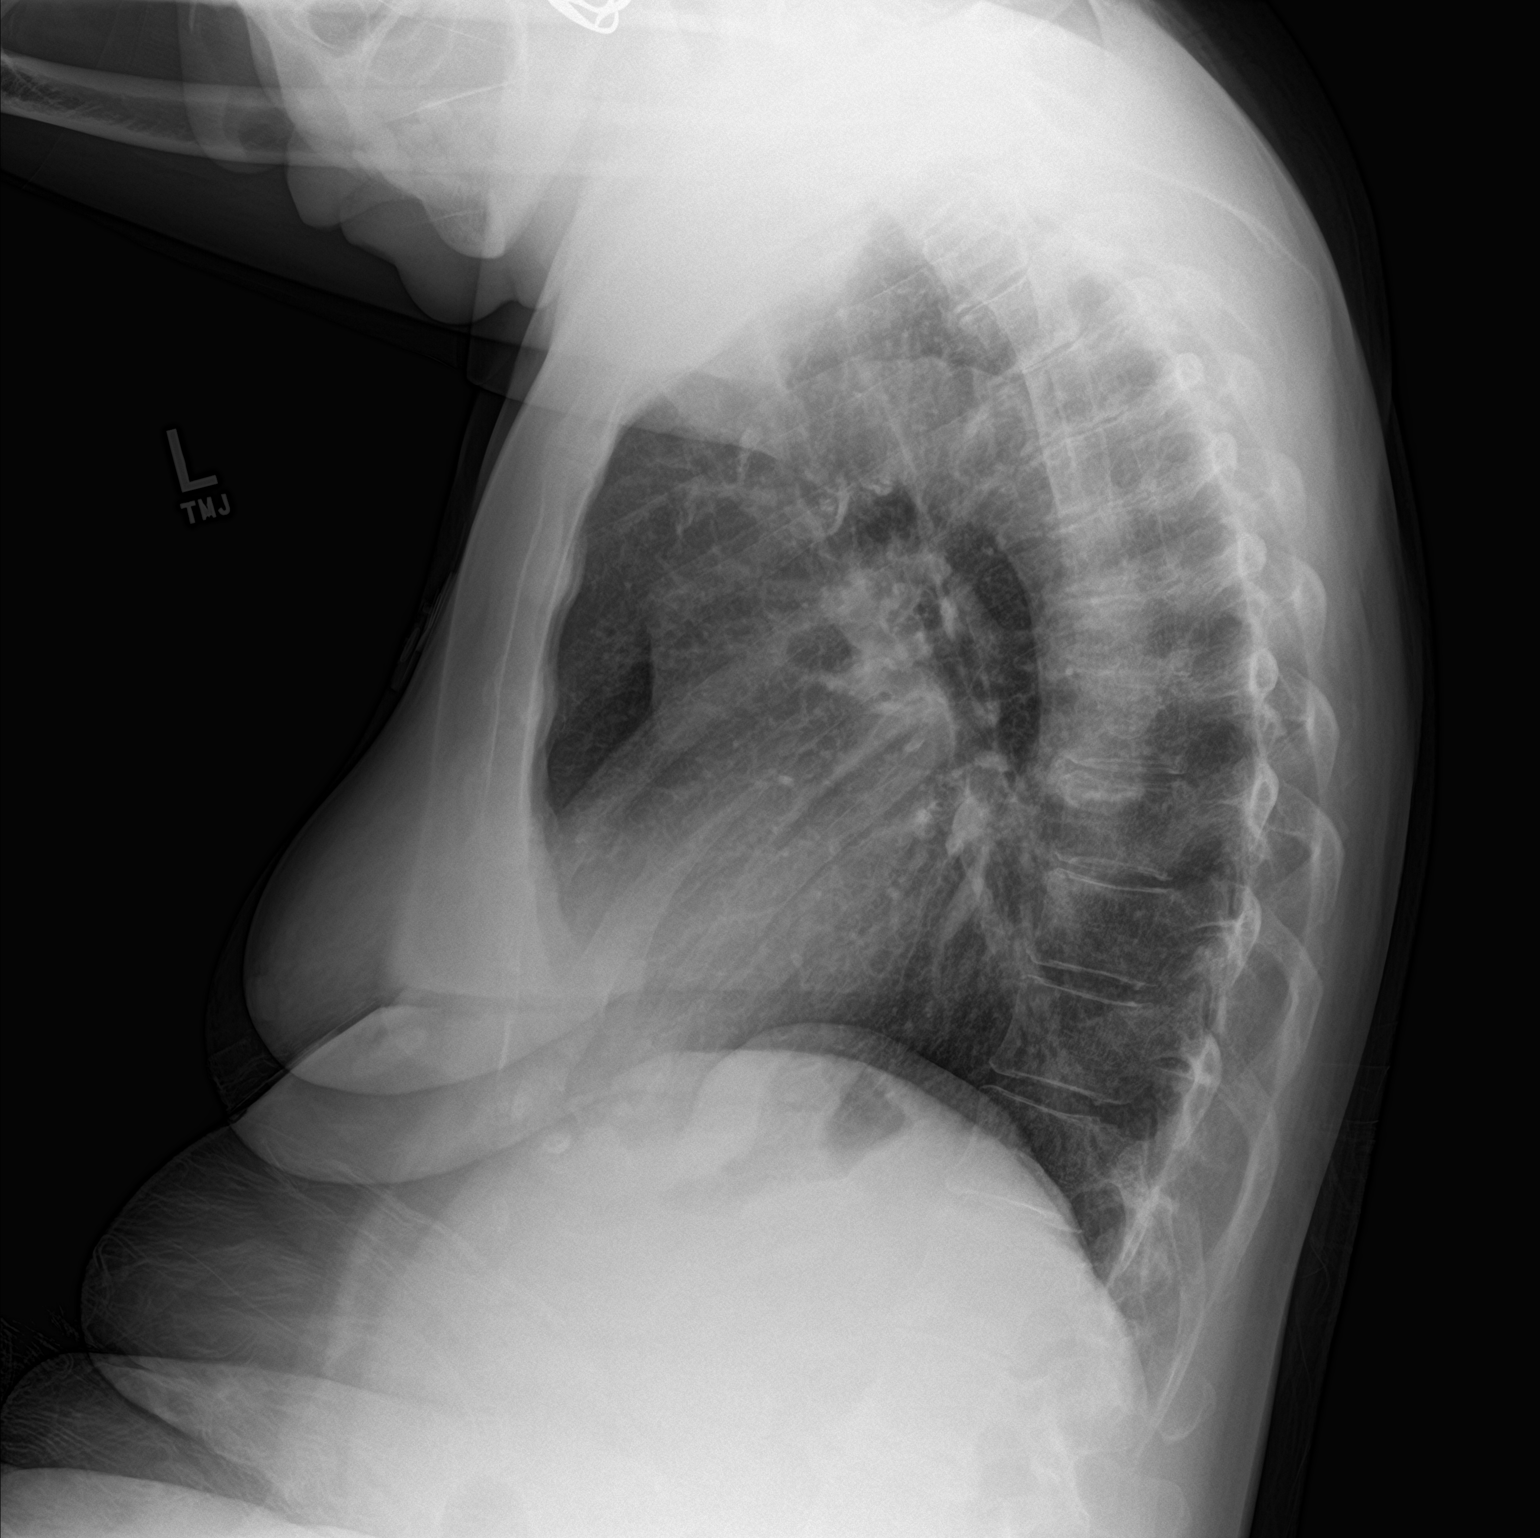

[2 of 2 positions shown; findings below may reference images not displayed]

FINDINGS: AP and lateral views of the chest were obtained. Cardiomediastinal
prominence likely accentuated by AP technique and slight rightward
patient rotation. Lungs are clear. The visualized bony structures of
the thorax are intact.
IMPRESSION: No acute cardiopulmonary findings.

## 2018-05-17 ENCOUNTER — Encounter: Payer: Self-pay | Admitting: Family Medicine

## 2018-05-17 DIAGNOSIS — R42 Dizziness and giddiness: Secondary | ICD-10-CM | POA: Insufficient documentation

## 2018-05-17 DIAGNOSIS — R55 Syncope and collapse: Secondary | ICD-10-CM

## 2018-05-28 ENCOUNTER — Telehealth: Payer: Self-pay | Admitting: Family Medicine

## 2018-05-28 NOTE — Telephone Encounter (Signed)
Pt dropped off DMV forms to be completed, placed in bin upfront w/charge sheet.

## 2018-05-31 NOTE — Telephone Encounter (Signed)
Forms are in provider's folder.

## 2018-05-31 NOTE — Telephone Encounter (Signed)
This is Dr [name] pt and the forms need to go to her at [place]

## 2018-06-01 NOTE — Telephone Encounter (Signed)
Sent forms inner office to Dr. Jonni Sanger at [facility] for her to complete.

## 2018-06-01 NOTE — Telephone Encounter (Signed)
Noted. Will complete forms when I receive them.  Please call patient to notify her of the change of my location and verify she would like to transfer with me or not.  Thanks.

## 2018-06-03 ENCOUNTER — Telehealth: Payer: Self-pay | Admitting: *Deleted

## 2018-06-03 NOTE — Telephone Encounter (Signed)
Called pt to make her aware that we are unable to complete her handicap form. She has no indications or diagnoses that would qualify her. I was unable to LM or get an answer.

## 2018-06-08 NOTE — Telephone Encounter (Signed)
See note

## 2018-06-08 NOTE — Telephone Encounter (Signed)
Pt called about picking up her completed handicap form. I advised her of the note from [place]. Pt would like tiara to call her new number @ [phone] / please advise

## 2018-06-09 NOTE — Telephone Encounter (Signed)
LMOVM to make her aware that we are unable to complete her handicap form. Dr. Jonni Sanger states she has no indications or diagnoses that would qualify her.

## 2018-07-27 ENCOUNTER — Ambulatory Visit (INDEPENDENT_AMBULATORY_CARE_PROVIDER_SITE_OTHER): Payer: Medicare HMO | Admitting: Family Medicine

## 2018-07-27 ENCOUNTER — Encounter: Payer: Self-pay | Admitting: Family Medicine

## 2018-07-27 ENCOUNTER — Other Ambulatory Visit: Payer: Self-pay

## 2018-07-27 VITALS — BP 130/84 | HR 79 | Temp 98.1°F | Resp 16 | Ht 64.0 in | Wt 161.2 lb

## 2018-07-27 DIAGNOSIS — I10 Essential (primary) hypertension: Secondary | ICD-10-CM | POA: Diagnosis not present

## 2018-07-27 DIAGNOSIS — J410 Simple chronic bronchitis: Secondary | ICD-10-CM

## 2018-07-27 DIAGNOSIS — Z1239 Encounter for other screening for malignant neoplasm of breast: Secondary | ICD-10-CM | POA: Diagnosis not present

## 2018-07-27 DIAGNOSIS — F17219 Nicotine dependence, cigarettes, with unspecified nicotine-induced disorders: Secondary | ICD-10-CM

## 2018-07-27 DIAGNOSIS — R69 Illness, unspecified: Secondary | ICD-10-CM | POA: Diagnosis not present

## 2018-07-27 MED ORDER — SHINGRIX 50 MCG/0.5ML IM SUSR: 0.5000 mL | Freq: Once | INTRAMUSCULAR | 0 refills | Status: AC

## 2018-07-27 NOTE — Patient Instructions (Addendum)
Please return in 6 months 12 months for your annual complete physical; please come fasting.  We will call you with information regarding your referral appointment. Mammogram at the [facility].  If you do not hear from [place] within the next 2 weeks, please let me know. It can take 1-2 weeks to get appointments set up with the specialists.   Please take the Shingrix RX to the pharmacy so you may get your vaccinations.   If you have any questions or concerns, please don't hesitate to send me a message via MyChart or call the office at [phone]. Thank you for visiting with [place] today! It's our pleasure caring for you.  It's time to QUIT smoking!    Steps to Quit Smoking  Smoking tobacco can be harmful to your health and can affect almost every organ in your body. Smoking puts you, and those around you, at risk for developing many serious chronic diseases. Quitting smoking is difficult, but it is one of the best things that you can do for your health. It is never too late to quit. What are the benefits of quitting smoking? When you quit smoking, you lower your risk of developing serious diseases and conditions, such as:  Lung cancer or lung disease, such as COPD.  Heart disease.  Stroke.  Heart attack.  Infertility.  Osteoporosis and bone fractures. Additionally, symptoms such as coughing, wheezing, and shortness of breath may get better when you quit. You may also find that you get sick less often because your body is stronger at fighting off colds and infections. If you are pregnant, quitting smoking can help to reduce your chances of having a baby of low birth weight. How do I get ready to quit? When you decide to quit smoking, create a plan to make sure that you are successful. Before you quit:  Pick a date to quit. Set a date within the next two weeks to give you time to prepare.  Write down the reasons why you are quitting. Keep this list in places where you will see it often,  such as on your bathroom mirror or in your car or wallet.  Identify the people, places, things, and activities that make you want to smoke (triggers) and avoid them. Make sure to take these actions: ? Throw away all cigarettes at home, at work, and in your car. ? Throw away smoking accessories, such as Scientist, research (medical). ? Clean your car and make sure to empty the ashtray. ? Clean your home, including curtains and carpets.  Tell your family, friends, and coworkers that you are quitting. Support from your loved ones can make quitting easier.  Talk with your health care provider about your options for quitting smoking.  Find out what treatment options are covered by your health insurance. What strategies can I use to quit smoking? Talk with your healthcare provider about different strategies to quit smoking. Some strategies include:  Quitting smoking altogether instead of gradually lessening how much you smoke over a period of time. Research shows that quitting "cold [place]" is more successful than gradually quitting.  Attending in-person counseling to help you build problem-solving skills. You are more likely to have success in quitting if you attend several counseling sessions. Even short sessions of 10 minutes can be effective.  Finding resources and support systems that can help you to quit smoking and remain smoke-free after you quit. These resources are most helpful when you use them often. They can include: ? Online  chats with a Social worker. ? Telephone quitlines. ? Careers information officer. ? Support groups or group counseling. ? Text messaging programs. ? Mobile phone applications.  Taking medicines to help you quit smoking. (If you are pregnant or breastfeeding, talk with your health care provider first.) Some medicines contain nicotine and some do not. Both types of medicines help with cravings, but the medicines that include nicotine help to relieve withdrawal symptoms.  Your health care provider may recommend: ? Nicotine patches, gum, or lozenges. ? Nicotine inhalers or sprays. ? Non-nicotine medicine that is taken by mouth. Talk with your health care provider about combining strategies, such as taking medicines while you are also receiving in-person counseling. Using these two strategies together makes you more likely to succeed in quitting than if you used either strategy on its own. If you are pregnant or breastfeeding, talk with your health care provider about finding counseling or other support strategies to quit smoking. Do not take medicine to help you quit smoking unless told to do so by your health care provider. What things can I do to make it easier to quit? Quitting smoking might feel overwhelming at first, but there is a lot that you can do to make it easier. Take these important actions:  Reach out to your family and friends and ask that they support and encourage you during this time. Call telephone quitlines, reach out to support groups, or work with a counselor for support.  Ask people who smoke to avoid smoking around you.  Avoid places that trigger you to smoke, such as bars, parties, or smoke-break areas at work.  Spend time around people who do not smoke.  Lessen stress in your life, because stress can be a smoking trigger for some people. To lessen stress, try: ? Exercising regularly. ? Deep-breathing exercises. ? Yoga. ? Meditating. ? Performing a body scan. This involves closing your eyes, scanning your body from head to toe, and noticing which parts of your body are particularly tense. Purposefully relax the muscles in those areas.  Download or purchase mobile phone or tablet apps (applications) that can help you stick to your quit plan by providing reminders, tips, and encouragement. There are many free apps, such as QuitGuide from the [employer] Office manager for Disease Control and Prevention). You can find other support for quitting smoking  (smoking cessation) through smokefree.gov and other websites. How will I feel when I quit smoking? Within the first 24 hours of quitting smoking, you may start to feel some withdrawal symptoms. These symptoms are usually most noticeable 2-3 days after quitting, but they usually do not last beyond 2-3 weeks. Changes or symptoms that you might experience include:  Mood swings.  Restlessness, anxiety, or irritation.  Difficulty concentrating.  Dizziness.  Strong cravings for sugary foods in addition to nicotine.  Mild weight gain.  Constipation.  Nausea.  Coughing or a sore throat.  Changes in how your medicines work in your body.  A depressed mood.  Difficulty sleeping (insomnia). After the first 2-3 weeks of quitting, you may start to notice more positive results, such as:  Improved sense of smell and taste.  Decreased coughing and sore throat.  Slower heart rate.  Lower blood pressure.  Clearer skin.  The ability to breathe more easily.  Fewer sick days. Quitting smoking is very challenging for most people. Do not get discouraged if you are not successful the first time. Some people need to make many attempts to quit before they achieve long-term success.  Do your best to stick to your quit plan, and talk with your health care provider if you have any questions or concerns. This information is not intended to replace advice given to you by your health care provider. Make sure you discuss any questions you have with your health care provider. Document Released: 01/21/2001 Document Revised: 09/02/2016 Document Reviewed: 06/13/2014 Elsevier Interactive Patient Education  2019 [employer].

## 2018-07-27 NOTE — Progress Notes (Signed)
Subjective  CC:  Chief Complaint  Patient presents with  . Hypertension    Checks her BPs at home, reports 130s/80-90  . Handicap Sticker    wants a renewal    HPI: Christine Hurley is a 68 y.o. female who presents to the office today to address the problems listed above in the chief complaint.  Hypertension f/u: Control is good . Pt reports she is doing well. taking medications as instructed, no medication side effects noted, no TIAs, no chest pain on exertion, no dyspnea on exertion, no swelling of ankles. No more lightheadedness. She denies adverse effects from his BP medications. Compliance with medication is good. Normal labs 01/2018  Smoking cessation: down to 2 cig per day and often doesn't even finish that. Could quit.   COPD mild. No breathing problems noted.   HM: overdue for mammogram. shingrix candidate: offered.   Requests handicap placard: apparently had one from before.   Assessment  1. Essential hypertension   2. Cigarette nicotine dependence with nicotine-induced disorder   3. Simple chronic bronchitis (HCC) Chronic  4. Breast cancer screening      Plan    Hypertension f/u: BP control is well controlled. Continue same meds.   Smoking cessation f/u: ready to quit. Discussed stopping completely. See AVS.   Copd is asymptomatic currently. Mild disease.   mammo and shingrix ordered.   Declined handicap placard: not indicated.  Education regarding management of these chronic disease states was given. Management strategies discussed on successive visits include dietary and exercise recommendations, goals of achieving and maintaining IBW, and lifestyle modifications aiming for adequate sleep and minimizing stressors.   Follow up: Return in about 6 months (around 01/26/2019) for complete physical.  Orders Placed This Encounter  Procedures  . MM DIGITAL SCREENING BILATERAL   Meds ordered this encounter  Medications  . Zoster Vaccine Adjuvanted [facility])  injection    Sig: Inject 0.5 mLs into the muscle once for 1 dose. Please give 2nd dose 2-6 months after first dose    Dispense:  2 each    Refill:  0      BP Readings from Last 3 Encounters:  07/27/18 130/84  01/20/18 118/80  12/18/17 132/86   Wt Readings from Last 3 Encounters:  07/27/18 161 lb 3.2 oz (73.1 kg)  01/20/18 152 lb (68.9 kg)  12/18/17 152 lb (68.9 kg)    Lab Results  Component Value Date   CHOL 176 09/22/2017   CHOL 247 (H) 06/18/2017   CHOL 143 11/08/2008   Lab Results  Component Value Date   HDL 77.40 09/22/2017   HDL 84.40 06/18/2017   HDL 53 11/08/2008   Lab Results  Component Value Date   LDLCALC 80 09/22/2017   LDLCALC 140 (H) 06/18/2017   LDLCALC 57 11/08/2008   Lab Results  Component Value Date   TRIG 91.0 09/22/2017   TRIG 117.0 06/18/2017   TRIG 166 (H) 11/08/2008   Lab Results  Component Value Date   CHOLHDL 2 09/22/2017   CHOLHDL 3 06/18/2017   CHOLHDL 2.7 Ratio 11/08/2008   No results found for: LDLDIRECT Lab Results  Component Value Date   CREATININE 0.73 12/18/2017   BUN 15 12/18/2017   NA 139 12/18/2017   K 3.8 12/18/2017   CL 105 12/18/2017   CO2 26 12/18/2017    The 10-year ASCVD risk score Mikey Bussing DC Jr., et al., 2013) is: 18.6%   Values used to calculate the score:     Age:  89 years     Sex: Female     Is Non-Hispanic African American: Yes     Diabetic: No     Tobacco smoker: Yes     Systolic Blood Pressure: 559 mmHg     Is BP treated: Yes     HDL Cholesterol: 77.4 mg/dL     Total Cholesterol: 176 mg/dL  I reviewed the patients updated PMH, FH, and SocHx.    Patient Active Problem List   Diagnosis Date Noted  . Cigarette nicotine dependence with nicotine-induced disorder 11/03/2016    Priority: High  . Essential hypertension 05/26/2013    Priority: High  . Chronic hepatitis C without hepatic coma ([place]) 11/04/2006    Priority: High  . Marijuana use, episodic 06/18/2017    Priority: Medium  . Osteopenia  of neck of femur 05/22/2016    Priority: Medium  . Abnormal thallium stress test 03/07/2015    Priority: Medium  . Chronic obstructive pulmonary disease ([place]) 07/01/2013    Priority: Medium  . Annual physical exam 12/18/2017    Allergies: Patient has no known allergies.  Social History: Patient  reports that she has been smoking cigarettes. She has a 45.00 pack-year smoking history. She has never used smokeless tobacco. She reports current alcohol use of about 2.0 standard drinks of alcohol per week. She reports current drug use. Drug: Marijuana.  Current Meds  Medication Sig  . albuterol (PROAIR HFA) 108 (90 Base) MCG/ACT inhaler Inhale into the lungs every 6 (six) hours as needed for wheezing or shortness of breath.   [name] amLODipine (NORVASC) 10 MG tablet Take 1 tablet (10 mg total) by mouth daily.  [name] atorvastatin (LIPITOR) 10 MG tablet Take 1 tablet (10 mg total) by mouth daily.  [name] lisinopril-hydrochlorothiazide (PRINZIDE,ZESTORETIC) 20-12.5 MG tablet Take 2 tablets by mouth daily.    Review of Systems: Cardiovascular: negative for chest pain, palpitations, leg swelling, orthopnea Respiratory: negative for SOB, wheezing or persistent cough Gastrointestinal: negative for abdominal pain Genitourinary: negative for dysuria or gross hematuria  Objective  Vitals: BP 130/84   Pulse 79   Temp 98.1 F (36.7 C) (Oral)   Resp 16   Ht 5\' 4"  (1.626 m)   Wt 161 lb 3.2 oz (73.1 kg)   SpO2 98%   BMI 27.67 kg/m  General: no acute distress , moves well Psych:  Alert and oriented, normal mood and affect HEENT:  Normocephalic, atraumatic, supple neck  Cardiovascular:  RRR without murmur. no edema Respiratory:  Good breath sounds bilaterally, CTAB with normal respiratory effort Skin:  Warm, no rashes Neurologic:   Mental status is normal  Commons side effects, risks, benefits, and alternatives for medications and treatment plan prescribed today were discussed, and the patient expressed  understanding of the given instructions. Patient is instructed to call or message via MyChart if he/she has any questions or concerns regarding our treatment plan. No barriers to understanding were identified. We discussed Red Flag symptoms and signs in detail. Patient expressed understanding regarding what to do in case of urgent or emergency type symptoms.   Medication list was reconciled, printed and provided to the patient in AVS. Patient instructions and summary information was reviewed with the patient as documented in the AVS. This note was prepared with assistance of Dragon voice recognition software. Occasional wrong-word or sound-a-like substitutions may have occurred due to the inherent limitations of voice recognition software

## 2018-08-27 DIAGNOSIS — H538 Other visual disturbances: Secondary | ICD-10-CM | POA: Diagnosis not present

## 2018-08-27 DIAGNOSIS — H259 Unspecified age-related cataract: Secondary | ICD-10-CM | POA: Diagnosis not present

## 2018-08-27 DIAGNOSIS — H02826 Cysts of left eye, unspecified eyelid: Secondary | ICD-10-CM | POA: Diagnosis not present

## 2018-10-05 ENCOUNTER — Ambulatory Visit: Payer: Medicare HMO

## 2018-11-23 ENCOUNTER — Other Ambulatory Visit: Payer: Self-pay | Admitting: Family Medicine

## 2018-12-18 ENCOUNTER — Other Ambulatory Visit: Payer: Self-pay | Admitting: Family Medicine

## 2018-12-22 ENCOUNTER — Ambulatory Visit: Payer: Medicare HMO | Admitting: Family Medicine

## 2019-01-10 ENCOUNTER — Other Ambulatory Visit: Payer: Self-pay

## 2019-01-11 ENCOUNTER — Ambulatory Visit: Payer: Medicare Other | Admitting: Family Medicine

## 2019-01-17 ENCOUNTER — Ambulatory Visit (INDEPENDENT_AMBULATORY_CARE_PROVIDER_SITE_OTHER): Payer: Medicare Other | Admitting: Family Medicine

## 2019-01-17 ENCOUNTER — Encounter: Payer: Self-pay | Admitting: Family Medicine

## 2019-01-17 ENCOUNTER — Other Ambulatory Visit: Payer: Self-pay

## 2019-01-17 VITALS — BP 122/74 | HR 76 | Temp 97.6°F | Ht 64.0 in | Wt 161.0 lb

## 2019-01-17 DIAGNOSIS — F17219 Nicotine dependence, cigarettes, with unspecified nicotine-induced disorders: Secondary | ICD-10-CM

## 2019-01-17 DIAGNOSIS — E2839 Other primary ovarian failure: Secondary | ICD-10-CM

## 2019-01-17 DIAGNOSIS — Z0000 Encounter for general adult medical examination without abnormal findings: Secondary | ICD-10-CM | POA: Diagnosis not present

## 2019-01-17 DIAGNOSIS — Z1231 Encounter for screening mammogram for malignant neoplasm of breast: Secondary | ICD-10-CM

## 2019-01-17 DIAGNOSIS — M85852 Other specified disorders of bone density and structure, left thigh: Secondary | ICD-10-CM | POA: Diagnosis not present

## 2019-01-17 DIAGNOSIS — I10 Essential (primary) hypertension: Secondary | ICD-10-CM | POA: Diagnosis not present

## 2019-01-17 DIAGNOSIS — E782 Mixed hyperlipidemia: Secondary | ICD-10-CM | POA: Diagnosis not present

## 2019-01-17 DIAGNOSIS — B182 Chronic viral hepatitis C: Secondary | ICD-10-CM | POA: Diagnosis not present

## 2019-01-17 LAB — LIPID PANEL
Cholesterol: 163 mg/dL (ref 0–200)
HDL: 68.9 mg/dL (ref >39.00)
NonHDL: 94.48
Total CHOL/HDL Ratio: 2
Triglycerides: 211 mg/dL — ABNORMAL HIGH (ref 0.0–149.0)
VLDL: 42.2 mg/dL — ABNORMAL HIGH (ref 0.0–40.0)

## 2019-01-17 LAB — COMPREHENSIVE METABOLIC PANEL
ALT: 11 U/L (ref 0–35)
AST: 15 U/L (ref 0–37)
Albumin: 4.4 g/dL (ref 3.5–5.2)
Alkaline Phosphatase: 66 U/L (ref 39–117)
BUN: 21 mg/dL (ref 6–23)
CO2: 25 mEq/L (ref 19–32)
Calcium: 9.5 mg/dL (ref 8.4–10.5)
Chloride: 104 mEq/L (ref 96–112)
Creatinine, Ser: 0.74 mg/dL (ref 0.40–1.20)
GFR: 94.21 mL/min (ref >60.00)
Glucose, Bld: 110 mg/dL — ABNORMAL HIGH (ref 70–99)
Potassium: 3.8 mEq/L (ref 3.5–5.1)
Sodium: 138 mEq/L (ref 135–145)
Total Bilirubin: 0.4 mg/dL (ref 0.2–1.2)
Total Protein: 7.6 g/dL (ref 6.0–8.3)

## 2019-01-17 LAB — CBC WITH DIFFERENTIAL/PLATELET
Basophils Absolute: 0 10*3/uL (ref 0.0–0.1)
Basophils Relative: 1 % (ref 0.0–3.0)
Eosinophils Absolute: 0.2 10*3/uL (ref 0.0–0.7)
Eosinophils Relative: 3.7 % (ref 0.0–5.0)
HCT: 38 % (ref 36.0–46.0)
Hemoglobin: 13 g/dL (ref 12.0–15.0)
Lymphocytes Relative: 27.4 % (ref 12.0–46.0)
Lymphs Abs: 1.2 10*3/uL (ref 0.7–4.0)
MCHC: 34.2 g/dL (ref 30.0–36.0)
MCV: 92 fl (ref 78.0–100.0)
Monocytes Absolute: 0.3 10*3/uL (ref 0.1–1.0)
Monocytes Relative: 5.9 % (ref 3.0–12.0)
Neutro Abs: 2.8 10*3/uL (ref 1.4–7.7)
Neutrophils Relative %: 62 % (ref 43.0–77.0)
Platelets: 309 10*3/uL (ref 150.0–400.0)
RBC: 4.13 Mil/uL (ref 3.87–5.11)
RDW: 13.5 % (ref 11.5–15.5)
WBC: 4.5 10*3/uL (ref 4.0–10.5)

## 2019-01-17 LAB — LDL CHOLESTEROL, DIRECT: Direct LDL: 72 mg/dL

## 2019-01-17 LAB — VITAMIN D 25 HYDROXY (VIT D DEFICIENCY, FRACTURES): VITD: 16.86 ng/mL — ABNORMAL LOW (ref 30.00–100.00)

## 2019-01-17 LAB — TSH: TSH: 1.48 u[IU]/mL (ref 0.35–4.50)

## 2019-01-17 NOTE — Progress Notes (Signed)
Subjective  Chief Complaint  Patient presents with   Annual Exam   Leg Pain    standing working on her feet; fast food   Hypertension   Hyperlipidemia    HPI: Christine Hurley is a 68 y.o. female who presents to [place] at [facility] today for a Female Wellness Visit. She also has the concerns and/or needs as listed above in the chief complaint. These will be addressed in addition to the Health Maintenance Visit.   Wellness Visit: annual visit with health maintenance review and exam without Pap   HM: due mammo and dexa: will schedule in march when dexa is due.f/u osteopenia. Defers for now due to covid. imms up to date. Due AWV and will schedule. CRC up to date. Feels well. Smoking 2-3/day and does not want to quit further at this time. Eye exam up to date  Chronic disease f/u and/or acute problem visit: (deemed necessary to be done in addition to the wellness visit):  HTN: Feeling well. Taking medications w/o adverse effects. No symptoms of CHF, angina; no palpitations, sob, cp or lower extremity edema. Compliant with meds.   HLD: tolerating statin well. Had been well controlled.   Smoker  Leg pain: aching after working all day on her feet. No sxs when she is not working. Hard floors at [employer]; wears steel toe hard shoes. No localized joint pain.   Assessment  1. Annual physical exam   2. Essential hypertension   3. Cigarette nicotine dependence with nicotine-induced disorder   4. Chronic hepatitis C without hepatic coma ([place])   5. Mixed hyperlipidemia   6. Encounter for screening mammogram for breast cancer   7. Osteopenia of neck of left femur   8. Hypoestrogenism      Plan  Female Wellness Visit:  Age appropriate Health Maintenance and Prevention measures were discussed with patient. Included topics are cancer screening recommendations, ways to keep healthy (see AVS) including dietary and exercise recommendations, regular eye and dental care, use  of seat belts, and avoidance of moderate alcohol use and tobacco use. mammo and dexa ordered  BMI: discussed patient's BMI and encouraged positive lifestyle modifications to help get to or maintain a target BMI.  HM needs and immunizations were addressed and ordered. See below for orders. See HM and immunization section for updates. utd  Routine labs and screening tests ordered including cmp, cbc and lipids where appropriate.  Discussed recommendations regarding Vit D and calcium supplementation (see AVS)  Chronic disease management visit and/or acute problem visit:  HTN and HLD are well controlled. Recheck labs today. No med changes.   Smoker: rec quitting; warned of risk of escalating use again. Will monitor. Pt precontemplative about quitting fully at this time  H/o hep c: no sxs. Follows with GI.   Follow up: Return in about 6 months (around 07/18/2019) for follow up Hypertension.  Orders Placed This Encounter  Procedures   mammo BC   Dexa BC   CBC w/Diff   CMP   Lipids   TSH   Vit D 25OH   No orders of the defined types were placed in this encounter.     Lifestyle: Body mass index is 27.64 kg/m. Wt Readings from Last 3 Encounters:  01/17/19 161 lb (73 kg)  07/27/18 161 lb 3.2 oz (73.1 kg)  01/20/18 152 lb (68.9 kg)     Patient Active Problem List   Diagnosis Date Noted   Mixed hyperlipidemia 01/17/2019  Priority: High   Cigarette nicotine dependence with nicotine-induced disorder 11/03/2016    Priority: High   Essential hypertension 05/26/2013    Priority: High   Chronic hepatitis C without hepatic coma ([place]) 11/04/2006    Priority: High    Managed by Dr. Arta Silence; no effects on liver; completed treatment 2019    Marijuana use, episodic 06/18/2017    Priority: Medium    Reports uses it for her leg pain    Osteopenia of neck of femur 05/22/2016    Priority: Medium    dexa 04/2016: T = - 1.4 lowest at left femoral neck; nl at lumbar  spine.     Abnormal thallium stress test 03/07/2015    Priority: Medium    Small, partially reversible inferolateral ischemia. Marked hypertensive response to exercise. Low risk.      Chronic obstructive pulmonary disease ([place]) 07/01/2013    Priority: Medium    PFTs 2015: normal flow volumes with mild obstructive disease by shape of curves and no response to bronchodilator.     Health Maintenance  Topic Date Due   MAMMOGRAM  01/22/2017   DEXA SCAN  05/02/2019   COLONOSCOPY  07/24/2024   INFLUENZA VACCINE  Completed   Hepatitis C Screening  Completed   PNA vac Low Risk Adult  Completed   TETANUS/TDAP  Discontinued   Immunization History  Administered Date(s) Administered   Influenza Split 04/24/2013   Influenza, High Dose Seasonal PF 09/14/2016, 12/18/2017, 10/11/2018   Influenza, Seasonal, Injecte, Preservative Fre 11/24/2015   Influenza,inj,Quad PF,6+ Mos 11/24/2015   Influenza,inj,quad, With Preservative 03/21/2013   PPD Test 04/02/2015   Pneumococcal Conjugate-13 06/24/2017   Pneumococcal Polysaccharide-23 07/03/2014, 11/24/2015   Pneumococcal-Unspecified 07/03/2014, 11/24/2015   Td 08/10/2008   Zoster Recombinat (Shingrix) 08/10/2018, 08/27/2018, 10/11/2018   We updated and reviewed the patient's past history in detail and it is documented below. Allergies: Patient has No Known Allergies. Past Medical History Patient  has a past medical history of Abnormal nuclear stress test (February '13), Arthritis, Chest pain with minimal risk for cardiac etiology (February 2013), COPD (chronic obstructive pulmonary disease) ([place]), Depression, Hepatitis C, Hypertension, Osteopenia of spine (05/22/2016), and Right tibial fracture ( ). Past Surgical History Patient  has a past surgical history that includes Tibia fracture surgery (Right, 09/2008); doppler echocardiography (03/26/2011); and NM MYOVIEW LTD (03/27/11). Family History: Patient family history includes  Asthma in her mother; Cancer in her mother; Cancer - Prostate in her father. Social History:  Patient  reports that she has been smoking cigarettes. She has a 45.00 pack-year smoking history. She has never used smokeless tobacco. She reports current alcohol use of about 2.0 standard drinks of alcohol per week. She reports current drug use. Drug: Marijuana.  Review of Systems: Constitutional: negative for fever or malaise Ophthalmic: negative for photophobia, double vision or loss of vision Cardiovascular: negative for chest pain, dyspnea on exertion, or new LE swelling Respiratory: negative for SOB or persistent cough Gastrointestinal: negative for abdominal pain, change in bowel habits or melena Genitourinary: negative for dysuria or gross hematuria, no abnormal uterine bleeding or disharge Musculoskeletal: negative for new gait disturbance or muscular weakness Integumentary: negative for new or persistent rashes, no breast lumps Neurological: negative for TIA or stroke symptoms Psychiatric: negative for SI or delusions Allergic/Immunologic: negative for hives  Patient Care Team    Relationship Specialty Notifications Start End  Leamon Arnt, MD PCP - General Family Medicine  06/18/17   Arta Silence, MD Consulting Physician Gastroenterology  01/20/18     Objective  Vitals: BP 122/74 (BP Location: Right Arm, Patient Position: Sitting, Cuff Size: Normal)    Pulse 76    Temp 97.6 F (36.4 C) (Temporal)    Ht 5\' 4"  (1.626 m)    Wt 161 lb (73 kg)    SpO2 97%    BMI 27.64 kg/m  General:  Well developed, well nourished, no acute distress  Psych:  Alert and orientedx3,normal mood and affect HEENT:  Normocephalic, atraumatic, non-icteric sclera, PERRL, oropharynx is clear without mass or exudate, supple neck without adenopathy, mass or thyromegaly Cardiovascular:  Normal S1, S2, RRR without gallop, rub or murmur Respiratory:  Good breath sounds bilaterally, CTAB with normal respiratory  effort Gastrointestinal: normal bowel sounds, soft, non-tender, no noted masses. No HSM MSK: no deformities, contusions. Joints are without erythema or swelling. Spine and CVA region are nontender Skin:  Warm, no rashes or suspicious lesions noted Neurologic:    Mental status is normal. CN 2-11 are normal. Gross motor and sensory exams are normal. Normal gait. No tremor Breast Exam: No mass, skin retraction or nipple discharge is appreciated in either breast. No axillary adenopathy. Fibrocystic changes are not noted    Commons side effects, risks, benefits, and alternatives for medications and treatment plan prescribed today were discussed, and the patient expressed understanding of the given instructions. Patient is instructed to call or message via MyChart if he/she has any questions or concerns regarding our treatment plan. No barriers to understanding were identified. We discussed Red Flag symptoms and signs in detail. Patient expressed understanding regarding what to do in case of urgent or emergency type symptoms.   Medication list was reconciled, printed and provided to the patient in AVS. Patient instructions and summary information was reviewed with the patient as documented in the AVS. This note was prepared with assistance of Dragon voice recognition software. Occasional wrong-word or sound-a-like substitutions may have occurred due to the inherent limitations of voice recognition software  This visit occurred during the SARS-CoV-2 public health emergency.  Safety protocols were in place, including screening questions prior to the visit, additional usage of staff PPE, and extensive cleaning of exam room while observing appropriate contact time as indicated for disinfecting solutions.

## 2019-01-17 NOTE — Patient Instructions (Addendum)
Please return in 6 months for follow up of your hypertension. Medicare recommends an Annual Wellness Visit for all patients. Please schedule this to be done with our Nurse Educator, Loma Sousa. This is an informative "talk" visit; it's goals are to ensure that your health care needs are being met and to give you education regarding avoiding falls, ensuring you are not suffering from depression or problems with memory or thinking, and to educate you on Advance Care Planning. It helps me take good care of you!  We will call you with lab results.   We will call you to schedule your bone density test and mammogram for March 2021.   If you have any questions or concerns, please don't hesitate to send me a message via MyChart or call the office at [phone]. Thank you for visiting with Christine Hurley today! It's our pleasure caring for you.   Preventive Care 68 Years and Older, Female Preventive care refers to lifestyle choices and visits with your health care provider that can promote health and wellness. This includes:  A yearly physical exam. This is also called an annual well check.  Regular dental and eye exams.  Immunizations.  Screening for certain conditions.  Healthy lifestyle choices, such as diet and exercise. What can I expect for my preventive care visit? Physical exam Your health care provider will check:  Height and weight. These may be used to calculate body mass index (BMI), which is a measurement that tells if you are at a healthy weight.  Heart rate and blood pressure.  Your skin for abnormal spots. Counseling Your health care provider may ask you questions about:  Alcohol, tobacco, and drug use.  Emotional well-being.  Home and relationship well-being.  Sexual activity.  Eating habits.  History of falls.  Memory and ability to understand (cognition).  Work and work Statistician.  Pregnancy and menstrual history. What immunizations do I need?  Influenza (flu)  vaccine  This is recommended every year. Tetanus, diphtheria, and pertussis (Tdap) vaccine  You may need a Td booster every 10 years. Varicella (chickenpox) vaccine  You may need this vaccine if you have not already been vaccinated. Zoster (shingles) vaccine  You may need this after age 68. Pneumococcal conjugate (PCV13) vaccine  One dose is recommended after age 68. Pneumococcal polysaccharide (PPSV23) vaccine  One dose is recommended after age 68. Measles, mumps, and rubella (MMR) vaccine  You may need at least one dose of MMR if you were born in 1957 or later. You may also need a second dose. Meningococcal conjugate (MenACWY) vaccine  You may need this if you have certain conditions. Hepatitis A vaccine  You may need this if you have certain conditions or if you travel or work in places where you may be exposed to hepatitis A. Hepatitis B vaccine  You may need this if you have certain conditions or if you travel or work in places where you may be exposed to hepatitis B. Haemophilus influenzae type b (Hib) vaccine  You may need this if you have certain conditions. You may receive vaccines as individual doses or as more than one vaccine together in one shot (combination vaccines). Talk with your health care provider about the risks and benefits of combination vaccines. What tests do I need? Blood tests  Lipid and cholesterol levels. These may be checked every 5 years, or more frequently depending on your overall health.  Hepatitis C test.  Hepatitis B test. Screening  Lung cancer screening. You may have  this screening every year starting at age 68 if you have a 30-pack-year history of smoking and currently smoke or have quit within the past 15 years.  Colorectal cancer screening. All adults should have this screening starting at age 68 and continuing until age 71. Your health care provider may recommend screening at age 68 if you are at increased risk. You will have  tests every 1-10 years, depending on your results and the type of screening test.  Diabetes screening. This is done by checking your blood sugar (glucose) after you have not eaten for a while (fasting). You may have this done every 1-3 years.  Mammogram. This may be done every 1-2 years. Talk with your health care provider about how often you should have regular mammograms.  BRCA-related cancer screening. This may be done if you have a family history of breast, ovarian, tubal, or peritoneal cancers. Other tests  Sexually transmitted disease (STD) testing.  Bone density scan. This is done to screen for osteoporosis. You may have this done starting at age 68. Follow these instructions at home: Eating and drinking  Eat a diet that includes fresh fruits and vegetables, whole grains, lean protein, and low-fat dairy products. Limit your intake of foods with high amounts of sugar, saturated fats, and salt.  Take vitamin and mineral supplements as recommended by your health care provider.  Do not drink alcohol if your health care provider tells you not to drink.  If you drink alcohol: ? Limit how much you have to 0-1 drink a day. ? Be aware of how much alcohol is in your drink. In the [place] one drink equals one 12 oz bottle of beer (355 mL), one 5 oz glass of wine (148 mL), or one 1 oz glass of hard liquor (44 mL). Lifestyle  Take daily care of your teeth and gums.  Stay active. Exercise for at least 30 minutes on 5 or more days each week.  Do not use any products that contain nicotine or tobacco, such as cigarettes, e-cigarettes, and chewing tobacco. If you need help quitting, ask your health care provider.  If you are sexually active, practice safe sex. Use a condom or other form of protection in order to prevent STIs (sexually transmitted infections).  Talk with your health care provider about taking a low-dose aspirin or statin. What's next?  Go to your health care provider once a  year for a well check visit.  Ask your health care provider how often you should have your eyes and teeth checked.  Stay up to date on all vaccines. This information is not intended to replace advice given to you by your health care provider. Make sure you discuss any questions you have with your health care provider. Document Released: 02/23/2015 Document Revised: 01/21/2018 Document Reviewed: 01/21/2018 Elsevier Patient Education  2020 [employer].

## 2019-01-18 MED ORDER — VITAMIN D (ERGOCALCIFEROL) 1.25 MG (50000 UNIT) PO CAPS: 50000.0000 [IU] | ORAL_CAPSULE | ORAL | 0 refills | Status: AC

## 2019-01-18 NOTE — Progress Notes (Signed)
Please call patient: I have reviewed his/her lab results. Lab work looks good: cholesterol is excellent (nonfasting). Vit D level is low and this could be contributing to her leg pain: I've ordered a vit D supplement to take once a week for 12 weeks; in addition please take otc vit D 2000 units daily. No other changes are needed at this time.

## 2019-01-18 NOTE — Addendum Note (Signed)
Addended by: Billey Chang on: 01/18/2019 08:55 AM   Modules accepted: Orders

## 2019-01-20 ENCOUNTER — Ambulatory Visit (INDEPENDENT_AMBULATORY_CARE_PROVIDER_SITE_OTHER): Payer: Medicare Other

## 2019-01-20 ENCOUNTER — Other Ambulatory Visit: Payer: Self-pay

## 2019-01-20 DIAGNOSIS — Z0000 Encounter for general adult medical examination without abnormal findings: Secondary | ICD-10-CM | POA: Diagnosis not present

## 2019-01-20 NOTE — Progress Notes (Signed)
This visit is being conducted via phone call due to the COVID-19 pandemic. This patient has given me verbal consent via phone to conduct this visit, patient states they are participating from their home address. Some vital signs may be absent or patient reported.   Patient identification: identified by name, DOB, and current address.  Location provider: [facility], Office Persons participating in the virtual visit: Denman George LPN, patient, and Dr. Billey Chang     Subjective:   Christine Hurley is a 68 y.o. female who presents for an Initial Medicare Annual Wellness Visit.  Review of Systems     Cardiac Risk Factors include: advanced age (>42men, >13 women);hypertension;dyslipidemia    Objective:    There were no vitals filed for this visit. There is no height or weight on file to calculate BMI.  Advanced Directives 01/20/2019 04/23/2017 04/22/2016 11/24/2015 02/09/2014  Does Patient Have a Medical Advance Directive? No No No No No  Would patient like information on creating a medical advance directive? Yes (MAU/Ambulatory/Procedural Areas - Information given) - - No - patient declined information -    Current Medications (verified) Outpatient Encounter Medications as of 01/20/2019  Medication Sig  . albuterol (PROAIR HFA) 108 (90 Base) MCG/ACT inhaler Inhale into the lungs every 6 (six) hours as needed for wheezing or shortness of breath.   [name] amLODipine (NORVASC) 10 MG tablet TAKE 1 TABLET BY MOUTH EVERY DAY  . atorvastatin (LIPITOR) 10 MG tablet TAKE 1 TABLET BY MOUTH DAILY  . lisinopril-hydrochlorothiazide (ZESTORETIC) 20-12.5 MG tablet TAKE 2 TABLETS BY MOUTH EVERY DAY  . triamcinolone cream (KENALOG) 0.1 % Apply 1 application topically 2 (two) times daily. For 2 weeks, then as needed  . Vitamin D, Ergocalciferol, (DRISDOL) 1.25 MG (50000 UT) CAPS capsule Take 1 capsule (50,000 Units total) by mouth once a week. (Patient not taking: Reported on 01/20/2019)   No  facility-administered encounter medications on file as of 01/20/2019.    Allergies (verified) Patient has no known allergies.   History: Past Medical History:  Diagnosis Date  . Abnormal nuclear stress test February '13   Small, partially reversible inferolateral ischemia. Marked hypertensive response to exercise. Low risk.  . Arthritis   . Chest pain with minimal risk for cardiac etiology February 2013   Evaluated with echocardiogram and Myoview, do  . COPD (chronic obstructive pulmonary disease) ([place])   . Depression   . Hepatitis C   . Hypertension   . Osteopenia of spine 05/22/2016  . Right tibial fracture     Past Surgical History:  Procedure Laterality Date  . DOPPLER ECHOCARDIOGRAPHY  03/26/2011   LVEF>55% normal LV wall thickness, normal LA, mild aortic sclerosis with trace to mild AI, mild to moderate TR, RVSP of 32mmHg, RA pressure about 5 mmHg  . NM MYOVIEW LTD  03/27/11   post stress ejection fraction is 72%, abnormal myocardial perfusion study, this is a low risk scan  . TIBIA FRACTURE SURGERY Right 09/2008   fracture of right lower leg with open reduction and internal fixation   Family History  Problem Relation Age of Onset  . Asthma Mother   . Cancer Mother        Brain  . Cancer - Prostate Father    Social History   Socioeconomic History  . Marital status: Divorced    Spouse name: Not on file  . Number of children: 1  . Years of education: Not on file  . Highest education level: Not on file  Occupational History  . Not on file  Tobacco Use  . Smoking status: Current Every Day Smoker    Packs/day: 1.00    Years: 45.00    Pack years: 45.00    Types: Cigarettes  . Smokeless tobacco: Never Used  . Tobacco comment: 2 cigs per day   Substance and Sexual Activity  . Alcohol use: Yes    Alcohol/week: 2.0 standard drinks    Types: 2 Cans of beer per week    Comment: socially   . Drug use: Yes    Types: Marijuana    Comment: 2 days ago   . Sexual  activity: Yes    Birth control/protection: Post-menopausal  Other Topics Concern  . Not on file  Social History Narrative  . Not on file   Social Determinants of Health   Financial Resource Strain:   . Difficulty of Paying Living Expenses: Not on file  Food Insecurity:   . Worried About Charity fundraiser in the Last Year: Not on file  . Ran Out of Food in the Last Year: Not on file  Transportation Needs:   . Lack of Transportation (Medical): Not on file  . Lack of Transportation (Non-Medical): Not on file  Physical Activity:   . Days of Exercise per Week: Not on file  . Minutes of Exercise per Session: Not on file  Stress:   . Feeling of Stress : Not on file  Social Connections:   . Frequency of Communication with Friends and Family: Not on file  . Frequency of Social Gatherings with Friends and Family: Not on file  . Attends Religious Services: Not on file  . Active Member of Clubs or Organizations: Not on file  . Attends Archivist Meetings: Not on file  . Marital Status: Not on file    Tobacco Counseling Ready to quit: Not Answered Counseling given: Not Answered Comment: 2 cigs per day    Clinical Intake:  Pre-visit preparation completed: Yes  Pain : No/denies pain  Diabetes: No  How often do you need to have someone help you when you read instructions, pamphlets, or other written materials from your doctor or pharmacy?: 1 - Never  Interpreter Needed?: No  Information entered by :: Denman George LPN   Activities of Daily Living In your present state of health, do you have any difficulty performing the following activities: 01/20/2019  Hearing? N  Vision? N  Difficulty concentrating or making decisions? N  Walking or climbing stairs? N  Dressing or bathing? N  Doing errands, shopping? N  Preparing Food and eating ? N  Using the Toilet? N  Do you have problems with loss of bowel control? N  Managing your Medications? N  Managing your  Finances? N  Housekeeping or managing your Housekeeping? N  Some recent data might be hidden     Immunizations and Health Maintenance Immunization History  Administered Date(s) Administered  . Influenza Split 04/24/2013  . Influenza, High Dose Seasonal PF 09/14/2016, 12/18/2017, 10/11/2018  . Influenza, Seasonal, Injecte, Preservative Fre 11/24/2015  . Influenza,inj,Quad PF,6+ Mos 11/24/2015  . Influenza,inj,quad, With Preservative 03/21/2013  . PPD Test 04/02/2015  . Pneumococcal Conjugate-13 06/24/2017  . Pneumococcal Polysaccharide-23 07/03/2014, 11/24/2015  . Pneumococcal-Unspecified 07/03/2014, 11/24/2015  . Td 08/10/2008  . Zoster Recombinat (Shingrix) 08/10/2018, 08/27/2018, 10/11/2018   Health Maintenance Due  Topic Date Due  . MAMMOGRAM  01/22/2017    Patient Care Team: Leamon Arnt, MD as PCP - General (Family Medicine)  Arta Silence, MD as Consulting Physician (Gastroenterology) Gevena Cotton, MD as Consulting Physician (Ophthalmology) Harriett Sine, MD as Consulting Physician (Dermatology)  Indicate any recent Medical Services you may have received from other than [facility] providers in the past year (date may be approximate).     Assessment:   This is a routine wellness examination for Christine Hurley.  Hearing/Vision screen No exam data present  Dietary issues and exercise activities discussed: Current Exercise Habits: The patient has a physically strenuous job, but has no regular exercise apart from work.  Goals    . Quit Smoking      Depression Screen PHQ 2/9 Scores 01/20/2019 07/27/2018 06/18/2017 06/18/2017  PHQ - 2 Score 0 0 0 0  PHQ- 9 Score - - - 0    Fall Risk Fall Risk  01/20/2019 07/27/2018 06/18/2017  Falls in the past year? 0 0 No  Number falls in past yr: - 0 -  Injury with Fall? 0 0 -  Follow up Falls evaluation completed;Education provided;Falls prevention discussed Falls evaluation completed -    Is the patient's home free of loose  throw rugs in walkways, pet beds, electrical cords, etc?   yes      Grab bars in the bathroom? yes      Handrails on the stairs?   yes      Adequate lighting?   yes   Cognitive Function: no cognitive concerns at this time  Cognitive Testing  Alert? Yes         Normal Appearance? N/a Oriented to person? Yes           Place? Yes  Time? Yes  Recall of three objects? Yes  Can perform simple calculations? Yes  Displays appropriate judgment? Yes  Can read the correct time from a watch face? Yes     Screening Tests Health Maintenance  Topic Date Due  . MAMMOGRAM  01/22/2017  . DEXA SCAN  05/02/2019  . COLONOSCOPY  07/24/2024  . INFLUENZA VACCINE  Completed  . Hepatitis C Screening  Completed  . PNA vac Low Risk Adult  Completed  . TETANUS/TDAP  Discontinued    Qualifies for Shingles Vaccine? Shingrix completed   Cancer Screenings: Lung: Low Dose CT Chest recommended if Age 42-80 years, 30 pack-year currently smoking OR have quit w/in 15years. Patient does qualify. Breast: Up to date on Mammogram? Yes, ordered Up to date of Bone Density/Dexa? Yes, ordered  Colorectal: colonoscopy 07/25/14   Plan:  I have personally reviewed and addressed the Medicare Annual Wellness questionnaire and have noted the following in the patient's chart:  A. Medical and social history B. Use of alcohol, tobacco or illicit drugs  C. Current medications and supplements D. Functional ability and status E.  Nutritional status F.  Physical activity G. Advance directives H. List of other physicians I.  Hospitalizations, surgeries, and ER visits in previous 12 months J.  [place] such as hearing and vision if needed, cognitive and depression L. Referrals, records requested, and appointments- none   In addition, I have reviewed and discussed with patient certain preventive protocols, quality metrics, and best practice recommendations. A written personalized care plan for preventive services  as well as general preventive health recommendations were provided to patient.   Signed,  Denman George, LPN  Nurse Health Advisor   Nurse Notes: Patient unable to afford Vitamin D rx and has not started.  Will look to see what coverage is with insurance.

## 2019-01-20 NOTE — Patient Instructions (Addendum)
Christine Hurley , Thank you for taking time to come for your Medicare Wellness Visit. I appreciate your ongoing commitment to your health goals. Please review the following plan we discussed and let me know if I can assist you in the future.   Screening recommendations/referrals: Colorectal Screening: up to date; last colonoscopy 07/25/14 Mammogram: ordered at last visit Bone Density: ordered at last visit   Vision and Dental Exams: Recommended annual ophthalmology exams for early detection of glaucoma and other disorders of the eye Recommended annual dental exams for proper oral hygiene  Vaccinations: Influenza vaccine: completed 10/11/18 Pneumococcal vaccine: up to date; last 06/24/17 Tdap vaccine: recommended  Shingles vaccine: Shingrix completed   Advanced directives: I have provided a copy for you to complete at home and have notarized. Once this is complete please bring a copy in to our office so we can scan it into your chart.  Goals: Recommend to drink at least 6-8 8oz glasses of water per day and consume a balanced diet rich in fresh fruits and vegetables.   Next appointment: Please schedule your Annual Wellness Visit with your Nurse Health Advisor in one year.  Preventive Care 26 Years and Older, Female Preventive care refers to lifestyle choices and visits with your health care provider that can promote health and wellness. What does preventive care include?  A yearly physical exam. This is also called an annual well check.  Dental exams once or twice a year.  Routine eye exams. Ask your health care provider how often you should have your eyes checked.  Personal lifestyle choices, including:  Daily care of your teeth and gums.  Regular physical activity.  Eating a healthy diet.  Avoiding tobacco and drug use.  Limiting alcohol use.  Practicing safe sex.  Taking low-dose aspirin every day if recommended by your health care provider.  Taking vitamin and mineral  supplements as recommended by your health care provider. What happens during an annual well check? The services and screenings done by your health care provider during your annual well check will depend on your age, overall health, lifestyle risk factors, and family history of disease. Counseling  Your health care provider may ask you questions about your:  Alcohol use.  Tobacco use.  Drug use.  Emotional well-being.  Home and relationship well-being.  Sexual activity.  Eating habits.  History of falls.  Memory and ability to understand (cognition).  Work and work Statistician.  Reproductive health. Screening  You may have the following tests or measurements:  Height, weight, and BMI.  Blood pressure.  Lipid and cholesterol levels. These may be checked every 5 years, or more frequently if you are over 50 years old.  Skin check.  Lung cancer screening. You may have this screening every year starting at age 76 if you have a 30-pack-year history of smoking and currently smoke or have quit within the past 15 years.  Fecal occult blood test (FOBT) of the stool. You may have this test every year starting at age 91.  Flexible sigmoidoscopy or colonoscopy. You may have a sigmoidoscopy every 5 years or a colonoscopy every 10 years starting at age 8.  Hepatitis C blood test.  Hepatitis B blood test.  Sexually transmitted disease (STD) testing.  Diabetes screening. This is done by checking your blood sugar (glucose) after you have not eaten for a while (fasting). You may have this done every 1-3 years.  Bone density scan. This is done to screen for osteoporosis. You may have  this done starting at age 61.  Mammogram. This may be done every 1-2 years. Talk to your health care provider about how often you should have regular mammograms. Talk with your health care provider about your test results, treatment options, and if necessary, the need for more tests. Vaccines  Your  health care provider may recommend certain vaccines, such as:  Influenza vaccine. This is recommended every year.  Tetanus, diphtheria, and acellular pertussis (Tdap, Td) vaccine. You may need a Td booster every 10 years.  Zoster vaccine. You may need this after age 90.  Pneumococcal 13-valent conjugate (PCV13) vaccine. One dose is recommended after age 14.  Pneumococcal polysaccharide (PPSV23) vaccine. One dose is recommended after age 19. Talk to your health care provider about which screenings and vaccines you need and how often you need them. This information is not intended to replace advice given to you by your health care provider. Make sure you discuss any questions you have with your health care provider. Document Released: 02/23/2015 Document Revised: 10/17/2015 Document Reviewed: 11/28/2014 Elsevier Interactive Patient Education  2017 [place] Prevention in the Home Falls can cause injuries. They can happen to people of all ages. There are many things you can do to make your home safe and to help prevent falls. What can I do on the outside of my home?  Regularly fix the edges of walkways and driveways and fix any cracks.  Remove anything that might make you trip as you walk through a door, such as a raised step or threshold.  Trim any bushes or trees on the path to your home.  Use bright outdoor lighting.  Clear any walking paths of anything that might make someone trip, such as rocks or tools.  Regularly check to see if handrails are loose or broken. Make sure that both sides of any steps have handrails.  Any raised decks and porches should have guardrails on the edges.  Have any leaves, snow, or ice cleared regularly.  Use sand or salt on walking paths during winter.  Clean up any spills in your garage right away. This includes oil or grease spills. What can I do in the bathroom?  Use night lights.  Install grab bars by the toilet and in the tub and  shower. Do not use towel bars as grab bars.  Use non-skid mats or decals in the tub or shower.  If you need to sit down in the shower, use a plastic, non-slip stool.  Keep the floor dry. Clean up any water that spills on the floor as soon as it happens.  Remove soap buildup in the tub or shower regularly.  Attach bath mats securely with double-sided non-slip rug tape.  Do not have throw rugs and other things on the floor that can make you trip. What can I do in the bedroom?  Use night lights.  Make sure that you have a light by your bed that is easy to reach.  Do not use any sheets or blankets that are too big for your bed. They should not hang down onto the floor.  Have a firm chair that has side arms. You can use this for support while you get dressed.  Do not have throw rugs and other things on the floor that can make you trip. What can I do in the kitchen?  Clean up any spills right away.  Avoid walking on wet floors.  Keep items that you use a lot in easy-to-reach  places.  If you need to reach something above you, use a strong step stool that has a grab bar.  Keep electrical cords out of the way.  Do not use floor polish or wax that makes floors slippery. If you must use wax, use non-skid floor wax.  Do not have throw rugs and other things on the floor that can make you trip. What can I do with my stairs?  Do not leave any items on the stairs.  Make sure that there are handrails on both sides of the stairs and use them. Fix handrails that are broken or loose. Make sure that handrails are as long as the stairways.  Check any carpeting to make sure that it is firmly attached to the stairs. Fix any carpet that is loose or worn.  Avoid having throw rugs at the top or bottom of the stairs. If you do have throw rugs, attach them to the floor with carpet tape.  Make sure that you have a light switch at the top of the stairs and the bottom of the stairs. If you do not  have them, ask someone to add them for you. What else can I do to help prevent falls?  Wear shoes that:  Do not have high heels.  Have rubber bottoms.  Are comfortable and fit you well.  Are closed at the toe. Do not wear sandals.  If you use a stepladder:  Make sure that it is fully opened. Do not climb a closed stepladder.  Make sure that both sides of the stepladder are locked into place.  Ask someone to hold it for you, if possible.  Clearly mark and make sure that you can see:  Any grab bars or handrails.  First and last steps.  Where the edge of each step is.  Use tools that help you move around (mobility aids) if they are needed. These include:  Canes.  Walkers.  Scooters.  Crutches.  Turn on the lights when you go into a dark area. Replace any light bulbs as soon as they burn out.  Set up your furniture so you have a clear path. Avoid moving your furniture around.  If any of your floors are uneven, fix them.  If there are any pets around you, be aware of where they are.  Review your medicines with your doctor. Some medicines can make you feel dizzy. This can increase your chance of falling. Ask your doctor what other things that you can do to help prevent falls. This information is not intended to replace advice given to you by your health care provider. Make sure you discuss any questions you have with your health care provider. Document Released: 11/23/2008 Document Revised: 07/05/2015 Document Reviewed: 03/03/2014 Elsevier Interactive Patient Education  2017 [employer].

## 2019-03-15 ENCOUNTER — Telehealth: Payer: Self-pay | Admitting: Family Medicine

## 2019-03-15 NOTE — Telephone Encounter (Signed)
Yes, it should be ok.  Can schedule an office visit if she needs more help with her knee pain

## 2019-03-15 NOTE — Telephone Encounter (Signed)
Please advise 

## 2019-03-15 NOTE — Telephone Encounter (Signed)
Patient called in this morning wanting to know if it would be okay to take instaflex along with her other medications, she wants to try it for her joint pain but does not know if it will interferes with her prescribed medications.

## 2019-03-16 NOTE — Telephone Encounter (Signed)
Called number busy

## 2019-03-17 NOTE — Telephone Encounter (Signed)
Called gave information to patient. Will call if no better for app.

## 2019-05-04 ENCOUNTER — Other Ambulatory Visit: Payer: Medicare Other

## 2019-05-04 ENCOUNTER — Ambulatory Visit: Payer: Medicare Other

## 2019-08-05 ENCOUNTER — Telehealth: Payer: Self-pay | Admitting: Family Medicine

## 2019-08-05 ENCOUNTER — Other Ambulatory Visit: Payer: Self-pay

## 2019-08-05 DIAGNOSIS — Z1231 Encounter for screening mammogram for malignant neoplasm of breast: Secondary | ICD-10-CM

## 2019-08-05 NOTE — Telephone Encounter (Signed)
Mammogram ordered

## 2019-08-05 NOTE — Telephone Encounter (Signed)
Patient called in and stated she needed an order to get a Mammogram. Please advise.

## 2019-10-28 ENCOUNTER — Other Ambulatory Visit: Payer: Self-pay

## 2019-10-28 ENCOUNTER — Encounter: Payer: Medicare Other | Admitting: Family Medicine

## 2019-10-28 NOTE — Progress Notes (Signed)
Erroneous encounter. Please disregard.  Pt did not keep appt

## 2020-02-07 ENCOUNTER — Other Ambulatory Visit: Payer: Self-pay

## 2020-02-07 ENCOUNTER — Telehealth: Payer: Self-pay

## 2020-02-07 ENCOUNTER — Other Ambulatory Visit: Payer: Self-pay | Admitting: Family Medicine

## 2020-02-07 MED ORDER — AMLODIPINE BESYLATE 10 MG PO TABS: 10.0000 mg | ORAL_TABLET | Freq: Every day | ORAL | 0 refills | Status: DC

## 2020-02-07 MED ORDER — LISINOPRIL-HYDROCHLOROTHIAZIDE 20-12.5 MG PO TABS: 2.0000 | ORAL_TABLET | Freq: Every day | ORAL | 0 refills | Status: DC

## 2020-02-07 MED ORDER — ATORVASTATIN CALCIUM 10 MG PO TABS: 10.0000 mg | ORAL_TABLET | Freq: Every day | ORAL | 0 refills | Status: DC

## 2020-02-07 NOTE — Telephone Encounter (Signed)
MEDICATION: atorvastatin 10 MG, amlodipine 10 MG, lisinopril 12.5 MG  PHARMACY: [facility] [address]  Comments:   **Let patient know to contact pharmacy at the end of the day to make sure medication is ready. **  ** Please notify patient to allow 48-72 hours to process**  **Encourage patient to contact the pharmacy for refills or they can request refills through [facility]**

## 2020-02-07 NOTE — Telephone Encounter (Signed)
Attempted to call pt. Cell phone number in chart is incorrect. Unable to get through to phone line on home number. CMA sent in one month refill for prescription.

## 2020-02-07 NOTE — Telephone Encounter (Signed)
Patient is 6 months overdue for a follow up, needs an appt for further refills

## 2020-02-09 ENCOUNTER — Other Ambulatory Visit: Payer: Self-pay | Admitting: Family Medicine

## 2020-03-10 ENCOUNTER — Other Ambulatory Visit: Payer: Self-pay | Admitting: Family Medicine

## 2020-04-12 ENCOUNTER — Other Ambulatory Visit: Payer: Self-pay | Admitting: Family Medicine

## 2020-04-12 ENCOUNTER — Other Ambulatory Visit: Payer: Self-pay

## 2020-04-12 ENCOUNTER — Telehealth: Payer: Self-pay

## 2020-04-12 MED ORDER — ATORVASTATIN CALCIUM 10 MG PO TABS: 10.0000 mg | ORAL_TABLET | Freq: Every day | ORAL | 0 refills | Status: DC

## 2020-04-12 MED ORDER — AMLODIPINE BESYLATE 10 MG PO TABS: 10.0000 mg | ORAL_TABLET | Freq: Every day | ORAL | 0 refills | Status: DC

## 2020-04-12 MED ORDER — LISINOPRIL-HYDROCHLOROTHIAZIDE 20-12.5 MG PO TABS: 2.0000 | ORAL_TABLET | Freq: Every day | ORAL | 0 refills | Status: DC

## 2020-04-12 NOTE — Telephone Encounter (Signed)
15-day supply sent to pharmacy 

## 2020-04-12 NOTE — Telephone Encounter (Signed)
MEDICATION: amLODipine (NORVASC) 10 MG tablet  atorvastatin (LIPITOR) 10 MG tablet  lisinopril-hydrochlorothiazide (ZESTORETIC) 20-12.5 MG tablet    PHARMACY:  [id number] - [address], [address] Phone:  [phone]  Fax:  [phone]       Comments: Patient has been scheduled for 3/16 at 11:30 with Dr. Jonni Sanger.   **Let patient know to contact pharmacy at the end of the day to make sure medication is ready. **  ** Please notify patient to allow 48-72 hours to process**  **Encourage patient to contact the pharmacy for refills or they can request refills through [facility]**

## 2020-04-25 ENCOUNTER — Ambulatory Visit: Payer: Medicare Other | Admitting: Family Medicine

## 2020-04-25 ENCOUNTER — Telehealth: Payer: Self-pay

## 2020-04-25 NOTE — Telephone Encounter (Signed)
OK to refill

## 2020-04-25 NOTE — Telephone Encounter (Signed)
MEDICATION: amlodipine 10 mg  PHARMACY: [facility] [address]  Comments:   **Let patient know to contact pharmacy at the end of the day to make sure medication is ready. **  ** Please notify patient to allow 48-72 hours to process**  **Encourage patient to contact the pharmacy for refills or they can request refills through [facility]**

## 2020-04-26 ENCOUNTER — Other Ambulatory Visit: Payer: Self-pay | Admitting: Family Medicine

## 2020-04-26 MED ORDER — AMLODIPINE BESYLATE 10 MG PO TABS: 10.0000 mg | ORAL_TABLET | Freq: Every day | ORAL | 0 refills | Status: DC

## 2020-05-09 ENCOUNTER — Other Ambulatory Visit: Payer: Self-pay | Admitting: Family Medicine

## 2020-05-14 ENCOUNTER — Ambulatory Visit (INDEPENDENT_AMBULATORY_CARE_PROVIDER_SITE_OTHER): Payer: Medicare Other | Admitting: Family Medicine

## 2020-05-14 ENCOUNTER — Encounter: Payer: Self-pay | Admitting: Family Medicine

## 2020-05-14 ENCOUNTER — Other Ambulatory Visit: Payer: Self-pay

## 2020-05-14 VITALS — BP 118/78 | HR 88 | Temp 98.6°F | Wt 163.6 lb

## 2020-05-14 DIAGNOSIS — Z1231 Encounter for screening mammogram for malignant neoplasm of breast: Secondary | ICD-10-CM | POA: Diagnosis not present

## 2020-05-14 DIAGNOSIS — E782 Mixed hyperlipidemia: Secondary | ICD-10-CM | POA: Diagnosis not present

## 2020-05-14 DIAGNOSIS — F17219 Nicotine dependence, cigarettes, with unspecified nicotine-induced disorders: Secondary | ICD-10-CM | POA: Diagnosis not present

## 2020-05-14 DIAGNOSIS — Z780 Asymptomatic menopausal state: Secondary | ICD-10-CM | POA: Diagnosis not present

## 2020-05-14 DIAGNOSIS — B182 Chronic viral hepatitis C: Secondary | ICD-10-CM

## 2020-05-14 DIAGNOSIS — J410 Simple chronic bronchitis: Secondary | ICD-10-CM

## 2020-05-14 DIAGNOSIS — M85852 Other specified disorders of bone density and structure, left thigh: Secondary | ICD-10-CM | POA: Diagnosis not present

## 2020-05-14 DIAGNOSIS — Z0000 Encounter for general adult medical examination without abnormal findings: Secondary | ICD-10-CM | POA: Diagnosis not present

## 2020-05-14 DIAGNOSIS — I10 Essential (primary) hypertension: Secondary | ICD-10-CM | POA: Diagnosis not present

## 2020-05-14 NOTE — Patient Instructions (Signed)
Please return in 6 months for hypertension follow up.  I have ordered a mammogram and/or bone density for you as we discussed today: [x]   Mammogram  [x]   Bone Density  Please call the office checked below to schedule your appointment:  [x]   [facility]      [place], [place]         []   [facility]  [address] [place], [place]    If you have any questions or concerns, please don't hesitate to send me a message via MyChart or call the office at [phone]. Thank you for visiting with [place] today! It's our pleasure caring for you.  I will refill your medications once your lab results return tomorrow.    Managing the Challenge of Quitting Smoking Quitting smoking is a physical and mental challenge. You will face cravings, withdrawal symptoms, and temptation. Before quitting, work with your health care provider to make a plan that can help you manage quitting. Preparation can help you quit and keep you from giving in. How to manage lifestyle changes Managing stress Stress can make you want to smoke, and wanting to smoke may cause stress. It is important to find ways to manage your stress. You might try some of the following:  Practice relaxation techniques. ? Breathe slowly and deeply, in through your nose and out through your mouth. ? Listen to music. ? Soak in a bath or take a shower. ? Imagine a peaceful place or vacation.  Get some support. ? Talk with family or friends about your stress. ? Join a support group. ? Talk with a counselor or therapist.  Get some physical activity. ? Go for a walk, run, or bike ride. ? Play a favorite sport. ? Practice yoga.   Medicines Talk with your health care provider about medicines that might help you deal with cravings and make quitting easier for you. Relationships Social situations can be difficult when you are quitting smoking. To  manage this, you can:  Avoid parties and other social situations where people might be smoking.  Avoid alcohol.  Leave right away if you have the urge to smoke.  Explain to your family and friends that you are quitting smoking. Ask for support and let them know you might be a bit grumpy.  Plan activities where smoking is not an option. General instructions Be aware that many people gain weight after they quit smoking. However, not everyone does. To keep from gaining weight, have a plan in place before you quit and stick to the plan after you quit. Your plan should include:  Having healthy snacks. When you have a craving, it may help to: ? Eat popcorn, carrots, celery, or other cut vegetables. ? Chew sugar-free gum.  Changing how you eat. ? Eat small portion sizes at meals. ? Eat 4-6 small meals throughout the day instead of 1-2 large meals a day. ? Be mindful when you eat. Do not watch television or do other things that might distract you as you eat.  Exercising regularly. ? Make time to exercise each day. If you do not have time for a long workout, do short bouts of exercise for 5-10 minutes several times a day. ? Do some form of strengthening exercise, such as weight lifting. ? Do some exercise that gets your heart beating and causes you  to breathe deeply, such as walking fast, running, swimming, or biking. This is very important.  Drinking plenty of water or other low-calorie or no-calorie drinks. Drink 6-8 glasses of water daily.   How to recognize withdrawal symptoms Your body and mind may experience discomfort as you try to get used to not having nicotine in your system. These effects are called withdrawal symptoms. They may include:  Feeling hungrier than normal.  Having trouble concentrating.  Feeling irritable or restless.  Having trouble sleeping.  Feeling depressed.  Craving a cigarette. To manage withdrawal symptoms:  Avoid places, people, and activities that  trigger your cravings.  Remember why you want to quit.  Get plenty of sleep.  Avoid coffee and other caffeinated drinks. These may worsen some of your symptoms. These symptoms may surprise you. But be assured that they are normal to have when quitting smoking. How to manage cravings Come up with a plan for how to deal with your cravings. The plan should include the following:  A definition of the specific situation you want to deal with.  An alternative action you will take.  A clear idea for how this action will help.  The name of someone who might help you with this. Cravings usually last for 5-10 minutes. Consider taking the following actions to help you with your plan to deal with cravings:  Keep your mouth busy. ? Chew sugar-free gum. ? Suck on hard candies or a straw. ? Brush your teeth.  Keep your hands and body busy. ? Change to a different activity right away. ? Squeeze or play with a ball. ? Do an activity or a hobby, such as making bead jewelry, practicing needlepoint, or working with wood. ? Mix up your normal routine. ? Take a short exercise break. Go for a quick walk or run up and down stairs.  Focus on doing something kind or helpful for someone else.  Call a friend or family member to talk during a craving.  Join a support group.  Contact a quitline. Where to find support To get help or find a support group:  Call the [facility]'s Smoking Quitline: 1-800-QUIT NOW (267) 412-0159)  Visit the website of the Substance Abuse and [place]: ktimeonline.com  Text QUIT to SmokefreeTXT: 149702 Where to find more information Visit these websites to find more information on quitting smoking:  [place]: www.smokefree.gov  American Lung Association: www.lung.org  American Cancer Society: www.cancer.org  Centers for Disease Control and Prevention: http://www.wolf.info/  American Heart Association:  www.heart.org Contact a health care provider if:  You want to change your plan for quitting.  The medicines you are taking are not helping.  Your eating feels out of control or you cannot sleep. Get help right away if:  You feel depressed or become very anxious. Summary  Quitting smoking is a physical and mental challenge. You will face cravings, withdrawal symptoms, and temptation to smoke again. Preparation can help you as you go through these challenges.  Try different techniques to manage stress, handle social situations, and prevent weight gain.  You can deal with cravings by keeping your mouth busy (such as by chewing gum), keeping your hands and body busy, calling family or friends, or contacting a quitline for people who want to quit smoking.  You can deal with withdrawal symptoms by avoiding places where people smoke, getting plenty of rest, and avoiding drinks with caffeine. This information is not intended to replace advice given to you  by your health care provider. Make sure you discuss any questions you have with your health care provider. Document Revised: 11/16/2018 Document Reviewed: 11/16/2018 Elsevier Patient Education  Agency.

## 2020-05-14 NOTE — Progress Notes (Signed)
Subjective  Chief Complaint  Patient presents with  . Hyperlipidemia  . Hypertension    HPI: Christine Hurley is a 70 y.o. female who presents to [facility] today for a Female Wellness Visit. She also has the concerns and/or needs as listed above in the chief complaint. These will be addressed in addition to the Health Maintenance Visit.   Wellness Visit: annual visit with health maintenance review and exam without Pap   Health maintenance: 70 year old chronic smoker here for physical and follow-up chronic medical problems.  She reports doing well.  She does admit that she was trying to wean off her blood pressure medicines.  However she did note an elevated blood pressure with some occipital headache symptoms a few weeks ago.  She restarted her medicines and is feeling better.  She knows she should eat a low-salt diet.  She has no chest pain or shortness of breath.  She is overdue for mammogram, bone density.  Colorectal cancer screens are up-to-date.  Immunizations are up-to-date and she is eligible for Covid booster vaccination. Chronic disease f/u and/or acute problem visit: (deemed necessary to be done in addition to the wellness visit):  Hypertension: As noted above had an elevated reading at home.  Overdue for follow-up.  Today in the office her blood pressure is doing well on her current medications.  She denies chest pain or shortness of breath.  She is overdue for blood work.  She is nonfasting today.  Hyperlipidemia on statin.  Tolerates well.  Reports she is taking it.  Goal LDL is less than 100  Chronic smoker: Smoking about a third a pack per day.  Has known COPD with daily cough and intermittent wheezing but denies shortness of breath or dyspnea on exertion.  She is precontemplative about quitting.  He has had counseling about this in the past.  She continues to think about it.  Osteopenia: Overdue for bone density follow-up.  She continues to be  active.  History of hepatitis C.  No liver failure symptoms including no jaundice, right upper quadrant pain or edema.   Assessment  1. Annual physical exam   2. Essential hypertension   3. Mixed hyperlipidemia   4. Cigarette nicotine dependence with nicotine-induced disorder   5. Simple chronic bronchitis ([place])   6. Osteopenia of neck of left femur   7. Chronic hepatitis C without hepatic coma (HCC) Chronic  8. Encounter for screening mammogram for breast cancer   9. Asymptomatic menopausal state      Plan  Female Wellness Visit:  Age appropriate Health Maintenance and Prevention measures were discussed with patient. Included topics are cancer screening recommendations, ways to keep healthy (see AVS) including dietary and exercise recommendations, regular eye and dental care, use of seat belts, and avoidance of moderate alcohol use and tobacco use.  Education given about need for mammogram for breast cancer screening.  Patient reports that she will get scheduled.  Information given and after visit summary.  BMI: discussed patient's BMI and encouraged positive lifestyle modifications to help get to or maintain a target BMI.  HM needs and immunizations were addressed and ordered. See below for orders. See HM and immunization section for updates.  Routine labs and screening tests ordered including cmp, cbc and lipids where appropriate.  Discussed recommendations regarding Vit D and calcium supplementation (see AVS)  Chronic disease management visit and/or acute problem visit:  Osteopenia: Recommend bone density for follow-up.  Continue vitamin D supplementation and  calcium.  Continue reactive.  Hypertension: Now well controlled on amlodipine 10 mg daily and maximum Zestoretic dosing.  Zestoretic 20/12.52 tabs daily.    Check renal function electrolytes.  Hyperlipidemia on statin: Recheck nonfasting lipids today with liver test.  Lipitor 10 mg nightly  History of treated chronic  hep C.  Clinically stable.  Chronic smoker with COPD: Educated on risks and benefits.  Patient defers lung cancer screening.  We will continue to educate.  We will continue to work on smoking cessation.  See after visit summary.  Counseled on it.   Follow up: 6 months for blood pressure follow-up Orders Placed This Encounter  Procedures  . DG Bone Density  . MM DIGITAL SCREENING BILATERAL  . CBC with Differential/Platelet  . Comprehensive metabolic panel  . Lipid panel   No orders of the defined types were placed in this encounter.     Body mass index is 28.08 kg/m. Wt Readings from Last 3 Encounters:  05/14/20 163 lb 9.6 oz (74.2 kg)  01/17/19 161 lb (73 kg)  07/27/18 161 lb 3.2 oz (73.1 kg)     Patient Active Problem List   Diagnosis Date Noted  . Mixed hyperlipidemia 01/17/2019    Priority: High  . Cigarette nicotine dependence with nicotine-induced disorder 11/03/2016    Priority: High  . Essential hypertension 05/26/2013    Priority: High  . Chronic hepatitis C without hepatic coma ([place]) 11/04/2006    Priority: High    Managed by Dr. Arta Silence; no effects on liver; completed treatment 2019   . Marijuana use, episodic 06/18/2017    Priority: Medium    Reports uses it for her leg pain   . Osteopenia of neck of femur 05/22/2016    Priority: Medium    dexa 04/2016: T = - 1.4 lowest at left femoral neck; nl at lumbar spine.    . Abnormal thallium stress test 03/07/2015    Priority: Medium    Small, partially reversible inferolateral ischemia. Marked hypertensive response to exercise. Low risk.     . Chronic obstructive pulmonary disease ([place]) 07/01/2013    Priority: Medium    PFTs 2015: normal flow volumes with mild obstructive disease by shape of curves and no response to bronchodilator.     Health Maintenance  Topic Date Due  . MAMMOGRAM  01/22/2017  . DEXA SCAN  05/02/2019  . COVID-19 Vaccine (3 - Booster for Pfizer series) 04/09/2020  . INFLUENZA  VACCINE  09/10/2020  . COLONOSCOPY (Pts 45-4yrs Insurance coverage will need to be confirmed)  07/24/2024  . Hepatitis C Screening  Completed  . PNA vac Low Risk Adult  Completed  . HPV VACCINES  Aged Out  . TETANUS/TDAP  Discontinued   Immunization History  Administered Date(s) Administered  . Influenza Split 04/24/2013  . Influenza, High Dose Seasonal PF 09/14/2016, 12/18/2017, 10/11/2018  . Influenza, Seasonal, Injecte, Preservative Fre 11/24/2015  . Influenza,inj,Quad PF,6+ Mos 11/24/2015  . Influenza,inj,quad, With Preservative 03/21/2013  . PFIZER(Purple Top)SARS-COV-2 Vaccination 09/10/2019, 10/11/2019  . PPD Test 04/02/2015  . Pneumococcal Conjugate-13 06/24/2017  . Pneumococcal Polysaccharide-23 07/03/2014, 11/24/2015  . Pneumococcal-Unspecified 07/03/2014, 11/24/2015  . Td 08/10/2008  . Zoster Recombinat (Shingrix) 08/10/2018, 08/27/2018, 10/11/2018   We updated and reviewed the patient's past history in detail and it is documented below. Allergies: Patient has No Known Allergies. Past Medical History Patient  has a past medical history of Abnormal nuclear stress test (February '13), Arthritis, Chest pain with minimal risk for cardiac etiology (February  2013), COPD (chronic obstructive pulmonary disease) ([place]), Depression, Hepatitis C, Hypertension, Osteopenia of spine (05/22/2016), and Right tibial fracture ( ). Past Surgical History Patient  has a past surgical history that includes Tibia fracture surgery (Right, 09/2008); doppler echocardiography (03/26/2011); and NM MYOVIEW LTD (03/27/11). Family History: Patient family history includes Asthma in her mother; Cancer in her mother; Cancer - Prostate in her father. Social History:  Patient  reports that she has been smoking cigarettes. She has a 45.00 pack-year smoking history. She has never used smokeless tobacco. She reports current alcohol use of about 2.0 standard drinks of alcohol per week. She reports current drug use.  Drug: Marijuana.  Review of Systems: Constitutional: negative for fever or malaise Ophthalmic: negative for photophobia, double vision or loss of vision Cardiovascular: negative for chest pain, dyspnea on exertion, or new LE swelling Respiratory: negative for SOB or persistent cough Gastrointestinal: negative for abdominal pain, change in bowel habits or melena Genitourinary: negative for dysuria or gross hematuria, no abnormal uterine bleeding or disharge Musculoskeletal: negative for new gait disturbance or muscular weakness Integumentary: negative for new or persistent rashes, no breast lumps Neurological: negative for TIA or stroke symptoms Psychiatric: negative for SI or delusions Allergic/Immunologic: negative for hives  Patient Care Team    Relationship Specialty Notifications Start End  Leamon Arnt, MD PCP - General Family Medicine  06/18/17   Arta Silence, MD Consulting Physician Gastroenterology  01/20/18   Gevena Cotton, MD Consulting Physician Ophthalmology  01/20/19   Harriett Sine, MD Consulting Physician Dermatology  01/20/19     Objective  Vitals: BP 118/78   Pulse 88   Temp 98.6 F (37 C) (Temporal)   Wt 163 lb 9.6 oz (74.2 kg)   SpO2 96%   BMI 28.08 kg/m  General:  Well developed, well nourished, no acute distress  Psych:  Alert and orientedx3,normal mood and affect HEENT:  Normocephalic, atraumatic, non-icteric sclera,  supple neck without adenopathy, mass or thyromegaly Cardiovascular:  Normal S1, S2, RRR without gallop, rub or murmur Respiratory:  Good breath sounds bilaterally, CTAB with normal respiratory effort Gastrointestinal: normal bowel sounds, soft, non-tender, no noted masses. No HSM MSK: no deformities, contusions. Joints are without erythema or swelling.  Skin:  Warm, no rashes or suspicious lesions noted Neurologic:    Mental status is normal. CN 2-11 are normal. Gross motor and sensory exams are normal. Normal gait. No  tremor    Commons side effects, risks, benefits, and alternatives for medications and treatment plan prescribed today were discussed, and the patient expressed understanding of the given instructions. Patient is instructed to call or message via MyChart if he/she has any questions or concerns regarding our treatment plan. No barriers to understanding were identified. We discussed Red Flag symptoms and signs in detail. Patient expressed understanding regarding what to do in case of urgent or emergency type symptoms.   Medication list was reconciled, printed and provided to the patient in AVS. Patient instructions and summary information was reviewed with the patient as documented in the AVS. This note was prepared with assistance of Dragon voice recognition software. Occasional wrong-word or sound-a-like substitutions may have occurred due to the inherent limitations of voice recognition software  This visit occurred during the SARS-CoV-2 public health emergency.  Safety protocols were in place, including screening questions prior to the visit, additional usage of staff PPE, and extensive cleaning of exam room while observing appropriate contact time as indicated for disinfecting solutions.

## 2020-05-15 ENCOUNTER — Other Ambulatory Visit: Payer: Self-pay | Admitting: Family Medicine

## 2020-05-15 LAB — COMPREHENSIVE METABOLIC PANEL
ALT: 16 U/L (ref 0–35)
AST: 18 U/L (ref 0–37)
Albumin: 4.3 g/dL (ref 3.5–5.2)
Alkaline Phosphatase: 56 U/L (ref 39–117)
BUN: 17 mg/dL (ref 6–23)
CO2: 25 mEq/L (ref 19–32)
Calcium: 9.7 mg/dL (ref 8.4–10.5)
Chloride: 103 mEq/L (ref 96–112)
Creatinine, Ser: 0.87 mg/dL (ref 0.40–1.20)
GFR: 67.65 mL/min (ref >60.00)
Glucose, Bld: 99 mg/dL (ref 70–99)
Potassium: 4.1 mEq/L (ref 3.5–5.1)
Sodium: 138 mEq/L (ref 135–145)
Total Bilirubin: 0.3 mg/dL (ref 0.2–1.2)
Total Protein: 7.5 g/dL (ref 6.0–8.3)

## 2020-05-15 LAB — CBC WITH DIFFERENTIAL/PLATELET
Basophils Absolute: 0 10*3/uL (ref 0.0–0.1)
Basophils Relative: 0.6 % (ref 0.0–3.0)
Eosinophils Absolute: 0.1 10*3/uL (ref 0.0–0.7)
Eosinophils Relative: 1.7 % (ref 0.0–5.0)
HCT: 37.9 % (ref 36.0–46.0)
Hemoglobin: 12.9 g/dL (ref 12.0–15.0)
Lymphocytes Relative: 23.7 % (ref 12.0–46.0)
Lymphs Abs: 1.5 10*3/uL (ref 0.7–4.0)
MCHC: 34 g/dL (ref 30.0–36.0)
MCV: 90.7 fl (ref 78.0–100.0)
Monocytes Absolute: 0.3 10*3/uL (ref 0.1–1.0)
Monocytes Relative: 4.6 % (ref 3.0–12.0)
Neutro Abs: 4.4 10*3/uL (ref 1.4–7.7)
Neutrophils Relative %: 69.4 % (ref 43.0–77.0)
Platelets: 287 10*3/uL (ref 150.0–400.0)
RBC: 4.18 Mil/uL (ref 3.87–5.11)
RDW: 13.3 % (ref 11.5–15.5)
WBC: 6.3 10*3/uL (ref 4.0–10.5)

## 2020-05-15 LAB — LIPID PANEL
Cholesterol: 154 mg/dL (ref 0–200)
HDL: 67 mg/dL (ref >39.00)
LDL Cholesterol: 69 mg/dL (ref 0–99)
NonHDL: 86.87
Total CHOL/HDL Ratio: 2
Triglycerides: 87 mg/dL (ref 0.0–149.0)
VLDL: 17.4 mg/dL (ref 0.0–40.0)

## 2020-05-15 MED ORDER — ATORVASTATIN CALCIUM 10 MG PO TABS: 10.0000 mg | ORAL_TABLET | Freq: Every evening | ORAL | 1 refills | Status: DC

## 2020-05-15 MED ORDER — LISINOPRIL-HYDROCHLOROTHIAZIDE 20-12.5 MG PO TABS: 2.0000 | ORAL_TABLET | Freq: Every day | ORAL | 1 refills | Status: DC

## 2020-05-15 MED ORDER — AMLODIPINE BESYLATE 10 MG PO TABS: 10.0000 mg | ORAL_TABLET | Freq: Every day | ORAL | 1 refills | Status: DC

## 2020-05-15 NOTE — Progress Notes (Signed)
Please call patient: I have reviewed his/her lab results. All lab results look good.  Continue the same medications. I have refilled them for the next 75months. Please see me in 6 months for blood pressure check.  Thanks!  The 10-year ASCVD risk score Christine Hurley Christine Hurley., et al., 2013) is: 15.9% ON LIPITOR 10   Values used to calculate the score:     Age: 70 years     Sex: Female     Is Non-Hispanic African American: Yes     Diabetic: No     Tobacco smoker: Yes     Systolic Blood Pressure: 195 mmHg     Is BP treated: Yes     HDL Cholesterol: 67 mg/dL     Total Cholesterol: 154 mg/dL

## 2020-05-30 ENCOUNTER — Telehealth: Payer: Self-pay | Admitting: Family Medicine

## 2020-05-30 NOTE — Chronic Care Management (AMB) (Signed)
  Chronic Care Management   Note  05/30/2020 Name: Christine Hurley MRN: [id number] DOB: 29-Mar-1950  Christine Hurley is a 70 y.o. year old female who is a primary care patient of Leamon Arnt, MD. I reached out to Kathe Becton by phone today in response to a referral sent by [name]'s PCP, Leamon Arnt, MD.   [name] was given information about Chronic Care Management services today including:  1. CCM service includes personalized support from designated clinical staff supervised by her physician, including individualized plan of care and coordination with other care providers 2. 24/7 contact phone numbers for assistance for urgent and routine care needs. 3. Service will only be billed when office clinical staff spend 20 minutes or more in a month to coordinate care. 4. Only one practitioner may furnish and bill the service in a calendar month. 5. The patient may stop CCM services at any time (effective at the end of the month) by phone call to the office staff.   Patient agreed to services and verbal consent obtained.   Follow up plan:   Lauretta Grill [facility] Scheduler

## 2020-06-04 ENCOUNTER — Other Ambulatory Visit: Payer: Self-pay | Admitting: Family Medicine

## 2020-06-04 DIAGNOSIS — Z0000 Encounter for general adult medical examination without abnormal findings: Secondary | ICD-10-CM

## 2020-06-04 DIAGNOSIS — Z780 Asymptomatic menopausal state: Secondary | ICD-10-CM

## 2020-06-04 DIAGNOSIS — Z1231 Encounter for screening mammogram for malignant neoplasm of breast: Secondary | ICD-10-CM

## 2020-06-15 ENCOUNTER — Telehealth: Payer: Self-pay

## 2020-06-15 NOTE — Chronic Care Management (AMB) (Signed)
    Chronic Care Management Pharmacy Assistant   Name: Christine Hurley  MRN: [id number] DOB: 1950/04/30  Christine Hurley is an 70 y.o. year old female who presents for his initial CCM visit with the clinical pharmacist.  Reason for Encounter: Chart Prep/ IQ   Recent office visits:  05/14/20- Billey Chang, MD-chronic conditions addressed;mammogram, bone density, and labs ordered, follow up 6 months for htn  Recent consult visits:  No visits noted  Hospital visits:  None in previous 6 months  Medications: Outpatient Encounter Medications as of 06/15/2020  Medication Sig  . albuterol (PROAIR HFA) 108 (90 Base) MCG/ACT inhaler Inhale into the lungs every 6 (six) hours as needed for wheezing or shortness of breath.   [name] amLODipine (NORVASC) 10 MG tablet Take 1 tablet (10 mg total) by mouth daily.  [name] atorvastatin (LIPITOR) 10 MG tablet Take 1 tablet (10 mg total) by mouth at bedtime.  [name] lisinopril-hydrochlorothiazide (ZESTORETIC) 20-12.5 MG tablet Take 2 tablets by mouth daily.   No facility-administered encounter medications on file as of 06/15/2020.   Current Documented Medications Albuterol 108 mcg/act-Uses PRN. Using expired inhalers new Rx needed  Amlodipine 10 mg- 90 DS last filled 04/26/20 Atorvastatin 10 mg- 90 DS last filled 05/15/20 Lisinopril- Hydrochlorothiazide 20-12.5 mg- 90 DS last filled 05/15/20  Have you seen any other providers since your last visit? Patient stated she hasn't seen any providers  Any changes in your medications or health?  Patient reported no changes   Any side effects from any medications?  Patient reported no side effects  Do you have an symptoms or problems not managed by your medications?  Patient stated there are no unmanaged problems   Any concerns about your health right now?  Patient stated that she has been experiencing double vision and dizziness. She noted that she is 3 yrs overdue for an eye exam and believes it could be  related.  Has your provider asked that you check blood pressure, blood sugar, or follow special diet at home?  Patient was asked to use her home arm cuff BP monitor to keep track of BP and does so regularly. She was also asked to exclude pork from her diet.   Do you get any type of exercise on a regular basis?  Patient stated she recently went back to walking 30 minutes daily every other day  Can you think of a goal you would like to reach for your health?  Patient did not have any goals to report   Do you have any problems getting your medications?  Patient stated there were currently no problems with [facility]  Is there anything that you would like to discuss during the appointment? Patient would like to discuss new inhaler rx and eye care.  Please bring medications and supplements to appointment Reminded patient of initial phone visit with CPP on 05/10 at 9 am   [place], [place]

## 2020-06-19 ENCOUNTER — Ambulatory Visit (INDEPENDENT_AMBULATORY_CARE_PROVIDER_SITE_OTHER): Payer: Medicare Other

## 2020-06-19 DIAGNOSIS — E782 Mixed hyperlipidemia: Secondary | ICD-10-CM

## 2020-06-19 DIAGNOSIS — I10 Essential (primary) hypertension: Secondary | ICD-10-CM

## 2020-06-19 DIAGNOSIS — M85852 Other specified disorders of bone density and structure, left thigh: Secondary | ICD-10-CM

## 2020-06-19 DIAGNOSIS — J410 Simple chronic bronchitis: Secondary | ICD-10-CM | POA: Diagnosis not present

## 2020-06-19 DIAGNOSIS — F17219 Nicotine dependence, cigarettes, with unspecified nicotine-induced disorders: Secondary | ICD-10-CM

## 2020-06-19 MED ORDER — ALBUTEROL SULFATE HFA 108 (90 BASE) MCG/ACT IN AERS: 1.0000 | INHALATION_SPRAY | Freq: Four times a day (QID) | RESPIRATORY_TRACT | 0 refills | Status: DC | PRN

## 2020-06-19 NOTE — Progress Notes (Signed)
Chronic Care Management Pharmacy Note  06/19/2020 Name:  Christine Hurley MRN:  [id number] DOB:  [birth year]  Subjective: Christine Hurley is an 70 y.o. year old female who is a primary patient of Leamon Arnt, MD.  The CCM team was consulted for assistance with disease management and care coordination needs.    Engaged with patient by telephone for initial visit in response to provider referral for pharmacy case management and/or care coordination services.   Consent to Services:  The patient was given the following information about Chronic Care Management services today, agreed to services, and gave verbal consent: 1. CCM service includes personalized support from designated clinical staff supervised by the primary care provider, including individualized plan of care and coordination with other care providers 2. 24/7 contact phone numbers for assistance for urgent and routine care needs. 3. Service will only be billed when office clinical staff spend 20 minutes or more in a month to coordinate care. 4. Only one practitioner may furnish and bill the service in a calendar month. 5.The patient may stop CCM services at any time (effective at the end of the month) by phone call to the office staff. 6. The patient will be responsible for cost sharing (co-pay) of up to 20% of the service fee (after annual deductible is met). Patient agreed to services and consent obtained.  Patient Care Team: Leamon Arnt, MD as PCP - General (Family Medicine) Arta Silence, MD as Consulting Physician (Gastroenterology) Gevena Cotton, MD as Consulting Physician (Ophthalmology) Harriett Sine, MD as Consulting Physician (Dermatology) Madelin Rear, [facility] as Pharmacist (Pharmacist)  Recent office visits:  05/14/20- Billey Chang, MD-chronic conditions addressed;mammogram, bone density, and labs ordered, follow up 6 months for htn  Recent consult visits:  No visits noted  Hospital visits:  None in  previous 6 months Objective:  Lab Results  Component Value Date   CREATININE 0.87 05/14/2020   CREATININE 0.74 01/17/2019   CREATININE 0.73 12/18/2017    Lab Results  Component Value Date   HGBA1C 5.7 06/18/2017   Last diabetic Eye exam: No results found for: HMDIABEYEEXA  Last diabetic Foot exam: No results found for: HMDIABFOOTEX      Component Value Date/Time   CHOL 154 05/14/2020 1452   TRIG 87.0 05/14/2020 1452   HDL 67.00 05/14/2020 1452   CHOLHDL 2 05/14/2020 1452   VLDL 17.4 05/14/2020 1452   LDLCALC 69 05/14/2020 1452   LDLDIRECT 72.0 01/17/2019 1020    Hepatic Function Latest Ref Rng & Units 05/14/2020 01/17/2019 12/18/2017  Total Protein 6.0 - 8.3 g/dL 7.5 7.6 7.7  Albumin 3.5 - 5.2 g/dL 4.3 4.4 -  AST 0 - 37 U/L 18 15 17   ALT 0 - 35 U/L 16 11 11   Alk Phosphatase 39 - 117 U/L 56 66 -  Total Bilirubin 0.2 - 1.2 mg/dL 0.3 0.4 0.4  Bilirubin, Direct 0.1 - 0.5 mg/dL - - -   Lab Results  Component Value Date/Time   TSH 1.48 01/17/2019 10:20 AM   TSH 0.89 12/18/2017 03:25 PM   FREET4 0.89 11/23/2015 12:14 PM    CBC Latest Ref Rng & Units 05/14/2020 01/17/2019 12/18/2017  WBC 4.0 - 10.5 K/uL 6.3 4.5 4.9  Hemoglobin 12.0 - 15.0 g/dL 12.9 13.0 12.4  Hematocrit 36.0 - 46.0 % 37.9 38.0 36.6  Platelets 150.0 - 400.0 K/uL 287.0 309.0 256    Lab Results  Component Value Date/Time   VD25OH 16.86 (L) 01/17/2019 10:20 AM   CAT ASSESSMENT  06/19/20 Rank each of the following items on a scale of 0 to 5 (with 5 being most severe) Write a # 0-5 in each box  I never cough (0) > I cough all the time (5) 3  I have no phlegm (mucus) in my chest (0) > My chest is completely full of phlegm (mucus) (5) 0  My chest does not feel tight at all (0) > My chest feels very tight (5) 0  When I walk up a hill or one flight of stairs I am not breathless (0) > When I walk up a hill or one flight of stairs I am very breathless (5) 2  I am not limited doing any activities at home (0) > I am  very limited doing activities at home (5) 2  I am confident leaving my home despite my lung function (0) > I am not at all confident leaving my home because of my lung condition (5)  1  I sleep soundly (0) > I don't sleep soundly because of my lung condition (5) 2  I have lots of energy (0) > I have no energy at all (5) 2  Total CAT Score: 12  Clinical ASCVD:   The 10-year ASCVD risk score Mikey Bussing DC Jr., et al., 2013) is: 15.9%   Values used to calculate the score:     Age: 23 years     Sex: Female     Is Non-Hispanic African American: Yes     Diabetic: No     Tobacco smoker: Yes     Systolic Blood Pressure: 884 mmHg     Is BP treated: Yes     HDL Cholesterol: 67 mg/dL     Total Cholesterol: 154 mg/dL    Social History   Tobacco Use  Smoking Status Current Every Day Smoker  . Packs/day: 1.00  . Years: 45.00  . Pack years: 45.00  . Types: Cigarettes  Smokeless Tobacco Never Used  Tobacco Comment   2 cigs per day    BP Readings from Last 3 Encounters:  05/14/20 118/78  01/17/19 122/74  07/27/18 130/84   Pulse Readings from Last 3 Encounters:  05/14/20 88  01/17/19 76  07/27/18 79   Wt Readings from Last 3 Encounters:  05/14/20 163 lb 9.6 oz (74.2 kg)  01/17/19 161 lb (73 kg)  07/27/18 161 lb 3.2 oz (73.1 kg)   Assessment: Review of patient past medical history, allergies, medications, health status, including review of consultants reports, laboratory and other test data, was performed as part of comprehensive evaluation and provision of chronic care management services.   SDOH:  (Social Determinants of Health) assessments and interventions performed: Yes   CCM Care Plan  No Known Allergies  Medications Reviewed Today    Reviewed by Madelin Rear, [facility] (Pharmacist) on 06/19/20 at [place] List Status: <None>  Medication Order Taking? Sig Documenting Provider Last Dose Status Informant  albuterol (PROAIR HFA) 108 (90 Base) MCG/ACT inhaler [id number]  Inhale into the  lungs every 6 (six) hours as needed for wheezing or shortness of breath.  [provider]  Active   amLODipine (NORVASC) 10 MG tablet [id number] Yes Take 1 tablet (10 mg total) by mouth daily. Leamon Arnt, MD  Active   atorvastatin (LIPITOR) 10 MG tablet [id number] Yes Take 1 tablet (10 mg total) by mouth at bedtime. Leamon Arnt, MD  Active   lisinopril-hydrochlorothiazide (ZESTORETIC) 20-12.5 MG tablet [id number] Yes Take 2 tablets by mouth daily.  Leamon Arnt, MD  Active           Patient Active Problem List   Diagnosis Date Noted  . Mixed hyperlipidemia 01/17/2019  . Marijuana use, episodic 06/18/2017  . Cigarette nicotine dependence with nicotine-induced disorder 11/03/2016  . Osteopenia of neck of femur 05/22/2016  . Abnormal thallium stress test 03/07/2015  . Chronic obstructive pulmonary disease ([place]) 07/01/2013  . Essential hypertension 05/26/2013  . Chronic hepatitis C without hepatic coma ([place]) 11/04/2006    Immunization History  Administered Date(s) Administered  . Influenza Split 04/24/2013  . Influenza, High Dose Seasonal PF 09/14/2016, 12/18/2017, 10/11/2018  . Influenza, Seasonal, Injecte, Preservative Fre 11/24/2015  . Influenza,inj,Quad PF,6+ Mos 11/24/2015  . Influenza,inj,quad, With Preservative 03/21/2013  . PFIZER(Purple Top)SARS-COV-2 Vaccination 09/10/2019, 10/11/2019  . PPD Test 04/02/2015  . Pneumococcal Conjugate-13 06/24/2017  . Pneumococcal Polysaccharide-23 07/03/2014, 11/24/2015  . Pneumococcal-Unspecified 07/03/2014, 11/24/2015  . Td 08/10/2008  . Zoster Recombinat (Shingrix) 08/10/2018, 08/27/2018, 10/11/2018   Conditions to be addressed/monitored: COPD HLD HTN Nicotine Dependence Osteopenia  Care Plan : [place]  Updates made by Madelin Rear, [facility] since 06/19/2020 12:00 AM    Problem: COPD HLD HTN Nicotine Dependence Osteopenia   Priority: High    Long-Range Goal: Disease Management   Start Date: 06/19/2020   Expected End Date: 06/19/2021  This Visit's Progress: On track  Priority: High  Note:   Current Barriers:  . Working towards reducing number of cigarettes per day currently 0.25 PPD  Pharmacist Clinical Goal(s):  [name] Patient will contact provider office for questions/concerns as evidenced notation of same in electronic health record through collaboration with PharmD and provider.   Interventions: . 1:1 collaboration with Leamon Arnt, MD regarding development and update of comprehensive plan of care as evidenced by provider attestation and co-signature . Inter-disciplinary care team collaboration (see longitudinal plan of care) . Comprehensive medication review performed; medication list updated in electronic medical record . No medication changes, albuterol rescue inhaler refilled  Hypertension (BP goal <130/80) -Controlled -Current treatment: . Amlodipine 10 mg once daily . Lisinopril-HCTZ 20-12.5 mg once daily -Medications previously tried: hydralazine, clonidine patch   -Home readings: at goal 114/72 139/93 138/91. Is walking every other day currently.  -Denies hypotensive/hypertensive symptoms -Educated on Exercise goal of 150 minutes per week; -Recommended to continue current medication  Hyperlipidemia: (LDL goal < 70) -Controlled -Current treatment: . Atorvastatin 10 mg once daily -Reviewed cholesterol goals, denies any side effects or problems. -Recommended to continue current medication  COPD (Goal: control symptoms and prevent exacerbations) -Controlled -Current treatment  . Proair inhaler every 6 hours as needed -Gold Grade: Gold 1 (FEV1>80%) -Current COPD Classification:  A (low sx, <2 exacerbations/yr) -CAT score: 12. -Pulmonary function testing: 2016 -Exacerbations requiring treatment in last 6 months: 0 -Frequency of rescue inhaler use: PRN, denies consistent use -Counseled on When to use rescue inhaler -Recommended to continue current  medication  Tobacco use (Goal stop smoking) -Uncontrolled  -2ppd - > 0.25 PPD. Now drinking lots of juices and water and chews gum to replace cigarettes. No formal previous quit attempts.  -Counseled on replacement therapys, Rx options - declined at this time. Preference is to quit cold [place] -Agreed on plan to review smoking cessation in one month and further assess  Osteopenia (Goal maintain bone density, optimize supplementation) -Controlled -Last DEXA Scan: 2018  -Patient is not a candidate for pharmacologic treatment -Current treatment  . Vitamin D3 1000 unts  -Recommend [phone] units of vitamin  D daily. Recommend weight-bearing and muscle strengthening exercises for building and maintaining bone density.  -Is already scheduled for DEXA repeat  Patient Goals/Self-Care Activities . Patient will:  - target a minimum of 150 minutes of moderate intensity exercise weekly  Medication Assistance: None required.  Patient affirms current coverage meets needs.  Patient's preferred pharmacy is: [facility], [place] - [address] AT [place]: [place] call 1 month to review smoking cessation and possibly establish quit date, appropriate d3 supplementation Follow Up:  Patient agrees to Care Plan and Follow-up.    Future Appointments  Date Time Provider [place]  07/24/2020 10:30 AM [facility] CCM PHARMACIST [facility]  11/12/2020  9:00 AM Leamon Arnt, MD [facility]  11/19/2020  9:30 AM [facility] MM 2 [facility]  11/19/2020 10:00 AM [facility] DX DEXA 1 [facility]   Madelin Rear, PharmD, CPP Clinical Pharmacist Practitioner  [facility]  [phone]

## 2020-06-19 NOTE — Patient Instructions (Signed)
Christine Hurley,  Thank you for talking with me today. I have included our care plan/goals in the following pages.   Please review and call me at [phone] with any questions.  Thanks! Ellin Mayhew, Pharm.D., BCGP Clinical Pharmacist [facility] [place] [phone] There are no care plans to display for this patient.    The patient was given the following information about Chronic Care Management services today, agreed to services, and gave verbal consent: 1. CCM service includes personalized support from designated clinical staff supervised by the primary care provider, including individualized plan of care and coordination with other care providers 2. 24/7 contact phone numbers for assistance for urgent and routine care needs. 3. Service will only be billed when office clinical staff spend 20 minutes or more in a month to coordinate care. 4. Only one practitioner may furnish and bill the service in a calendar month. 5.The patient may stop CCM services at any time (effective at the end of the month) by phone call to the office staff. 6. The patient will be responsible for cost sharing (co-pay) of up to 20% of the service fee (after annual deductible is met). Patient agreed to services and consent obtained.  The patient verbalized understanding of instructions provided today and agreed to receive a Mailed copy of patient instruction and/or educational materials. Telephone follow up appointment with pharmacy team member scheduled for: See next appointment with "Care Management Staff" under "What's Next" below.  High Cholesterol  High cholesterol is a condition in which the blood has high levels of a white, waxy substance similar to fat (cholesterol). The liver makes all the cholesterol that the body needs. The human body needs small amounts of cholesterol to help build cells. A person gets extra or excess cholesterol from the food that he or  she eats. The blood carries cholesterol from the liver to the rest of the body. If you have high cholesterol, deposits (plaques) may build up on the walls of your arteries. Arteries are the blood vessels that carry blood away from your heart. These plaques make the arteries narrow and stiff. Cholesterol plaques increase your risk for heart attack and stroke. Work with your health care provider to keep your cholesterol levels in a healthy range. What increases the risk? The following factors may make you more likely to develop this condition:  Eating foods that are high in animal fat (saturated fat) or cholesterol.  Being overweight.  Not getting enough exercise.  A family history of high cholesterol (familial hypercholesterolemia).  Use of tobacco products.  Having diabetes. What are the signs or symptoms? There are no symptoms of this condition. How is this diagnosed? This condition may be diagnosed based on the results of a blood test.  If you are older than 70 years of age, your health care provider may check your cholesterol levels every 4-6 years.  You may be checked more often if you have high cholesterol or other risk factors for heart disease. The blood test for cholesterol measures:  "Bad" cholesterol, or LDL cholesterol. This is the main type of cholesterol that causes heart disease. The desired level is less than 100 mg/dL.  "Good" cholesterol, or HDL cholesterol. HDL helps protect against heart disease by cleaning the arteries and carrying the LDL to the liver for processing. The desired level for HDL is 60 mg/dL or higher.  Triglycerides. These are fats that your body can store or burn for energy.  The desired level is less than 150 mg/dL.  Total cholesterol. This measures the total amount of cholesterol in your blood and includes LDL, HDL, and triglycerides. The desired level is less than 200 mg/dL. How is this treated? This condition may be treated with:  Diet  changes. You may be asked to eat foods that have more fiber and less saturated fats or added sugar.  Lifestyle changes. These may include regular exercise, maintaining a healthy weight, and quitting use of tobacco products.  Medicines. These are given when diet and lifestyle changes have not worked. You may be prescribed a statin medicine to help lower your cholesterol levels. Follow these instructions at home: Eating and drinking  Eat a healthy, balanced diet. This diet includes: ? Daily servings of a variety of fresh, frozen, or canned fruits and vegetables. ? Daily servings of whole grain foods that are rich in fiber. ? Foods that are low in saturated fats and trans fats. These include poultry and fish without skin, lean cuts of meat, and low-fat dairy products. ? A variety of fish, especially oily fish that contain omega-3 fatty acids. Aim to eat fish at least 2 times a week.  Avoid foods and drinks that have added sugar.  Use healthy cooking methods, such as roasting, grilling, broiling, baking, poaching, steaming, and stir-frying. Do not fry your food except for stir-frying.   Lifestyle  Get regular exercise. Aim to exercise for a total of 150 minutes a week. Increase your activity level by doing activities such as gardening, walking, and taking the stairs.  Do not use any products that contain nicotine or tobacco, such as cigarettes, e-cigarettes, and chewing tobacco. If you need help quitting, ask your health care provider.   General instructions  Take over-the-counter and prescription medicines only as told by your health care provider.  Keep all follow-up visits as told by your health care provider. This is important. Where to find more information  American Heart Association: www.heart.org  National Heart, Lung, and Blood Institute: https://wilson-eaton.com/ Contact a health care provider if:  You have trouble achieving or maintaining a healthy diet or weight.  You are starting  an exercise program.  You are unable to stop smoking. Get help right away if:  You have chest pain.  You have trouble breathing.  You have any symptoms of a stroke. "BE FAST" is an easy way to remember the main warning signs of a stroke: ? B - Balance. Signs are dizziness, sudden trouble walking, or loss of balance. ? E - Eyes. Signs are trouble seeing or a sudden change in vision. ? F - Face. Signs are sudden weakness or numbness of the face, or the face or eyelid drooping on one side. ? A - Arms. Signs are weakness or numbness in an arm. This happens suddenly and usually on one side of the body. ? S - Speech. Signs are sudden trouble speaking, slurred speech, or trouble understanding what people say. ? T - Time. Time to call emergency services. Write down what time symptoms started.  You have other signs of a stroke, such as: ? A sudden, severe headache with no known cause. ? Nausea or vomiting. ? Seizure. These symptoms may represent a serious problem that is an emergency. Do not wait to see if the symptoms will go away. Get medical help right away. Call your local emergency services (911 in the [place] Do not drive yourself to the hospital. Summary  Cholesterol plaques increase your risk for heart  attack and stroke. Work with your health care provider to keep your cholesterol levels in a healthy range.  Eat a healthy, balanced diet, get regular exercise, and maintain a healthy weight.  Do not use any products that contain nicotine or tobacco, such as cigarettes, e-cigarettes, and chewing tobacco.  Get help right away if you have any symptoms of a stroke. This information is not intended to replace advice given to you by your health care provider. Make sure you discuss any questions you have with your health care provider. Document Revised: 12/27/2018 Document Reviewed: 12/27/2018 Elsevier Patient Education  2021 [employer].

## 2020-07-10 ENCOUNTER — Other Ambulatory Visit: Payer: Self-pay | Admitting: Family Medicine

## 2020-07-24 ENCOUNTER — Ambulatory Visit (INDEPENDENT_AMBULATORY_CARE_PROVIDER_SITE_OTHER): Payer: Medicare Other

## 2020-07-24 DIAGNOSIS — I10 Essential (primary) hypertension: Secondary | ICD-10-CM | POA: Diagnosis not present

## 2020-07-24 NOTE — Progress Notes (Signed)
Chronic Care Management Pharmacy Note  07/24/2020 Name:  Christine Hurley MRN:  [id number] DOB:  1950/02/14  Subjective: Christine Hurley is an 70 y.o. year old female who is a primary patient of Leamon Arnt, MD.  The CCM team was consulted for assistance with disease management and care coordination needs.    Engaged with patient by telephone for follow up visit in response to provider referral for pharmacy case management and/or care coordination services.   Consent to Services:  The patient was given information about Chronic Care Management services, agreed to services, and gave verbal consent prior to initiation of services.  Please see initial visit note for detailed documentation.   Patient Care Team: Leamon Arnt, MD as PCP - General (Family Medicine) Arta Silence, MD as Consulting Physician (Gastroenterology) Gevena Cotton, MD as Consulting Physician (Ophthalmology) Harriett Sine, MD as Consulting Physician (Dermatology) Madelin Rear, [facility] as Pharmacist (Pharmacist)  Recent office visits:  05/14/20- Billey Chang, MD-chronic conditions addressed;mammogram, bone density, and labs ordered, follow up 6 months for htn   Recent consult visits:  No visits noted   Hospital visits:  None in previous 6 months Objective:  Lab Results  Component Value Date   CREATININE 0.87 05/14/2020   CREATININE 0.74 01/17/2019   CREATININE 0.73 12/18/2017    Lab Results  Component Value Date   HGBA1C 5.7 06/18/2017   Last diabetic Eye exam: No results found for: HMDIABEYEEXA  Last diabetic Foot exam: No results found for: HMDIABFOOTEX      Component Value Date/Time   CHOL 154 05/14/2020 1452   TRIG 87.0 05/14/2020 1452   HDL 67.00 05/14/2020 1452   CHOLHDL 2 05/14/2020 1452   VLDL 17.4 05/14/2020 1452   LDLCALC 69 05/14/2020 1452   LDLDIRECT 72.0 01/17/2019 1020    Hepatic Function Latest Ref Rng & Units 05/14/2020 01/17/2019 12/18/2017  Total Protein 6.0 - 8.3  g/dL 7.5 7.6 7.7  Albumin 3.5 - 5.2 g/dL 4.3 4.4 -  AST 0 - 37 U/L 18 15 17   ALT 0 - 35 U/L 16 11 11   Alk Phosphatase 39 - 117 U/L 56 66 -  Total Bilirubin 0.2 - 1.2 mg/dL 0.3 0.4 0.4  Bilirubin, Direct 0.1 - 0.5 mg/dL - - -   Lab Results  Component Value Date/Time   TSH 1.48 01/17/2019 10:20 AM   TSH 0.89 12/18/2017 03:25 PM   FREET4 0.89 11/23/2015 12:14 PM    CBC Latest Ref Rng & Units 05/14/2020 01/17/2019 12/18/2017  WBC 4.0 - 10.5 K/uL 6.3 4.5 4.9  Hemoglobin 12.0 - 15.0 g/dL 12.9 13.0 12.4  Hematocrit 36.0 - 46.0 % 37.9 38.0 36.6  Platelets 150.0 - 400.0 K/uL 287.0 309.0 256    Lab Results  Component Value Date/Time   VD25OH 16.86 (L) 01/17/2019 10:20 AM   CAT ASSESSMENT 06/19/20 Rank each of the following items on a scale of 0 to 5 (with 5 being most severe) Write a # 0-5 in each box  I never cough (0) > I cough all the time (5) 3  I have no phlegm (mucus) in my chest (0) > My chest is completely full of phlegm (mucus) (5) 0  My chest does not feel tight at all (0) > My chest feels very tight (5) 0  When I walk up a hill or one flight of stairs I am not breathless (0) > When I walk up a hill or one flight of stairs I am very breathless (5) 2  I am not limited doing any activities at home (0) > I am very limited doing activities at home (5) 2  I am confident leaving my home despite my lung function (0) > I am not at all confident leaving my home because of my lung condition (5)  1  I sleep soundly (0) > I don't sleep soundly because of my lung condition (5) 2  I have lots of energy (0) > I have no energy at all (5) 2  Total CAT Score: 12  Clinical ASCVD:    The 10-year ASCVD risk score Mikey Bussing DC Jr., et al., 2013) is: 15.9%   Values used to calculate the score:     Age: 37 years     Sex: Female     Is Non-Hispanic African American: Yes     Diabetic: No     Tobacco smoker: Yes     Systolic Blood Pressure: 443 mmHg     Is BP treated: Yes     HDL Cholesterol: 67 mg/dL      Total Cholesterol: 154 mg/dL    Social History   Tobacco Use  Smoking Status Every Day   Packs/day: 1.00   Years: 45.00   Pack years: 45.00   Types: Cigarettes  Smokeless Tobacco Never  Tobacco Comments   2 cigs per day    BP Readings from Last 3 Encounters:  05/14/20 118/78  01/17/19 122/74  07/27/18 130/84   Pulse Readings from Last 3 Encounters:  05/14/20 88  01/17/19 76  07/27/18 79   Wt Readings from Last 3 Encounters:  05/14/20 163 lb 9.6 oz (74.2 kg)  01/17/19 161 lb (73 kg)  07/27/18 161 lb 3.2 oz (73.1 kg)   Assessment: Review of patient past medical history, allergies, medications, health status, including review of consultants reports, laboratory and other test data, was performed as part of comprehensive evaluation and provision of chronic care management services.   SDOH:  (Social Determinants of Health) assessments and interventions performed: Yes   CCM Care Plan  No Known Allergies  Medications Reviewed Today     Reviewed by Madelin Rear, [facility] (Pharmacist) on 07/24/20 at 1224  Med List Status: <None>   Medication Order Taking? Sig Documenting Provider Last Dose Status Informant  albuterol (VENTOLIN HFA) 108 (90 Base) MCG/ACT inhaler [id number]  INHALE 1 TO 2 PUFFS INTO THE LUNGS EVERY 6 HOURS AS NEEDED FOR WHEEZING OR SHORTNESS OF BREATH Leamon Arnt, MD  Active   amLODipine (NORVASC) 10 MG tablet [id number] Yes Take 1 tablet (10 mg total) by mouth daily. Leamon Arnt, MD Taking Active   atorvastatin (LIPITOR) 10 MG tablet [id number] Yes Take 1 tablet (10 mg total) by mouth at bedtime. Leamon Arnt, MD Taking Active   lisinopril-hydrochlorothiazide (ZESTORETIC) 20-12.5 MG tablet [id number] Yes Take 2 tablets by mouth daily. Leamon Arnt, MD Taking Active             Patient Active Problem List   Diagnosis Date Noted   Mixed hyperlipidemia 01/17/2019   Marijuana use, episodic 06/18/2017   Cigarette nicotine dependence with  nicotine-induced disorder 11/03/2016   Osteopenia of neck of femur 05/22/2016   Abnormal thallium stress test 03/07/2015   Chronic obstructive pulmonary disease ([place]) 07/01/2013   Essential hypertension 05/26/2013   Chronic hepatitis C without hepatic coma ([place]) 11/04/2006    Immunization History  Administered Date(s) Administered   Influenza Split 04/24/2013   Influenza, High Dose Seasonal PF 09/14/2016, 12/18/2017, 10/11/2018  Influenza, Seasonal, Injecte, Preservative Fre 11/24/2015   Influenza,inj,Quad PF,6+ Mos 11/24/2015   Influenza,inj,quad, With Preservative 03/21/2013   PFIZER(Purple Top)SARS-COV-2 Vaccination 09/10/2019, 10/11/2019   PPD Test 04/02/2015   Pneumococcal Conjugate-13 06/24/2017   Pneumococcal Polysaccharide-23 07/03/2014, 11/24/2015   Pneumococcal-Unspecified 07/03/2014, 11/24/2015   Td 08/10/2008   Zoster Recombinat (Shingrix) 08/10/2018, 08/27/2018, 10/11/2018   Conditions to be addressed/monitored: COPD HLD HTN Nicotine Dependence Osteopenia  There are no care plans that you recently modified to display for this patient. Current Barriers:  Working towards reducing number of cigarettes per day currently 1pack/week  Pharmacist Clinical Goal(s):  Patient will contact provider office for questions/concerns as evidenced notation of same in electronic health record through collaboration with PharmD and provider.   Interventions: 1:1 collaboration with Leamon Arnt, MD regarding development and update of comprehensive plan of care as evidenced by provider attestation and co-signature Inter-disciplinary care team collaboration (see longitudinal plan of care) Comprehensive medication review performed; medication list updated in electronic medical record No Rx changes  Hypertension (BP goal <130/80) -Controlled -Current treatment: Amlodipine 10 mg once daily Lisinopril-HCTZ 20-12.5 mg once daily -Medications previously tried: hydralazine, clonidine  patch   -Home readings: at goal. Walks for exercise -Denies hypotensive/hypertensive symptoms -Recommended to continue current medication  COPD (Goal: control symptoms and prevent exacerbations) -Controlled -Current treatment  Proair inhaler every 6 hours as needed -Gold Grade: Gold 1 (FEV1>80%) -Current COPD Classification:  A (low sx, <2 exacerbations/yr) -Pulmonary function testing: 2016 -Exacerbations requiring treatment in last 6 months: 0 -Frequency of rescue inhaler use: PRN, denies consistent use - no more than 2x/week -Counseled on when to use rescue inhaler and to reach out to the office if symptoms become more frequent -Recommended to continue current medication  Tobacco use (Goal stop smoking) -Uncontrolled  -Previous 2 PPD smoker, now smoking a pack/week. Not interested in replacement therapies or Rx to help stop smoking. Has focused on chewing gum and increasing fluid intake to help -Counseled on replacement therapys, Rx options - declined at this time.  -Declined wanting to set quit date in near future, she foresees being able to completely stop by 02/03/2022  Patient Goals/Self-Care Activities Patient will:  - target a minimum of 150 minutes of moderate intensity exercise weekly  Medication Assistance: None required.  Patient affirms current coverage meets needs.  Patient's preferred pharmacy is: [facility], [place] - [address] AT [place]: [place] call 6 months Follow Up:  Patient agrees to Care Plan and Follow-up.  Future Appointments  Date Time Provider [place]  11/12/2020  9:00 AM Leamon Arnt, MD [facility]  11/19/2020  9:30 AM [facility] MM 2 [facility]  11/19/2020 10:00 AM [facility] DX DEXA 1 [facility]  01/22/2021 10:30 AM [facility] CCM PHARMACIST [facility]   Madelin Rear, PharmD, CPP Clinical Pharmacist Practitioner  [facility]  [phone]

## 2020-07-26 ENCOUNTER — Other Ambulatory Visit: Payer: Self-pay | Admitting: Family Medicine

## 2020-09-17 ENCOUNTER — Telehealth: Payer: Self-pay | Admitting: Pharmacist

## 2020-09-17 NOTE — Chronic Care Management (AMB) (Addendum)
Chronic Care Management Pharmacy Assistant   Name: Christine Hurley  MRN: [id number] DOB: 05/31/1950   Reason for Encounter: Hypertension Disease State Call    Recent office visits:  None  Recent consult visits:  None  Hospital visits:  None in previous 6 months  Medications: Outpatient Encounter Medications as of 09/17/2020  Medication Sig   albuterol (VENTOLIN HFA) 108 (90 Base) MCG/ACT inhaler INHALE 1 TO 2 PUFFS INTO THE LUNGS EVERY 6 HOURS AS NEEDED FOR WHEEZING OR SHORTNESS OF BREATH   amLODipine (NORVASC) 10 MG tablet Take 1 tablet (10 mg total) by mouth daily.   atorvastatin (LIPITOR) 10 MG tablet Take 1 tablet (10 mg total) by mouth at bedtime.   lisinopril-hydrochlorothiazide (ZESTORETIC) 20-12.5 MG tablet TAKE 2 TABLETS BY MOUTH DAILY   No facility-administered encounter medications on file as of 09/17/2020.    Reviewed chart prior to disease state call. Spoke with patient regarding BP  Recent Office Vitals: BP Readings from Last 3 Encounters:  05/14/20 118/78  01/17/19 122/74  07/27/18 130/84   Pulse Readings from Last 3 Encounters:  05/14/20 88  01/17/19 76  07/27/18 79    Wt Readings from Last 3 Encounters:  05/14/20 163 lb 9.6 oz (74.2 kg)  01/17/19 161 lb (73 kg)  07/27/18 161 lb 3.2 oz (73.1 kg)     Kidney Function Lab Results  Component Value Date/Time   CREATININE 0.87 05/14/2020 02:52 PM   CREATININE 0.74 01/17/2019 10:20 AM   CREATININE 0.73 12/18/2017 03:25 PM   GFR 67.65 05/14/2020 02:52 PM   GFRNONAA >60 04/23/2017 10:52 AM   GFRAA >60 04/23/2017 10:52 AM    BMP Latest Ref Rng & Units 05/14/2020 01/17/2019 12/18/2017  Glucose 70 - 99 mg/dL 99 110(H) 74  BUN 6 - 23 mg/dL '17 21 15  '$ Creatinine 0.40 - 1.20 mg/dL 0.87 0.74 0.73  BUN/Creat Ratio 6 - 22 (calc) - - NOT APPLICABLE  Sodium A999333 - 145 mEq/L 138 138 139  Potassium 3.5 - 5.1 mEq/L 4.1 3.8 3.8  Chloride 96 - 112 mEq/L 103 104 105  CO2 19 - 32 mEq/L '25 25 26  '$ Calcium 8.4 - 10.5  mg/dL 9.7 9.5 9.4    Current antihypertensive regimen:  Lisinopril-HCTZ 20-12.5 mg QD Amlodipine 10 mg QD  How often are you checking your Blood Pressure? 1-2x per week  Current home BP readings: 120s/80  What recent interventions/DTPs have been made by any provider to improve Blood Pressure control since last CPP Visit: No interventions/DTPs noted.  Any recent hospitalizations or ED visits since last visit with CPP? No  What diet changes have been made to improve Blood Pressure Control?  Patient states she limits her sodium in her diet.  What exercise is being done to improve your Blood Pressure Control?  Patient states she walks 3-4 times a week for exercise.  Adherence Review: Is the patient currently on ACE/ARB medication? Yes Does the patient have >5 day gap between last estimated fill dates? No  Future Appointments  Date Time Provider [place]  11/12/2020  9:00 AM Leamon Arnt, MD [facility]  11/19/2020  9:30 AM [facility] MM 2 [facility]  11/19/2020 10:00 AM [facility] DX DEXA 1 [facility]  01/22/2021 10:30 AM [facility] CCM PHARMACIST [facility]     Star Rating Drugs: Atorvastatin 10 mg last filled 07/26/2020 90 DS Lisinopril-HCTZ 20/12.5 mg last filled 07/26/2020 90 DS  April D Calhoun, [place] Pharmacist Assistant [phone]  10 minutes spent in review, coordination, and documentation.  Reviewed by: Beverly Milch, PharmD Clinical Pharmacist [phone]

## 2020-10-19 ENCOUNTER — Other Ambulatory Visit: Payer: Self-pay | Admitting: Family Medicine

## 2020-11-12 ENCOUNTER — Encounter: Payer: Self-pay | Admitting: Family Medicine

## 2020-11-12 ENCOUNTER — Other Ambulatory Visit: Payer: Self-pay

## 2020-11-12 ENCOUNTER — Ambulatory Visit (INDEPENDENT_AMBULATORY_CARE_PROVIDER_SITE_OTHER): Payer: Medicare Other | Admitting: Family Medicine

## 2020-11-12 VITALS — BP 118/80 | HR 91 | Temp 98.3°F | Ht 64.0 in | Wt 167.8 lb

## 2020-11-12 DIAGNOSIS — J410 Simple chronic bronchitis: Secondary | ICD-10-CM

## 2020-11-12 DIAGNOSIS — I739 Peripheral vascular disease, unspecified: Secondary | ICD-10-CM | POA: Diagnosis not present

## 2020-11-12 DIAGNOSIS — Z23 Encounter for immunization: Secondary | ICD-10-CM | POA: Diagnosis not present

## 2020-11-12 DIAGNOSIS — M1992 Post-traumatic osteoarthritis, unspecified site: Secondary | ICD-10-CM | POA: Diagnosis not present

## 2020-11-12 DIAGNOSIS — E782 Mixed hyperlipidemia: Secondary | ICD-10-CM | POA: Diagnosis not present

## 2020-11-12 DIAGNOSIS — F17219 Nicotine dependence, cigarettes, with unspecified nicotine-induced disorders: Secondary | ICD-10-CM | POA: Diagnosis not present

## 2020-11-12 DIAGNOSIS — I10 Essential (primary) hypertension: Secondary | ICD-10-CM | POA: Diagnosis not present

## 2020-11-12 MED ORDER — SPIRIVA RESPIMAT 2.5 MCG/ACT IN AERS: 2.0000 | INHALATION_SPRAY | Freq: Every day | RESPIRATORY_TRACT | 11 refills | Status: DC

## 2020-11-12 MED ORDER — AMLODIPINE BESYLATE 10 MG PO TABS: 10.0000 mg | ORAL_TABLET | Freq: Every day | ORAL | 1 refills | Status: DC

## 2020-11-12 NOTE — Progress Notes (Signed)
Subjective  CC:  Chief Complaint  Patient presents with   Hypertension   Hyperlipidemia    HPI: Christine Hurley is a 70 y.o. female who presents to the office today to address the problems listed above in the chief complaint. Hypertension f/u: Control is good . Pt reports she is doing well. taking medications as instructed, no medication side effects noted, no TIAs, no chest pain on exertion, no dyspnea on exertion, no swelling of ankles. She denies adverse effects from his BP medications. Compliance with medication is good.  Requesting a handicap placard: over last 2 months reports legs "get tired" and hurt when walking from parking lot to church or store; has to rest. Feels like an ache, knees down. ? Arthritis or other. No pain at rest. Has h/o leg surgery from trauma and has intermittent pain related to that. No swelling. No new injury.  COPD: using albuterol inhaler most nights of week for wheezing. Denies doe or sob. + chronic cough. Had increased smoking which she feels has worsened wheezing. Trying to cut back again. No chest pain. Alubterol helps. Last pfts 2015.  HLD well controlled on lipitor 10. Tolerates well. Statin could be contributing to leg pain.   Assessment  1. Essential hypertension   2. Mixed hyperlipidemia   3. Cigarette nicotine dependence with nicotine-induced disorder   4. Intermittent claudication ([place])   5. Post-traumatic osteoarthritis, unspecified site   6. Simple chronic bronchitis ([place])      Plan   Hypertension f/u: BP control is well controlled. Continue amlodipine 10, zestoretic 20/12.5 daily.  Hyperlipidemia f/u: on statin. Consider holding lipitor to see if leg pain abates Claudication, possible. Check abi's. Chronic smoker w/ decreased pedal pulses COPD: education. Rec quitting smoking. Start spiriva daily and lessen albuterol use.  Flu shot today.  6 month handicap sitcker until eval for leg pain is complete.  Education regarding management  of these chronic disease states was given. Management strategies discussed on successive visits include dietary and exercise recommendations, goals of achieving and maintaining IBW, and lifestyle modifications aiming for adequate sleep and minimizing stressors.   Follow up: 6 mo for cpe  Orders Placed This Encounter  Procedures   PCV ANKLE BRACHIAL INDEX (ABI)    Meds ordered this encounter  Medications   amLODipine (NORVASC) 10 MG tablet    Sig: Take 1 tablet (10 mg total) by mouth daily.    Dispense:  90 tablet    Refill:  1   Tiotropium Bromide Monohydrate (SPIRIVA RESPIMAT) 2.5 MCG/ACT AERS    Sig: Inhale 2 puffs into the lungs daily.    Dispense:  4 g    Refill:  11       BP Readings from Last 3 Encounters:  11/12/20 118/80  05/14/20 118/78  01/17/19 122/74   Wt Readings from Last 3 Encounters:  11/12/20 167 lb 12.8 oz (76.1 kg)  05/14/20 163 lb 9.6 oz (74.2 kg)  01/17/19 161 lb (73 kg)    Lab Results  Component Value Date   CHOL 154 05/14/2020   CHOL 163 01/17/2019   CHOL 176 09/22/2017   Lab Results  Component Value Date   HDL 67.00 05/14/2020   HDL 68.90 01/17/2019   HDL 77.40 09/22/2017   Lab Results  Component Value Date   LDLCALC 69 05/14/2020   LDLCALC 80 09/22/2017   LDLCALC 140 (H) 06/18/2017   Lab Results  Component Value Date   TRIG 87.0 05/14/2020   TRIG 211.0 (H)  01/17/2019   TRIG 91.0 09/22/2017   Lab Results  Component Value Date   CHOLHDL 2 05/14/2020   CHOLHDL 2 01/17/2019   CHOLHDL 2 09/22/2017   Lab Results  Component Value Date   LDLDIRECT 72.0 01/17/2019   Lab Results  Component Value Date   CREATININE 0.87 05/14/2020   BUN 17 05/14/2020   NA 138 05/14/2020   K 4.1 05/14/2020   CL 103 05/14/2020   CO2 25 05/14/2020    The 10-year ASCVD risk score (Arnett DK, et al., 2019) is: 15.9%   Values used to calculate the score:     Age: 4 years     Sex: Female     Is Non-Hispanic African American: Yes     Diabetic:  No     Tobacco smoker: Yes     Systolic Blood Pressure: 128 mmHg     Is BP treated: Yes     HDL Cholesterol: 67 mg/dL     Total Cholesterol: 154 mg/dL  I reviewed the patients updated PMH, FH, and SocHx.    Patient Active Problem List   Diagnosis Date Noted   Mixed hyperlipidemia 01/17/2019    Priority: 1.   Cigarette nicotine dependence with nicotine-induced disorder 11/03/2016    Priority: 1.   Essential hypertension 05/26/2013    Priority: 1.   Chronic hepatitis C without hepatic coma ([place]) 11/04/2006    Priority: 1.   Marijuana use, episodic 06/18/2017    Priority: 2.   Osteopenia of neck of femur 05/22/2016    Priority: 2.   Abnormal thallium stress test 03/07/2015    Priority: 2.   Chronic obstructive pulmonary disease ([place]) 07/01/2013    Priority: 2.    Allergies: Patient has no known allergies.  Social History: Patient  reports that she has been smoking cigarettes. She has a 45.00 pack-year smoking history. She has never used smokeless tobacco. She reports current alcohol use of about 2.0 standard drinks per week. She reports current drug use. Drug: Marijuana.  Current Meds  Medication Sig   albuterol (VENTOLIN HFA) 108 (90 Base) MCG/ACT inhaler INHALE 1 TO 2 PUFFS INTO THE LUNGS EVERY 6 HOURS AS NEEDED FOR WHEEZING OR SHORTNESS OF BREATH   atorvastatin (LIPITOR) 10 MG tablet TAKE 1 TABLET(10 MG) BY MOUTH AT BEDTIME   lisinopril-hydrochlorothiazide (ZESTORETIC) 20-12.5 MG tablet TAKE 2 TABLETS BY MOUTH DAILY   Tiotropium Bromide Monohydrate (SPIRIVA RESPIMAT) 2.5 MCG/ACT AERS Inhale 2 puffs into the lungs daily.   [DISCONTINUED] amLODipine (NORVASC) 10 MG tablet Take 1 tablet (10 mg total) by mouth daily.    Review of Systems: Cardiovascular: negative for chest pain, palpitations, leg swelling, orthopnea Respiratory: negative for SOB, wheezing or persistent cough Gastrointestinal: negative for abdominal pain Genitourinary: negative for dysuria or gross  hematuria  Objective  Vitals: BP 118/80   Pulse 91   Temp 98.3 F (36.8 C) (Temporal)   Ht 5\' 4"  (1.626 m)   Wt 167 lb 12.8 oz (76.1 kg)   SpO2 94%   BMI 28.80 kg/m  General: no acute distress  Psych:  Alert and oriented, normal mood and affect HEENT:  Normocephalic, atraumatic, supple neck  Cardiovascular:  RRR without murmur. no edema Decreased pedal pulses bilaterally Respiratory:  Good breath sounds bilaterally, CTAB with normal respiratory effort Skin:  Warm, no rashes Neurologic:   Mental status is normal Commons side effects, risks, benefits, and alternatives for medications and treatment plan prescribed today were discussed, and the patient expressed understanding of the  given instructions. Patient is instructed to call or message via MyChart if he/she has any questions or concerns regarding our treatment plan. No barriers to understanding were identified. We discussed Red Flag symptoms and signs in detail. Patient expressed understanding regarding what to do in case of urgent or emergency type symptoms.  Medication list was reconciled, printed and provided to the patient in AVS. Patient instructions and summary information was reviewed with the patient as documented in the AVS. This note was prepared with assistance of Dragon voice recognition software. Occasional wrong-word or sound-a-like substitutions may have occurred due to the inherent limitations of voice recognition software  This visit occurred during the SARS-CoV-2 public health emergency.  Safety protocols were in place, including screening questions prior to the visit, additional usage of staff PPE, and extensive cleaning of exam room while observing appropriate contact time as indicated for disinfecting solutions.

## 2020-11-12 NOTE — Patient Instructions (Signed)
Please return in 6 months for your annual complete physical; please come fasting.   I've ordered a new inhaler to use daily. Use the albuterol only when needed. The new inhaler should lessen the need for the albuterol. Cut back on smoking to help as well.   We will call you the get the ABI study done on your legs.   If you have any questions or concerns, please don't hesitate to send me a message via MyChart or call the office at [phone]. Thank you for visiting with [place] today! It's our pleasure caring for you.

## 2020-11-12 NOTE — Addendum Note (Signed)
Addended by: Billey Chang on: 11/12/2020 09:47 AM   Modules accepted: Orders

## 2020-11-14 ENCOUNTER — Other Ambulatory Visit: Payer: Medicare Other

## 2020-11-19 ENCOUNTER — Other Ambulatory Visit: Payer: Medicare Other

## 2020-11-19 ENCOUNTER — Ambulatory Visit: Payer: Medicare Other

## 2020-11-29 ENCOUNTER — Telehealth: Payer: Self-pay | Admitting: Pharmacist

## 2020-11-29 NOTE — Chronic Care Management (AMB) (Signed)
Chronic Care Management Pharmacy Assistant   Name: Christine Hurley  MRN: [id number] DOB: March 03, 1950   Reason for Encounter: Hypertension Adherence Call    Recent office visits:  11/12/2020 OV (PCP) Leamon Arnt, MD; nsider holding lipitor to see if leg pain abates  Recent consult visits:  None  Hospital visits:  None in previous 6 months  Medications: Outpatient Encounter Medications as of 11/29/2020  Medication Sig   albuterol (VENTOLIN HFA) 108 (90 Base) MCG/ACT inhaler INHALE 1 TO 2 PUFFS INTO THE LUNGS EVERY 6 HOURS AS NEEDED FOR WHEEZING OR SHORTNESS OF BREATH   amLODipine (NORVASC) 10 MG tablet Take 1 tablet (10 mg total) by mouth daily.   atorvastatin (LIPITOR) 10 MG tablet TAKE 1 TABLET(10 MG) BY MOUTH AT BEDTIME   lisinopril-hydrochlorothiazide (ZESTORETIC) 20-12.5 MG tablet TAKE 2 TABLETS BY MOUTH DAILY   Tiotropium Bromide Monohydrate (SPIRIVA RESPIMAT) 2.5 MCG/ACT AERS Inhale 2 puffs into the lungs daily.   No facility-administered encounter medications on file as of 11/29/2020.    Reviewed chart prior to disease state call. Spoke with patient regarding BP  Recent Office Vitals: BP Readings from Last 3 Encounters:  11/12/20 118/80  05/14/20 118/78  01/17/19 122/74   Pulse Readings from Last 3 Encounters:  11/12/20 91  05/14/20 88  01/17/19 76    Wt Readings from Last 3 Encounters:  11/12/20 167 lb 12.8 oz (76.1 kg)  05/14/20 163 lb 9.6 oz (74.2 kg)  01/17/19 161 lb (73 kg)     Kidney Function Lab Results  Component Value Date/Time   CREATININE 0.87 05/14/2020 02:52 PM   CREATININE 0.74 01/17/2019 10:20 AM   CREATININE 0.73 12/18/2017 03:25 PM   GFR 67.65 05/14/2020 02:52 PM   GFRNONAA >60 04/23/2017 10:52 AM   GFRAA >60 04/23/2017 10:52 AM    BMP Latest Ref Rng & Units 05/14/2020 01/17/2019 12/18/2017  Glucose 70 - 99 mg/dL 99 110(H) 74  BUN 6 - 23 mg/dL 17 21 15   Creatinine 0.40 - 1.20 mg/dL 0.87 0.74 0.73  BUN/Creat Ratio 6 - 22 (calc)  - - NOT APPLICABLE  Sodium 195 - 145 mEq/L 138 138 139  Potassium 3.5 - 5.1 mEq/L 4.1 3.8 3.8  Chloride 96 - 112 mEq/L 103 104 105  CO2 19 - 32 mEq/L 25 25 26   Calcium 8.4 - 10.5 mg/dL 9.7 9.5 9.4    Current antihypertensive regimen:  Amlodipine 10 mg daily Lisinopril-HCTZ 20-12.5 mg two tablets daily  How often are you checking your Blood Pressure?   Current home BP readings:   What recent interventions/DTPs have been made by any provider to improve Blood Pressure control since last CPP Visit:   Any recent hospitalizations or ED visits since last visit with CPP? No  What diet changes have been made to improve Blood Pressure Control?   What exercise is being done to improve your Blood Pressure Control?    Adherence Review: Is the patient currently on ACE/ARB medication?  Does the patient have >5 day gap between last estimated fill dates?    **Several unsuccessful attempts at reaching the patient to complete adherence call.**  Care Gaps: Medicare Annual Wellness: Due now Hemoglobin A1C: 5.6% on 11/23/2015 Colonoscopy: Next due on 07/24/2024 Dexa Scan: scheduled for 04/25/2021 Mammogram: scheduled for 04/25/2021  Future Appointments  Date Time Provider [place]  01/22/2021 10:30 AM [facility] CCM PHARMACIST [facility] PEC  04/25/2021  8:30 AM [facility] MM 3 [facility]BCGMM [facility]  04/25/2021  9:00 AM [facility] DX  DEXA 1 GI-BCGDG [facility]  05/14/2021 10:00 AM Leamon Arnt, MD [facility] PEC    Star Rating Drugs: Atorvastatin 10 mg last filled 07/26/2020 90 DS Lisinopril-HCTZ 20/12.5 mg last filled 07/26/2020 90 DS  April D Calhoun, [place] Pharmacist Assistant [phone]

## 2021-01-21 NOTE — Progress Notes (Deleted)
Chronic Care Management Pharmacy Note  01/21/2021 Name:  Christine Hurley MRN:  [id number] DOB:  01-Jun-1950  Subjective: Christine Hurley is an 70 y.o. year old female who is a primary patient of Leamon Arnt, MD.  The CCM team was consulted for assistance with disease management and care coordination needs.    Engaged with patient by telephone for follow up visit in response to provider referral for pharmacy case management and/or care coordination services.   Consent to Services:  The patient was given information about Chronic Care Management services, agreed to services, and gave verbal consent prior to initiation of services.  Please see initial visit note for detailed documentation.   Patient Care Team: Leamon Arnt, MD as PCP - General (Family Medicine) Arta Silence, MD as Consulting Physician (Gastroenterology) Gevena Cotton, MD as Consulting Physician (Ophthalmology) Harriett Sine, MD as Consulting Physician (Dermatology) Madelin Rear, [facility] as Pharmacist (Pharmacist)  Recent office visits:  05/14/20- Billey Chang, MD-chronic conditions addressed;mammogram, bone density, and labs ordered, follow up 6 months for htn   Recent consult visits:  No visits noted   Hospital visits:  None in previous 6 months Objective:  Lab Results  Component Value Date   CREATININE 0.87 05/14/2020   CREATININE 0.74 01/17/2019   CREATININE 0.73 12/18/2017    Lab Results  Component Value Date   HGBA1C 5.7 06/18/2017   Last diabetic Eye exam: No results found for: HMDIABEYEEXA  Last diabetic Foot exam: No results found for: HMDIABFOOTEX      Component Value Date/Time   CHOL 154 05/14/2020 1452   TRIG 87.0 05/14/2020 1452   HDL 67.00 05/14/2020 1452   CHOLHDL 2 05/14/2020 1452   VLDL 17.4 05/14/2020 1452   LDLCALC 69 05/14/2020 1452   LDLDIRECT 72.0 01/17/2019 1020    Hepatic Function Latest Ref Rng & Units 05/14/2020 01/17/2019 12/18/2017  Total Protein 6.0 - 8.3  g/dL 7.5 7.6 7.7  Albumin 3.5 - 5.2 g/dL 4.3 4.4 -  AST 0 - 37 U/L 18 15 17   ALT 0 - 35 U/L 16 11 11   Alk Phosphatase 39 - 117 U/L 56 66 -  Total Bilirubin 0.2 - 1.2 mg/dL 0.3 0.4 0.4  Bilirubin, Direct 0.1 - 0.5 mg/dL - - -   Lab Results  Component Value Date/Time   TSH 1.48 01/17/2019 10:20 AM   TSH 0.89 12/18/2017 03:25 PM   FREET4 0.89 11/23/2015 12:14 PM    CBC Latest Ref Rng & Units 05/14/2020 01/17/2019 12/18/2017  WBC 4.0 - 10.5 K/uL 6.3 4.5 4.9  Hemoglobin 12.0 - 15.0 g/dL 12.9 13.0 12.4  Hematocrit 36.0 - 46.0 % 37.9 38.0 36.6  Platelets 150.0 - 400.0 K/uL 287.0 309.0 256     CAT ASSESSMENT  Rank each of the following items on a scale of 0 to 5 (with 5 being most severe) Write a # 0-5 in each box  I never cough (0) > I cough all the time (5) {Numbers; 0-5:140013}  I have no phlegm (mucus) in my chest (0) > My chest is completely full of phlegm (mucus) (5) {Numbers; 0-5:140013}  My chest does not feel tight at all (0) > My chest feels very tight (5) {Numbers; 0-5:140013}  When I walk up a hill or one flight of stairs I am not breathless (0) > When I walk up a hill or one flight of stairs I am very breathless (5) {Numbers; 0-5:140013}  I am not limited doing any activities at home (0) >  I am very limited doing activities at home (5) {Numbers; 0-5:140013}  I am confident leaving my home despite my lung function (0) > I am not at all confident leaving my home because of my lung condition (5)  {Numbers; 0-5:140013}  I sleep soundly (0) > I don't sleep soundly because of my lung condition (5) {Numbers; 0-5:140013}  I have lots of energy (0) > I have no energy at all (5) {Numbers; 0-5:140013}   Total CAT Score: ***    Clinical ASCVD:    The 10-year ASCVD risk score (Arnett DK, et al., 2019) is: 15.9%   Values used to calculate the score:     Age: 71 years     Sex: Female     Is Non-Hispanic African American: Yes     Diabetic: No     Tobacco smoker: Yes     Systolic Blood  Pressure: 118 mmHg     Is BP treated: Yes     HDL Cholesterol: 67 mg/dL     Total Cholesterol: 154 mg/dL    Social History   Tobacco Use  Smoking Status Every Day   Packs/day: 1.00   Years: 45.00   Pack years: 45.00   Types: Cigarettes  Smokeless Tobacco Never  Tobacco Comments   2 cigs per day    BP Readings from Last 3 Encounters:  11/12/20 118/80  05/14/20 118/78  01/17/19 122/74   Pulse Readings from Last 3 Encounters:  11/12/20 91  05/14/20 88  01/17/19 76   Wt Readings from Last 3 Encounters:  11/12/20 167 lb 12.8 oz (76.1 kg)  05/14/20 163 lb 9.6 oz (74.2 kg)  01/17/19 161 lb (73 kg)   Assessment: Review of patient past medical history, allergies, medications, health status, including review of consultants reports, laboratory and other test data, was performed as part of comprehensive evaluation and provision of chronic care management services.   SDOH:  (Social Determinants of Health) assessments and interventions performed: Yes   CCM Care Plan  No Known Allergies  Medications Reviewed Today     Reviewed by Leamon Arnt, MD (Physician) on 11/12/20 at 0857  Med List Status: <None>   Medication Order Taking? Sig Documenting Provider Last Dose Status Informant  albuterol (VENTOLIN HFA) 108 (90 Base) MCG/ACT inhaler [id number] Yes INHALE 1 TO 2 PUFFS INTO THE LUNGS EVERY 6 HOURS AS NEEDED FOR WHEEZING OR SHORTNESS OF BREATH Leamon Arnt, MD Taking Active   amLODipine (NORVASC) 10 MG tablet [id number] Yes Take 1 tablet (10 mg total) by mouth daily. Leamon Arnt, MD Taking Active   atorvastatin (LIPITOR) 10 MG tablet [id number] Yes TAKE 1 TABLET(10 MG) BY MOUTH AT BEDTIME Leamon Arnt, MD Taking Active   lisinopril-hydrochlorothiazide (ZESTORETIC) 20-12.5 MG tablet [id number] Yes TAKE 2 TABLETS BY MOUTH DAILY Leamon Arnt, MD Taking Active             Patient Active Problem List   Diagnosis Date Noted   Mixed hyperlipidemia 01/17/2019    Marijuana use, episodic 06/18/2017   Cigarette nicotine dependence with nicotine-induced disorder 11/03/2016   Osteopenia of neck of femur 05/22/2016   Abnormal thallium stress test 03/07/2015   Chronic obstructive pulmonary disease ([place]) 07/01/2013   Essential hypertension 05/26/2013   Chronic hepatitis C without hepatic coma ([place]) 11/04/2006    Immunization History  Administered Date(s) Administered   Fluad Quad(high Dose 65+) 11/12/2020   Influenza Split 04/24/2013   Influenza, High Dose Seasonal PF 09/14/2016, 12/18/2017, 10/11/2018  Influenza, Seasonal, Injecte, Preservative Fre 11/24/2015   Influenza,inj,Quad PF,6+ Mos 11/24/2015   Influenza,inj,quad, With Preservative 03/21/2013   PFIZER(Purple Top)SARS-COV-2 Vaccination 09/10/2019, 10/11/2019   PPD Test 04/02/2015   Pneumococcal Conjugate-13 06/24/2017   Pneumococcal Polysaccharide-23 07/03/2014, 11/24/2015   Pneumococcal-Unspecified 07/03/2014, 11/24/2015   Td 08/10/2008   Zoster Recombinat (Shingrix) 08/10/2018, 08/27/2018, 10/11/2018   Conditions to be addressed/monitored: COPD HLD HTN Nicotine Dependence Osteopenia  There are no care plans that you recently modified to display for this patient.   Future Appointments  Date Time Provider [place]  01/22/2021 10:30 AM [facility] CCM PHARMACIST [facility] PEC  04/25/2021  8:30 AM [facility]G MM 3 GI-BCGMM GI-BRE[facility] CE  04/25/2021  9:00 AM [facility] DX DEXA 1 GI-BCGDG GI-BRE[facility] CE  05/14/2021 10:00 AM Leamon Arnt, MD [facility] [place], PharmD Clinical Pharmacist  [place] [phone]   Current Barriers:  Working towards reducing number of cigarettes per day currently 0.25 PPD  Pharmacist Clinical Goal(s):  Patient will contact provider office for questions/concerns as evidenced notation of same in electronic health record through collaboration with PharmD and provider.   Interventions: 1:1 collaboration with Leamon Arnt, MD  regarding development and update of comprehensive plan of care as evidenced by provider attestation and co-signature Inter-disciplinary care team collaboration (see longitudinal plan of care) Comprehensive medication review performed; medication list updated in electronic medical record No medication changes, albuterol rescue inhaler refilled  Hypertension (BP goal <130/80) -Controlled -Current treatment: Amlodipine 10 mg once daily Lisinopril-HCTZ 20-12.5 mg once daily -Medications previously tried: hydralazine, clonidine patch   -Home readings: at goal 114/72 139/93 138/91. Is walking every other day currently.  -Denies hypotensive/hypertensive symptoms -Educated on Exercise goal of 150 minutes per week; -Recommended to continue current medication  Hyperlipidemia: (LDL goal < 70) -Controlled -Current treatment: Atorvastatin 10 mg once daily -Reviewed cholesterol goals, denies any side effects or problems. -Recommended to continue current medication  COPD (Goal: control symptoms and prevent exacerbations) -Controlled -Current treatment  Proair inhaler every 6 hours as needed -Gold Grade: Gold 1 (FEV1>80%) -Current COPD Classification:  A (low sx, <2 exacerbations/yr) -CAT score: 12. -Pulmonary function testing: 2016 -Exacerbations requiring treatment in last 6 months: 0 -Frequency of rescue inhaler use: PRN, denies consistent use -Counseled on When to use rescue inhaler -Recommended to continue current medication  Tobacco use (Goal stop smoking) -Uncontrolled  -2ppd - > 0.25 PPD. Now drinking lots of juices and water and chews gum to replace cigarettes. No formal previous quit attempts.  -Counseled on replacement therapys, Rx options - declined at this time. Preference is to quit cold [place] -Agreed on plan to review smoking cessation in one month and further assess  Update 01/22/22  Osteopenia (Goal maintain bone density, optimize supplementation) -Controlled -Last  DEXA Scan: 2018  -Patient is not a candidate for pharmacologic treatment -Current treatment  Vitamin D3 1000 unts  -Recommend [phone] units of vitamin D daily. Recommend weight-bearing and muscle strengthening exercises for building and maintaining bone density.  -Is already scheduled for DEXA repeat  Update 01/22/22 DEXA scheduled for 04/25/21  Patient Goals/Self-Care Activities Patient will:  - target a minimum of 150 minutes of moderate intensity exercise weekly  Medication Assistance: None required.  Patient affirms current coverage meets needs.  Patient's preferred pharmacy is: [id number] - [place], [place] [address] AT [place]: [place] call 1 month to review smoking cessation and possibly establish quit  date, appropriate d3 supplementation Follow Up:  Patient agrees to Care Plan and Follow-up.

## 2021-01-22 ENCOUNTER — Telehealth: Payer: Medicare Other

## 2021-01-23 ENCOUNTER — Other Ambulatory Visit: Payer: Self-pay

## 2021-01-30 ENCOUNTER — Telehealth: Payer: Self-pay

## 2021-01-30 NOTE — Telephone Encounter (Signed)
Patient states she picked up a script from October at her pharmacy today for amlodipine.  Patient states she is throwing it in the trash and wants it removed from med list.

## 2021-01-31 ENCOUNTER — Other Ambulatory Visit: Payer: Self-pay

## 2021-01-31 NOTE — Telephone Encounter (Signed)
Will remove medication.

## 2021-03-11 ENCOUNTER — Other Ambulatory Visit: Payer: Self-pay | Admitting: Family Medicine

## 2021-03-11 NOTE — Telephone Encounter (Signed)
The original prescription was discontinued on 01/31/2021 by Gean Birchwood, CMA for the following reason: Patient Preference. Renewing this prescription may not be appropriate.

## 2021-03-20 NOTE — Telephone Encounter (Signed)
Called pt to clarify about the RX below, no answer and unable to leave message " call can not be completed at this time", will try to call her back later.

## 2021-04-11 NOTE — Telephone Encounter (Signed)
Tried to call pt multiple times and left messages for her to call us back to clarify her request. Will close this encounter and if I hear back from her, I will addend it.  ?

## 2021-04-25 ENCOUNTER — Ambulatory Visit: Payer: Medicare Other

## 2021-04-25 ENCOUNTER — Other Ambulatory Visit: Payer: Self-pay | Admitting: Family Medicine

## 2021-04-25 ENCOUNTER — Inpatient Hospital Stay: Admission: RE | Admit: 2021-04-25 | Payer: Medicare Other | Source: Ambulatory Visit

## 2021-05-02 ENCOUNTER — Other Ambulatory Visit: Payer: Self-pay | Admitting: Family Medicine

## 2021-05-02 NOTE — Telephone Encounter (Signed)
Is patient on this? Not on med list. Please advise below. ?

## 2021-05-14 ENCOUNTER — Encounter: Payer: Medicare Other | Admitting: Family Medicine

## 2021-06-01 ENCOUNTER — Other Ambulatory Visit: Payer: Self-pay | Admitting: Family Medicine

## 2021-06-27 ENCOUNTER — Telehealth: Payer: Self-pay | Admitting: Family Medicine

## 2021-06-27 NOTE — Telephone Encounter (Signed)
Copied from [place] [phone]. Topic: Medicare AWV >> Jun 27, 2021  9:47 AM Harris-Coley, Hannah Beat wrote: Reason for CRM: Attempted to schedule AWV. Unable to LVM.  Will try at later time.

## 2021-08-02 ENCOUNTER — Other Ambulatory Visit: Payer: Self-pay | Admitting: Family Medicine

## 2021-09-12 ENCOUNTER — Telehealth: Payer: Self-pay | Admitting: Family Medicine

## 2021-09-12 NOTE — Telephone Encounter (Signed)
Pt states: -needs permanent handicap placard.   Pt asks: -Is an office visit needed?   Pt requests: -Call back with information about how to proceed   Last appointment: 11/12/2020 OV with PCP

## 2021-09-16 ENCOUNTER — Other Ambulatory Visit: Payer: Self-pay | Admitting: Family Medicine

## 2021-09-16 ENCOUNTER — Other Ambulatory Visit: Payer: Self-pay

## 2021-09-16 ENCOUNTER — Telehealth: Payer: Self-pay | Admitting: Family Medicine

## 2021-09-16 DIAGNOSIS — E782 Mixed hyperlipidemia: Secondary | ICD-10-CM

## 2021-09-16 DIAGNOSIS — I10 Essential (primary) hypertension: Secondary | ICD-10-CM

## 2021-09-16 MED ORDER — LISINOPRIL-HYDROCHLOROTHIAZIDE 20-12.5 MG PO TABS: 2.0000 | ORAL_TABLET | Freq: Every day | ORAL | 1 refills | Status: DC

## 2021-09-16 MED ORDER — ATORVASTATIN CALCIUM 10 MG PO TABS
ORAL_TABLET | ORAL | 1 refills | Status: DC
Start: 2021-09-16 — End: 2021-09-19

## 2021-09-16 NOTE — Telephone Encounter (Signed)
  LAST APPOINTMENT DATE:  Please schedule appointment if longer than 1 year  11/12/20  NEXT APPOINTMENT DATE: 09/19/21  MEDICATION: atorvastatin (LIPITOR) 10 MG tablet  AND  Amlodipine 10 mg  AND   lisinopril-hydrochlorothiazide (ZESTORETIC) 20-12.5 MG tablet  Is the patient out of medication? Approx. 2 days  PHARMACY:  [facility], [place] AT [address] [place] Phone:  [phone]  Fax:  [phone]     Let patient know to contact pharmacy at the end of the day to make sure medication is ready.  Please notify patient to allow 48-72 hours to process

## 2021-09-16 NOTE — Telephone Encounter (Signed)
Rx sent 

## 2021-09-16 NOTE — Telephone Encounter (Signed)
Patient requests to be called at ph# [phone] for status of Handicap placard for 2 vehicles request (see 09/12/21 message).

## 2021-09-19 ENCOUNTER — Ambulatory Visit (INDEPENDENT_AMBULATORY_CARE_PROVIDER_SITE_OTHER): Payer: Medicare Other | Admitting: Family Medicine

## 2021-09-19 ENCOUNTER — Encounter: Payer: Self-pay | Admitting: Family Medicine

## 2021-09-19 VITALS — BP 130/72 | HR 95 | Temp 97.6°F | Ht 64.0 in | Wt 163.6 lb

## 2021-09-19 DIAGNOSIS — I10 Essential (primary) hypertension: Secondary | ICD-10-CM

## 2021-09-19 DIAGNOSIS — F17219 Nicotine dependence, cigarettes, with unspecified nicotine-induced disorders: Secondary | ICD-10-CM

## 2021-09-19 DIAGNOSIS — I739 Peripheral vascular disease, unspecified: Secondary | ICD-10-CM | POA: Diagnosis not present

## 2021-09-19 DIAGNOSIS — Z122 Encounter for screening for malignant neoplasm of respiratory organs: Secondary | ICD-10-CM

## 2021-09-19 DIAGNOSIS — Z1231 Encounter for screening mammogram for malignant neoplasm of breast: Secondary | ICD-10-CM

## 2021-09-19 DIAGNOSIS — E782 Mixed hyperlipidemia: Secondary | ICD-10-CM

## 2021-09-19 DIAGNOSIS — B182 Chronic viral hepatitis C: Secondary | ICD-10-CM

## 2021-09-19 DIAGNOSIS — Z0000 Encounter for general adult medical examination without abnormal findings: Secondary | ICD-10-CM | POA: Diagnosis not present

## 2021-09-19 DIAGNOSIS — J410 Simple chronic bronchitis: Secondary | ICD-10-CM

## 2021-09-19 MED ORDER — ASPIRIN 81 MG PO TBEC: 81.0000 mg | DELAYED_RELEASE_TABLET | Freq: Every day | ORAL | Status: DC

## 2021-09-19 MED ORDER — LISINOPRIL-HYDROCHLOROTHIAZIDE 20-12.5 MG PO TABS: 2.0000 | ORAL_TABLET | Freq: Every day | ORAL | 3 refills | Status: DC

## 2021-09-19 MED ORDER — ATORVASTATIN CALCIUM 10 MG PO TABS: 10.0000 mg | ORAL_TABLET | Freq: Every day | ORAL | 3 refills | Status: DC

## 2021-09-19 NOTE — Progress Notes (Signed)
Subjective  Chief Complaint  Patient presents with   Annual Exam    Pt here for Annual exam and is currently fast     HPI: Christine Hurley is a 71 y.o. female who presents to [facility] today for a Female Wellness Visit. She also has the concerns and/or needs as listed above in the chief complaint. These will be addressed in addition to the Health Maintenance Visit.   Wellness Visit: annual visit with health maintenance review and exam without Pap  Health maintenance: Overdue for mammogram.  She reports she will schedule.  Colonoscopy is up-to-date.  She is a long-term smoker with greater than 45-pack-year history.  Precontemplative about quitting.  COPD by PFTs.  Eligible for lung cancer screening.  Immunizations are current. Chronic disease f/u and/or acute problem visit: (deemed necessary to be done in addition to the wellness visit): Hypertension on Zestoretic 20/12.5 daily.  Reports she is compliant with her medications.  No chest pain.  No adverse effects.  Recent blood work revealed normal potassium and renal function Hyperlipidemia on Lipitor 10 nightly.  LDL is at goal.  Normal LFTs tolerates well Chronic smoker: Not interested in quitting. Complains of pain in after walking.  Relieved with sitting.  She brought this up almost a year ago.  ABIs ordered but never got scheduled.  She does not take an aspirin daily. Chronic hep C without abdominal pain or jaundice   Assessment  1. Annual physical exam   2. Essential hypertension   3. Mixed hyperlipidemia   4. Cigarette nicotine dependence with nicotine-induced disorder   5. Simple chronic bronchitis ([place])   6. Intermittent claudication ([place])   7. Chronic hepatitis C without hepatic coma (HCC) Chronic  8. Encounter for screening for lung cancer   9. Encounter for screening mammogram for breast cancer      Plan  Female Wellness Visit: Age appropriate Health Maintenance and Prevention measures were  discussed with patient. Included topics are cancer screening recommendations, ways to keep healthy (see AVS) including dietary and exercise recommendations, regular eye and dental care, use of seat belts, and avoidance of moderate alcohol use and tobacco use.  BMI: discussed patient's BMI and encouraged positive lifestyle modifications to help get to or maintain a target BMI. HM needs and immunizations were addressed and ordered. See below for orders. See HM and immunization section for updates. Routine labs and screening tests ordered including cmp, cbc and lipids where appropriate. Discussed recommendations regarding Vit D and calcium supplementation (see AVS)  Chronic disease management visit and/or acute problem visit: Hypertension is well-controlled continue Zestoretic 20/12.5 daily.  Medications refilled Hyperlipidemia on Lipitor 10 mg nightly.  Refilled.  At goal Elevated cardiovascular risk due to smoking hyperlipidemia hypertension and possibly claudication.  Recommend baby aspirin daily. Claudication: Arterial Dopplers ordered. Recommend smoking cessation however patient is not interested.  Will refer for lung cancer screening.  Education given.  Follow up: Return in about 6 months (around 03/22/2022) for follow up Hypertension.  Orders Placed This Encounter  Procedures   MM DIGITAL SCREENING BILATERAL   Ambulatory Referral for Lung Cancer Scre   VAS [place] ABI WITH/WO TBI   Meds ordered this encounter  Medications   atorvastatin (LIPITOR) 10 MG tablet    Sig: Take 1 tablet (10 mg total) by mouth at bedtime.    Dispense:  90 tablet    Refill:  3   lisinopril-hydrochlorothiazide (ZESTORETIC) 20-12.5 MG tablet    Sig: Take 2  tablets by mouth daily.    Dispense:  180 tablet    Refill:  3   aspirin EC 81 MG tablet    Sig: Take 1 tablet (81 mg total) by mouth daily.      Body mass index is 28.08 kg/m. Wt Readings from Last 3 Encounters:  09/19/21 163 lb 9.6 oz (74.2 kg)   11/12/20 167 lb 12.8 oz (76.1 kg)  05/14/20 163 lb 9.6 oz (74.2 kg)     Patient Active Problem List   Diagnosis Date Noted   Mixed hyperlipidemia 01/17/2019    Priority: High   Cigarette nicotine dependence with nicotine-induced disorder 11/03/2016    Priority: High   Essential hypertension 05/26/2013    Priority: High   Chronic hepatitis C without hepatic coma ([place]) 11/04/2006    Priority: High    Managed by Dr. Arta Silence; no effects on liver; completed treatment 2019    Marijuana use, episodic 06/18/2017    Priority: Medium     Reports uses it for her leg pain    Osteopenia of neck of femur 05/22/2016    Priority: Medium     dexa 04/2016: T = - 1.4 lowest at left femoral neck; nl at lumbar spine.     Abnormal thallium stress test 03/07/2015    Priority: Medium     Small, partially reversible inferolateral ischemia. Marked hypertensive response to exercise. Low risk.      Chronic obstructive pulmonary disease ([place]) 07/01/2013    Priority: Medium     PFTs 2015: normal flow volumes with mild obstructive disease by shape of curves and no response to bronchodilator.     Intermittent claudication ([place]) 09/19/2021   Health Maintenance  Topic Date Due   MAMMOGRAM  01/22/2017   INFLUENZA VACCINE  09/10/2021   COVID-19 Vaccine (3 - Pfizer series) 10/05/2021 (Originally 12/06/2019)   DEXA SCAN  09/25/2023   COLONOSCOPY (Pts 45-8yr Insurance coverage will need to be confirmed)  07/24/2024   Pneumonia Vaccine 71 Years old  Completed   Hepatitis C Screening  Completed   Zoster Vaccines- Shingrix  Completed   HPV VACCINES  Aged Out   TETANUS/TDAP  Discontinued   Immunization History  Administered Date(s) Administered   Fluad Quad(high Dose 65+) 11/12/2020   Influenza Split 04/24/2013   Influenza, High Dose Seasonal PF 09/14/2016, 12/18/2017, 10/11/2018   Influenza, Seasonal, Injecte, Preservative Fre 11/24/2015   Influenza,inj,Quad PF,6+ Mos 11/24/2015    Influenza,inj,quad, With Preservative 03/21/2013   PFIZER(Purple Top)SARS-COV-2 Vaccination 09/10/2019, 10/11/2019   PPD Test 04/02/2015   Pneumococcal Conjugate-13 06/24/2017   Pneumococcal Polysaccharide-23 07/03/2014, 11/24/2015   Pneumococcal-Unspecified 07/03/2014, 11/24/2015   Td 08/10/2008   Zoster Recombinat (Shingrix) 08/10/2018, 08/27/2018, 10/11/2018   We updated and reviewed the patient's past history in detail and it is documented below. Allergies: Patient has No Known Allergies. Past Medical History Patient  has a past medical history of Abnormal nuclear stress test (February '13), Arthritis, Chest pain with minimal risk for cardiac etiology (February 2013), COPD (chronic obstructive pulmonary disease) ([place], Depression, Hepatitis C, Hypertension, Osteopenia of spine (05/22/2016), and Right tibial fracture ( ). Past Surgical History Patient  has a past surgical history that includes Tibia fracture surgery (Right, 09/2008); doppler echocardiography (03/26/2011); and NM MYOVIEW LTD (03/27/11). Family History: Patient family history includes Asthma in her mother; Cancer in her mother; Cancer - Prostate in her father. Social History:  Patient  reports that she has been smoking cigarettes. She has a 45.00 pack-year smoking history. She  has never used smokeless tobacco. She reports current alcohol use of about 2.0 standard drinks of alcohol per week. She reports current drug use. Drug: Marijuana.  Review of Systems: Constitutional: negative for fever or malaise Ophthalmic: negative for photophobia, double vision or loss of vision Cardiovascular: negative for chest pain, dyspnea on exertion, or new LE swelling Respiratory: negative for SOB or persistent cough Gastrointestinal: negative for abdominal pain, change in bowel habits or melena Genitourinary: negative for dysuria or gross hematuria, no abnormal uterine bleeding or disharge Musculoskeletal: negative for new gait disturbance  or muscular weakness Integumentary: negative for new or persistent rashes, no breast lumps Neurological: negative for TIA or stroke symptoms Psychiatric: negative for SI or delusions Allergic/Immunologic: negative for hives  Patient Care Team    Relationship Specialty Notifications Start End  Leamon Arnt, MD PCP - General Family Medicine  06/18/17   Arta Silence, MD Consulting Physician Gastroenterology  01/20/18   Gevena Cotton, MD Consulting Physician Ophthalmology  01/20/19   Harriett Sine, MD Consulting Physician Dermatology  01/20/19   Madelin Rear, [facility] Pharmacist Pharmacist  05/30/20    Comment: Phone [phone]    Objective  Vitals: BP 130/72   Pulse 95   Temp 97.6 F (36.4 C)   Ht '5\' 4"'$  (1.626 m)   Wt 163 lb 9.6 oz (74.2 kg)   SpO2 95%   BMI 28.08 kg/m  General:  Well developed, well nourished, no acute distress  Psych:  Alert and orientedx3,normal mood and affect HEENT:  Normocephalic, atraumatic, non-icteric sclera,  supple neck without adenopathy, mass or thyromegaly Cardiovascular:  Normal S1, S2, RRR without gallop, rub or murmur Respiratory:  Good breath sounds bilaterally, CTAB with normal respiratory effort Gastrointestinal: normal bowel sounds, soft, non-tender, no noted masses. No HSM MSK: no deformities, contusions. Joints are without erythema or swelling.  Skin:  Warm, no rashes or suspicious lesions noted Ext: no edema. Decreased pedal pulses bilaterally, +1 inguinal pulses  Commons side effects, risks, benefits, and alternatives for medications and treatment plan prescribed today were discussed, and the patient expressed understanding of the given instructions. Patient is instructed to call or message via MyChart if he/she has any questions or concerns regarding our treatment plan. No barriers to understanding were identified. We discussed Red Flag symptoms and signs in detail. Patient expressed understanding regarding what to do in case of  urgent or emergency type symptoms.  Medication list was reconciled, printed and provided to the patient in AVS. Patient instructions and summary information was reviewed with the patient as documented in the AVS. This note was prepared with assistance of Dragon voice recognition software. Occasional wrong-word or sound-a-like substitutions may have occurred due to the inherent limitations of voice recognition software  This visit occurred during the SARS-CoV-2 public health emergency.  Safety protocols were in place, including screening questions prior to the visit, additional usage of staff PPE, and extensive cleaning of exam room while observing appropriate contact time as indicated for disinfecting solutions.

## 2021-09-19 NOTE — Patient Instructions (Addendum)
Please return in 6 months for hypertension follow up.   I have ordered a mammogram and/or bone density for you as we discussed today: '[x]'$   Mammogram  '[]'$   Bone Density  Please call the office checked below to schedule your appointment:  '[x]'$   [facility]      [place], [place]         '[]'$   [place], [place]  We will get you scheduled for evaluation of the arteries in your legs: please let me know if this does not get done. We will also call you to get your scheduled for annual lung cancer screening.   If you have any questions or concerns, please don't hesitate to send me a message via MyChart or call the office at [phone]. Thank you for visiting with [place] today! It's our pleasure caring for you.

## 2021-09-19 NOTE — Telephone Encounter (Signed)
Pt was seen today and things has been addressed.

## 2021-09-23 ENCOUNTER — Ambulatory Visit (HOSPITAL_COMMUNITY)
Admission: RE | Admit: 2021-09-23 | Discharge: 2021-09-23 | Disposition: A | Discharge status: Home / Self Care | Payer: Medicare Other | Source: Ambulatory Visit | Attending: Surgery | Admitting: Surgery

## 2021-09-23 DIAGNOSIS — I739 Peripheral vascular disease, unspecified: Secondary | ICD-10-CM

## 2021-10-01 ENCOUNTER — Encounter: Payer: Self-pay | Admitting: Family Medicine

## 2021-10-01 DIAGNOSIS — I739 Peripheral vascular disease, unspecified: Secondary | ICD-10-CM

## 2021-10-01 HISTORY — DX: Peripheral vascular disease, unspecified: I73.9

## 2021-10-01 MED ORDER — CILOSTAZOL 100 MG PO TABS: 100.0000 mg | ORAL_TABLET | Freq: Two times a day (BID) | ORAL | 11 refills | Status: DC

## 2021-10-01 NOTE — Addendum Note (Signed)
Addended by: Billey Chang on: 10/01/2021 06:56 PM   Modules accepted: Orders

## 2021-10-02 ENCOUNTER — Other Ambulatory Visit: Payer: Self-pay

## 2021-10-02 DIAGNOSIS — I739 Peripheral vascular disease, unspecified: Secondary | ICD-10-CM

## 2021-10-04 ENCOUNTER — Telehealth: Payer: Self-pay | Admitting: Family Medicine

## 2021-10-04 NOTE — Telephone Encounter (Signed)
Patient states: - She was prescribed pletal 100 mg on 08/22 but is unsure what the medication is specifically for  - She wanted to know more information of what could be going on with her legs.  - Another provider of hers placed "cuffs" on her legs which were providing a certain type of reading - She was informed by this provider that the results would be sent to her PCP for further evaluation

## 2021-10-07 NOTE — Telephone Encounter (Signed)
I have spoke with pt regarding this.

## 2021-10-12 ENCOUNTER — Other Ambulatory Visit: Payer: Self-pay | Admitting: Family Medicine

## 2021-10-12 DIAGNOSIS — E782 Mixed hyperlipidemia: Secondary | ICD-10-CM

## 2021-10-16 ENCOUNTER — Ambulatory Visit
Admission: RE | Admit: 2021-10-16 | Discharge: 2021-10-16 | Disposition: A | Discharge status: Home / Self Care | Payer: Medicare Other | Source: Ambulatory Visit | Attending: Family Medicine | Admitting: Family Medicine

## 2021-10-16 DIAGNOSIS — Z1231 Encounter for screening mammogram for malignant neoplasm of breast: Secondary | ICD-10-CM

## 2021-10-21 ENCOUNTER — Telehealth: Payer: Self-pay | Admitting: Family Medicine

## 2021-10-21 NOTE — Telephone Encounter (Signed)
   LAST APPOINTMENT DATE:   09/19/21 CPE   NEXT APPOINTMENT DATE: N/a   MEDICATION: amLODipine (NORVASC) 10 MG tablet [[id number]]    Is the patient out of medication?  Yes, first missed dose 10/19/21   PHARMACY: [facility] [place], [place] AT [facility] [address] [facility]  [address], [place] [address]  Phone:  [phone]  Fax:  [phone]

## 2021-10-21 NOTE — Progress Notes (Unsigned)
[facility] [place]  ASSESSMENT / PLAN: Christine Hurley is a 71 y.o. female with atherosclerosis of native arteries of bilateral lower extremities causing intermittent claudication.  Patient counseled patients with asymptomatic peripheral arterial disease or claudication have a 1-2% risk of developing chronic limb threatening ischemia, but a 15-30% risk of mortality in the next 5 years. Intervention should only be considered for medically optimized patients with disabling symptoms.   Recommend the following which can slow the progression of atherosclerosis and reduce the risk of major adverse cardiac / limb events:  Complete cessation from all tobacco products. Blood glucose control with goal A1c < 7%. Blood pressure control with goal blood pressure < 140/90 mmHg. Lipid reduction therapy with goal LDL-C <100 mg/dL (<70 if symptomatic from PAD).  Aspirin '81mg'$  PO QD.  Atorvastatin 40-'80mg'$  PO QD (or other "high intensity" statin therapy). Daily walking to and past the point of discomfort. Patient counseled to keep a log of exercise distance. Adequate hydration (at least 2 liters / day) if patient's heart and kidney function is adequate.  We will see the patient again in a year with repeat ankle-brachial index.  If she deteriorates, develops ischemic rest pain, or ischemic ulceration of the foot, she knows to call my office for urgent evaluation.  CHIEF COMPLAINT: Cramping with walking  HISTORY OF PRESENT ILLNESS: Christine Hurley is a 70 y.o. female referred to clinic for evaluation of peripheral arterial disease.  The patient has a fairly classic history of intermittent claudication.  She can walk about 50-100 yards before cramping discomfort develops in her calves.  A brief rest will resolve the symptoms.  The pain does not occur when she is still.  She has no rest pain.  She has no pain in her feet that wakes her up at night.  She has no ulcers about her feet.  VASCULAR  SURGICAL HISTORY: none  VASCULAR RISK FACTORS: Negative history of stroke / transient ischemic attack. Negative history of coronary artery disease.  Negative history of diabetes mellitus.  Positive history of smoking. + actively smoking. Positive history of hypertension.  Negative history of chronic kidney disease.   Positive history of chronic obstructive pulmonary disease.  FUNCTIONAL STATUS: ECOG performance status: (1) Restricted in physically strenuous activity, ambulatory and able to do work of light nature Ambulatory status: Ambulatory within the community with limits  Past Medical History:  Diagnosis Date   Abnormal nuclear stress test February '13   Small, partially reversible inferolateral ischemia. Marked hypertensive response to exercise. Low risk.   Arthritis    Chest pain with minimal risk for cardiac etiology February 2013   Evaluated with echocardiogram and Myoview, do   COPD (chronic obstructive pulmonary disease) (HCC)    Depression    Hepatitis C    Hypertension    Osteopenia of spine 05/22/2016   PAD (peripheral artery disease) ([place]) 10/01/2021   ABI: monophasic and decreased ABIs. Refer to vascular   Right tibial fracture      Past Surgical History:  Procedure Laterality Date   DOPPLER ECHOCARDIOGRAPHY  03/26/2011   LVEF>55% normal LV wall thickness, normal LA, mild aortic sclerosis with trace to mild AI, mild to moderate TR, RVSP of 36mHg, RA pressure about 5 mmHg   NM MYOVIEW LTD  03/27/11   post stress ejection fraction is 72%, abnormal myocardial perfusion study, this is a low risk scan   TIBIA FRACTURE SURGERY Right 09/2008   fracture of right lower leg with open reduction and  internal fixation    Family History  Problem Relation Age of Onset   Asthma Mother    Cancer Mother        Brain   Cancer - Prostate Father     Social History   Socioeconomic History   Marital status: Divorced    Spouse name: Not on file   Number of children: 1    Years of education: Not on file   Highest education level: Not on file  Occupational History   Not on file  Tobacco Use   Smoking status: Every Day    Packs/day: 0.25    Years: 45.00    Total pack years: 11.25    Types: Cigarettes   Smokeless tobacco: Never   Tobacco comments:    2 cigs per day   Vaping Use   Vaping Use: Never used  Substance and Sexual Activity   Alcohol use: Yes    Alcohol/week: 2.0 standard drinks of alcohol    Types: 2 Cans of beer per week    Comment: socially    Drug use: Yes    Types: Marijuana    Comment: 2 days ago    Sexual activity: Yes    Birth control/protection: Post-menopausal  Other Topics Concern   Not on file  Social History Narrative   Not on file   Social Determinants of Health   Financial Resource Strain: Not on file  Food Insecurity: No Food Insecurity (07/24/2020)   Hunger Vital Sign    Worried About Running Out of Food in the Last Year: Never true    Ran Out of Food in the Last Year: Never true  Transportation Needs: Not on file  Physical Activity: Not on file  Stress: Not on file  Social Connections: Not on file  Intimate Partner Violence: Not on file    No Known Allergies  Current Outpatient Medications  Medication Sig Dispense Refill   albuterol (VENTOLIN HFA) 108 (90 Base) MCG/ACT inhaler INHALE 1 TO 2 PUFFS INTO THE LUNGS EVERY 6 HOURS AS NEEDED FOR WHEEZING OR SHORTNESS OF BREATH 6.7 g 2   aspirin EC 81 MG tablet Take 1 tablet (81 mg total) by mouth daily.     atorvastatin (LIPITOR) 10 MG tablet TAKE 1 TABLET(10 MG) BY MOUTH AT BEDTIME 90 tablet 3   cilostazol (PLETAL) 100 MG tablet Take 1 tablet (100 mg total) by mouth 2 (two) times daily. 60 tablet 11   lisinopril-hydrochlorothiazide (ZESTORETIC) 20-12.5 MG tablet Take 2 tablets by mouth daily. 180 tablet 3   Tiotropium Bromide Monohydrate (SPIRIVA RESPIMAT) 2.5 MCG/ACT AERS Inhale 2 puffs into the lungs daily. 4 g 11   No current facility-administered medications  for this visit.    PHYSICAL EXAM Vitals:   10/22/21 0924  BP: (!) 163/101  Pulse: 72  Resp: 20  Temp: 98.7 F (37.1 C)  SpO2: 96%  Weight: 165 lb (74.8 kg)  Height: '5\' 4"'$  (1.626 m)   Constitutional: Well-appearing woman in no acute distress Cardiac: Regular rate and rhythm.  Respiratory:  unlabored. Peripheral vascular: No palpable popliteal pulses.  No palpable pedal pulses.  No ulcers about the feet.  PERTINENT LABORATORY AND RADIOLOGIC DATA  Most recent CBC    Latest Ref Rng & Units 05/14/2020    2:52 PM 01/17/2019   10:20 AM 12/18/2017    3:25 PM  CBC  WBC 4.0 - 10.5 K/uL 6.3  4.5  4.9   Hemoglobin 12.0 - 15.0 g/dL 12.9  13.0  12.4  Hematocrit 36.0 - 46.0 % 37.9  38.0  36.6   Platelets 150.0 - 400.0 K/uL 287.0  309.0  256      Most recent CMP    Latest Ref Rng & Units 05/14/2020    2:52 PM 01/17/2019   10:20 AM 12/18/2017    3:25 PM  CMP  Glucose 70 - 99 mg/dL 99  110  74   BUN 6 - 23 mg/dL '17  21  15   '$ Creatinine 0.40 - 1.20 mg/dL 0.87  0.74  0.73   Sodium 135 - 145 mEq/L 138  138  139   Potassium 3.5 - 5.1 mEq/L 4.1  3.8  3.8   Chloride 96 - 112 mEq/L 103  104  105   CO2 19 - 32 mEq/L '25  25  26   '$ Calcium 8.4 - 10.5 mg/dL 9.7  9.5  9.4   Total Protein 6.0 - 8.3 g/dL 7.5  7.6  7.7   Total Bilirubin 0.2 - 1.2 mg/dL 0.3  0.4  0.4   Alkaline Phos 39 - 117 U/L 56  66    AST 0 - 37 U/L '18  15  17   '$ ALT 0 - 35 U/L '16  11  11     '$ Renal function CrCl cannot be calculated (Patient's most recent lab result is older than the maximum 21 days allowed.).  Hemoglobin A1C (no units)  Date Value  06/18/2017 5.7   Hgb A1c MFr Bld (%)  Date Value  11/23/2015 5.6    LDL Cholesterol  Date Value Ref Range Status  05/14/2020 69 0 - 99 mg/dL Final   Direct LDL  Date Value Ref Range Status  01/17/2019 72.0 mg/dL Final    Comment:    Optimal:  <100 mg/dLNear or Above Optimal:  100-129 mg/dLBorderline High:  130-159 mg/dLHigh:  160-189 mg/dLVery High:  >190 mg/dL      +-------+-----------+-----------+------------+------------+  ABI/TBIToday's ABIToday's TBIPrevious ABIPrevious TBI  +-------+-----------+-----------+------------+------------+  Right  0.64       0.50                                 +-------+-----------+-----------+------------+------------+  Left   0.78       0.47                                 +-------+-----------+-----------+------------+------------+   Yevonne Aline. Stanford Breed, MD [facility] Phone Number: [phone] 10/22/2021 9:47 AM  Total time spent on preparing this encounter including chart review, data review, collecting history, examining the patient, coordinating care for this new patient, 60 minutes.  Portions of this report may have been transcribed using voice recognition software.  Every effort has been made to ensure accuracy; however, inadvertent computerized transcription errors may still be present.

## 2021-10-22 ENCOUNTER — Ambulatory Visit (INDEPENDENT_AMBULATORY_CARE_PROVIDER_SITE_OTHER): Payer: Medicare Other | Admitting: Vascular Surgery

## 2021-10-22 ENCOUNTER — Encounter: Payer: Self-pay | Admitting: Vascular Surgery

## 2021-10-22 VITALS — BP 163/101 | HR 72 | Temp 98.7°F | Resp 20 | Ht 64.0 in | Wt 165.0 lb

## 2021-10-22 DIAGNOSIS — I70213 Atherosclerosis of native arteries of extremities with intermittent claudication, bilateral legs: Secondary | ICD-10-CM

## 2021-10-23 NOTE — Telephone Encounter (Signed)
LVM for pt to cb to the office and let me know whether infact that she is not taking amlodipine or is she taking the medication. Waiting on a response back.   New # [phone]

## 2021-10-25 ENCOUNTER — Other Ambulatory Visit: Payer: Self-pay

## 2021-10-25 DIAGNOSIS — I70213 Atherosclerosis of native arteries of extremities with intermittent claudication, bilateral legs: Secondary | ICD-10-CM

## 2021-10-28 ENCOUNTER — Other Ambulatory Visit: Payer: Self-pay | Admitting: *Deleted

## 2021-10-28 DIAGNOSIS — Z122 Encounter for screening for malignant neoplasm of respiratory organs: Secondary | ICD-10-CM

## 2021-10-28 DIAGNOSIS — Z87891 Personal history of nicotine dependence: Secondary | ICD-10-CM

## 2021-10-28 DIAGNOSIS — F17210 Nicotine dependence, cigarettes, uncomplicated: Secondary | ICD-10-CM

## 2021-11-01 NOTE — Telephone Encounter (Signed)
Informed patient of message below upon her request to refill amlodipine.   Patient states: -She has not been taking amlodipine and has continued with lisonpril-HCTZ - She thought she had to restart medication - She is fine with medication regimen of Lisinopril-HCTZ 20-12.5 2 tabs daily  LAST APPOINTMENT DATE:  09/19/21  NEXT APPOINTMENT DATE: None  MEDICATION: lisinopril-hydrochlorothiazide (ZESTORETIC) 20-12.5 MG tablet  Is the patient out of medication? No  PHARMACY:[facility] [place], [place] AT [facility] [address] [facility]  [place], [place] [address]  Phone:  [phone]  Fax:  [phone]

## 2021-11-18 ENCOUNTER — Ambulatory Visit (INDEPENDENT_AMBULATORY_CARE_PROVIDER_SITE_OTHER): Payer: Medicare Other | Admitting: Pulmonary Disease

## 2021-11-18 ENCOUNTER — Encounter: Payer: Self-pay | Admitting: Pulmonary Disease

## 2021-11-18 DIAGNOSIS — Z122 Encounter for screening for malignant neoplasm of respiratory organs: Secondary | ICD-10-CM

## 2021-11-18 DIAGNOSIS — Z87891 Personal history of nicotine dependence: Secondary | ICD-10-CM | POA: Diagnosis not present

## 2021-11-18 NOTE — Patient Instructions (Signed)
Thank you for participating in the [facility]. It was our pleasure to meet you today. We will call you with the results of your scan within the next few days. Your scan will be assigned a Lung RADS category score by the physicians reading the scans.  This Lung RADS score determines follow up scanning.  See below for description of categories, and follow up screening recommendations. We will be in touch to schedule your follow up screening annually or based on recommendations of our providers. We will fax a copy of your scan results to your Primary Care Physician, or the physician who referred you to the program, to ensure they have the results. Please call the office if you have any questions or concerns regarding your scanning experience or results.  Our office number is [phone]. Please speak with Denise Phelps, RN. , or  Denise Buckner RN, They are  our Lung Cancer Screening RN.'s If They are unavailable when you call, Please leave a message on the voice mail. We will return your call at our earliest convenience.This voice mail is monitored several times a day.  Remember, if your scan is normal, we will scan you annually as long as you continue to meet the criteria for the program. (Age 55-77, Current smoker or smoker who has quit within the last 15 years). If you are a smoker, remember, quitting is the single most powerful action that you can take to decrease your risk of lung cancer and other pulmonary, breathing related problems. We know quitting is hard, and we are here to help.  Please let us know if there is anything we can do to help you meet your goal of quitting. If you are a former smoker, congratulations. We are proud of you! Remain smoke free! Remember you can refer friends or family members through the number above.  We will screen them to make sure they meet criteria for the program. Thank you for helping us take better care of you by  participating in Lung Screening.  You can receive free nicotine replacement therapy ( patches, gum or mints) by calling 1-800-QUIT NOW. Please call so we can get you on the path to becoming  a non-smoker. I know it is hard, but you can do this!  Lung RADS Categories:  Lung RADS 1: no nodules or definitely non-concerning nodules.  Recommendation is for a repeat annual scan in 12 months.  Lung RADS 2:  nodules that are non-concerning in appearance and behavior with a very low likelihood of becoming an active cancer. Recommendation is for a repeat annual scan in 12 months.  Lung RADS 3: nodules that are probably non-concerning , includes nodules with a low likelihood of becoming an active cancer.  Recommendation is for a 6-month repeat screening scan. Often noted after an upper respiratory illness. We will be in touch to make sure you have no questions, and to schedule your 6-month scan.  Lung RADS 4 A: nodules with concerning findings, recommendation is most often for a follow up scan in 3 months or additional testing based on our provider's assessment of the scan. We will be in touch to make sure you have no questions and to schedule the recommended 3 month follow up scan.  Lung RADS 4 B:  indicates findings that are concerning. We will be in touch with you to schedule additional diagnostic testing based on our provider's  assessment of the scan.  Other options for assistance in smoking cessation (   As covered by your insurance benefits)  Hypnosis for smoking cessation  [employer] [phone]  Acupuncture for smoking cessation  [facility] [phone]   

## 2021-11-18 NOTE — Progress Notes (Signed)
Shared Decision Making Visit Lung Cancer Screening Program [id number])   Eligibility: Age 71 y.o. Pack Years Smoking History Calculation - 25, currently smoking 1/4 a day now  (# packs/per year x # years smoked) Recent History of coughing up blood  no Unexplained weight loss? no ( >Than 15 pounds within the last 6 months ) Prior History Lung / other cancer no (Diagnosis within the last 5 years already requiring surveillance chest CT Scans). Smoking Status Current Smoker  Visit Components: Discussion included one or more decision making aids. yes Discussion included risk/benefits of screening. yes Discussion included potential follow up diagnostic testing for abnormal scans. yes Discussion included meaning and risk of over diagnosis. yes Discussion included meaning and risk of False Positives. yes Discussion included meaning of total radiation exposure. yes  Counseling Included: Importance of adherence to annual lung cancer LDCT screening. yes Impact of comorbidities on ability to participate in the program. yes Ability and willingness to under diagnostic treatment. yes  Smoking Cessation Counseling: Current Smokers:  Discussed importance of smoking cessation. yes Information about tobacco cessation classes and interventions provided to patient. yes Patient provided with "ticket" for LDCT Scan. yes Asymptomatic Patient yes  Counseling (Intermediate counseling: > three minutes counseling) [id number] Information about tobacco cessation classes and interventions provided to patient. Yes Patient provided with "ticket" for LDCT Scan. yes Written Order for Lung Cancer Screening with LDCT placed in Epic. Yes (CT Chest Lung Cancer Screening Low Dose W/O CM) [id number] Z12.2-Screening of respiratory organs Z87.891-Personal history of nicotine dependence   Lauraine Rinne, NP

## 2021-11-20 ENCOUNTER — Ambulatory Visit
Admission: RE | Admit: 2021-11-20 | Discharge: 2021-11-20 | Disposition: A | Discharge status: Home / Self Care | Payer: Medicare Other | Source: Ambulatory Visit | Attending: Acute Care | Admitting: Acute Care

## 2021-11-20 DIAGNOSIS — Z87891 Personal history of nicotine dependence: Secondary | ICD-10-CM

## 2021-11-20 DIAGNOSIS — Z122 Encounter for screening for malignant neoplasm of respiratory organs: Secondary | ICD-10-CM

## 2021-11-20 DIAGNOSIS — F17210 Nicotine dependence, cigarettes, uncomplicated: Secondary | ICD-10-CM

## 2021-11-22 ENCOUNTER — Other Ambulatory Visit: Payer: Self-pay

## 2021-11-22 DIAGNOSIS — F17210 Nicotine dependence, cigarettes, uncomplicated: Secondary | ICD-10-CM

## 2021-11-22 DIAGNOSIS — Z87891 Personal history of nicotine dependence: Secondary | ICD-10-CM

## 2021-11-22 DIAGNOSIS — Z122 Encounter for screening for malignant neoplasm of respiratory organs: Secondary | ICD-10-CM

## 2021-12-03 ENCOUNTER — Telehealth: Payer: Self-pay

## 2021-12-03 NOTE — Telephone Encounter (Signed)
Spoke with pt regarding CT results per [place]. CT results show Lung damage from smoking with is COPD. Advised pt to stop smoking or to cut back until she can stop. Pt verbalized understanding .

## 2022-02-19 ENCOUNTER — Other Ambulatory Visit: Payer: Self-pay | Admitting: Family Medicine

## 2022-03-13 ENCOUNTER — Ambulatory Visit (INDEPENDENT_AMBULATORY_CARE_PROVIDER_SITE_OTHER): Payer: 59

## 2022-03-13 VITALS — Wt 165.0 lb

## 2022-03-13 DIAGNOSIS — Z0000 Encounter for general adult medical examination without abnormal findings: Secondary | ICD-10-CM | POA: Diagnosis not present

## 2022-03-13 NOTE — Patient Instructions (Signed)
[name] , Thank you for taking time to come for your Medicare Wellness Visit. I appreciate your ongoing commitment to your health goals. Please review the following plan we discussed and let me know if I can assist you in the future.   These are the goals we discussed:  Goals      Patient Stated     Get legs checked out      Quit Smoking        This is a list of the screening recommended for you and due dates:  Health Maintenance  Topic Date Due   DTaP/Tdap/Td vaccine (2 - Tdap) 08/11/2018   Flu Shot  09/10/2021   COVID-19 Vaccine (3 - 2023-24 season) 10/11/2021   Mammogram  10/17/2022   Medicare Annual Wellness Visit  03/14/2023   DEXA scan (bone density measurement)  09/25/2023   Colon Cancer Screening  07/24/2024   Pneumonia Vaccine  Completed   Hepatitis C Screening: USPSTF Recommendation to screen - Ages 18-79 yo.  Completed   Zoster (Shingles) Vaccine  Completed   HPV Vaccine  Aged Out    Advanced directives: Advance directive discussed with you today. Even though you declined this today please call our office should you change your mind and we can give you the proper paperwork for you to fill out.  Conditions/risks identified: get legs back in shape by walking   Next appointment: Follow up in one year for your annual wellness visit    Preventive Care 65 Years and Older, Female Preventive care refers to lifestyle choices and visits with your health care provider that can promote health and wellness. What does preventive care include? A yearly physical exam. This is also called an annual well check. Dental exams once or twice a year. Routine eye exams. Ask your health care provider how often you should have your eyes checked. Personal lifestyle choices, including: Daily care of your teeth and gums. Regular physical activity. Eating a healthy diet. Avoiding tobacco and drug use. Limiting alcohol use. Practicing safe sex. Taking low-dose aspirin every  day. Taking vitamin and mineral supplements as recommended by your health care provider. What happens during an annual well check? The services and screenings done by your health care provider during your annual well check will depend on your age, overall health, lifestyle risk factors, and family history of disease. Counseling  Your health care provider may ask you questions about your: Alcohol use. Tobacco use. Drug use. Emotional well-being. Home and relationship well-being. Sexual activity. Eating habits. History of falls. Memory and ability to understand (cognition). Work and work Statistician. Reproductive health. Screening  You may have the following tests or measurements: Height, weight, and BMI. Blood pressure. Lipid and cholesterol levels. These may be checked every 5 years, or more frequently if you are over 56 years old. Skin check. Lung cancer screening. You may have this screening every year starting at age 35 if you have a 30-pack-year history of smoking and currently smoke or have quit within the past 15 years. Fecal occult blood test (FOBT) of the stool. You may have this test every year starting at age 71. Flexible sigmoidoscopy or colonoscopy. You may have a sigmoidoscopy every 5 years or a colonoscopy every 10 years starting at age 41. Hepatitis C blood test. Hepatitis B blood test. Sexually transmitted disease (STD) testing. Diabetes screening. This is done by checking your blood sugar (glucose) after you have not eaten for a while (fasting). You may have this done every  1-3 years. Bone density scan. This is done to screen for osteoporosis. You may have this done starting at age 60. Mammogram. This may be done every 1-2 years. Talk to your health care provider about how often you should have regular mammograms. Talk with your health care provider about your test results, treatment options, and if necessary, the need for more tests. Vaccines  Your health care  provider may recommend certain vaccines, such as: Influenza vaccine. This is recommended every year. Tetanus, diphtheria, and acellular pertussis (Tdap, Td) vaccine. You may need a Td booster every 10 years. Zoster vaccine. You may need this after age 79. Pneumococcal 13-valent conjugate (PCV13) vaccine. One dose is recommended after age 77. Pneumococcal polysaccharide (PPSV23) vaccine. One dose is recommended after age 18. Talk to your health care provider about which screenings and vaccines you need and how often you need them. This information is not intended to replace advice given to you by your health care provider. Make sure you discuss any questions you have with your health care provider. Document Released: 02/23/2015 Document Revised: 10/17/2015 Document Reviewed: 11/28/2014 Elsevier Interactive Patient Education  2017 [place] Prevention in the Home Falls can cause injuries. They can happen to people of all ages. There are many things you can do to make your home safe and to help prevent falls. What can I do on the outside of my home? Regularly fix the edges of walkways and driveways and fix any cracks. Remove anything that might make you trip as you walk through a door, such as a raised step or threshold. Trim any bushes or trees on the path to your home. Use bright outdoor lighting. Clear any walking paths of anything that might make someone trip, such as rocks or tools. Regularly check to see if handrails are loose or broken. Make sure that both sides of any steps have handrails. Any raised decks and porches should have guardrails on the edges. Have any leaves, snow, or ice cleared regularly. Use sand or salt on walking paths during winter. Clean up any spills in your garage right away. This includes oil or grease spills. What can I do in the bathroom? Use night lights. Install grab bars by the toilet and in the tub and shower. Do not use towel bars as grab  bars. Use non-skid mats or decals in the tub or shower. If you need to sit down in the shower, use a plastic, non-slip stool. Keep the floor dry. Clean up any water that spills on the floor as soon as it happens. Remove soap buildup in the tub or shower regularly. Attach bath mats securely with double-sided non-slip rug tape. Do not have throw rugs and other things on the floor that can make you trip. What can I do in the bedroom? Use night lights. Make sure that you have a light by your bed that is easy to reach. Do not use any sheets or blankets that are too big for your bed. They should not hang down onto the floor. Have a firm chair that has side arms. You can use this for support while you get dressed. Do not have throw rugs and other things on the floor that can make you trip. What can I do in the kitchen? Clean up any spills right away. Avoid walking on wet floors. Keep items that you use a lot in easy-to-reach places. If you need to reach something above you, use a strong step stool that has a  grab bar. Keep electrical cords out of the way. Do not use floor polish or wax that makes floors slippery. If you must use wax, use non-skid floor wax. Do not have throw rugs and other things on the floor that can make you trip. What can I do with my stairs? Do not leave any items on the stairs. Make sure that there are handrails on both sides of the stairs and use them. Fix handrails that are broken or loose. Make sure that handrails are as long as the stairways. Check any carpeting to make sure that it is firmly attached to the stairs. Fix any carpet that is loose or worn. Avoid having throw rugs at the top or bottom of the stairs. If you do have throw rugs, attach them to the floor with carpet tape. Make sure that you have a light switch at the top of the stairs and the bottom of the stairs. If you do not have them, ask someone to add them for you. What else can I do to help prevent  falls? Wear shoes that: Do not have high heels. Have rubber bottoms. Are comfortable and fit you well. Are closed at the toe. Do not wear sandals. If you use a stepladder: Make sure that it is fully opened. Do not climb a closed stepladder. Make sure that both sides of the stepladder are locked into place. Ask someone to hold it for you, if possible. Clearly mark and make sure that you can see: Any grab bars or handrails. First and last steps. Where the edge of each step is. Use tools that help you move around (mobility aids) if they are needed. These include: Canes. Walkers. Scooters. Crutches. Turn on the lights when you go into a dark area. Replace any light bulbs as soon as they burn out. Set up your furniture so you have a clear path. Avoid moving your furniture around. If any of your floors are uneven, fix them. If there are any pets around you, be aware of where they are. Review your medicines with your doctor. Some medicines can make you feel dizzy. This can increase your chance of falling. Ask your doctor what other things that you can do to help prevent falls. This information is not intended to replace advice given to you by your health care provider. Make sure you discuss any questions you have with your health care provider. Document Released: 11/23/2008 Document Revised: 07/05/2015 Document Reviewed: 03/03/2014 Elsevier Interactive Patient Education  2017 [employer].

## 2022-03-13 NOTE — Progress Notes (Signed)
I connected with  Kathe Becton on 03/13/22 by a audio enabled telemedicine application and verified that I am speaking with the correct person using two identifiers.  Patient Location: Home  Provider Location: Office/Clinic  I discussed the limitations of evaluation and management by telemedicine. The patient expressed understanding and agreed to proceed.   Subjective:   Christine Hurley is a 72 y.o. female who presents for Medicare Annual (Subsequent) preventive examination.  Review of Systems     Cardiac Risk Factors include: advanced age (>38mn, >>81women);dyslipidemia;hypertension;smoking/ tobacco exposure     Objective:    Today's Vitals   03/13/22 0902  Weight: 165 lb (74.8 kg)   Body mass index is 28.32 kg/m.     03/13/2022    9:08 AM 01/20/2019   11:57 AM 04/23/2017   10:51 AM 04/22/2016    8:56 PM 11/24/2015    7:50 AM 02/09/2014    1:56 AM  Advanced Directives  Does Patient Have a Medical Advance Directive? No No No No No No  Would patient like information on creating a medical advance directive? No - Patient declined Yes (MAU/Ambulatory/Procedural Areas - Information given)   No - patient declined information     Current Medications (verified) Outpatient Encounter Medications as of 03/13/2022  Medication Sig   albuterol (VENTOLIN HFA) 108 (90 Base) MCG/ACT inhaler INHALE 1 TO 2 PUFFS INTO THE LUNGS EVERY 6 HOURS AS NEEDED FOR WHEEZING OR SHORTNESS OF BREATH   aspirin EC 81 MG tablet Take 1 tablet (81 mg total) by mouth daily.   atorvastatin (LIPITOR) 10 MG tablet TAKE 1 TABLET(10 MG) BY MOUTH AT BEDTIME   lisinopril-hydrochlorothiazide (ZESTORETIC) 20-12.5 MG tablet Take 2 tablets by mouth daily.   SPIRIVA RESPIMAT 2.5 MCG/ACT AERS INHALE 2 PUFFS INTO THE LUNGS DAILY   [DISCONTINUED] cilostazol (PLETAL) 100 MG tablet Take 1 tablet (100 mg total) by mouth 2 (two) times daily.   No facility-administered encounter medications on file as of 03/13/2022.     Allergies (verified) Patient has no known allergies.   History: Past Medical History:  Diagnosis Date   Abnormal nuclear stress test February '13   Small, partially reversible inferolateral ischemia. Marked hypertensive response to exercise. Low risk.   Arthritis    Chest pain with minimal risk for cardiac etiology February 2013   Evaluated with echocardiogram and Myoview, do   COPD (chronic obstructive pulmonary disease) (HCC)    Depression    Hepatitis C    Hypertension    Osteopenia of spine 05/22/2016   PAD (peripheral artery disease) ([facility] 10/01/2021   ABI: monophasic and decreased ABIs. Refer to vascular   Right tibial fracture     Past Surgical History:  Procedure Laterality Date   DOPPLER ECHOCARDIOGRAPHY  03/26/2011   LVEF>55% normal LV wall thickness, normal LA, mild aortic sclerosis with trace to mild AI, mild to moderate TR, RVSP of 317mg, RA pressure about 5 mmHg   NM MYOVIEW LTD  03/27/11   post stress ejection fraction is 72%, abnormal myocardial perfusion study, this is a low risk scan   TIBIA FRACTURE SURGERY Right 09/2008   fracture of right lower leg with open reduction and internal fixation   Family History  Problem Relation Age of Onset   Asthma Mother    Cancer Mother        Brain   Cancer - Prostate Father    Social History   Socioeconomic History   Marital status: Divorced    Spouse name: Not on  file   Number of children: 1   Years of education: Not on file   Highest education level: Not on file  Occupational History   Not on file  Tobacco Use   Smoking status: Every Day    Packs/day: 0.25    Years: 45.00    Total pack years: 11.25    Types: Cigarettes   Smokeless tobacco: Never   Tobacco comments:    2 cigs per day   Vaping Use   Vaping Use: Never used  Substance and Sexual Activity   Alcohol use: Yes    Alcohol/week: 2.0 standard drinks of alcohol    Types: 2 Cans of beer per week    Comment: socially    Drug use: Not  Currently    Types: Marijuana    Comment: 2 days ago    Sexual activity: Yes    Birth control/protection: Post-menopausal  Other Topics Concern   Not on file  Social History Narrative   Not on file   Social Determinants of Health   Financial Resource Strain: Low Risk  (03/13/2022)   Overall Financial Resource Strain (CARDIA)    Difficulty of Paying Living Expenses: Not hard at all  Food Insecurity: No Food Insecurity (03/13/2022)   Hunger Vital Sign    Worried About Running Out of Food in the Last Year: Never true    Ran Out of Food in the Last Year: Never true  Transportation Needs: No Transportation Needs (03/13/2022)   PRAPARE - Hydrologist (Medical): No    Lack of Transportation (Non-Medical): No  Physical Activity: Inactive (03/13/2022)   Exercise Vital Sign    Days of Exercise per Week: 0 days    Minutes of Exercise per Session: 0 min  Stress: No Stress Concern Present (03/13/2022)   [place]    Feeling of Stress : Not at all  Social Connections: Moderately Isolated (03/13/2022)   Social Connection and Isolation Panel [NHANES]    Frequency of Communication with Friends and Family: More than three times a week    Frequency of Social Gatherings with Friends and Family: More than three times a week    Attends Religious Services: More than 4 times per year    Active Member of [employer] or Organizations: No    Attends Music therapist: Never    Marital Status: Divorced    Tobacco Counseling Ready to quit: Not Answered Counseling given: Not Answered Tobacco comments: 2 cigs per day    Clinical Intake:  Pre-visit preparation completed: Yes  Pain : No/denies pain     BMI - recorded: 28.32 Nutritional Status: BMI 25 -29 Overweight Nutritional Risks: None Diabetes: No  How often do you need to have someone help you when you read instructions, pamphlets, or other written  materials from your doctor or pharmacy?: 1 - Never  Diabetic?no  Interpreter Needed?: No  Information entered by :: Charlott Rakes, LPN   Activities of Daily Living    03/13/2022    9:10 AM  In your present state of health, do you have any difficulty performing the following activities:  Hearing? 0  Vision? 0  Difficulty concentrating or making decisions? 0  Walking or climbing stairs? 0  Dressing or bathing? 0  Doing errands, shopping? 0  Preparing Food and eating ? N  Using the Toilet? N  In the past six months, have you accidently leaked urine? N  Do you  have problems with loss of bowel control? N  Managing your Medications? N  Managing your Finances? N  Housekeeping or managing your Housekeeping? N    Patient Care Team: Leamon Arnt, MD as PCP - General (Family Medicine) Arta Silence, MD as Consulting Physician (Gastroenterology) Gevena Cotton, MD as Consulting Physician (Ophthalmology) Harriett Sine, MD as Consulting Physician (Dermatology) Madelin Rear, [facility] (Inactive) as Pharmacist (Pharmacist)  Indicate any recent Medical Services you may have received from other than [facility] providers in the past year (date may be approximate).     Assessment:   This is a routine wellness examination for [place].  Hearing/Vision screen Hearing Screening - Comments:: Pt stated slight loss  Vision Screening - Comments:: Pt encouraged to follow up with provider   Dietary issues and exercise activities discussed: Current Exercise Habits: The patient does not participate in regular exercise at present   Goals Addressed             This Visit's Progress    Patient Stated       Get legs checked out        Depression Screen    03/13/2022    9:06 AM 09/19/2021    9:02 AM 05/14/2020    2:36 PM 01/20/2019   11:58 AM 07/27/2018    8:48 AM 06/18/2017    1:06 PM 06/18/2017    1:05 PM  PHQ 2/9 Scores  PHQ - 2 Score 0 0 0 0 0 0 0  PHQ- 9 Score       0    Fall Risk     03/13/2022    9:10 AM 09/19/2021    9:01 AM 05/14/2020    2:36 PM 01/20/2019   11:58 AM 07/27/2018    8:41 AM  [place] in the past year? 0 0 0 0 0  Number falls in past yr: 0 0 0  0  Injury with Fall? 0 0 0 0 0  Risk for fall due to : Impaired vision No Fall Risks     Follow up Falls prevention discussed Falls evaluation completed  Falls evaluation completed;Education provided;Falls prevention discussed Falls evaluation completed    FALL RISK PREVENTION PERTAINING TO THE HOME:  Any stairs in or around the home? Yes  If so, are there any without handrails? No  Home free of loose throw rugs in walkways, pet beds, electrical cords, etc? Yes  Adequate lighting in your home to reduce risk of falls? Yes   ASSISTIVE DEVICES UTILIZED TO PREVENT FALLS:  Life alert? Yes  Use of a cane, walker or w/c? Yes  Grab bars in the bathroom? Yes  Shower chair or bench in shower? Yes  Elevated toilet seat or a handicapped toilet? Yes   TIMED UP AND GO:  Was the test performed? No .   Cognitive Function:        03/13/2022    9:11 AM  6CIT Screen  What Year? 0 points  What month? 0 points  What time? 0 points  Count back from 20 0 points  Months in reverse 0 points  Repeat phrase 0 points  Total Score 0 points    Immunizations Immunization History  Administered Date(s) Administered   Fluad Quad(high Dose 65+) 11/12/2020   Influenza Split 04/24/2013   Influenza, High Dose Seasonal PF 09/14/2016, 12/18/2017, 10/11/2018   Influenza, Seasonal, Injecte, Preservative Fre 11/24/2015   Influenza,inj,Quad PF,6+ Mos 11/24/2015   Influenza,inj,quad, With Preservative 03/21/2013   PFIZER(Purple Top)SARS-COV-2 Vaccination  09/10/2019, 10/11/2019   PPD Test 04/02/2015   Pneumococcal Conjugate-13 06/24/2017   Pneumococcal Polysaccharide-23 07/03/2014, 11/24/2015   Pneumococcal-Unspecified 07/03/2014, 11/24/2015   Td 08/10/2008   Zoster Recombinat (Shingrix) 08/10/2018, 08/27/2018,  10/11/2018    TDAP status: Due, Education has been provided regarding the importance of this vaccine. Advised may receive this vaccine at local pharmacy or Health Dept. Aware to provide a copy of the vaccination record if obtained from local pharmacy or Health Dept. Verbalized acceptance and understanding.  Flu Vaccine status: Due, Education has been provided regarding the importance of this vaccine. Advised may receive this vaccine at local pharmacy or Health Dept. Aware to provide a copy of the vaccination record if obtained from local pharmacy or Health Dept. Verbalized acceptance and understanding.  Pneumococcal vaccine status: Up to date  Covid-19 vaccine status: Completed vaccines  Qualifies for Shingles Vaccine? Yes   Zostavax completed Yes   Shingrix Completed?: Yes  Screening Tests Health Maintenance  Topic Date Due   DTaP/Tdap/Td (2 - Tdap) 08/11/2018   INFLUENZA VACCINE  09/10/2021   COVID-19 Vaccine (3 - 2023-24 season) 10/11/2021   MAMMOGRAM  10/17/2022   Medicare Annual Wellness (AWV)  03/14/2023   DEXA SCAN  09/25/2023   COLONOSCOPY (Pts 45-57yr Insurance coverage will need to be confirmed)  07/24/2024   Pneumonia Vaccine 72 Years old  Completed   Hepatitis C Screening  Completed   Zoster Vaccines- Shingrix  Completed   HPV VACCINES  Aged Out    Health Maintenance  Health Maintenance Due  Topic Date Due   DTaP/Tdap/Td (2 - Tdap) 08/11/2018   INFLUENZA VACCINE  09/10/2021   COVID-19 Vaccine (3 - 2023-24 season) 10/11/2021    Colorectal cancer screening: Type of screening: Colonoscopy. Completed 07/25/14. Repeat every 10 years  Mammogram status: Completed 10/16/21. Repeat every year  Bone Density status: Completed 09/24/20. Results reflect: Bone density results: OSTEOPENIA. Repeat every 3 years.  Lung Cancer Screening: (Low Dose CT Chest recommended if Age 72-80years, 30 pack-year currently smoking OR have quit w/in 15years.) does qualify.   Lung Cancer  Screening Referral: last scheduled 11/20/21  Additional Screening:  Hepatitis C Screening:  Completed 09/19/21  Vision Screening: Recommended annual ophthalmology exams for early detection of glaucoma and other disorders of the eye. Is the patient up to date with their annual eye exam?  Yes  Who is the provider or what is the name of the office in which the patient attends annual eye exams? Unsure of providers name  If pt is not established with a provider, would they like to be referred to a provider to establish care? No .   Dental Screening: Recommended annual dental exams for proper oral hygiene  Community Resource Referral / Chronic Care Management: CRR required this visit?  No   CCM required this visit?  No      Plan:     I have personally reviewed and noted the following in the patient's chart:   Medical and social history Use of alcohol, tobacco or illicit drugs  Current medications and supplements including opioid prescriptions. Patient is not currently taking opioid prescriptions. Functional ability and status Nutritional status Physical activity Advanced directives List of other physicians Hospitalizations, surgeries, and ER visits in previous 12 months Vitals Screenings to include cognitive, depression, and falls Referrals and appointments  In addition, I have reviewed and discussed with patient certain preventive protocols, quality metrics, and best practice recommendations. A written personalized care plan for preventive services as well as  general preventive health recommendations were provided to patient.     Willette Brace, LPN   07/13/2295   Nurse Notes: none

## 2022-06-19 ENCOUNTER — Telehealth: Payer: Self-pay | Admitting: Family Medicine

## 2022-06-19 NOTE — Telephone Encounter (Signed)
Spoke with pt to let her know that she will need to come in for a Bp check before andy will do the placard card. Pt verbalized understanding

## 2022-06-19 NOTE — Telephone Encounter (Signed)
Pts car placard is expiring soon and she needs it renewed, she needs 2 and she wanted to know if they can be done for a longer date. Please advise.

## 2022-07-18 ENCOUNTER — Encounter (HOSPITAL_COMMUNITY): Payer: Self-pay

## 2022-07-18 ENCOUNTER — Emergency Department (HOSPITAL_COMMUNITY): Payer: Medicare HMO

## 2022-07-18 ENCOUNTER — Other Ambulatory Visit: Payer: Self-pay

## 2022-07-18 ENCOUNTER — Inpatient Hospital Stay (HOSPITAL_COMMUNITY)
Admission: EM | Admit: 2022-07-18 | Discharge: 2022-07-21 | DRG: 391 | Disposition: A | Discharge status: Home / Self Care | Payer: Medicare HMO | Attending: Internal Medicine | Admitting: Internal Medicine

## 2022-07-18 DIAGNOSIS — J410 Simple chronic bronchitis: Secondary | ICD-10-CM

## 2022-07-18 DIAGNOSIS — I10 Essential (primary) hypertension: Secondary | ICD-10-CM | POA: Diagnosis present

## 2022-07-18 DIAGNOSIS — Z8619 Personal history of other infectious and parasitic diseases: Secondary | ICD-10-CM | POA: Diagnosis not present

## 2022-07-18 DIAGNOSIS — I739 Peripheral vascular disease, unspecified: Secondary | ICD-10-CM | POA: Diagnosis present

## 2022-07-18 DIAGNOSIS — K529 Noninfective gastroenteritis and colitis, unspecified: Secondary | ICD-10-CM | POA: Diagnosis present

## 2022-07-18 DIAGNOSIS — F1010 Alcohol abuse, uncomplicated: Secondary | ICD-10-CM | POA: Diagnosis present

## 2022-07-18 DIAGNOSIS — E876 Hypokalemia: Secondary | ICD-10-CM | POA: Diagnosis present

## 2022-07-18 DIAGNOSIS — R000 Tachycardia, unspecified: Secondary | ICD-10-CM | POA: Diagnosis not present

## 2022-07-18 DIAGNOSIS — J449 Chronic obstructive pulmonary disease, unspecified: Secondary | ICD-10-CM | POA: Diagnosis present

## 2022-07-18 DIAGNOSIS — F17210 Nicotine dependence, cigarettes, uncomplicated: Secondary | ICD-10-CM | POA: Diagnosis present

## 2022-07-18 DIAGNOSIS — Z7951 Long term (current) use of inhaled steroids: Secondary | ICD-10-CM

## 2022-07-18 DIAGNOSIS — K769 Liver disease, unspecified: Secondary | ICD-10-CM | POA: Diagnosis present

## 2022-07-18 DIAGNOSIS — M8588 Other specified disorders of bone density and structure, other site: Secondary | ICD-10-CM | POA: Diagnosis present

## 2022-07-18 DIAGNOSIS — E161 Other hypoglycemia: Secondary | ICD-10-CM | POA: Diagnosis not present

## 2022-07-18 DIAGNOSIS — Z79899 Other long term (current) drug therapy: Secondary | ICD-10-CM

## 2022-07-18 DIAGNOSIS — R1031 Right lower quadrant pain: Secondary | ICD-10-CM | POA: Diagnosis not present

## 2022-07-18 DIAGNOSIS — E278 Other specified disorders of adrenal gland: Secondary | ICD-10-CM | POA: Diagnosis present

## 2022-07-18 DIAGNOSIS — N281 Cyst of kidney, acquired: Secondary | ICD-10-CM | POA: Diagnosis not present

## 2022-07-18 DIAGNOSIS — E785 Hyperlipidemia, unspecified: Secondary | ICD-10-CM | POA: Diagnosis present

## 2022-07-18 DIAGNOSIS — R160 Hepatomegaly, not elsewhere classified: Secondary | ICD-10-CM | POA: Diagnosis present

## 2022-07-18 DIAGNOSIS — I81 Portal vein thrombosis: Secondary | ICD-10-CM | POA: Diagnosis present

## 2022-07-18 DIAGNOSIS — R519 Headache, unspecified: Secondary | ICD-10-CM | POA: Diagnosis not present

## 2022-07-18 DIAGNOSIS — I160 Hypertensive urgency: Secondary | ICD-10-CM | POA: Diagnosis present

## 2022-07-18 DIAGNOSIS — R42 Dizziness and giddiness: Secondary | ICD-10-CM | POA: Diagnosis not present

## 2022-07-18 DIAGNOSIS — K6389 Other specified diseases of intestine: Secondary | ICD-10-CM | POA: Diagnosis not present

## 2022-07-18 DIAGNOSIS — R1030 Lower abdominal pain, unspecified: Principal | ICD-10-CM

## 2022-07-18 DIAGNOSIS — Z7982 Long term (current) use of aspirin: Secondary | ICD-10-CM

## 2022-07-18 DIAGNOSIS — R112 Nausea with vomiting, unspecified: Secondary | ICD-10-CM | POA: Diagnosis not present

## 2022-07-18 DIAGNOSIS — F1210 Cannabis abuse, uncomplicated: Secondary | ICD-10-CM | POA: Diagnosis present

## 2022-07-18 DIAGNOSIS — K7689 Other specified diseases of liver: Secondary | ICD-10-CM | POA: Diagnosis not present

## 2022-07-18 DIAGNOSIS — I2510 Atherosclerotic heart disease of native coronary artery without angina pectoris: Secondary | ICD-10-CM | POA: Diagnosis not present

## 2022-07-18 DIAGNOSIS — R1032 Left lower quadrant pain: Secondary | ICD-10-CM | POA: Diagnosis not present

## 2022-07-18 LAB — COMPREHENSIVE METABOLIC PANEL
ALT: 91 U/L — ABNORMAL HIGH (ref 0–44)
AST: 62 U/L — ABNORMAL HIGH (ref 15–41)
Albumin: 3.8 g/dL (ref 3.5–5.0)
Alkaline Phosphatase: 274 U/L — ABNORMAL HIGH (ref 38–126)
Anion gap: 14 (ref 5–15)
BUN: 13 mg/dL (ref 8–23)
CO2: 22 mmol/L (ref 22–32)
Calcium: 9.2 mg/dL (ref 8.9–10.3)
Chloride: 100 mmol/L (ref 98–111)
Creatinine, Ser: 0.78 mg/dL (ref 0.44–1.00)
GFR, Estimated: >60 mL/min (ref >60)
Glucose, Bld: 152 mg/dL — ABNORMAL HIGH (ref 70–99)
Potassium: 3.6 mmol/L (ref 3.5–5.1)
Sodium: 136 mmol/L (ref 135–145)
Total Bilirubin: 1.1 mg/dL (ref 0.3–1.2)
Total Protein: 8.2 g/dL — ABNORMAL HIGH (ref 6.5–8.1)

## 2022-07-18 LAB — URINALYSIS, ROUTINE W REFLEX MICROSCOPIC
Bacteria, UA: NONE SEEN
Bilirubin Urine: NEGATIVE
Glucose, UA: NEGATIVE mg/dL
Hgb urine dipstick: NEGATIVE
Ketones, ur: 20 mg/dL — AB
Leukocytes,Ua: NEGATIVE
Nitrite: NEGATIVE
Protein, ur: 30 mg/dL — AB
Specific Gravity, Urine: 1.012 (ref 1.005–1.030)
pH: 7 (ref 5.0–8.0)

## 2022-07-18 LAB — CBC WITH DIFFERENTIAL/PLATELET
Abs Immature Granulocytes: 0.01 10*3/uL (ref 0.00–0.07)
Basophils Absolute: 0 10*3/uL (ref 0.0–0.1)
Basophils Relative: 0 %
Eosinophils Absolute: 0 10*3/uL (ref 0.0–0.5)
Eosinophils Relative: 0 %
HCT: 40.1 % (ref 36.0–46.0)
Hemoglobin: 13.3 g/dL (ref 12.0–15.0)
Immature Granulocytes: 0 %
Lymphocytes Relative: 8 %
Lymphs Abs: 0.5 10*3/uL — ABNORMAL LOW (ref 0.7–4.0)
MCH: 29.1 pg (ref 26.0–34.0)
MCHC: 33.2 g/dL (ref 30.0–36.0)
MCV: 87.7 fL (ref 80.0–100.0)
Monocytes Absolute: 0.1 10*3/uL (ref 0.1–1.0)
Monocytes Relative: 2 %
Neutro Abs: 5.5 10*3/uL (ref 1.7–7.7)
Neutrophils Relative %: 90 %
Platelets: 200 10*3/uL (ref 150–400)
RBC: 4.57 MIL/uL (ref 3.87–5.11)
RDW: 14.6 % (ref 11.5–15.5)
WBC: 6.2 10*3/uL (ref 4.0–10.5)
nRBC: 0 % (ref 0.0–0.2)

## 2022-07-18 LAB — LIPASE, BLOOD: Lipase: 37 U/L (ref 11–51)

## 2022-07-18 LAB — TROPONIN I (HIGH SENSITIVITY): Troponin I (High Sensitivity): 5 ng/L (ref <18)

## 2022-07-18 MED ORDER — HYDRALAZINE HCL 20 MG/ML IJ SOLN
20.0000 mg | Freq: Once | INTRAMUSCULAR | Status: AC
  Administered 2022-07-18: 20 mg via INTRAVENOUS
  Filled 2022-07-18: qty 1

## 2022-07-18 MED ORDER — IOHEXOL 350 MG/ML SOLN
75.0000 mL | Freq: Once | INTRAVENOUS | Status: AC | PRN
  Administered 2022-07-18: 75 mL via INTRAVENOUS

## 2022-07-18 MED ORDER — MORPHINE SULFATE (PF) 4 MG/ML IV SOLN
4.0000 mg | Freq: Once | INTRAVENOUS | Status: AC
  Administered 2022-07-18: 4 mg via INTRAVENOUS
  Filled 2022-07-18: qty 1

## 2022-07-18 MED ORDER — MIDAZOLAM HCL 2 MG/2ML IJ SOLN
2.0000 mg | Freq: Once | INTRAMUSCULAR | Status: AC
  Administered 2022-07-18: 2 mg via INTRAVENOUS
  Filled 2022-07-18: qty 2

## 2022-07-18 MED ORDER — IOHEXOL 350 MG/ML SOLN
100.0000 mL | Freq: Once | INTRAVENOUS | Status: AC | PRN
  Administered 2022-07-18: 100 mL via INTRAVENOUS

## 2022-07-18 MED ORDER — SODIUM CHLORIDE 0.9 % IV SOLN
1.0000 g | Freq: Once | INTRAVENOUS | Status: AC
  Administered 2022-07-18: 1 g via INTRAVENOUS
  Filled 2022-07-18: qty 10

## 2022-07-18 MED ORDER — SODIUM CHLORIDE 0.9 % IV BOLUS
1000.0000 mL | Freq: Once | INTRAVENOUS | Status: AC
  Administered 2022-07-18: 1000 mL via INTRAVENOUS

## 2022-07-18 MED ORDER — METRONIDAZOLE 500 MG/100ML IV SOLN
500.0000 mg | Freq: Once | INTRAVENOUS | Status: AC
  Administered 2022-07-19: 500 mg via INTRAVENOUS
  Filled 2022-07-18: qty 100

## 2022-07-18 MED ORDER — ONDANSETRON HCL 4 MG/2ML IJ SOLN
4.0000 mg | Freq: Once | INTRAMUSCULAR | Status: AC
  Administered 2022-07-18: 4 mg via INTRAVENOUS
  Filled 2022-07-18: qty 2

## 2022-07-18 NOTE — Progress Notes (Signed)
ANTICOAGULATION CONSULT NOTE - Initial Consult  Pharmacy Consult for heparin Indication:  portal vein thrombosis  No Known Allergies  Patient Measurements: Height: 5\' 6"  (167.6 cm) Weight: 70.3 kg (155 lb) IBW/kg (Calculated) : 59.3  Vital Signs: Temp: 98.4 F (36.9 C) (06/07 2228) Temp Source: Oral (06/07 2228) BP: 206/85 (06/07 2330) Pulse Rate: 109 (06/07 2330)  Labs: Recent Labs    07/18/22 2042  HGB 13.3  HCT 40.1  PLT 200  CREATININE 0.78  TROPONINIHS 5    Estimated Creatinine Clearance: 59.5 mL/min (by C-G formula based on SCr of 0.78 mg/dL).   Medical History: Past Medical History:  Diagnosis Date   Abnormal nuclear stress test February '13   Small, partially reversible inferolateral ischemia. Marked hypertensive response to exercise. Low risk.   Arthritis    Chest pain with minimal risk for cardiac etiology February 2013   Evaluated with echocardiogram and Myoview, do   COPD (chronic obstructive pulmonary disease) (HCC)    Depression    Hepatitis C    Hypertension    Osteopenia of spine 05/22/2016   PAD (peripheral artery disease) (HCC) 10/01/2021   ABI: monophasic and decreased ABIs. Refer to vascular   Right tibial fracture      Assessment: 72yo female c/o abdominal pain, vomiting, and dizziness x2d, CT reveals portal vein thrombosis >> to begin heparin.  Goal of Therapy:  Heparin level 0.3-0.7 units/ml Monitor platelets by anticoagulation protocol: Yes   Plan:  Heparin 3000 units IV bolus x1 followed by infusion at 1200 units/hr. Monitor heparin levels and CBC.  Vernard Gambles, PharmD, BCPS  07/18/2022,11:57 PM

## 2022-07-18 NOTE — ED Notes (Signed)
Patient transported to CT 

## 2022-07-18 NOTE — ED Notes (Signed)
Patient necklace and earrings given to son.

## 2022-07-18 NOTE — ED Triage Notes (Signed)
Patient BIB EMS from home due to vomiting and dizziness. Patient states this has been going on for 2 days and has not been able to keep food and medicine down. States BP has been elevated due to such. Patient  3 dark stools, reports no blood. Patient received 4mg  zofran IV by EMS and did not help nausea. Patient A&Ox4.

## 2022-07-18 NOTE — ED Notes (Signed)
Bolus stopped per provider

## 2022-07-18 NOTE — ED Provider Notes (Signed)
[place] EMERGENCY DEPARTMENT AT [facility] Provider Note   CSN: [id number] Arrival date & time: 07/18/22  1951     History  Chief Complaint  Patient presents with   Emesis    Christine Hurley is a 72 y.o. female with a past medical history of COPD, hypertension, presents today for evaluation of nausea and vomiting and abdominal pain.  She reports that she has had nausea, vomiting and diarrhea in the last 2 days.  She also reports abdominal pain.  Pain is located in her lower abdomen, constant.  She denies any fever, chest pain, shortness of breath, urinary symptoms, blood in the stool or urine.  Denies any history of abdominal surgery.   Emesis     Past Medical History:  Diagnosis Date   Abnormal nuclear stress test February '13   Small, partially reversible inferolateral ischemia. Marked hypertensive response to exercise. Low risk.   Arthritis    Chest pain with minimal risk for cardiac etiology February 2013   Evaluated with echocardiogram and Myoview, do   COPD (chronic obstructive pulmonary disease) (HCC)    Depression    Hepatitis C    Hypertension    Osteopenia of spine 05/22/2016   PAD (peripheral artery disease) (HCC) 10/01/2021   ABI: monophasic and decreased ABIs. Refer to vascular   Right tibial fracture     Past Surgical History:  Procedure Laterality Date   DOPPLER ECHOCARDIOGRAPHY  03/26/2011   LVEF>55% normal LV wall thickness, normal LA, mild aortic sclerosis with trace to mild AI, mild to moderate TR, RVSP of , RA pressure about 5 mmHg   NM MYOVIEW LTD  03/27/11   post stress ejection fraction is 72%, abnormal myocardial perfusion study, this is a low risk scan   TIBIA FRACTURE SURGERY Right 09/2008   fracture of right lower leg with open reduction and internal fixation     Home Medications Prior to Admission medications   Medication Sig Start Date End Date Taking? Authorizing Provider  albuterol (VENTOLIN HFA) 108 (90 Base) MCG/ACT  inhaler INHALE 1 TO 2 PUFFS INTO THE LUNGS EVERY 6 HOURS AS NEEDED FOR WHEEZING OR SHORTNESS OF BREATH 07/10/20   Willow Ora, MD  aspirin EC 81 MG tablet Take 1 tablet (81 mg total) by mouth daily. 09/19/21   Willow Ora, MD  atorvastatin (LIPITOR) 10 MG tablet TAKE 1 TABLET(10 MG) BY MOUTH AT BEDTIME 10/15/21   Willow Ora, MD  lisinopril-hydrochlorothiazide (ZESTORETIC) 20-12.5 MG tablet Take 2 tablets by mouth daily. 09/19/21   Willow Ora, MD  SPIRIVA RESPIMAT 2.5 MCG/ACT AERS INHALE 2 PUFFS INTO THE LUNGS DAILY 02/19/22   Willow Ora, MD      Allergies    Patient has no known allergies.    Review of Systems   Review of Systems  Gastrointestinal:  Positive for vomiting.    Physical Exam Updated Vital Signs BP (!) 238/108 (BP Location: Right Arm)   Pulse 88   Temp 98.2 F (36.8 C) (Oral)   Resp (!) 25   Ht 5\' 6"  (1.676 m)   Wt 70.3 kg   SpO2 100%   BMI 25.02 kg/m  Physical Exam Vitals and nursing note reviewed.  Constitutional:      Appearance: Normal appearance.  HENT:     Head: Normocephalic and atraumatic.     Mouth/Throat:     Mouth: Mucous membranes are moist.  Eyes:     General: No scleral icterus. Cardiovascular:  Rate and Rhythm: Normal rate and regular rhythm.     Pulses: Normal pulses.     Heart sounds: Normal heart sounds.  Pulmonary:     Effort: Pulmonary effort is normal.     Breath sounds: Normal breath sounds.  Abdominal:     General: Abdomen is flat.     Palpations: Abdomen is soft.     Tenderness: There is abdominal tenderness in the right lower quadrant, suprapubic area and left lower quadrant.  Musculoskeletal:        General: No deformity.  Skin:    General: Skin is warm.     Findings: No rash.  Neurological:     General: No focal deficit present.     Mental Status: She is alert.  Psychiatric:        Mood and Affect: Mood normal.     ED Results / Procedures / Treatments   Labs (all labs ordered are listed, but  only abnormal results are displayed) Labs Reviewed  COMPREHENSIVE METABOLIC PANEL  LIPASE, BLOOD  CBC WITH DIFFERENTIAL/PLATELET  URINALYSIS, ROUTINE W REFLEX MICROSCOPIC    EKG None  Radiology No results found.  Procedures Procedures    Medications Ordered in ED Medications  morphine (PF) 4 MG/ML injection 4 mg (has no administration in time range)  ondansetron (ZOFRAN) injection 4 mg (has no administration in time range)  sodium chloride 0.9 % bolus 1,000 mL (has no administration in time range)    ED Course/ Medical Decision Making/ A&P                             Medical Decision Making Amount and/or Complexity of Data Reviewed Labs: ordered. Radiology: ordered.  Risk Prescription drug management.   This patient presents to the ED for abdominal pain, nausea, vomiting, emesis, this involves an extensive number of treatment options, and is a complaint that carries with a high risk of complications and morbidity.  The differential diagnosis includes small bowel obstruction, acute gastroenteritis, appendicitis, gallbladder/biliary, pancreatitis, peptic ulcer disease, perforation, increased ICP meningitis, vertigo ACS/MI DKA, EtOH intoxication, cannabinoid hyperemesis.  This is not an exhaustive list.  Lab tests: I ordered and personally interpreted labs.  The pertinent results include: WBC unremarkable. Hbg unremarkable. Platelets unremarkable. Electrolytes unremarkable. BUN, creatinine unremarkable.  Urinalysis unremarkable.  AST 62.  ALT 91, alk phos 274.  Imaging studies: I ordered imaging studies. I personally reviewed, interpreted imaging and agree with the radiologist's interpretations. The results include: CT abdomen pelvis ordered and pending.  Problem list/ ED course/ Critical interventions/ Medical management: HPI: See above Vital signs significant for blood pressure 244/108.  Otherwise normal range and stable throughout visit. Laboratory/imaging studies  significant for: See above. On physical examination, patient is afebrile and appears in no acute distress.  There was tenderness to palpation to right lower quadrant and left lower quadrant.  CBC with no leukocytosis or anemia.  CMP with no acute electrolyte abnormality or AKI.  Given symptoms of nausea vomiting and diarrhea, likely gastroenteritis.  Cannot rule out other injury abdominal emergency at this point including cholecystitis, appendicitis, SBO, diverticulitis however I think unlikely.  CT abdomen pelvis ordered and pending.  Due to nausea and vomiting patient has not been able to take her antihypertensive medications for 2 days.  Her blood pressure today was elevated in the 240s.  Given a dose of hydralazine here.  Will hold off on IV fluid.  Given Zofran for nausea and  morphine for pain. I have reviewed the patient home medicines and have made adjustments as needed.  Cardiac monitoring/EKG: The patient was maintained on a cardiac monitor.  I personally reviewed and interpreted the cardiac monitor which showed an underlying rhythm of: sinus rhythm.  Additional history obtained: External records from outside source obtained and reviewed including: Chart review including previous notes, labs, imaging.  Consultations obtained:  Disposition Patient care signed out at shift change to the attending Dr. Renaye Rakers with pending CT abdomen pelvis. This chart was dictated using voice recognition software.  Despite best efforts to proofread,  errors can occur which can change the documentation meaning.          Final Clinical Impression(s) / ED Diagnoses Final diagnoses:  Lower abdominal pain    Rx / DC Orders ED Discharge Orders     None         Jeanelle Malling, [place] 07/18/22 2215    Terald Sleeper, MD 07/19/22 [phone]

## 2022-07-19 ENCOUNTER — Inpatient Hospital Stay (HOSPITAL_COMMUNITY): Payer: Medicare HMO

## 2022-07-19 ENCOUNTER — Encounter (HOSPITAL_COMMUNITY): Payer: Self-pay | Admitting: Family Medicine

## 2022-07-19 DIAGNOSIS — K529 Noninfective gastroenteritis and colitis, unspecified: Secondary | ICD-10-CM | POA: Diagnosis present

## 2022-07-19 DIAGNOSIS — E785 Hyperlipidemia, unspecified: Secondary | ICD-10-CM | POA: Diagnosis present

## 2022-07-19 DIAGNOSIS — J410 Simple chronic bronchitis: Secondary | ICD-10-CM | POA: Diagnosis not present

## 2022-07-19 DIAGNOSIS — M8588 Other specified disorders of bone density and structure, other site: Secondary | ICD-10-CM | POA: Diagnosis present

## 2022-07-19 DIAGNOSIS — E876 Hypokalemia: Secondary | ICD-10-CM | POA: Diagnosis present

## 2022-07-19 DIAGNOSIS — F1010 Alcohol abuse, uncomplicated: Secondary | ICD-10-CM | POA: Diagnosis present

## 2022-07-19 DIAGNOSIS — K769 Liver disease, unspecified: Secondary | ICD-10-CM | POA: Diagnosis present

## 2022-07-19 DIAGNOSIS — J449 Chronic obstructive pulmonary disease, unspecified: Secondary | ICD-10-CM | POA: Diagnosis present

## 2022-07-19 DIAGNOSIS — Z7951 Long term (current) use of inhaled steroids: Secondary | ICD-10-CM | POA: Diagnosis not present

## 2022-07-19 DIAGNOSIS — Z8619 Personal history of other infectious and parasitic diseases: Secondary | ICD-10-CM | POA: Diagnosis not present

## 2022-07-19 DIAGNOSIS — I81 Portal vein thrombosis: Secondary | ICD-10-CM | POA: Diagnosis not present

## 2022-07-19 DIAGNOSIS — R160 Hepatomegaly, not elsewhere classified: Secondary | ICD-10-CM | POA: Diagnosis present

## 2022-07-19 DIAGNOSIS — I10 Essential (primary) hypertension: Secondary | ICD-10-CM | POA: Diagnosis present

## 2022-07-19 DIAGNOSIS — F1210 Cannabis abuse, uncomplicated: Secondary | ICD-10-CM | POA: Diagnosis present

## 2022-07-19 DIAGNOSIS — F17210 Nicotine dependence, cigarettes, uncomplicated: Secondary | ICD-10-CM | POA: Diagnosis present

## 2022-07-19 DIAGNOSIS — I160 Hypertensive urgency: Secondary | ICD-10-CM | POA: Diagnosis present

## 2022-07-19 DIAGNOSIS — I739 Peripheral vascular disease, unspecified: Secondary | ICD-10-CM | POA: Diagnosis not present

## 2022-07-19 DIAGNOSIS — E278 Other specified disorders of adrenal gland: Secondary | ICD-10-CM | POA: Diagnosis present

## 2022-07-19 DIAGNOSIS — Z79899 Other long term (current) drug therapy: Secondary | ICD-10-CM | POA: Diagnosis not present

## 2022-07-19 DIAGNOSIS — Z7982 Long term (current) use of aspirin: Secondary | ICD-10-CM | POA: Diagnosis not present

## 2022-07-19 LAB — COMPREHENSIVE METABOLIC PANEL
ALT: 86 U/L — ABNORMAL HIGH (ref 0–44)
AST: 53 U/L — ABNORMAL HIGH (ref 15–41)
Albumin: 3.7 g/dL (ref 3.5–5.0)
Alkaline Phosphatase: 242 U/L — ABNORMAL HIGH (ref 38–126)
Anion gap: 17 — ABNORMAL HIGH (ref 5–15)
BUN: 9 mg/dL (ref 8–23)
CO2: 17 mmol/L — ABNORMAL LOW (ref 22–32)
Calcium: 9.1 mg/dL (ref 8.9–10.3)
Chloride: 99 mmol/L (ref 98–111)
Creatinine, Ser: 0.74 mg/dL (ref 0.44–1.00)
GFR, Estimated: >60 mL/min (ref >60)
Glucose, Bld: 157 mg/dL — ABNORMAL HIGH (ref 70–99)
Potassium: 2.9 mmol/L — ABNORMAL LOW (ref 3.5–5.1)
Sodium: 133 mmol/L — ABNORMAL LOW (ref 135–145)
Total Bilirubin: 1 mg/dL (ref 0.3–1.2)
Total Protein: 8 g/dL (ref 6.5–8.1)

## 2022-07-19 LAB — GASTROINTESTINAL PANEL BY PCR, STOOL (REPLACES STOOL CULTURE)

## 2022-07-19 LAB — PROTIME-INR
INR: 1.1 (ref 0.8–1.2)
Prothrombin Time: 13.9 seconds (ref 11.4–15.2)

## 2022-07-19 LAB — C DIFFICILE QUICK SCREEN W PCR REFLEX
C Diff antigen: NEGATIVE
C Diff interpretation: NOT DETECTED
C Diff toxin: NEGATIVE

## 2022-07-19 LAB — HEPATITIS PANEL, ACUTE
HCV Ab: REACTIVE — AB
Hep A IgM: NONREACTIVE
Hep B C IgM: NONREACTIVE
Hepatitis B Surface Ag: NONREACTIVE

## 2022-07-19 LAB — TROPONIN I (HIGH SENSITIVITY): Troponin I (High Sensitivity): 4 ng/L (ref <18)

## 2022-07-19 LAB — MAGNESIUM: Magnesium: 1.8 mg/dL (ref 1.7–2.4)

## 2022-07-19 LAB — HEPARIN LEVEL (UNFRACTIONATED)
Heparin Unfractionated: 0.69 IU/mL (ref 0.30–0.70)
Heparin Unfractionated: 0.56 IU/mL (ref 0.30–0.70)

## 2022-07-19 MED ORDER — ACETAMINOPHEN 325 MG PO TABS: 650.0000 mg | ORAL_TABLET | Freq: Four times a day (QID) | ORAL | Status: DC | PRN

## 2022-07-19 MED ORDER — HEPARIN BOLUS VIA INFUSION
3000.0000 [IU] | Freq: Once | INTRAVENOUS | Status: AC
  Administered 2022-07-19: 3000 [IU] via INTRAVENOUS
  Filled 2022-07-19: qty 3000

## 2022-07-19 MED ORDER — ATORVASTATIN CALCIUM 10 MG PO TABS
10.0000 mg | ORAL_TABLET | Freq: Every day | ORAL | Status: DC
  Administered 2022-07-19 – 2022-07-21 (×3): 10 mg via ORAL
  Filled 2022-07-19 (×3): qty 1

## 2022-07-19 MED ORDER — LABETALOL HCL 5 MG/ML IV SOLN
10.0000 mg | INTRAVENOUS | Status: DC | PRN
  Administered 2022-07-19 – 2022-07-21 (×2): 10 mg via INTRAVENOUS
  Filled 2022-07-19 (×2): qty 4

## 2022-07-19 MED ORDER — LORAZEPAM 2 MG/ML IJ SOLN: 1.0000 mg | Freq: Once | INTRAMUSCULAR | Status: DC | PRN

## 2022-07-19 MED ORDER — ASPIRIN 81 MG PO TBEC
81.0000 mg | DELAYED_RELEASE_TABLET | Freq: Every day | ORAL | Status: DC
  Administered 2022-07-19: 81 mg via ORAL
  Filled 2022-07-19: qty 1

## 2022-07-19 MED ORDER — POTASSIUM CHLORIDE 10 MEQ/100ML IV SOLN
10.0000 meq | INTRAVENOUS | Status: AC
  Administered 2022-07-19 (×4): 10 meq via INTRAVENOUS
  Filled 2022-07-19 (×4): qty 100

## 2022-07-19 MED ORDER — FENTANYL CITRATE PF 50 MCG/ML IJ SOSY: 25.0000 ug | PREFILLED_SYRINGE | INTRAMUSCULAR | Status: DC | PRN

## 2022-07-19 MED ORDER — SODIUM CHLORIDE 0.9% FLUSH
3.0000 mL | Freq: Two times a day (BID) | INTRAVENOUS | Status: DC
  Administered 2022-07-19 – 2022-07-21 (×5): 3 mL via INTRAVENOUS

## 2022-07-19 MED ORDER — METRONIDAZOLE 500 MG/100ML IV SOLN
500.0000 mg | Freq: Two times a day (BID) | INTRAVENOUS | Status: DC
  Administered 2022-07-19 – 2022-07-21 (×5): 500 mg via INTRAVENOUS
  Filled 2022-07-19 (×5): qty 100

## 2022-07-19 MED ORDER — PROCHLORPERAZINE EDISYLATE 10 MG/2ML IJ SOLN
5.0000 mg | INTRAMUSCULAR | Status: DC | PRN
  Administered 2022-07-19 – 2022-07-20 (×2): 5 mg via INTRAVENOUS
  Filled 2022-07-19 (×2): qty 2

## 2022-07-19 MED ORDER — ALBUTEROL SULFATE (2.5 MG/3ML) 0.083% IN NEBU: 2.5000 mg | INHALATION_SOLUTION | RESPIRATORY_TRACT | Status: DC | PRN

## 2022-07-19 MED ORDER — TIOTROPIUM BROMIDE MONOHYDRATE 2.5 MCG/ACT IN AERS: 2.0000 | INHALATION_SPRAY | Freq: Every day | RESPIRATORY_TRACT | Status: DC

## 2022-07-19 MED ORDER — OXYCODONE HCL 5 MG PO TABS: 5.0000 mg | ORAL_TABLET | Freq: Four times a day (QID) | ORAL | Status: DC | PRN

## 2022-07-19 MED ORDER — HEPARIN (PORCINE) 25000 UT/250ML-% IV SOLN
1150.0000 [IU]/h | INTRAVENOUS | Status: DC
  Administered 2022-07-19: 1200 [IU]/h via INTRAVENOUS
  Administered 2022-07-20 (×2): 1150 [IU]/h via INTRAVENOUS
  Filled 2022-07-19 (×4): qty 250

## 2022-07-19 MED ORDER — SODIUM CHLORIDE 0.9 % IV SOLN
2.0000 g | INTRAVENOUS | Status: DC
  Administered 2022-07-19 – 2022-07-21 (×3): 2 g via INTRAVENOUS
  Filled 2022-07-19 (×3): qty 20

## 2022-07-19 MED ORDER — LISINOPRIL 20 MG PO TABS
20.0000 mg | ORAL_TABLET | Freq: Every day | ORAL | Status: DC
  Administered 2022-07-19 – 2022-07-21 (×3): 20 mg via ORAL
  Filled 2022-07-19 (×3): qty 1

## 2022-07-19 MED ORDER — UMECLIDINIUM BROMIDE 62.5 MCG/ACT IN AEPB
1.0000 | INHALATION_SPRAY | Freq: Every day | RESPIRATORY_TRACT | Status: DC
  Administered 2022-07-20 – 2022-07-21 (×2): 1 via RESPIRATORY_TRACT
  Filled 2022-07-19: qty 7

## 2022-07-19 MED ORDER — HYDROMORPHONE HCL 1 MG/ML IJ SOLN: 0.5000 mg | INTRAMUSCULAR | Status: DC | PRN

## 2022-07-19 MED ORDER — GADOBUTROL 1 MMOL/ML IV SOLN
7.0000 mL | Freq: Once | INTRAVENOUS | Status: AC | PRN
  Administered 2022-07-19: 7 mL via INTRAVENOUS

## 2022-07-19 MED ORDER — ACETAMINOPHEN 650 MG RE SUPP: 650.0000 mg | Freq: Four times a day (QID) | RECTAL | Status: DC | PRN

## 2022-07-19 MED ORDER — LORAZEPAM 1 MG PO TABS
1.0000 mg | ORAL_TABLET | Freq: Once | ORAL | Status: AC | PRN
  Administered 2022-07-19: 1 mg via ORAL
  Filled 2022-07-19: qty 1

## 2022-07-19 NOTE — Progress Notes (Signed)
ANTICOAGULATION CONSULT NOTE - Initial Consult  Pharmacy Consult for heparin Indication:  portal vein thrombosis  No Known Allergies  Patient Measurements: Height: 5\' 6"  (167.6 cm) Weight: 70.3 kg (155 lb) IBW/kg (Calculated) : 59.3  Vital Signs: Temp: 98.2 F (36.8 C) (06/08 0600) Temp Source: Oral (06/08 0600) BP: 178/96 (06/08 0730) Pulse Rate: 96 (06/08 0730)  Labs: Recent Labs    07/18/22 2042 07/19/22 0029 07/19/22 0225 07/19/22 0813  HGB 13.3  --   --   --   HCT 40.1  --   --   --   PLT 200  --   --   --   HEPARINUNFRC  --   --   --  0.69  CREATININE 0.78  --  0.74  --   TROPONINIHS 5 4  --   --      Estimated Creatinine Clearance: 59.5 mL/min (by C-G formula based on SCr of 0.74 mg/dL).   Medical History: Past Medical History:  Diagnosis Date   Abnormal nuclear stress test February '13   Small, partially reversible inferolateral ischemia. Marked hypertensive response to exercise. Low risk.   Arthritis    Chest pain with minimal risk for cardiac etiology February 2013   Evaluated with echocardiogram and Myoview, do   COPD (chronic obstructive pulmonary disease) (HCC)    Depression    Hepatitis C    Hypertension    Osteopenia of spine 05/22/2016   PAD (peripheral artery disease) (HCC) 10/01/2021   ABI: monophasic and decreased ABIs. Refer to vascular   Right tibial fracture      Assessment: 72yo female c/o abdominal pain, vomiting, and dizziness x2d, CT reveals portal vein thrombosis >> to begin heparin. No anticoagulants reported PTA or in fill hx.   HL =0.69 - therapeutic but at upper limit No CBC this AM  (CBC 6/7 PM WNL)  Goal of Therapy:  Heparin level 0.3-0.7 units/ml Monitor platelets by anticoagulation protocol: Yes   Plan:  Decrease heparin slightly to 1150 units/hr. Repeat HL in 8hr at 1600  Monitor heparin levels and CBC. F/u s/sx bleeding and transition to enteral therapies as appropriate  Heparin drip turned off for MRI  ~0930  Calton Dach, PharmD Clinical Pharmacist 07/19/2022 9:29 AM

## 2022-07-19 NOTE — H&P (Signed)
History and Physical    Christine Hurley ZOX:[id number] DOB: 04-Nov-1950 DOA: 07/18/2022  PCP: Willow Ora, MD   Patient coming from: Home   Chief Complaint: Abdominal pain, N/V/D  HPI: Christine Hurley is a 72 y.o. female with medical history significant for COPD, hypertension, PAD, and hepatitis C which she states was treated effectively roughly 10 years ago, presenting to the emergency department with abdominal pain, nausea, vomiting, and diarrhea.  Patient reports that she has had increased fatigue over the past 2 weeks and then developed pain across the lower abdomen with recurrent bouts of nausea, nonbloody vomiting, and diarrhea.  She denies any fevers or chills associated with this and has not noticed any GI bleeding.  She reports a history of excessive alcohol use but now drinks only 1 beer approximately every other day.  ED Course: Upon arrival to the ED, patient is found to be afebrile and saturating well on room air with elevated heart rate and elevated blood pressure.  EKG demonstrates sinus tachycardia 310 and PVC.  Head CT is negative for acute intracranial abnormality.  CTA of the of the chest, abdomen, and pelvis is concerning for multiple liver lesions suspicious for metastatic disease, portal vein thrombosis, colonic wall thickening with fat stranding, and high-grade stenosis of the bilateral superficial femoral arteries.  Labs were most notable for alkaline phosphatase 274, AST 62, ALT 91, normal renal function, normal WBC, and normal hemoglobin.  Patient was started on IV heparin in the ED and treated with 1 L NS, 2 doses IV hydralazine, morphine, Versed, Zofran, Rocephin, and Flagyl.   She became acutely agitated in ED, was given Versed, and is now calm and cooperative.   Review of Systems:  All other systems reviewed and apart from HPI, are negative.  Past Medical History:  Diagnosis Date   Abnormal nuclear stress test February '13   Small, partially reversible  inferolateral ischemia. Marked hypertensive response to exercise. Low risk.   Arthritis    Chest pain with minimal risk for cardiac etiology February 2013   Evaluated with echocardiogram and Myoview, do   COPD (chronic obstructive pulmonary disease) (HCC)    Depression    Hepatitis C    Hypertension    Osteopenia of spine 05/22/2016   PAD (peripheral artery disease) (HCC) 10/01/2021   ABI: monophasic and decreased ABIs. Refer to vascular   Right tibial fracture      Past Surgical History:  Procedure Laterality Date   DOPPLER ECHOCARDIOGRAPHY  03/26/2011   LVEF>55% normal LV wall thickness, normal LA, mild aortic sclerosis with trace to mild AI, mild to moderate TR, RVSP of , RA pressure about 5 mmHg   NM MYOVIEW LTD  03/27/11   post stress ejection fraction is 72%, abnormal myocardial perfusion study, this is a low risk scan   TIBIA FRACTURE SURGERY Right 09/2008   fracture of right lower leg with open reduction and internal fixation    Social History:   reports that she has been smoking cigarettes. She has a 11.25 pack-year smoking history. She has never used smokeless tobacco. She reports current alcohol use of about 2.0 standard drinks of alcohol per week. She reports that she does not currently use drugs after having used the following drugs: Marijuana.  No Known Allergies  Family History  Problem Relation Age of Onset   Asthma Mother    Cancer Mother        Brain   Cancer - Prostate Father      Prior  to Admission medications   Medication Sig Start Date End Date Taking? Authorizing Provider  albuterol (VENTOLIN HFA) 108 (90 Base) MCG/ACT inhaler INHALE 1 TO 2 PUFFS INTO THE LUNGS EVERY 6 HOURS AS NEEDED FOR WHEEZING OR SHORTNESS OF BREATH 07/10/20   Willow Ora, MD  aspirin EC 81 MG tablet Take 1 tablet (81 mg total) by mouth daily. 09/19/21   Willow Ora, MD  atorvastatin (LIPITOR) 10 MG tablet TAKE 1 TABLET(10 MG) BY MOUTH AT BEDTIME 10/15/21   Willow Ora,  MD  lisinopril-hydrochlorothiazide (ZESTORETIC) 20-12.5 MG tablet Take 2 tablets by mouth daily. 09/19/21   Willow Ora, MD  SPIRIVA RESPIMAT 2.5 MCG/ACT AERS INHALE 2 PUFFS INTO THE LUNGS DAILY 02/19/22   Willow Ora, MD    Physical Exam: Vitals:   07/18/22 2245 07/18/22 2300 07/18/22 2330 07/18/22 2345  BP: (!) 184/98 (!) 156/98 (!) 206/85 (!) 145/86  Pulse:   (!) 109 (!) 110  Resp: (!) 23 (!) 25 (!) 22 20  Temp:      TempSrc:      SpO2:   99% 98%  Weight:      Height:         Constitutional: NAD, no pallor or diaphoresis   Eyes: PERTLA, lids and conjunctivae normal ENMT: Mucous membranes are moist. Posterior pharynx clear of any exudate or lesions.   Neck: supple, no masses  Respiratory: no wheezing, no crackles. No accessory muscle use.  Cardiovascular: S1 & S2 heard, regular rate and rhythm. No extremity edema.  Abdomen: Soft, no distension, no rebound pain or guarding. Bowel sounds active.  Musculoskeletal: no clubbing / cyanosis. No joint deformity upper and lower extremities.   Skin: no significant rashes, lesions, ulcers. Warm, dry, well-perfused. Neurologic: CN 2-12 grossly intact. Moving all extremities. Alert and oriented.  Psychiatric: Calm. Cooperative.    Labs and Imaging on Admission: I have personally reviewed following labs and imaging studies  CBC: Recent Labs  Lab 07/18/22 2042  WBC 6.2  NEUTROABS 5.5  HGB 13.3  HCT 40.1  MCV 87.7  PLT 200   Basic Metabolic Panel: Recent Labs  Lab 07/18/22 2042  NA 136  K 3.6  CL 100  CO2 22  GLUCOSE 152*  BUN 13  CREATININE 0.78  CALCIUM 9.2   GFR: Estimated Creatinine Clearance: 59.5 mL/min (by C-G formula based on SCr of 0.78 mg/dL). Liver Function Tests: Recent Labs  Lab 07/18/22 2042  AST 62*  ALT 91*  ALKPHOS 274*  BILITOT 1.1  PROT 8.2*  ALBUMIN 3.8   Recent Labs  Lab 07/18/22 2042  LIPASE 37   No results for input(s): "AMMONIA" in the last 168 hours. Coagulation  Profile: No results for input(s): "INR", "PROTIME" in the last 168 hours. Cardiac Enzymes: No results for input(s): "CKTOTAL", "CKMB", "CKMBINDEX", "TROPONINI" in the last 168 hours. BNP (last 3 results) No results for input(s): "PROBNP" in the last 8760 hours. HbA1C: No results for input(s): "HGBA1C" in the last 72 hours. CBG: No results for input(s): "GLUCAP" in the last 168 hours. Lipid Profile: No results for input(s): "CHOL", "HDL", "LDLCALC", "TRIG", "CHOLHDL", "LDLDIRECT" in the last 72 hours. Thyroid Function Tests: No results for input(s): "TSH", "T4TOTAL", "FREET4", "T3FREE", "THYROIDAB" in the last 72 hours. Anemia Panel: No results for input(s): "VITAMINB12", "FOLATE", "FERRITIN", "TIBC", "IRON", "RETICCTPCT" in the last 72 hours. Urine analysis:    Component Value Date/Time   COLORURINE STRAW (A) 07/18/2022 2042   APPEARANCEUR CLEAR 07/18/2022 2042  LABSPEC 1.012 07/18/2022 2042   PHURINE 7.0 07/18/2022 2042   GLUCOSEU NEGATIVE 07/18/2022 2042   HGBUR NEGATIVE 07/18/2022 2042   HGBUR negative 11/06/2009 0000   BILIRUBINUR NEGATIVE 07/18/2022 2042   KETONESUR 20 (A) 07/18/2022 2042   PROTEINUR 30 (A) 07/18/2022 2042   UROBILINOGEN 2.0 11/06/2009 0000   NITRITE NEGATIVE 07/18/2022 2042   LEUKOCYTESUR NEGATIVE 07/18/2022 2042   Sepsis Labs: @LABRCNTIP (procalcitonin:4,lacticidven:4) )No results found for this or any previous visit (from the past 240 hour(s)).   Radiological Exams on Admission: CT Angio Chest/Abd/Pel for Dissection W and/or Wo Contrast  Result Date: 07/19/2022 CLINICAL DATA:  Acute aortic syndrome suspected. EXAM: CT ANGIOGRAPHY CHEST, ABDOMEN AND PELVIS TECHNIQUE: Non-contrast CT of the chest was initially obtained. Multidetector CT imaging through the chest, abdomen and pelvis was performed using the standard protocol during bolus administration of intravenous contrast. Multiplanar reconstructed images and MIPs were obtained and reviewed to evaluate  the vascular anatomy. RADIATION DOSE REDUCTION: This exam was performed according to the departmental dose-optimization program which includes automated exposure control, adjustment of the mA and/or kV according to patient size and/or use of iterative reconstruction technique. CONTRAST:  OMNIPAQUE IOHEXOL 350 MG/ML SOLN COMPARISON:  11/20/2021, 08/17/2022. FINDINGS: CTA CHEST FINDINGS Cardiovascular: The heart is normal in size and there is a trace pericardial effusion. Scattered coronary artery calcifications are noted. There is atherosclerotic calcification of the aorta without evidence of aneurysm or dissection. The pulmonary trunk is normal in caliber. Mediastinum/Nodes: No mediastinal, hilar, or axillary lymphadenopathy. A 1 cm hypodensity is noted in the right lobe of the thyroid gland. No follow-up imaging is recommended. The trachea and esophagus are within normal limits. Lungs/Pleura: Centrilobular emphysematous changes are present in the lungs. Atelectasis is present bilaterally. No effusion or pneumothorax. Musculoskeletal: Degenerative changes are present in the thoracic spine. No acute or suspicious osseous abnormality. Review of the MIP images confirms the above findings. CTA ABDOMEN AND PELVIS FINDINGS VASCULAR Aorta: Normal caliber aorta without aneurysm, dissection, vasculitis or significant stenosis. Aortic atherosclerosis. Celiac: Patent without evidence of aneurysm, dissection, vasculitis or significant stenosis. SMA: Patent without evidence of aneurysm, dissection, vasculitis or significant stenosis. Renals: Both renal arteries are patent without evidence of aneurysm, dissection, vasculitis, fibromuscular dysplasia or significant stenosis. IMA: Patent without evidence of aneurysm, dissection, vasculitis or significant stenosis. Inflow: The common iliac and external iliac and internal iliac arteries are patent. The common femoral and deep femoral arteries are patent bilaterally. There are  findings suspicious for high-grade stenosis/occlusion of the proximal superficial femoral arteries bilaterally. Veins: Thrombus is noted in the portal vein with extension into the hepatic veins information multiple collateral vessels at the porta hepatis and gallbladder fossa suggesting collateralization. There are findings suspicious of thrombus in the distal superior mesenteric vein in the right lower quadrant. The splenic vein appears patent. Review of the MIP images confirms the above findings. NON-VASCULAR Hepatobiliary: Multiple hypodense and hypervascular regions are noted in the liver. No biliary ductal dilatation or stones in the gallbladder. Pancreas: Unremarkable. No pancreatic ductal dilatation or surrounding inflammatory changes. Spleen: Normal in size without focal abnormality. Adrenals/Urinary Tract: There is nodular thickening of the left adrenal gland measuring 1.3 cm. The right adrenal gland is within normal limits. The kidneys enhance symmetrically. Subcentimeter hypodensities are present in the kidneys bilaterally, likely cysts. No renal calculus or hydronephrosis. The bladder is unremarkable. Stomach/Bowel: There is a small hiatal hernia. There is questionable mild thickening of the gastric antrum. Appendix appears normal. No obstruction, free air, or  pneumatosis. Scattered areas of colonic wall thickening are noted and most pronounced at the splenic flexure and hepatic flexure with surrounding fat stranding. Lymphatic: No abdominal or pelvic lymphadenopathy by size criteria. Reproductive: Status post hysterectomy. No adnexal masses. Other: Small amount of free fluid is noted in the pelvis. Musculoskeletal: Degenerative changes in the lumbar spine. No acute or suspicious osseous abnormality. Review of the MIP images confirms the above findings. IMPRESSION: 1. No evidence of aortic aneurysm or dissection. 2. Findings suggestive of high-grade stenosis/occlusion of the proximal superficial femoral  arteries bilaterally. 3. Multiple hypodense and hypervascular masses in the liver, concerning for metastatic disease. MRI with contrast is recommended for further evaluation. 4. Portal venous thrombus extending into the hepatic veins in the liver with cavernous transformation. There is questionable thrombus involving the distal branch of the superior mesenteric vein in the right lower quadrant. 5. Multifocal colonic wall thickening with surrounding fat stranding, most pronounced at the hepatic and splenic flexures suggesting colitis. The possibility of underlying neoplasm can not be excluded. Colonoscopy is recommended for further evaluation on follow-up. 6. Indeterminate left adrenal nodule. Adrenal follow-up and workup should be based on liver findings. 7. Small hiatal hernia. There is questionable mild thickening of the walls of the gastric antrum. 8. Emphysema. 9. Remaining incidental findings as described above. Findings were discussed with Dr. Renaye Rakers at 11:45 p.m. Electronically Signed   By: Thornell Sartorius M.D.   On: 07/19/2022 00:06   CT Head Wo Contrast  Result Date: 07/18/2022 CLINICAL DATA:  Headache, new onset (Age >= 51y) EXAM: CT HEAD WITHOUT CONTRAST TECHNIQUE: Contiguous axial images were obtained from the base of the skull through the vertex without intravenous contrast. RADIATION DOSE REDUCTION: This exam was performed according to the departmental dose-optimization program which includes automated exposure control, adjustment of the mA and/or kV according to patient size and/or use of iterative reconstruction technique. COMPARISON:  None Available. FINDINGS: Brain: Normal anatomic configuration. No abnormal intra or extra-axial mass lesion or fluid collection. No abnormal mass effect or midline shift. No evidence of acute intracranial hemorrhage or infarct. Ventricular size is normal. Cerebellum unremarkable. Vascular: Unremarkable Skull: Intact Sinuses/Orbits: Paranasal sinuses are clear. Orbits  are unremarkable. Other: Mastoid air cells and middle ear cavities are clear. IMPRESSION: 1. No acute intracranial abnormality. Electronically Signed   By: Helyn Numbers M.D.   On: 07/18/2022 23:35   CT ABDOMEN PELVIS W CONTRAST  Result Date: 07/18/2022 CLINICAL DATA:  Lower abdominal tenderness. EXAM: CT ABDOMEN AND PELVIS WITH CONTRAST TECHNIQUE: Multidetector CT imaging of the abdomen and pelvis was performed using the standard protocol following bolus administration of intravenous contrast. RADIATION DOSE REDUCTION: This exam was performed according to the departmental dose-optimization program which includes automated exposure control, adjustment of the mA and/or kV according to patient size and/or use of iterative reconstruction technique. CONTRAST:  75mL OMNIPAQUE IOHEXOL 350 MG/ML SOLN COMPARISON:  Abdominal ultrasound 03/26/2016. No prior CT available. FINDINGS: Lower chest: Hypoventilatory changes. No basilar airspace disease. Hepatobiliary: The liver is diffusely heterogeneous with multiple low-density liver lesions. Largest lesion is in the posterior right hepatic lobe measuring 4.4 cm, series 3, image 25. There is thrombus within the portal vein at the porta hepatis extending into the liver. Cavernous transformation of the portal vein is seen. Gallbladder physiologically distended, no calcified stone. No biliary dilatation. Pancreas: There is mild heterogeneity in the pancreatic head without definite lesion. No ductal dilatation or inflammation. Spleen: No splenomegaly. There is a cleft in the spleen. Lobulated splenic  contours. Adrenals/Urinary Tract: 14 mm left adrenal nodule. Normal right adrenal gland. No hydronephrosis. Tiny hypodensities in both kidneys are too small to characterize but likely small cysts. No evidence of solid renal lesion. Unremarkable appearance of bladder. Stomach/Bowel: Small hiatal hernia. Equivocal distal gastric wall thickening. No small bowel inflammation. The appendix  is normal. Scattered areas of colonic wall thickening throughout multiple colonic segments. No evidence colonic mass. Vascular/Lymphatic: Aortic atherosclerosis without aneurysm. Nonocclusive thrombus within the main portal vein extending into the intrahepatic portal veins. No propagation into the superior mesenteric or splenic veins. Scattered retroperitoneal and periportal lymph nodes. Reproductive: Uterus and bilateral adnexa are unremarkable. Other: Small volume abdominopelvic ascites. No free air. Musculoskeletal: Multilevel degenerative disc disease and facet hypertrophy. No suspicious bone lesion or acute osseous findings. IMPRESSION: 1. Heterogeneous liver with multiple low-density liver lesions, largest measuring 4.4 cm. Findings are suspicious for metastatic disease. Possibility of primary hepatic malignancy is raised in the setting of chronic hepatitis C. Recommend GI consultation and consideration for hepatic protocol MRI. 2. Nonocclusive thrombus within the main portal vein extending into the intrahepatic portal veins. Cavernous transformation of the portal vein. 3. Scattered areas of colonic wall thickening throughout multiple colonic segments, nondistesion versus colitis. 4. Small volume abdominopelvic ascites. 5. Indeterminate 14 mm left adrenal nodule. Adrenal follow-up and workup should be based on liver findings. 6. Small hiatal hernia. Equivocal distal gastric wall thickening. Aortic Atherosclerosis (ICD10-I70.0). These results were called by telephone at the time of interpretation on 07/18/2022 at 10:17 pm to provider KEN LE , who verbally acknowledged these results. Electronically Signed   By: Narda Rutherford M.D.   On: 07/18/2022 22:17    EKG: Independently reviewed. Sinus tachycardia, rate 110, PVC.   Assessment/Plan  1. Colitis  - Presents with abdominal pain and N/V/D and has CT findings suggestive of colitis  - Rocephin and Flagyl started in ED  - Continue current antibiotics, send  stool for GI pathogen panel, continue IVF hydration, monitor electrolytes    2. Liver lesions  - Concerning for metastatic disease, possible liver primary  - She reports being treated for Hep C ~10 yrs ago  - Check MRI    3. Portal vein thrombosis  - Noted on CT in ED  - Continue IV heparin for now    4. PAD  - No acute ischemia  - Continue Lipitor, ASA   5. COPD  - Not in exacerbation on admission  - Continue Spiriva and as-needed albuterol    DVT prophylaxis: IV heparin  Code Status: Full  Level of Care: Level of care: Progressive Family Communication: None present  Disposition Plan:  Patient is from: home  Anticipated d/c is to: TBD Anticipated d/c date is: 07/21/22  Patient currently: Pending MRI, transition to oral medications  Consults called: None  Admission status: Inpatient     Briscoe Deutscher, MD [facility]  07/19/2022, 1:15 AM

## 2022-07-19 NOTE — ED Notes (Signed)
Patient transported to MRI 

## 2022-07-19 NOTE — ED Notes (Signed)
Pt assisted with bedpan for urinary needs. 

## 2022-07-19 NOTE — ED Notes (Addendum)
MRI reported to this RN that patient did not want to lay flat and wanted to lay on her side. MRI reports patient stated she did not want the test right now and was too restless for it. Patient vomited in MRI and was given PRN compazine upon arrival back to unit.

## 2022-07-19 NOTE — Progress Notes (Addendum)
PROGRESS NOTE    Christine Hurley  ZOX:[id number] DOB: 07-02-1950 DOA: 07/18/2022 PCP: Willow Ora, MD   Brief Narrative:  Christine Hurley is a 72 y.o. female with medical history significant for COPD, hypertension, PAD, and hepatitis C which she states was treated effectively roughly 10 years ago, presenting to the emergency department with abdominal pain, nausea, vomiting, and diarrhea.   ED Course: Upon arrival to the ED, patient is found to be afebrile and saturating well on room air with elevated heart rate and elevated blood pressure.  EKG demonstrates sinus tachycardia 310 and PVC.  Head CT is negative for acute intracranial abnormality.  CTA of the of the chest, abdomen, and pelvis is concerning for multiple liver lesions suspicious for metastatic disease, portal vein thrombosis, colonic wall thickening with fat stranding, and high-grade stenosis of the bilateral superficial femoral arteries.  Labs were most notable for alkaline phosphatase 274, AST 62, ALT 91, normal renal function, normal WBC, and normal hemoglobin.   Patient was started on IV heparin in the ED and treated with 1 L NS, 2 doses IV hydralazine, morphine, Versed, Zofran, Rocephin, and Flagyl.    She became acutely agitated in ED, was given Versed, and is now calm and cooperative.   Assessment & Plan:   Colitis  - Presents with abdominal pain and N/V/D and has CT findings suggestive of colitis possibility of underlying neoplasm cannot be ruled out.  Colonoscopy is recommended for further evaluation. - Rocephin and Flagyl started in ED  - Continue IV fluids, current antibiotics, - stool for GI pathogen panel is pending -Continue to monitor electrolytes closely -She needs GI follow-up outpatient   Liver lesions-concerns for hepatocellular carcinoma Elevated liver enzymes History of hepatitis C: - Concerning for metastatic disease, possible liver primary  - She reports being treated for Hep C ~10 yrs ago   -Reviewed MRI concerning for hepatocellular carcinoma -Check CEA, CA 19-9, CA125, alpha-fetoprotein, INR and hepatitis panel -I discussed MRI findings with the patient and concerning for hepatocellular carcinoma.  She wishes to proceed with liver biopsy. -IR consulted.   Extensive portal vein thrombosis  -In the setting of hepatocellular carcinoma? -Reviewed CT a chest, abdomen and pelvis showed portal vein thrombus extended into hepatic veins in the liver with cavernous transformation.  Questionable thrombus involving the distal branch of superior mesenteric vein. -Continue IV heparin   PAD  High-grade stenosis of proximal superficial femoral arteries bilaterally: -Patient denies claudication symptoms -Discussed with on-call vascular surgery Dr. Sherral Hammers who thinks that CT findings are likely chronic and recommended follow-up outpatient.  Patient likely needs ABI outpatient-that will be arranged by vascular - Continue Lipitor, ASA     COPD  - Not in exacerbation on admission  - Continue Spiriva and as-needed albuterol   Hypokalemia: Replenished.  Magnesium: WNL. -Repeat BMP tomorrow a.m.  Hypertension: Blood pressure elevated upon arrival.  Continue lisinopril, labetalol as needed  Left adrenal nodule: Incidental finding on MRI  Alcohol abuse/marijuana abuse/tobacco abuse: Counseled about cessation  DVT prophylaxis: Heparin Code Status: Full code Family Communication:  None present at bedside.  Plan of care discussed with patient in length and she verbalized understanding and agreed with it. Disposition Plan: To be determined, pending workup  Consultants:  Vascular IR  Procedures:  None  Antimicrobials:  Flagyl Rocephin  Status is: Inpatient    Subjective: Patient seen and examined.  Lying comfortably on the bed.  Reports improvement in abdominal pain however she is scared to eat anything due to  concern for vomiting.  Remained afebrile.  Smokes 4 cigarettes/day  for many years.  Drinks beer sixpacks per week, uses marijuana every day.  No family history of liver cancer  Objective: Vitals:   07/19/22 0700 07/19/22 0730 07/19/22 1010 07/19/22 1031  BP: (!) 194/100 (!) 178/96  (!) 170/93  Pulse: (!) 118 96  85  Resp: (!) 24 15  16   Temp:   98.5 F (36.9 C)   TempSrc:   Oral   SpO2: 100% 100%  100%  Weight:      Height:        Intake/Output Summary (Last 24 hours) at 07/19/2022 1043 Last data filed at 07/19/2022 0126 Gross per 24 hour  Intake 601.11 ml  Output --  Net 601.11 ml   Filed Weights   07/18/22 2004  Weight: 70.3 kg    Examination:  General exam: Appears calm and comfortable, on room air, communicating well Respiratory system: Clear to auscultation. Respiratory effort normal. Cardiovascular system: S1 & S2 heard, RRR. No JVD, murmurs, rubs, gallops or clicks. No pedal edema. Gastrointestinal system: Abdomen is nondistended, soft and mild tenderness positive, no guarding, no rigidity. No organomegaly or masses felt. Normal bowel sounds heard. Central nervous system: Alert and oriented. No focal neurological deficits. Extremities: Symmetric 5 x 5 power. Skin: No rashes, lesions or ulcers Psychiatry: Judgement and insight appear normal. Mood & affect appropriate.    Data Reviewed: I have personally reviewed following labs and imaging studies  CBC: Recent Labs  Lab 07/18/22 2042  WBC 6.2  NEUTROABS 5.5  HGB 13.3  HCT 40.1  MCV 87.7  PLT 200   Basic Metabolic Panel: Recent Labs  Lab 07/18/22 2042 07/19/22 0225  NA 136 133*  K 3.6 2.9*  CL 100 99  CO2 22 17*  GLUCOSE 152* 157*  BUN 13 9  CREATININE 0.78 0.74  CALCIUM 9.2 9.1  MG  --  1.8   GFR: Estimated Creatinine Clearance: 59.5 mL/min (by C-G formula based on SCr of 0.74 mg/dL). Liver Function Tests: Recent Labs  Lab 07/18/22 2042 07/19/22 0225  AST 62* 53*  ALT 91* 86*  ALKPHOS 274* 242*  BILITOT 1.1 1.0  PROT 8.2* 8.0  ALBUMIN 3.8 3.7    Recent Labs  Lab 07/18/22 2042  LIPASE 37   No results for input(s): "AMMONIA" in the last 168 hours. Coagulation Profile: No results for input(s): "INR", "PROTIME" in the last 168 hours. Cardiac Enzymes: No results for input(s): "CKTOTAL", "CKMB", "CKMBINDEX", "TROPONINI" in the last 168 hours. BNP (last 3 results) No results for input(s): "PROBNP" in the last 8760 hours. HbA1C: No results for input(s): "HGBA1C" in the last 72 hours. CBG: No results for input(s): "GLUCAP" in the last 168 hours. Lipid Profile: No results for input(s): "CHOL", "HDL", "LDLCALC", "TRIG", "CHOLHDL", "LDLDIRECT" in the last 72 hours. Thyroid Function Tests: No results for input(s): "TSH", "T4TOTAL", "FREET4", "T3FREE", "THYROIDAB" in the last 72 hours. Anemia Panel: No results for input(s): "VITAMINB12", "FOLATE", "FERRITIN", "TIBC", "IRON", "RETICCTPCT" in the last 72 hours. Sepsis Labs: No results for input(s): "PROCALCITON", "LATICACIDVEN" in the last 168 hours.  No results found for this or any previous visit (from the past 240 hour(s)).    Radiology Studies: CT Angio Chest/Abd/Pel for Dissection W and/or Wo Contrast  Result Date: 07/19/2022 CLINICAL DATA:  Acute aortic syndrome suspected. EXAM: CT ANGIOGRAPHY CHEST, ABDOMEN AND PELVIS TECHNIQUE: Non-contrast CT of the chest was initially obtained. Multidetector CT imaging through the chest, abdomen and pelvis  was performed using the standard protocol during bolus administration of intravenous contrast. Multiplanar reconstructed images and MIPs were obtained and reviewed to evaluate the vascular anatomy. RADIATION DOSE REDUCTION: This exam was performed according to the departmental dose-optimization program which includes automated exposure control, adjustment of the mA and/or kV according to patient size and/or use of iterative reconstruction technique. CONTRAST:  OMNIPAQUE IOHEXOL 350 MG/ML SOLN COMPARISON:  11/20/2021, 08/17/2022. FINDINGS:  CTA CHEST FINDINGS Cardiovascular: The heart is normal in size and there is a trace pericardial effusion. Scattered coronary artery calcifications are noted. There is atherosclerotic calcification of the aorta without evidence of aneurysm or dissection. The pulmonary trunk is normal in caliber. Mediastinum/Nodes: No mediastinal, hilar, or axillary lymphadenopathy. A 1 cm hypodensity is noted in the right lobe of the thyroid gland. No follow-up imaging is recommended. The trachea and esophagus are within normal limits. Lungs/Pleura: Centrilobular emphysematous changes are present in the lungs. Atelectasis is present bilaterally. No effusion or pneumothorax. Musculoskeletal: Degenerative changes are present in the thoracic spine. No acute or suspicious osseous abnormality. Review of the MIP images confirms the above findings. CTA ABDOMEN AND PELVIS FINDINGS VASCULAR Aorta: Normal caliber aorta without aneurysm, dissection, vasculitis or significant stenosis. Aortic atherosclerosis. Celiac: Patent without evidence of aneurysm, dissection, vasculitis or significant stenosis. SMA: Patent without evidence of aneurysm, dissection, vasculitis or significant stenosis. Renals: Both renal arteries are patent without evidence of aneurysm, dissection, vasculitis, fibromuscular dysplasia or significant stenosis. IMA: Patent without evidence of aneurysm, dissection, vasculitis or significant stenosis. Inflow: The common iliac and external iliac and internal iliac arteries are patent. The common femoral and deep femoral arteries are patent bilaterally. There are findings suspicious for high-grade stenosis/occlusion of the proximal superficial femoral arteries bilaterally. Veins: Thrombus is noted in the portal vein with extension into the hepatic veins information multiple collateral vessels at the porta hepatis and gallbladder fossa suggesting collateralization. There are findings suspicious of thrombus in the distal superior  mesenteric vein in the right lower quadrant. The splenic vein appears patent. Review of the MIP images confirms the above findings. NON-VASCULAR Hepatobiliary: Multiple hypodense and hypervascular regions are noted in the liver. No biliary ductal dilatation or stones in the gallbladder. Pancreas: Unremarkable. No pancreatic ductal dilatation or surrounding inflammatory changes. Spleen: Normal in size without focal abnormality. Adrenals/Urinary Tract: There is nodular thickening of the left adrenal gland measuring 1.3 cm. The right adrenal gland is within normal limits. The kidneys enhance symmetrically. Subcentimeter hypodensities are present in the kidneys bilaterally, likely cysts. No renal calculus or hydronephrosis. The bladder is unremarkable. Stomach/Bowel: There is a small hiatal hernia. There is questionable mild thickening of the gastric antrum. Appendix appears normal. No obstruction, free air, or pneumatosis. Scattered areas of colonic wall thickening are noted and most pronounced at the splenic flexure and hepatic flexure with surrounding fat stranding. Lymphatic: No abdominal or pelvic lymphadenopathy by size criteria. Reproductive: Status post hysterectomy. No adnexal masses. Other: Small amount of free fluid is noted in the pelvis. Musculoskeletal: Degenerative changes in the lumbar spine. No acute or suspicious osseous abnormality. Review of the MIP images confirms the above findings. IMPRESSION: 1. No evidence of aortic aneurysm or dissection. 2. Findings suggestive of high-grade stenosis/occlusion of the proximal superficial femoral arteries bilaterally. 3. Multiple hypodense and hypervascular masses in the liver, concerning for metastatic disease. MRI with contrast is recommended for further evaluation. 4. Portal venous thrombus extending into the hepatic veins in the liver with cavernous transformation. There is questionable thrombus involving the  distal branch of the superior mesenteric vein in  the right lower quadrant. 5. Multifocal colonic wall thickening with surrounding fat stranding, most pronounced at the hepatic and splenic flexures suggesting colitis. The possibility of underlying neoplasm can not be excluded. Colonoscopy is recommended for further evaluation on follow-up. 6. Indeterminate left adrenal nodule. Adrenal follow-up and workup should be based on liver findings. 7. Small hiatal hernia. There is questionable mild thickening of the walls of the gastric antrum. 8. Emphysema. 9. Remaining incidental findings as described above. Findings were discussed with Dr. Renaye Rakers at 11:45 p.m. Electronically Signed   By: Thornell Sartorius M.D.   On: 07/19/2022 00:06   CT Head Wo Contrast  Result Date: 07/18/2022 CLINICAL DATA:  Headache, new onset (Age >= 51y) EXAM: CT HEAD WITHOUT CONTRAST TECHNIQUE: Contiguous axial images were obtained from the base of the skull through the vertex without intravenous contrast. RADIATION DOSE REDUCTION: This exam was performed according to the departmental dose-optimization program which includes automated exposure control, adjustment of the mA and/or kV according to patient size and/or use of iterative reconstruction technique. COMPARISON:  None Available. FINDINGS: Brain: Normal anatomic configuration. No abnormal intra or extra-axial mass lesion or fluid collection. No abnormal mass effect or midline shift. No evidence of acute intracranial hemorrhage or infarct. Ventricular size is normal. Cerebellum unremarkable. Vascular: Unremarkable Skull: Intact Sinuses/Orbits: Paranasal sinuses are clear. Orbits are unremarkable. Other: Mastoid air cells and middle ear cavities are clear. IMPRESSION: 1. No acute intracranial abnormality. Electronically Signed   By: Helyn Numbers M.D.   On: 07/18/2022 23:35   CT ABDOMEN PELVIS W CONTRAST  Result Date: 07/18/2022 CLINICAL DATA:  Lower abdominal tenderness. EXAM: CT ABDOMEN AND PELVIS WITH CONTRAST TECHNIQUE: Multidetector CT  imaging of the abdomen and pelvis was performed using the standard protocol following bolus administration of intravenous contrast. RADIATION DOSE REDUCTION: This exam was performed according to the departmental dose-optimization program which includes automated exposure control, adjustment of the mA and/or kV according to patient size and/or use of iterative reconstruction technique. CONTRAST:  75mL OMNIPAQUE IOHEXOL 350 MG/ML SOLN COMPARISON:  Abdominal ultrasound 03/26/2016. No prior CT available. FINDINGS: Lower chest: Hypoventilatory changes. No basilar airspace disease. Hepatobiliary: The liver is diffusely heterogeneous with multiple low-density liver lesions. Largest lesion is in the posterior right hepatic lobe measuring 4.4 cm, series 3, image 25. There is thrombus within the portal vein at the porta hepatis extending into the liver. Cavernous transformation of the portal vein is seen. Gallbladder physiologically distended, no calcified stone. No biliary dilatation. Pancreas: There is mild heterogeneity in the pancreatic head without definite lesion. No ductal dilatation or inflammation. Spleen: No splenomegaly. There is a cleft in the spleen. Lobulated splenic contours. Adrenals/Urinary Tract: 14 mm left adrenal nodule. Normal right adrenal gland. No hydronephrosis. Tiny hypodensities in both kidneys are too small to characterize but likely small cysts. No evidence of solid renal lesion. Unremarkable appearance of bladder. Stomach/Bowel: Small hiatal hernia. Equivocal distal gastric wall thickening. No small bowel inflammation. The appendix is normal. Scattered areas of colonic wall thickening throughout multiple colonic segments. No evidence colonic mass. Vascular/Lymphatic: Aortic atherosclerosis without aneurysm. Nonocclusive thrombus within the main portal vein extending into the intrahepatic portal veins. No propagation into the superior mesenteric or splenic veins. Scattered retroperitoneal and  periportal lymph nodes. Reproductive: Uterus and bilateral adnexa are unremarkable. Other: Small volume abdominopelvic ascites. No free air. Musculoskeletal: Multilevel degenerative disc disease and facet hypertrophy. No suspicious bone lesion or acute osseous findings. IMPRESSION: 1.  Heterogeneous liver with multiple low-density liver lesions, largest measuring 4.4 cm. Findings are suspicious for metastatic disease. Possibility of primary hepatic malignancy is raised in the setting of chronic hepatitis C. Recommend GI consultation and consideration for hepatic protocol MRI. 2. Nonocclusive thrombus within the main portal vein extending into the intrahepatic portal veins. Cavernous transformation of the portal vein. 3. Scattered areas of colonic wall thickening throughout multiple colonic segments, nondistesion versus colitis. 4. Small volume abdominopelvic ascites. 5. Indeterminate 14 mm left adrenal nodule. Adrenal follow-up and workup should be based on liver findings. 6. Small hiatal hernia. Equivocal distal gastric wall thickening. Aortic Atherosclerosis (ICD10-I70.0). These results were called by telephone at the time of interpretation on 07/18/2022 at 10:17 pm to provider KEN LE , who verbally acknowledged these results. Electronically Signed   By: Narda Rutherford M.D.   On: 07/18/2022 22:17    Scheduled Meds:  aspirin EC  81 mg Oral Daily   atorvastatin  10 mg Oral Daily   lisinopril  20 mg Oral Daily   sodium chloride flush  3 mL Intravenous Q12H   umeclidinium bromide  1 puff Inhalation Daily   Continuous Infusions:  cefTRIAXone (ROCEPHIN)  IV 2 g (07/19/22 0915)   heparin 1,150 Units/hr (07/19/22 0850)   metronidazole 500 mg (07/19/22 1029)     LOS: 0 days   Time spent: 50 minutes   Galilea Quito Estill Cotta, MD [facility]  If 7PM-7AM, please contact night-coverage [email or web address] 07/19/2022, 10:43 AM

## 2022-07-19 NOTE — Progress Notes (Addendum)
ANTICOAGULATION CONSULT NOTE  Pharmacy Consult for heparin Indication:  portal vein thrombosis  No Known Allergies  Patient Measurements: Height: 5\' 6"  (167.6 cm) Weight: 70.3 kg (155 lb) IBW/kg (Calculated) : 59.3  Vital Signs: Temp: 98.4 F (36.9 C) (06/08 1635) Temp Source: Oral (06/08 1635) BP: 184/91 (06/08 1635) Pulse Rate: 84 (06/08 1635)  Labs: Recent Labs    07/18/22 2042 07/19/22 0029 07/19/22 0225 07/19/22 0813 07/19/22 1109 07/19/22 1607  HGB 13.3  --   --   --   --   --   HCT 40.1  --   --   --   --   --   PLT 200  --   --   --   --   --   LABPROT  --   --   --   --  13.9  --   INR  --   --   --   --  1.1  --   HEPARINUNFRC  --   --   --  0.69  --  0.56  CREATININE 0.78  --  0.74  --   --   --   TROPONINIHS 5 4  --   --   --   --      Estimated Creatinine Clearance: 59.5 mL/min (by C-G formula based on SCr of 0.74 mg/dL).   Medical History: Past Medical History:  Diagnosis Date   Abnormal nuclear stress test February '13   Small, partially reversible inferolateral ischemia. Marked hypertensive response to exercise. Low risk.   Arthritis    Chest pain with minimal risk for cardiac etiology February 2013   Evaluated with echocardiogram and Myoview, do   COPD (chronic obstructive pulmonary disease) (HCC)    Depression    Hepatitis C    Hypertension    Osteopenia of spine 05/22/2016   PAD (peripheral artery disease) (HCC) 10/01/2021   ABI: monophasic and decreased ABIs. Refer to vascular   Right tibial fracture      Assessment: 72yo female c/o abdominal pain, vomiting, and dizziness x2d, CT reveals portal vein thrombosis >> to begin heparin. No anticoagulants reported PTA or in fill hx.   Heparin level came back therapeutic at 0.56, on 1150 units/hr. Hgb 13.3, plt 200. No s/sx of bleeding or infusion issues.   Goal of Therapy:  Heparin level 0.3-0.7 units/ml Monitor platelets by anticoagulation protocol: Yes   Plan:  Continue heparin  infusion at 1150 units/hr Monitor heparin levels and CBC daily F/u s/sx bleeding and transition to enteral therapies as appropriate  Thank you for allowing pharmacy to participate in this patient's care,  Sherron Monday, PharmD, BCCCP Clinical Pharmacist  Phone: [phone] 07/19/2022 5:22 PM  Please check AMION for all [facility] Pharmacy phone numbers After 10:00 PM, call Main Pharmacy [phone]

## 2022-07-19 NOTE — ED Notes (Signed)
Called and spoke to ED pharmacy madaline and let her know that I paused the heparin drip while patient was at MRI. Told her it was going to be about 35-45 mins off the drip was advised that it was ok. And for me to call her back when I restarted the drip. Verbalized understanding.

## 2022-07-19 NOTE — Progress Notes (Signed)
IR aware of liver biopsy request. Imaging and chart reviewed with and by Dr. Lowella Dandy. Could be challenging or high risk biopsy due to abnormal vasculature as a result of the portal thrombus. AFP noted pending, if AFP is significantly elevated, this would support multifocal HCC and biopsy may not be necessary. However, in the meantime, recommend holding ASA as this needs to be stopped 5 days prior to liver biopsy. Hep gtt can continue until decision is made for timing of biopsy. IR will cont to follow.  Brayton El PA-C Interventional Radiology 07/19/2022 2:36 PM

## 2022-07-19 NOTE — Progress Notes (Signed)
ANTICOAGULATION CONSULT NOTE - Initial Consult  Pharmacy Consult for heparin Indication:  portal vein thrombosis  No Known Allergies  Patient Measurements: Height: 5\' 6"  (167.6 cm) Weight: 70.3 kg (155 lb) IBW/kg (Calculated) : 59.3  Vital Signs: Temp: 98.2 F (36.8 C) (06/08 0600) Temp Source: Oral (06/08 0600) BP: 178/96 (06/08 0730) Pulse Rate: 96 (06/08 0730)  Labs: Recent Labs    07/18/22 2042 07/19/22 0029 07/19/22 0225 07/19/22 0813  HGB 13.3  --   --   --   HCT 40.1  --   --   --   PLT 200  --   --   --   HEPARINUNFRC  --   --   --  0.69  CREATININE 0.78  --  0.74  --   TROPONINIHS 5 4  --   --      Estimated Creatinine Clearance: 59.5 mL/min (by C-G formula based on SCr of 0.74 mg/dL).   Medical History: Past Medical History:  Diagnosis Date   Abnormal nuclear stress test February '13   Small, partially reversible inferolateral ischemia. Marked hypertensive response to exercise. Low risk.   Arthritis    Chest pain with minimal risk for cardiac etiology February 2013   Evaluated with echocardiogram and Myoview, do   COPD (chronic obstructive pulmonary disease) (HCC)    Depression    Hepatitis C    Hypertension    Osteopenia of spine 05/22/2016   PAD (peripheral artery disease) (HCC) 10/01/2021   ABI: monophasic and decreased ABIs. Refer to vascular   Right tibial fracture      Assessment: 72yo female c/o abdominal pain, vomiting, and dizziness x2d, CT reveals portal vein thrombosis >> to begin heparin. No anticoagulants reported PTA or in fill hx.   HL =0.69 - therapeutic but at upper limit No CBC this AM  (CBC 6/7 PM WNL)  Goal of Therapy:  Heparin level 0.3-0.7 units/ml Monitor platelets by anticoagulation protocol: Yes   Plan:  Decrease heparin slightly to 1150 units/hr. Repeat HL in 8hr at 1600  Monitor heparin levels and CBC. F/u s/sx bleeding and transition to enteral therapies as appropriate  Calton Dach, PharmD Clinical  Pharmacist 07/19/2022 8:43 AM

## 2022-07-19 NOTE — ED Notes (Signed)
ED TO INPATIENT HANDOFF REPORT  ED Nurse Name and Phone #: Adalei Novell S [phone]  S Name/Age/Gender Christine Hurley 72 y.o. female Room/Bed: 001C/001C  Code Status   Code Status: Full Code  Home/SNF/Other Home Patient oriented to: self, place, time, and situation Is this baseline? Yes   Triage Complete: Triage complete  Chief Complaint Colitis [K52.9]  Triage Note Patient BIB EMS from home due to vomiting and dizziness. Patient states this has been going on for 2 days and has not been able to keep food and medicine down. States BP has been elevated due to such. Patient  3 dark stools, reports no blood. Patient received 4mg  zofran IV by EMS and did not help nausea. Patient A&Ox4.   Allergies No Known Allergies  Level of Care/Admitting Diagnosis ED Disposition     ED Disposition  Admit   Condition  --   Comment  Hospital Area: [facility] [[id number]]  Level of Care: Progressive [102]  Admit to Progressive based on following criteria: MULTISYSTEM THREATS such as stable sepsis, metabolic/electrolyte imbalance with or without encephalopathy that is responding to early treatment.  May admit patient to [facility] or [name] if equivalent level of care is available:: Yes  Covid Evaluation: Asymptomatic - no recent exposure (last 10 days) testing not required  Diagnosis: Colitis [[id number]]  Admitting Physician: Briscoe Deutscher [[id number]]  Attending Physician: Briscoe Deutscher [[id number]]  Certification:: I certify this patient will need inpatient services for at least 2 midnights  Estimated Length of Stay: 3          B Medical/Surgery History Past Medical History:  Diagnosis Date   Abnormal nuclear stress test February '13   Small, partially reversible inferolateral ischemia. Marked hypertensive response to exercise. Low risk.   Arthritis    Chest pain with minimal risk for cardiac etiology February 2013   Evaluated with echocardiogram and Myoview, do    COPD (chronic obstructive pulmonary disease) (HCC)    Depression    Hepatitis C    Hypertension    Osteopenia of spine 05/22/2016   PAD (peripheral artery disease) (HCC) 10/01/2021   ABI: monophasic and decreased ABIs. Refer to vascular   Right tibial fracture     Past Surgical History:  Procedure Laterality Date   DOPPLER ECHOCARDIOGRAPHY  03/26/2011   LVEF>55% normal LV wall thickness, normal LA, mild aortic sclerosis with trace to mild AI, mild to moderate TR, RVSP of , RA pressure about 5 mmHg   NM MYOVIEW LTD  03/27/11   post stress ejection fraction is 72%, abnormal myocardial perfusion study, this is a low risk scan   TIBIA FRACTURE SURGERY Right 09/2008   fracture of right lower leg with open reduction and internal fixation     A IV Location/Drains/Wounds Patient Lines/Drains/Airways Status     Active Line/Drains/Airways     Name Placement date Placement time Site Days   Peripheral IV 07/18/22 20 G Left Antecubital 07/18/22  --  Antecubital  1   Peripheral IV 07/19/22 20 G Right Antecubital 07/19/22  0013  Antecubital  less than 1            Intake/Output Last 24 hours  Intake/Output Summary (Last 24 hours) at 07/19/2022 1011 Last data filed at 07/19/2022 0126 Gross per 24 hour  Intake 601.11 ml  Output --  Net 601.11 ml    Labs/Imaging Results for orders placed or performed during the hospital encounter of 07/18/22 (from the past 48 hour(s))  Comprehensive  metabolic panel     Status: Abnormal   Collection Time: 07/18/22  8:42 PM  Result Value Ref Range   Sodium 136 135 - 145 mmol/L   Potassium 3.6 3.5 - 5.1 mmol/L   Chloride 100 98 - 111 mmol/L   CO2 22 22 - 32 mmol/L   Glucose, Bld 152 (H) 70 - 99 mg/dL    Comment: Glucose reference range applies only to samples taken after fasting for at least 8 hours.   BUN 13 8 - 23 mg/dL   Creatinine, Ser 1.61 0.44 - 1.00 mg/dL   Calcium 9.2 8.9 - 09.6 mg/dL   Total Protein 8.2 (H) 6.5 - 8.1 g/dL   Albumin 3.8  3.5 - 5.0 g/dL   AST 62 (H) 15 - 41 U/L   ALT 91 (H) 0 - 44 U/L   Alkaline Phosphatase 274 (H) 38 - 126 U/L   Total Bilirubin 1.1 0.3 - 1.2 mg/dL   GFR, Estimated >04 >54 mL/min    Comment: (NOTE) Calculated using the CKD-EPI Creatinine Equation (2021)    Anion gap 14 5 - 15    Comment: Performed at [facility], [address] [place], [place] [address]  Lipase, blood     Status: None   Collection Time: 07/18/22  8:42 PM  Result Value Ref Range   Lipase 37 11 - 51 U/L    Comment: Performed at [facility], [address] [place], [place] [address]  CBC with Diff     Status: Abnormal   Collection Time: 07/18/22  8:42 PM  Result Value Ref Range   WBC 6.2 4.0 - 10.5 K/uL   RBC 4.57 3.87 - 5.11 MIL/uL   Hemoglobin 13.3 12.0 - 15.0 g/dL   HCT 29.5 62.1 - 30.8 %   MCV 87.7 80.0 - 100.0 fL   MCH 29.1 26.0 - 34.0 pg   MCHC 33.2 30.0 - 36.0 g/dL   RDW 65.7 84.6 - 96.2 %   Platelets 200 150 - 400 K/uL   nRBC 0.0 0.0 - 0.2 %   Neutrophils Relative % 90 %   Neutro Abs 5.5 1.7 - 7.7 K/uL   Lymphocytes Relative 8 %   Lymphs Abs 0.5 (L) 0.7 - 4.0 K/uL   Monocytes Relative 2 %   Monocytes Absolute 0.1 0.1 - 1.0 K/uL   Eosinophils Relative 0 %   Eosinophils Absolute 0.0 0.0 - 0.5 K/uL   Basophils Relative 0 %   Basophils Absolute 0.0 0.0 - 0.1 K/uL   Immature Granulocytes 0 %   Abs Immature Granulocytes 0.01 0.00 - 0.07 K/uL    Comment: Performed at [facility], [address] [place], [place], [place] [address]  Urinalysis, Routine w reflex microscopic -Urine, Clean Catch     Status: Abnormal   Collection Time: 07/18/22  8:42 PM  Result Value Ref Range   Color, Urine STRAW (A) YELLOW   APPearance CLEAR CLEAR   Specific Gravity, Urine 1.012 1.005 - 1.030   pH 7.0 5.0 - 8.0   Glucose, UA NEGATIVE NEGATIVE mg/dL   Hgb urine dipstick NEGATIVE NEGATIVE   Bilirubin Urine NEGATIVE NEGATIVE   Ketones, ur 20 (A) NEGATIVE mg/dL   Protein, ur 30 (A) NEGATIVE mg/dL   Nitrite  NEGATIVE NEGATIVE   Leukocytes,Ua NEGATIVE NEGATIVE   RBC / HPF 0-5 0 - 5 RBC/hpf   WBC, UA 0-5 0 - 5 WBC/hpf   Bacteria, UA NONE SEEN NONE SEEN  Squamous Epithelial / HPF 0-5 0 - 5 /HPF   Mucus PRESENT     Comment: Performed at [facility], [address] [place], [place] [address]  Troponin I (High Sensitivity)     Status: None   Collection Time: 07/18/22  8:42 PM  Result Value Ref Range   Troponin I (High Sensitivity) 5 <18 ng/L    Comment: (NOTE) Elevated high sensitivity troponin I (hsTnI) values and significant  changes across serial measurements may suggest ACS but many other  chronic and acute conditions are known to elevate hsTnI results.  Refer to the "Links" section for chest pain algorithms and additional  guidance. Performed at [facility], [address] [place], [place] [address]   Troponin I (High Sensitivity)     Status: None   Collection Time: 07/19/22 12:29 AM  Result Value Ref Range   Troponin I (High Sensitivity) 4 <18 ng/L    Comment: (NOTE) Elevated high sensitivity troponin I (hsTnI) values and significant  changes across serial measurements may suggest ACS but many other  chronic and acute conditions are known to elevate hsTnI results.  Refer to the "Links" section for chest pain algorithms and additional  guidance. Performed at [facility], [address] [place], [place] [address]   Comprehensive metabolic panel     Status: Abnormal   Collection Time: 07/19/22  2:25 AM  Result Value Ref Range   Sodium 133 (L) 135 - 145 mmol/L   Potassium 2.9 (L) 3.5 - 5.1 mmol/L   Chloride 99 98 - 111 mmol/L   CO2 17 (L) 22 - 32 mmol/L   Glucose, Bld 157 (H) 70 - 99 mg/dL    Comment: Glucose reference range applies only to samples taken after fasting for at least 8 hours.   BUN 9 8 - 23 mg/dL   Creatinine, Ser 9.14 0.44 - 1.00 mg/dL   Calcium 9.1 8.9 - 78.2 mg/dL   Total Protein 8.0 6.5 - 8.1 g/dL   Albumin 3.7 3.5 - 5.0 g/dL   AST 53  (H) 15 - 41 U/L   ALT 86 (H) 0 - 44 U/L   Alkaline Phosphatase 242 (H) 38 - 126 U/L   Total Bilirubin 1.0 0.3 - 1.2 mg/dL   GFR, Estimated >95 >62 mL/min    Comment: (NOTE) Calculated using the CKD-EPI Creatinine Equation (2021)    Anion gap 17 (H) 5 - 15    Comment: Performed at [facility], [address] [place], [place] [address]  Magnesium     Status: None   Collection Time: 07/19/22  2:25 AM  Result Value Ref Range   Magnesium 1.8 1.7 - 2.4 mg/dL    Comment: Performed at [facility], [address] [place], [place] [address]  Heparin level (unfractionated)     Status: None   Collection Time: 07/19/22  8:13 AM  Result Value Ref Range   Heparin Unfractionated 0.69 0.30 - 0.70 IU/mL    Comment: (NOTE) The clinical reportable range upper limit is being lowered to >1.10 to align with the FDA approved guidance for the current laboratory assay.  If heparin results are below expected values, and patient dosage has  been confirmed, suggest follow up testing of antithrombin III levels. Performed at [facility], [address], [place], [place] [address]    CT Angio Chest/Abd/Pel for Dissection W and/or Wo Contrast  Result Date: 07/19/2022 CLINICAL DATA:  Acute aortic  syndrome suspected. EXAM: CT ANGIOGRAPHY CHEST, ABDOMEN AND PELVIS TECHNIQUE: Non-contrast CT of the chest was initially obtained. Multidetector CT imaging through the chest, abdomen and pelvis was performed using the standard protocol during bolus administration of intravenous contrast. Multiplanar reconstructed images and MIPs were obtained and reviewed to evaluate the vascular anatomy. RADIATION DOSE REDUCTION: This exam was performed according to the departmental dose-optimization program which includes automated exposure control, adjustment of the mA and/or kV according to patient size and/or use of iterative reconstruction technique. CONTRAST:  OMNIPAQUE IOHEXOL 350 MG/ML SOLN COMPARISON:   11/20/2021, 08/17/2022. FINDINGS: CTA CHEST FINDINGS Cardiovascular: The heart is normal in size and there is a trace pericardial effusion. Scattered coronary artery calcifications are noted. There is atherosclerotic calcification of the aorta without evidence of aneurysm or dissection. The pulmonary trunk is normal in caliber. Mediastinum/Nodes: No mediastinal, hilar, or axillary lymphadenopathy. A 1 cm hypodensity is noted in the right lobe of the thyroid gland. No follow-up imaging is recommended. The trachea and esophagus are within normal limits. Lungs/Pleura: Centrilobular emphysematous changes are present in the lungs. Atelectasis is present bilaterally. No effusion or pneumothorax. Musculoskeletal: Degenerative changes are present in the thoracic spine. No acute or suspicious osseous abnormality. Review of the MIP images confirms the above findings. CTA ABDOMEN AND PELVIS FINDINGS VASCULAR Aorta: Normal caliber aorta without aneurysm, dissection, vasculitis or significant stenosis. Aortic atherosclerosis. Celiac: Patent without evidence of aneurysm, dissection, vasculitis or significant stenosis. SMA: Patent without evidence of aneurysm, dissection, vasculitis or significant stenosis. Renals: Both renal arteries are patent without evidence of aneurysm, dissection, vasculitis, fibromuscular dysplasia or significant stenosis. IMA: Patent without evidence of aneurysm, dissection, vasculitis or significant stenosis. Inflow: The common iliac and external iliac and internal iliac arteries are patent. The common femoral and deep femoral arteries are patent bilaterally. There are findings suspicious for high-grade stenosis/occlusion of the proximal superficial femoral arteries bilaterally. Veins: Thrombus is noted in the portal vein with extension into the hepatic veins information multiple collateral vessels at the porta hepatis and gallbladder fossa suggesting collateralization. There are findings suspicious of  thrombus in the distal superior mesenteric vein in the right lower quadrant. The splenic vein appears patent. Review of the MIP images confirms the above findings. NON-VASCULAR Hepatobiliary: Multiple hypodense and hypervascular regions are noted in the liver. No biliary ductal dilatation or stones in the gallbladder. Pancreas: Unremarkable. No pancreatic ductal dilatation or surrounding inflammatory changes. Spleen: Normal in size without focal abnormality. Adrenals/Urinary Tract: There is nodular thickening of the left adrenal gland measuring 1.3 cm. The right adrenal gland is within normal limits. The kidneys enhance symmetrically. Subcentimeter hypodensities are present in the kidneys bilaterally, likely cysts. No renal calculus or hydronephrosis. The bladder is unremarkable. Stomach/Bowel: There is a small hiatal hernia. There is questionable mild thickening of the gastric antrum. Appendix appears normal. No obstruction, free air, or pneumatosis. Scattered areas of colonic wall thickening are noted and most pronounced at the splenic flexure and hepatic flexure with surrounding fat stranding. Lymphatic: No abdominal or pelvic lymphadenopathy by size criteria. Reproductive: Status post hysterectomy. No adnexal masses. Other: Small amount of free fluid is noted in the pelvis. Musculoskeletal: Degenerative changes in the lumbar spine. No acute or suspicious osseous abnormality. Review of the MIP images confirms the above findings. IMPRESSION: 1. No evidence of aortic aneurysm or dissection. 2. Findings suggestive of high-grade stenosis/occlusion of the proximal superficial femoral arteries bilaterally. 3. Multiple hypodense and hypervascular masses in the liver, concerning for metastatic disease. MRI with  contrast is recommended for further evaluation. 4. Portal venous thrombus extending into the hepatic veins in the liver with cavernous transformation. There is questionable thrombus involving the distal branch of  the superior mesenteric vein in the right lower quadrant. 5. Multifocal colonic wall thickening with surrounding fat stranding, most pronounced at the hepatic and splenic flexures suggesting colitis. The possibility of underlying neoplasm can not be excluded. Colonoscopy is recommended for further evaluation on follow-up. 6. Indeterminate left adrenal nodule. Adrenal follow-up and workup should be based on liver findings. 7. Small hiatal hernia. There is questionable mild thickening of the walls of the gastric antrum. 8. Emphysema. 9. Remaining incidental findings as described above. Findings were discussed with Dr. Renaye Rakers at 11:45 p.m. Electronically Signed   By: Thornell Sartorius M.D.   On: 07/19/2022 00:06   CT Head Wo Contrast  Result Date: 07/18/2022 CLINICAL DATA:  Headache, new onset (Age >= 51y) EXAM: CT HEAD WITHOUT CONTRAST TECHNIQUE: Contiguous axial images were obtained from the base of the skull through the vertex without intravenous contrast. RADIATION DOSE REDUCTION: This exam was performed according to the departmental dose-optimization program which includes automated exposure control, adjustment of the mA and/or kV according to patient size and/or use of iterative reconstruction technique. COMPARISON:  None Available. FINDINGS: Brain: Normal anatomic configuration. No abnormal intra or extra-axial mass lesion or fluid collection. No abnormal mass effect or midline shift. No evidence of acute intracranial hemorrhage or infarct. Ventricular size is normal. Cerebellum unremarkable. Vascular: Unremarkable Skull: Intact Sinuses/Orbits: Paranasal sinuses are clear. Orbits are unremarkable. Other: Mastoid air cells and middle ear cavities are clear. IMPRESSION: 1. No acute intracranial abnormality. Electronically Signed   By: Helyn Numbers M.D.   On: 07/18/2022 23:35   CT ABDOMEN PELVIS W CONTRAST  Result Date: 07/18/2022 CLINICAL DATA:  Lower abdominal tenderness. EXAM: CT ABDOMEN AND PELVIS WITH  CONTRAST TECHNIQUE: Multidetector CT imaging of the abdomen and pelvis was performed using the standard protocol following bolus administration of intravenous contrast. RADIATION DOSE REDUCTION: This exam was performed according to the departmental dose-optimization program which includes automated exposure control, adjustment of the mA and/or kV according to patient size and/or use of iterative reconstruction technique. CONTRAST:  75mL OMNIPAQUE IOHEXOL 350 MG/ML SOLN COMPARISON:  Abdominal ultrasound 03/26/2016. No prior CT available. FINDINGS: Lower chest: Hypoventilatory changes. No basilar airspace disease. Hepatobiliary: The liver is diffusely heterogeneous with multiple low-density liver lesions. Largest lesion is in the posterior right hepatic lobe measuring 4.4 cm, series 3, image 25. There is thrombus within the portal vein at the porta hepatis extending into the liver. Cavernous transformation of the portal vein is seen. Gallbladder physiologically distended, no calcified stone. No biliary dilatation. Pancreas: There is mild heterogeneity in the pancreatic head without definite lesion. No ductal dilatation or inflammation. Spleen: No splenomegaly. There is a cleft in the spleen. Lobulated splenic contours. Adrenals/Urinary Tract: 14 mm left adrenal nodule. Normal right adrenal gland. No hydronephrosis. Tiny hypodensities in both kidneys are too small to characterize but likely small cysts. No evidence of solid renal lesion. Unremarkable appearance of bladder. Stomach/Bowel: Small hiatal hernia. Equivocal distal gastric wall thickening. No small bowel inflammation. The appendix is normal. Scattered areas of colonic wall thickening throughout multiple colonic segments. No evidence colonic mass. Vascular/Lymphatic: Aortic atherosclerosis without aneurysm. Nonocclusive thrombus within the main portal vein extending into the intrahepatic portal veins. No propagation into the superior mesenteric or splenic  veins. Scattered retroperitoneal and periportal lymph nodes. Reproductive: Uterus and bilateral adnexa are  unremarkable. Other: Small volume abdominopelvic ascites. No free air. Musculoskeletal: Multilevel degenerative disc disease and facet hypertrophy. No suspicious bone lesion or acute osseous findings. IMPRESSION: 1. Heterogeneous liver with multiple low-density liver lesions, largest measuring 4.4 cm. Findings are suspicious for metastatic disease. Possibility of primary hepatic malignancy is raised in the setting of chronic hepatitis C. Recommend GI consultation and consideration for hepatic protocol MRI. 2. Nonocclusive thrombus within the main portal vein extending into the intrahepatic portal veins. Cavernous transformation of the portal vein. 3. Scattered areas of colonic wall thickening throughout multiple colonic segments, nondistesion versus colitis. 4. Small volume abdominopelvic ascites. 5. Indeterminate 14 mm left adrenal nodule. Adrenal follow-up and workup should be based on liver findings. 6. Small hiatal hernia. Equivocal distal gastric wall thickening. Aortic Atherosclerosis (ICD10-I70.0). These results were called by telephone at the time of interpretation on 07/18/2022 at 10:17 pm to provider KEN LE , who verbally acknowledged these results. Electronically Signed   By: Narda Rutherford M.D.   On: 07/18/2022 22:17    Pending Labs Unresulted Labs (From admission, onward)     Start     Ordered   07/20/22 0500  Heparin level (unfractionated)  Daily,   R     See Hyperspace for full Linked Orders Report.   07/19/22 0002   07/20/22 0500  CBC  Daily,   R     See Hyperspace for full Linked Orders Report.   07/19/22 0002   07/19/22 1600  Heparin level (unfractionated)  Once-Timed,   TIMED        07/19/22 0846   07/19/22 0500  Comprehensive metabolic panel  Daily,   R      07/19/22 0114   07/19/22 0113  Gastrointestinal Panel by PCR , Stool  (Gastrointestinal Panel by PCR, Stool                                                                                                                                                      **Does Not include CLOSTRIDIUM DIFFICILE testing. **If CDIFF testing is needed, place order from the "C Difficile Testing" order set.**)  Once,   R        07/19/22 0114   07/19/22 0113  C Difficile Quick Screen w PCR reflex  (C Difficile quick screen w PCR reflex panel )  Once, for 24 hours,   TIMED       References:    CDiff Information Tool   07/19/22 0114            Vitals/Pain Today's Vitals   07/19/22 0645 07/19/22 0700 07/19/22 0730 07/19/22 1010  BP: (!) 164/103 (!) 194/100 (!) 178/96   Pulse: 90 (!) 118 96   Resp: 15 (!) 24 15   Temp:    98.5 F (36.9 C)  TempSrc:    Oral  SpO2: 97% 100% 100%   Weight:      Height:      PainSc:        Isolation Precautions Enteric precautions (UV disinfection)  Medications Medications  heparin ADULT infusion 100 units/mL (25000 units/251mL) (1,150 Units/hr Intravenous Rate/Dose Change 07/19/22 0850)  HYDROmorphone (DILAUDID) injection 0.5 mg (has no administration in time range)  prochlorperazine (COMPAZINE) injection 5 mg (5 mg Intravenous Given 07/19/22 0211)  LORazepam (ATIVAN) injection 1 mg (has no administration in time range)  aspirin EC tablet 81 mg (81 mg Oral Given 07/19/22 0909)  atorvastatin (LIPITOR) tablet 10 mg (10 mg Oral Given 07/19/22 0909)  albuterol (PROVENTIL) (2.5 MG/3ML) 0.083% nebulizer solution 2.5 mg (has no administration in time range)  lisinopril (ZESTRIL) tablet 20 mg (20 mg Oral Given 07/19/22 0909)  sodium chloride flush (NS) 0.9 % injection 3 mL (3 mLs Intravenous Not Given 07/19/22 0122)  cefTRIAXone (ROCEPHIN) 2 g in sodium chloride 0.9 % 100 mL IVPB (2 g Intravenous New Bag/Given 07/19/22 0915)  metroNIDAZOLE (FLAGYL) IVPB 500 mg (has no administration in time range)  acetaminophen (TYLENOL) tablet 650 mg (has no administration in time range)    Or  acetaminophen (TYLENOL)  suppository 650 mg (has no administration in time range)  oxyCODONE (Oxy IR/ROXICODONE) immediate release tablet 5 mg (has no administration in time range)  labetalol (NORMODYNE) injection 10 mg (has no administration in time range)  umeclidinium bromide (INCRUSE ELLIPTA) 62.5 MCG/ACT 1 puff (has no administration in time range)  gadobutrol (GADAVIST) 1 MMOL/ML injection 7 mL (has no administration in time range)  morphine (PF) 4 MG/ML injection 4 mg (4 mg Intravenous Given 07/18/22 2040)  ondansetron (ZOFRAN) injection 4 mg (4 mg Intravenous Given 07/18/22 2039)  sodium chloride 0.9 % bolus 1,000 mL (0 mLs Intravenous Stopped 07/18/22 2104)  hydrALAZINE (APRESOLINE) injection 20 mg (20 mg Intravenous Given 07/18/22 2105)  iohexol (OMNIPAQUE) 350 MG/ML injection 75 mL (75 mLs Intravenous Contrast Given 07/18/22 2144)  hydrALAZINE (APRESOLINE) injection 20 mg (20 mg Intravenous Given 07/18/22 2225)  midazolam (VERSED) injection 2 mg (2 mg Intravenous Given 07/18/22 2309)  iohexol (OMNIPAQUE) 350 MG/ML injection 100 mL (100 mLs Intravenous Contrast Given 07/18/22 2322)  cefTRIAXone (ROCEPHIN) 1 g in sodium chloride 0.9 % 100 mL IVPB (0 g Intravenous Stopped 07/19/22 0013)  metroNIDAZOLE (FLAGYL) IVPB 500 mg (0 mg Intravenous Stopped 07/19/22 0126)  heparin bolus via infusion 3,000 Units (3,000 Units Intravenous Bolus from Bag 07/19/22 0028)  LORazepam (ATIVAN) tablet 1 mg (1 mg Oral Given 07/19/22 0105)    Mobility walks with device     Focused Assessments    R Recommendations: See Admitting Provider Note  Report given to:   Additional Notes: patient has heparin go at 11.5. She was off her heparin drip for 35 mins for MRI. Pharmacy is aware.

## 2022-07-20 DIAGNOSIS — K529 Noninfective gastroenteritis and colitis, unspecified: Secondary | ICD-10-CM | POA: Diagnosis not present

## 2022-07-20 DIAGNOSIS — K769 Liver disease, unspecified: Secondary | ICD-10-CM | POA: Diagnosis not present

## 2022-07-20 DIAGNOSIS — I739 Peripheral vascular disease, unspecified: Secondary | ICD-10-CM | POA: Diagnosis not present

## 2022-07-20 DIAGNOSIS — J410 Simple chronic bronchitis: Secondary | ICD-10-CM | POA: Diagnosis not present

## 2022-07-20 LAB — CBC
HCT: 35.4 % — ABNORMAL LOW (ref 36.0–46.0)
Hemoglobin: 11.7 g/dL — ABNORMAL LOW (ref 12.0–15.0)
MCH: 29 pg (ref 26.0–34.0)
MCHC: 33.1 g/dL (ref 30.0–36.0)
MCV: 87.6 fL (ref 80.0–100.0)
Platelets: 180 10*3/uL (ref 150–400)
RBC: 4.04 MIL/uL (ref 3.87–5.11)
RDW: 14.7 % (ref 11.5–15.5)
WBC: 6 10*3/uL (ref 4.0–10.5)
nRBC: 0 % (ref 0.0–0.2)

## 2022-07-20 LAB — COMPREHENSIVE METABOLIC PANEL
ALT: 82 U/L — ABNORMAL HIGH (ref 0–44)
AST: 52 U/L — ABNORMAL HIGH (ref 15–41)
Albumin: 3.1 g/dL — ABNORMAL LOW (ref 3.5–5.0)
Alkaline Phosphatase: 214 U/L — ABNORMAL HIGH (ref 38–126)
Anion gap: 9 (ref 5–15)
BUN: 8 mg/dL (ref 8–23)
CO2: 20 mmol/L — ABNORMAL LOW (ref 22–32)
Calcium: 8.8 mg/dL — ABNORMAL LOW (ref 8.9–10.3)
Chloride: 107 mmol/L (ref 98–111)
Creatinine, Ser: 0.7 mg/dL (ref 0.44–1.00)
GFR, Estimated: >60 mL/min (ref >60)
Glucose, Bld: 100 mg/dL — ABNORMAL HIGH (ref 70–99)
Potassium: 3.6 mmol/L (ref 3.5–5.1)
Sodium: 136 mmol/L (ref 135–145)
Total Bilirubin: 0.8 mg/dL (ref 0.3–1.2)
Total Protein: 6.8 g/dL (ref 6.5–8.1)

## 2022-07-20 LAB — HEPARIN LEVEL (UNFRACTIONATED): Heparin Unfractionated: 0.65 IU/mL (ref 0.30–0.70)

## 2022-07-20 MED ORDER — AMLODIPINE BESYLATE 5 MG PO TABS
5.0000 mg | ORAL_TABLET | Freq: Every day | ORAL | Status: DC
  Administered 2022-07-20 – 2022-07-21 (×2): 5 mg via ORAL
  Filled 2022-07-20 (×2): qty 1

## 2022-07-20 NOTE — Progress Notes (Addendum)
PROGRESS NOTE        PATIENT DETAILS Name: Christine Hurley Age: 72 y.o. Sex: female Date of Birth: 16-Jun-1950 Admit Date: 07/18/2022 Admitting Physician Briscoe Deutscher, MD ZOX:[id number], Malachi Bonds, MD  Brief Summary: Patient is a 72 y.o.  female with COPD, PAD, remote history of hepatitis C-s/p treatment, HTN-who presented with abdominal pain, nausea, vomiting and diarrhea.  She was found to have colitis and subsequently admitted to the hospitalist service.  Significant events: 6/7>> admit to [facility].  Significant studies: 6/7>> CT abdomen/pelvis: Scattered areas of colonic wall thickening, nonocclusive portal vein thrombus, multiple liver lesions-suspicious for metastatic disease. 6/7>> CT head: No acute intracranial abnormality. 6/7>> CT angio abdomen/pelvis: High-grade stenosis/occlusion of superficial femoral arteries bilaterally.  Portal vein thrombus extending into the hepatic veins in the liver with cavernous transformation. 6/8>> MRI abdomen: Findings strongly suggestive of multifocal hepatocellular carcinoma.  Significant microbiology data: 6/8>> C. difficile PCR: Negative 6/8>> GI pathogen panel: Negative  Procedures: None  Consults: IR  Subjective: Abdominal pain-diarrhea has resolved.  Tolerating clear liquids.  Objective: Vitals: Blood pressure (!) 184/117, pulse 79, temperature 98 F (36.7 C), temperature source Oral, resp. rate 16, height 5\' 6"  (1.676 m), weight 70.3 kg, SpO2 94 %.   Exam: Gen Exam:Alert awake-not in any distress HEENT:atraumatic, normocephalic Chest: B/L clear to auscultation anteriorly CVS:S1S2 regular Abdomen:soft non tender, non distended Extremities:no edema Neurology: Non focal Skin: no rash  Pertinent Labs/Radiology:    Latest Ref Rng & Units 07/20/2022    4:36 AM 07/18/2022    8:42 PM 05/14/2020    2:52 PM  CBC  WBC 4.0 - 10.5 K/uL 6.0  6.2  6.3   Hemoglobin 12.0 - 15.0 g/dL 45.4  09.8  11.9   Hematocrit 36.0  - 46.0 % 35.4  40.1  37.9   Platelets 150 - 400 K/uL 180  200  287.0     Lab Results  Component Value Date   NA 136 07/20/2022   K 3.6 07/20/2022   CL 107 07/20/2022   CO2 20 (L) 07/20/2022      Assessment/Plan: Colitis Improved-no pain-no diarrhea-tolerating clear liquids Stool studies negative Advance diet as tolerated Continue empiric antimicrobial therapy  Portal vein thrombus Multiple liver masses-suspicious for hepatocellular carcinoma versus metastatic disease History of HCV IV heparin IR following for potential liver biopsy Tumor markers pending HCV viral load pending  HLD Statin  HTN Uncontrolled Continue lisinopril Add amlodipine Continue to hold HCTZ  PAD Aspirin on hold for potential liver biopsy Continue statin  COPD Not in flare Continue bronchodilators   BMI: Estimated body mass index is 25.02 kg/m as calculated from the following:   Height as of this encounter: 5\' 6"  (1.676 m).   Weight as of this encounter: 70.3 kg.   Code status:   Code Status: Full Code   DVT Prophylaxis:IV heparin   Family Communication: None at bedside   Disposition Plan: Status is: Inpatient Remains inpatient appropriate because: Severity of illness   Planned Discharge Destination:Home   Diet: Diet Order             Diet full liquid Room service appropriate? Yes; Fluid consistency: Thin  Diet effective now                     Antimicrobial agents: Anti-infectives (From admission, onward)    Start  Dose/Rate Route Frequency Ordered Stop   07/19/22 1000  cefTRIAXone (ROCEPHIN) 2 g in sodium chloride 0.9 % 100 mL IVPB        2 g 200 mL/hr over 30 Minutes Intravenous Every 24 hours 07/19/22 0114     07/19/22 1000  metroNIDAZOLE (FLAGYL) IVPB 500 mg        500 mg 100 mL/hr over 60 Minutes Intravenous Every 12 hours 07/19/22 0114     07/18/22 2330  cefTRIAXone (ROCEPHIN) 1 g in sodium chloride 0.9 % 100 mL IVPB        1 g 200 mL/hr over 30  Minutes Intravenous  Once 07/18/22 2328 07/19/22 0013   07/18/22 2330  metroNIDAZOLE (FLAGYL) IVPB 500 mg        500 mg 100 mL/hr over 60 Minutes Intravenous  Once 07/18/22 2328 07/19/22 0126        MEDICATIONS: Scheduled Meds:  atorvastatin  10 mg Oral Daily   lisinopril  20 mg Oral Daily   sodium chloride flush  3 mL Intravenous Q12H   umeclidinium bromide  1 puff Inhalation Daily   Continuous Infusions:  cefTRIAXone (ROCEPHIN)  IV Stopped (07/19/22 0945)   heparin 1,150 Units/hr (07/20/22 0657)   metronidazole Stopped (07/20/22 0725)   PRN Meds:.acetaminophen **OR** acetaminophen, albuterol, HYDROmorphone (DILAUDID) injection, labetalol, LORazepam, oxyCODONE, prochlorperazine   I have personally reviewed following labs and imaging studies  LABORATORY DATA: CBC: Recent Labs  Lab 07/18/22 2042 07/20/22 0436  WBC 6.2 6.0  NEUTROABS 5.5  --   HGB 13.3 11.7*  HCT 40.1 35.4*  MCV 87.7 87.6  PLT 200 180    Basic Metabolic Panel: Recent Labs  Lab 07/18/22 2042 07/19/22 0225 07/20/22 0436  NA 136 133* 136  K 3.6 2.9* 3.6  CL 100 99 107  CO2 22 17* 20*  GLUCOSE 152* 157* 100*  BUN 13 9 8   CREATININE 0.78 0.74 0.70  CALCIUM 9.2 9.1 8.8*  MG  --  1.8  --     GFR: Estimated Creatinine Clearance: 59.5 mL/min (by C-G formula based on SCr of 0.7 mg/dL).  Liver Function Tests: Recent Labs  Lab 07/18/22 2042 07/19/22 0225 07/20/22 0436  AST 62* 53* 52*  ALT 91* 86* 82*  ALKPHOS 274* 242* 214*  BILITOT 1.1 1.0 0.8  PROT 8.2* 8.0 6.8  ALBUMIN 3.8 3.7 3.1*   Recent Labs  Lab 07/18/22 2042  LIPASE 37   No results for input(s): "AMMONIA" in the last 168 hours.  Coagulation Profile: Recent Labs  Lab 07/19/22 1109  INR 1.1    Cardiac Enzymes: No results for input(s): "CKTOTAL", "CKMB", "CKMBINDEX", "TROPONINI" in the last 168 hours.  BNP (last 3 results) No results for input(s): "PROBNP" in the last 8760 hours.  Lipid Profile: No results for  input(s): "CHOL", "HDL", "LDLCALC", "TRIG", "CHOLHDL", "LDLDIRECT" in the last 72 hours.  Thyroid Function Tests: No results for input(s): "TSH", "T4TOTAL", "FREET4", "T3FREE", "THYROIDAB" in the last 72 hours.  Anemia Panel: No results for input(s): "VITAMINB12", "FOLATE", "FERRITIN", "TIBC", "IRON", "RETICCTPCT" in the last 72 hours.  Urine analysis:    Component Value Date/Time   COLORURINE STRAW (A) 07/18/2022 2042   APPEARANCEUR CLEAR 07/18/2022 2042   LABSPEC 1.012 07/18/2022 2042   PHURINE 7.0 07/18/2022 2042   GLUCOSEU NEGATIVE 07/18/2022 2042   HGBUR NEGATIVE 07/18/2022 2042   HGBUR negative 11/06/2009 0000   BILIRUBINUR NEGATIVE 07/18/2022 2042   KETONESUR 20 (A) 07/18/2022 2042   PROTEINUR 30 (A) 07/18/2022 2042  UROBILINOGEN 2.0 11/06/2009 0000   NITRITE NEGATIVE 07/18/2022 2042   LEUKOCYTESUR NEGATIVE 07/18/2022 2042    Sepsis Labs: Lactic Acid, Venous    Component Value Date/Time   LATICACIDVEN 2.38 (HH) 04/23/2017 1334    MICROBIOLOGY: Recent Results (from the past 240 hour(s))  Gastrointestinal Panel by PCR , Stool     Status: None   Collection Time: 07/19/22  1:52 PM   Specimen: STOOL  Result Value Ref Range Status   Campylobacter species NOT DETECTED NOT DETECTED Final   Plesimonas shigelloides NOT DETECTED NOT DETECTED Final   Salmonella species NOT DETECTED NOT DETECTED Final   Yersinia enterocolitica NOT DETECTED NOT DETECTED Final   Vibrio species NOT DETECTED NOT DETECTED Final   Vibrio cholerae NOT DETECTED NOT DETECTED Final   Enteroaggregative E coli (EAEC) NOT DETECTED NOT DETECTED Final   Enteropathogenic E coli (EPEC) NOT DETECTED NOT DETECTED Final   Enterotoxigenic E coli (ETEC) NOT DETECTED NOT DETECTED Final   Shiga like toxin producing E coli (STEC) NOT DETECTED NOT DETECTED Final   Shigella/Enteroinvasive E coli (EIEC) NOT DETECTED NOT DETECTED Final   Cryptosporidium NOT DETECTED NOT DETECTED Final   Cyclospora cayetanensis NOT  DETECTED NOT DETECTED Final   Entamoeba histolytica NOT DETECTED NOT DETECTED Final   Giardia lamblia NOT DETECTED NOT DETECTED Final   Adenovirus F40/41 NOT DETECTED NOT DETECTED Final   Astrovirus NOT DETECTED NOT DETECTED Final   Norovirus GI/GII NOT DETECTED NOT DETECTED Final   Rotavirus A NOT DETECTED NOT DETECTED Final   Sapovirus (I, II, IV, and V) NOT DETECTED NOT DETECTED Final    Comment: Performed at [facility], [address] [place], [place] [address]  C Difficile Quick Screen w PCR reflex     Status: None   Collection Time: 07/19/22  1:52 PM   Specimen: STOOL  Result Value Ref Range Status   C Diff antigen NEGATIVE NEGATIVE Final   C Diff toxin NEGATIVE NEGATIVE Final   C Diff interpretation No C. difficile detected.  Final    Comment: Performed at [facility], [address], [place], [place] [address]    RADIOLOGY STUDIES/RESULTS: MR LIVER W WO CONTRAST  Result Date: 07/19/2022 CLINICAL DATA:  Evaluate liver lesions. EXAM: MRI ABDOMEN WITHOUT AND WITH CONTRAST TECHNIQUE: Multiplanar multisequence MR imaging of the abdomen was performed both before and after the administration of intravenous contrast. CONTRAST:  7mL GADAVIST GADOBUTROL 1 MMOL/ML IV SOLN COMPARISON:  CT 07/18/2022 FINDINGS: Lower chest: No acute findings. Hepatobiliary: The postcontrast images on today's study are limited by respiratory motion artifact. Within this limitation, there is extensive tumor identified throughout both lobes of liver. These show apparent arterial phase enhancement, and on the delayed images these become isointense to hypointense. This is better demonstrated on the portal venous phase images from 07/18/2022 and the arterial phase images from the CTA chest, abdomen and pelvis from 07/18/2022. Index lesion within the posterior right hepatic lobe measures 5.3 x 3.6 cm, image 14/5. Within the caudate lobe there is a lesion measuring 4.6 x 2.7 cm, image 13/5. Extensive,  thrombus is identified throughout the portal veins which extends into the intrahepatic portal vein into the extrahepatic portal vein. Assessment for enhancing tumor thrombus is limited due to delayed contrast bolus during the arterial phase images as well as excessive motion artifact. Gallbladder appears normal. No bile duct dilatation. Pancreas: No mass, inflammatory changes, or other parenchymal abnormality identified. Spleen:  Within normal limits in size and appearance.  Adrenals/Urinary Tract: Normal right adrenal gland. Enhancing left adrenal nodule is identified measuring 1.7 x 1.5 cm, image 39/21. On the exam from 11/20/2021 this measured 1.5 x 1.3 cm this is difficult to further characterize on the in and out of phase sequence is due to excessive motion artifact. No hydronephrosis. Small cyst identified off the upper pole of the left kidney. No hydronephrosis identified bilaterally. Stomach/Bowel: Stomach appears normal. No dilated loops of large or small bowel. Vascular/Lymphatic: Normal appearance of the abdominal aorta. No enlarged upper abdominal lymph nodes. Other: Mesenteric and bilateral retroperitoneal edema noted. Trace fluid is seen extending along bilateral pericolic gutters. Musculoskeletal: No suspicious bone lesions identified. IMPRESSION: 1. The postcontrast images on today's study are limited by respiratory motion artifact. Within this limitation, there is extensive tumor identified throughout both lobes of liver. These show apparent arterial phase enhancement, and on the delayed images these become isointense to hypointense. This is better demonstrated on the portal venous phase images from 07/18/2022 and the arterial phase images from the CTA chest, abdomen and pelvis from 07/18/2022. In a patient who is at increased risk these findings are strongly suggestive of multifocal hepatocellular carcinoma. Correlation with alpha fetoprotein levels and possible biopsy. 2. Extensive thrombus is  identified throughout the portal veins which extends into the intrahepatic portal vein into the extrahepatic portal vein. Assessment for enhancing tumor thrombus is limited due to delayed contrast bolus during the arterial phase images as well as excessive motion artifact. However, presence of a enhancing tumor thrombus may be seen with hepatocellular carcinoma. 3. Enhancing left adrenal nodule is identified measuring 1.7 x 1.5 cm. On 11/20/2021 this measured 1.5 x 1.3 cm. This is difficult to further characterize on the in and out of phase sequences due to excessive motion artifact. 4. Mesenteric and bilateral retroperitoneal edema. Trace fluid is seen extending along bilateral pericolic gutters. Electronically Signed   By: Signa Kell M.D.   On: 07/19/2022 11:47   CT Angio Chest/Abd/Pel for Dissection W and/or Wo Contrast  Result Date: 07/19/2022 CLINICAL DATA:  Acute aortic syndrome suspected. EXAM: CT ANGIOGRAPHY CHEST, ABDOMEN AND PELVIS TECHNIQUE: Non-contrast CT of the chest was initially obtained. Multidetector CT imaging through the chest, abdomen and pelvis was performed using the standard protocol during bolus administration of intravenous contrast. Multiplanar reconstructed images and MIPs were obtained and reviewed to evaluate the vascular anatomy. RADIATION DOSE REDUCTION: This exam was performed according to the departmental dose-optimization program which includes automated exposure control, adjustment of the mA and/or kV according to patient size and/or use of iterative reconstruction technique. CONTRAST:  OMNIPAQUE IOHEXOL 350 MG/ML SOLN COMPARISON:  11/20/2021, 08/17/2022. FINDINGS: CTA CHEST FINDINGS Cardiovascular: The heart is normal in size and there is a trace pericardial effusion. Scattered coronary artery calcifications are noted. There is atherosclerotic calcification of the aorta without evidence of aneurysm or dissection. The pulmonary trunk is normal in caliber.  Mediastinum/Nodes: No mediastinal, hilar, or axillary lymphadenopathy. A 1 cm hypodensity is noted in the right lobe of the thyroid gland. No follow-up imaging is recommended. The trachea and esophagus are within normal limits. Lungs/Pleura: Centrilobular emphysematous changes are present in the lungs. Atelectasis is present bilaterally. No effusion or pneumothorax. Musculoskeletal: Degenerative changes are present in the thoracic spine. No acute or suspicious osseous abnormality. Review of the MIP images confirms the above findings. CTA ABDOMEN AND PELVIS FINDINGS VASCULAR Aorta: Normal caliber aorta without aneurysm, dissection, vasculitis or significant stenosis. Aortic atherosclerosis. Celiac: Patent without evidence of aneurysm, dissection, vasculitis  or significant stenosis. SMA: Patent without evidence of aneurysm, dissection, vasculitis or significant stenosis. Renals: Both renal arteries are patent without evidence of aneurysm, dissection, vasculitis, fibromuscular dysplasia or significant stenosis. IMA: Patent without evidence of aneurysm, dissection, vasculitis or significant stenosis. Inflow: The common iliac and external iliac and internal iliac arteries are patent. The common femoral and deep femoral arteries are patent bilaterally. There are findings suspicious for high-grade stenosis/occlusion of the proximal superficial femoral arteries bilaterally. Veins: Thrombus is noted in the portal vein with extension into the hepatic veins information multiple collateral vessels at the porta hepatis and gallbladder fossa suggesting collateralization. There are findings suspicious of thrombus in the distal superior mesenteric vein in the right lower quadrant. The splenic vein appears patent. Review of the MIP images confirms the above findings. NON-VASCULAR Hepatobiliary: Multiple hypodense and hypervascular regions are noted in the liver. No biliary ductal dilatation or stones in the gallbladder. Pancreas:  Unremarkable. No pancreatic ductal dilatation or surrounding inflammatory changes. Spleen: Normal in size without focal abnormality. Adrenals/Urinary Tract: There is nodular thickening of the left adrenal gland measuring 1.3 cm. The right adrenal gland is within normal limits. The kidneys enhance symmetrically. Subcentimeter hypodensities are present in the kidneys bilaterally, likely cysts. No renal calculus or hydronephrosis. The bladder is unremarkable. Stomach/Bowel: There is a small hiatal hernia. There is questionable mild thickening of the gastric antrum. Appendix appears normal. No obstruction, free air, or pneumatosis. Scattered areas of colonic wall thickening are noted and most pronounced at the splenic flexure and hepatic flexure with surrounding fat stranding. Lymphatic: No abdominal or pelvic lymphadenopathy by size criteria. Reproductive: Status post hysterectomy. No adnexal masses. Other: Small amount of free fluid is noted in the pelvis. Musculoskeletal: Degenerative changes in the lumbar spine. No acute or suspicious osseous abnormality. Review of the MIP images confirms the above findings. IMPRESSION: 1. No evidence of aortic aneurysm or dissection. 2. Findings suggestive of high-grade stenosis/occlusion of the proximal superficial femoral arteries bilaterally. 3. Multiple hypodense and hypervascular masses in the liver, concerning for metastatic disease. MRI with contrast is recommended for further evaluation. 4. Portal venous thrombus extending into the hepatic veins in the liver with cavernous transformation. There is questionable thrombus involving the distal branch of the superior mesenteric vein in the right lower quadrant. 5. Multifocal colonic wall thickening with surrounding fat stranding, most pronounced at the hepatic and splenic flexures suggesting colitis. The possibility of underlying neoplasm can not be excluded. Colonoscopy is recommended for further evaluation on follow-up. 6.  Indeterminate left adrenal nodule. Adrenal follow-up and workup should be based on liver findings. 7. Small hiatal hernia. There is questionable mild thickening of the walls of the gastric antrum. 8. Emphysema. 9. Remaining incidental findings as described above. Findings were discussed with Dr. Renaye Rakers at 11:45 p.m. Electronically Signed   By: Thornell Sartorius M.D.   On: 07/19/2022 00:06   CT Head Wo Contrast  Result Date: 07/18/2022 CLINICAL DATA:  Headache, new onset (Age >= 51y) EXAM: CT HEAD WITHOUT CONTRAST TECHNIQUE: Contiguous axial images were obtained from the base of the skull through the vertex without intravenous contrast. RADIATION DOSE REDUCTION: This exam was performed according to the departmental dose-optimization program which includes automated exposure control, adjustment of the mA and/or kV according to patient size and/or use of iterative reconstruction technique. COMPARISON:  None Available. FINDINGS: Brain: Normal anatomic configuration. No abnormal intra or extra-axial mass lesion or fluid collection. No abnormal mass effect or midline shift. No evidence of acute intracranial  hemorrhage or infarct. Ventricular size is normal. Cerebellum unremarkable. Vascular: Unremarkable Skull: Intact Sinuses/Orbits: Paranasal sinuses are clear. Orbits are unremarkable. Other: Mastoid air cells and middle ear cavities are clear. IMPRESSION: 1. No acute intracranial abnormality. Electronically Signed   By: Helyn Numbers M.D.   On: 07/18/2022 23:35   CT ABDOMEN PELVIS W CONTRAST  Result Date: 07/18/2022 CLINICAL DATA:  Lower abdominal tenderness. EXAM: CT ABDOMEN AND PELVIS WITH CONTRAST TECHNIQUE: Multidetector CT imaging of the abdomen and pelvis was performed using the standard protocol following bolus administration of intravenous contrast. RADIATION DOSE REDUCTION: This exam was performed according to the departmental dose-optimization program which includes automated exposure control, adjustment of  the mA and/or kV according to patient size and/or use of iterative reconstruction technique. CONTRAST:  75mL OMNIPAQUE IOHEXOL 350 MG/ML SOLN COMPARISON:  Abdominal ultrasound 03/26/2016. No prior CT available. FINDINGS: Lower chest: Hypoventilatory changes. No basilar airspace disease. Hepatobiliary: The liver is diffusely heterogeneous with multiple low-density liver lesions. Largest lesion is in the posterior right hepatic lobe measuring 4.4 cm, series 3, image 25. There is thrombus within the portal vein at the porta hepatis extending into the liver. Cavernous transformation of the portal vein is seen. Gallbladder physiologically distended, no calcified stone. No biliary dilatation. Pancreas: There is mild heterogeneity in the pancreatic head without definite lesion. No ductal dilatation or inflammation. Spleen: No splenomegaly. There is a cleft in the spleen. Lobulated splenic contours. Adrenals/Urinary Tract: 14 mm left adrenal nodule. Normal right adrenal gland. No hydronephrosis. Tiny hypodensities in both kidneys are too small to characterize but likely small cysts. No evidence of solid renal lesion. Unremarkable appearance of bladder. Stomach/Bowel: Small hiatal hernia. Equivocal distal gastric wall thickening. No small bowel inflammation. The appendix is normal. Scattered areas of colonic wall thickening throughout multiple colonic segments. No evidence colonic mass. Vascular/Lymphatic: Aortic atherosclerosis without aneurysm. Nonocclusive thrombus within the main portal vein extending into the intrahepatic portal veins. No propagation into the superior mesenteric or splenic veins. Scattered retroperitoneal and periportal lymph nodes. Reproductive: Uterus and bilateral adnexa are unremarkable. Other: Small volume abdominopelvic ascites. No free air. Musculoskeletal: Multilevel degenerative disc disease and facet hypertrophy. No suspicious bone lesion or acute osseous findings. IMPRESSION: 1. Heterogeneous  liver with multiple low-density liver lesions, largest measuring 4.4 cm. Findings are suspicious for metastatic disease. Possibility of primary hepatic malignancy is raised in the setting of chronic hepatitis C. Recommend GI consultation and consideration for hepatic protocol MRI. 2. Nonocclusive thrombus within the main portal vein extending into the intrahepatic portal veins. Cavernous transformation of the portal vein. 3. Scattered areas of colonic wall thickening throughout multiple colonic segments, nondistesion versus colitis. 4. Small volume abdominopelvic ascites. 5. Indeterminate 14 mm left adrenal nodule. Adrenal follow-up and workup should be based on liver findings. 6. Small hiatal hernia. Equivocal distal gastric wall thickening. Aortic Atherosclerosis (ICD10-I70.0). These results were called by telephone at the time of interpretation on 07/18/2022 at 10:17 pm to provider KEN LE , who verbally acknowledged these results. Electronically Signed   By: Narda Rutherford M.D.   On: 07/18/2022 22:17     LOS: 1 day   Jeoffrey Massed, MD  [facility]    To contact the attending provider between 7A-7P or the covering provider during after hours 7P-7A, please log into the web site [email or web address] and access using universal [place] password for that web site. If you do not have the password, please call the hospital operator.  07/20/2022, 8:30 AM

## 2022-07-20 NOTE — Progress Notes (Signed)
ANTICOAGULATION CONSULT NOTE  Pharmacy Consult for heparin Indication:  portal vein thrombosis  No Known Allergies  Patient Measurements: Height: 5\' 6"  (167.6 cm) Weight: 70.3 kg (155 lb) IBW/kg (Calculated) : 59.3  Vital Signs: Temp: 98.9 F (37.2 C) (06/09 0822) Temp Source: Oral (06/09 0822) BP: 174/103 (06/09 0822) Pulse Rate: 80 (06/09 0822)  Labs: Recent Labs    07/18/22 2042 07/19/22 0029 07/19/22 0225 07/19/22 0813 07/19/22 1109 07/19/22 1607 07/20/22 0436  HGB 13.3  --   --   --   --   --  11.7*  HCT 40.1  --   --   --   --   --  35.4*  PLT 200  --   --   --   --   --  180  LABPROT  --   --   --   --  13.9  --   --   INR  --   --   --   --  1.1  --   --   HEPARINUNFRC  --   --   --  0.69  --  0.56 0.65  CREATININE 0.78  --  0.74  --   --   --  0.70  TROPONINIHS 5 4  --   --   --   --   --     Estimated Creatinine Clearance: 59.5 mL/min (by C-G formula based on SCr of 0.7 mg/dL).  Medical History: Past Medical History:  Diagnosis Date   Abnormal nuclear stress test February '13   Small, partially reversible inferolateral ischemia. Marked hypertensive response to exercise. Low risk.   Arthritis    Chest pain with minimal risk for cardiac etiology February 2013   Evaluated with echocardiogram and Myoview, do   COPD (chronic obstructive pulmonary disease) (HCC)    Depression    Hepatitis C    Hypertension    Osteopenia of spine 05/22/2016   PAD (peripheral artery disease) (HCC) 10/01/2021   ABI: monophasic and decreased ABIs. Refer to vascular   Right tibial fracture     Assessment: 72yo female c/o abdominal pain, vomiting, and dizziness x2d, CT reveals portal vein thrombosis >> to begin heparin. No anticoagulants reported PTA or in fill hx.   Heparin level came back therapeutic at 0.65, on 1150 units/hr. Hgb 11.7, plt 180. No s/sx of bleeding or infusion issues.   Goal of Therapy:  Heparin level 0.3-0.7 units/ml Monitor platelets by anticoagulation  protocol: Yes   Plan:  Continue heparin infusion at 1150 units/hr Monitor heparin levels and CBC daily F/u s/sx bleeding and transition to enteral therapies as appropriate  Thank you for allowing pharmacy to participate in this patient's care,  Georga Hacking, Pharm.D PGY1 Pharmacy Resident 07/20/2022 11:15 AM  Please check AMION for all [facility] phone numbers After 10:00 PM, call Main Pharmacy [phone]

## 2022-07-21 ENCOUNTER — Other Ambulatory Visit (HOSPITAL_COMMUNITY): Payer: Self-pay

## 2022-07-21 ENCOUNTER — Telehealth: Payer: Self-pay | Admitting: Hematology

## 2022-07-21 DIAGNOSIS — I81 Portal vein thrombosis: Secondary | ICD-10-CM | POA: Diagnosis not present

## 2022-07-21 DIAGNOSIS — K529 Noninfective gastroenteritis and colitis, unspecified: Secondary | ICD-10-CM | POA: Diagnosis not present

## 2022-07-21 DIAGNOSIS — J410 Simple chronic bronchitis: Secondary | ICD-10-CM | POA: Diagnosis not present

## 2022-07-21 DIAGNOSIS — K769 Liver disease, unspecified: Secondary | ICD-10-CM | POA: Diagnosis not present

## 2022-07-21 LAB — CBC
HCT: 35.6 % — ABNORMAL LOW (ref 36.0–46.0)
Hemoglobin: 11.8 g/dL — ABNORMAL LOW (ref 12.0–15.0)
MCH: 30 pg (ref 26.0–34.0)
MCHC: 33.1 g/dL (ref 30.0–36.0)
MCV: 90.6 fL (ref 80.0–100.0)
Platelets: 185 10*3/uL (ref 150–400)
RBC: 3.93 MIL/uL (ref 3.87–5.11)
RDW: 14.7 % (ref 11.5–15.5)
WBC: 5.8 10*3/uL (ref 4.0–10.5)
nRBC: 0 % (ref 0.0–0.2)

## 2022-07-21 LAB — COMPREHENSIVE METABOLIC PANEL
ALT: 86 U/L — ABNORMAL HIGH (ref 0–44)
AST: 53 U/L — ABNORMAL HIGH (ref 15–41)
Albumin: 3.1 g/dL — ABNORMAL LOW (ref 3.5–5.0)
Alkaline Phosphatase: 217 U/L — ABNORMAL HIGH (ref 38–126)
Anion gap: 10 (ref 5–15)
BUN: 11 mg/dL (ref 8–23)
CO2: 21 mmol/L — ABNORMAL LOW (ref 22–32)
Calcium: 8.8 mg/dL — ABNORMAL LOW (ref 8.9–10.3)
Chloride: 103 mmol/L (ref 98–111)
Creatinine, Ser: 0.76 mg/dL (ref 0.44–1.00)
GFR, Estimated: >60 mL/min (ref >60)
Glucose, Bld: 107 mg/dL — ABNORMAL HIGH (ref 70–99)
Potassium: 3.2 mmol/L — ABNORMAL LOW (ref 3.5–5.1)
Sodium: 134 mmol/L — ABNORMAL LOW (ref 135–145)
Total Bilirubin: 0.5 mg/dL (ref 0.3–1.2)
Total Protein: 6.8 g/dL (ref 6.5–8.1)

## 2022-07-21 LAB — HCV RNA QUANT: HCV Quantitative: NOT DETECTED IU/mL (ref >50)

## 2022-07-21 LAB — HEPARIN LEVEL (UNFRACTIONATED): Heparin Unfractionated: 0.66 IU/mL (ref 0.30–0.70)

## 2022-07-21 MED ORDER — AMLODIPINE BESYLATE 5 MG PO TABS
5.0000 mg | ORAL_TABLET | Freq: Every day | ORAL | 0 refills | Status: DC
  Filled 2022-07-21: qty 30, 30d supply, fill #0

## 2022-07-21 MED ORDER — APIXABAN 5 MG PO TABS
10.0000 mg | ORAL_TABLET | Freq: Two times a day (BID) | ORAL | Status: DC
  Administered 2022-07-21: 10 mg via ORAL
  Filled 2022-07-21: qty 2

## 2022-07-21 MED ORDER — AMLODIPINE BESYLATE 5 MG PO TABS
5.0000 mg | ORAL_TABLET | Freq: Once | ORAL | Status: AC
  Administered 2022-07-21: 5 mg via ORAL
  Filled 2022-07-21: qty 1

## 2022-07-21 MED ORDER — APIXABAN (ELIQUIS) VTE STARTER PACK (10MG AND 5MG)
ORAL_TABLET | ORAL | 0 refills | Status: DC
  Filled 2022-07-21: qty 74, 30d supply, fill #0

## 2022-07-21 MED ORDER — POTASSIUM CHLORIDE CRYS ER 20 MEQ PO TBCR
40.0000 meq | EXTENDED_RELEASE_TABLET | Freq: Once | ORAL | Status: AC
  Administered 2022-07-21: 40 meq via ORAL
  Filled 2022-07-21: qty 2

## 2022-07-21 MED ORDER — APIXABAN 5 MG PO TABS: 5.0000 mg | ORAL_TABLET | Freq: Two times a day (BID) | ORAL | Status: DC

## 2022-07-21 MED ORDER — APIXABAN 5 MG PO TABS: ORAL_TABLET | ORAL | 1 refills | Status: DC

## 2022-07-21 MED ORDER — AMLODIPINE BESYLATE 10 MG PO TABS: 10.0000 mg | ORAL_TABLET | Freq: Every day | ORAL | Status: DC

## 2022-07-21 NOTE — Progress Notes (Signed)
I left a vm for Ms Amspacher on her cell phone requesting she return my call to scheduled consultation appt with medical oncology.

## 2022-07-21 NOTE — Discharge Summary (Signed)
PATIENT DETAILS Name: Christine Hurley Age: 72 y.o. Sex: female Date of Birth: 05/02/1950 MRN: [id number]. Admitting Physician: Briscoe Deutscher, MD [id number], Malachi Bonds, MD  Admit Date: 07/18/2022 Discharge date: 07/21/2022  Recommendations for Outpatient Follow-up:  Follow up with PCP in 1-2 weeks Please obtain CMP/CBC in one week Please follow up on AFP and other tumor markers Follow HCV viral load Please ensure follow-up with interventional radiology and medical oncology.   Admitted From:  Home  Disposition: Home   Discharge Condition: good  CODE STATUS:   Code Status: Full Code   Diet recommendation:  Diet Order             Diet - low sodium heart healthy           Diet Carb Modified           DIET SOFT Room service appropriate? Yes; Fluid consistency: Thin  Diet effective now                    Brief Summary: Patient is a 72 y.o.  female with COPD, PAD, remote history of hepatitis C-s/p treatment, HTN-who presented with abdominal pain, nausea, vomiting and diarrhea.  She was found to have colitis and subsequently admitted to the hospitalist service.   Significant events: 6/7>> admit to [facility].   Significant studies: 6/7>> CT abdomen/pelvis: Scattered areas of colonic wall thickening, nonocclusive portal vein thrombus, multiple liver lesions-suspicious for metastatic disease. 6/7>> CT head: No acute intracranial abnormality. 6/7>> CT angio abdomen/pelvis: High-grade stenosis/occlusion of superficial femoral arteries bilaterally.  Portal vein thrombus extending into the hepatic veins in the liver with cavernous transformation. 6/8>> MRI abdomen: Findings strongly suggestive of multifocal hepatocellular carcinoma.   Significant microbiology data: 6/8>> C. difficile PCR: Negative 6/8>> GI pathogen panel: Negative   Procedures: None   Consults: IR  Brief Hospital Course: Colitis Improved-no pain-no diarrhea-tolerating advancement in diet Stool studies  negative Since clinically improved-all stool studies negative-do not think patient needs any further antibiotics. Stable for discharge home today.     Portal vein thrombus Multiple liver masses-suspicious for hepatocellular carcinoma versus metastatic disease History of HCV Seen incidentally on CT imaging-patient presented with colitis. Managed with IV heparin-IR consulted for potential liver biopsy-however IR wanting to follow tumor [facility] medical oncology opinion before proceeding with biopsy.  Discussed with Dr. Sharyn Creamer will ensure outpatient follow-up with medical oncology, all tumor markers are currently pending and will need to be followed.  IR will continue to follow in the outpatient setting and perform liver biopsy as needed. Will stop IV heparin and transition to Eliquis today.  Patient aware that she will need to hold anticoagulation for at least 2 days prior to any sort of liver biopsy/procedures. Please follow-up HCV viral load and all tumor markers.  Per patient-she claims she was treated for HCV infection was approximately 20 years back.   HLD Statin   HTN Better controlled-continue amlodipine, HCTZ and lisinopril discharge.   PAD Continue statin Continue to hold aspirin as she may need a liver biopsy soon-currently on Eliquis.   COPD Not in flare Continue bronchodilators    BMI: Estimated body mass index is 25.02 kg/m as calculated from the following:   Height as of this encounter: 5\' 6"  (1.676 m).   Weight as of this encounter: 70.3 kg.    Discharge Diagnoses:  Principal Problem:   Colitis Active Problems:   Chronic obstructive pulmonary disease (HCC)   Hypertensive urgency   PAD (peripheral artery disease) (  HCC)   Liver lesion   Portal vein thrombosis   Discharge Instructions:  Activity:  As tolerated   Discharge Instructions     Call MD for:  extreme fatigue   Complete by: As directed    Call MD for:  persistant dizziness or  light-headedness   Complete by: As directed    Call MD for:  persistant nausea and vomiting   Complete by: As directed    Diet - low sodium heart healthy   Complete by: As directed    Diet Carb Modified   Complete by: As directed    Discharge instructions   Complete by: As directed    Follow with Primary MD  Willow Ora, MD in 1-2 weeks  You will get a call to schedule a follow-up appointment with medical oncology  You will get a call to schedule a follow-up appointment with interventional radiology  Please let your primary care practitioner know that hepatitis C viral load, tumor markers are pending and this will need to be followed.  Please get a complete blood count and chemistry panel checked by your Primary MD at your next visit, and again as instructed by your Primary MD.  Get Medicines reviewed and adjusted: Please take all your medications with you for your next visit with your Primary MD  Laboratory/radiological data: Please request your Primary MD to go over all hospital tests and procedure/radiological results at the follow up, please ask your Primary MD to get all Hospital records sent to his/her office.  In some cases, they will be blood work, cultures and biopsy results pending at the time of your discharge. Please request that your primary care M.D. follows up on these results.  Also Note the following: If you experience worsening of your admission symptoms, develop shortness of breath, life threatening emergency, suicidal or homicidal thoughts you must seek medical attention immediately by calling 911 or calling your MD immediately  if symptoms less severe.  You must read complete instructions/literature along with all the possible adverse reactions/side effects for all the Medicines you take and that have been prescribed to you. Take any new Medicines after you have completely understood and accpet all the possible adverse reactions/side effects.   Do not drive  when taking Pain medications or sleeping medications (Benzodaizepines)  Do not take more than prescribed Pain, Sleep and Anxiety Medications. It is not advisable to combine anxiety,sleep and pain medications without talking with your primary care practitioner  Special Instructions: If you have smoked or chewed Tobacco  in the last 2 yrs please stop smoking, stop any regular Alcohol  and or any Recreational drug use.  Wear Seat belts while driving.  Please note: You were cared for by a hospitalist during your hospital stay. Once you are discharged, your primary care physician will handle any further medical issues. Please note that NO REFILLS for any discharge medications will be authorized once you are discharged, as it is imperative that you return to your primary care physician (or establish a relationship with a primary care physician if you do not have one) for your post hospital discharge needs so that they can reassess your need for medications and monitor your lab values.   Increase activity slowly   Complete by: As directed       Allergies as of 07/21/2022   No Known Allergies      Medication List     STOP taking these medications    aspirin EC 81 MG tablet  TAKE these medications    albuterol 108 (90 Base) MCG/ACT inhaler Commonly known as: VENTOLIN HFA INHALE 1 TO 2 PUFFS INTO THE LUNGS EVERY 6 HOURS AS NEEDED FOR WHEEZING OR SHORTNESS OF BREATH What changed: See the new instructions.   amLODipine 5 MG tablet Commonly known as: NORVASC Take 1 tablet (5 mg total) by mouth daily.   apixaban 5 MG Tabs tablet Commonly known as: ELIQUIS Take 2 tablets (10 mg) twice daily x 1 week, after 1 week switch to 1 tablet (5 mg) twice daily   atorvastatin 10 MG tablet Commonly known as: LIPITOR TAKE 1 TABLET(10 MG) BY MOUTH AT BEDTIME What changed: See the new instructions.   lisinopril-hydrochlorothiazide 20-12.5 MG tablet Commonly known as: ZESTORETIC Take 2  tablets by mouth daily.   Spiriva Respimat 2.5 MCG/ACT Aers Generic drug: Tiotropium Bromide Monohydrate INHALE 2 PUFFS INTO THE LUNGS DAILY        Follow-up Information     Willow Ora, MD. Schedule an appointment as soon as possible for a visit in 1 week(s).   Specialty: Family Medicine Contact information: [address] [place] [address] [phone]         Malachy Mood, MD Follow up.   Specialties: Hematology, Oncology Why: Office will call with date/time, If you dont hear from them,please give them a call Contact information: [address] [place] [address] [phone]         [facility] INTERVENTIONAL RADIOLOGY Follow up.   Specialty: Radiology Why: Office will call with date/time, If you dont hear from them,please give them a call Contact information: [address] [id number] mc [place] [address] [phone]               No Known Allergies   Other Procedures/Studies: MR LIVER W WO CONTRAST  Result Date: 07/19/2022 CLINICAL DATA:  Evaluate liver lesions. EXAM: MRI ABDOMEN WITHOUT AND WITH CONTRAST TECHNIQUE: Multiplanar multisequence MR imaging of the abdomen was performed both before and after the administration of intravenous contrast. CONTRAST:  7mL GADAVIST GADOBUTROL 1 MMOL/ML IV SOLN COMPARISON:  CT 07/18/2022 FINDINGS: Lower chest: No acute findings. Hepatobiliary: The postcontrast images on today's study are limited by respiratory motion artifact. Within this limitation, there is extensive tumor identified throughout both lobes of liver. These show apparent arterial phase enhancement, and on the delayed images these become isointense to hypointense. This is better demonstrated on the portal venous phase images from 07/18/2022 and the arterial phase images from the CTA chest, abdomen and pelvis from 07/18/2022. Index lesion within the posterior right hepatic lobe measures 5.3 x 3.6  cm, image 14/5. Within the caudate lobe there is a lesion measuring 4.6 x 2.7 cm, image 13/5. Extensive, thrombus is identified throughout the portal veins which extends into the intrahepatic portal vein into the extrahepatic portal vein. Assessment for enhancing tumor thrombus is limited due to delayed contrast bolus during the arterial phase images as well as excessive motion artifact. Gallbladder appears normal. No bile duct dilatation. Pancreas: No mass, inflammatory changes, or other parenchymal abnormality identified. Spleen:  Within normal limits in size and appearance. Adrenals/Urinary Tract: Normal right adrenal gland. Enhancing left adrenal nodule is identified measuring 1.7 x 1.5 cm, image 39/21. On the exam from 11/20/2021 this measured 1.5 x 1.3 cm this is difficult to further characterize on the in and out of phase sequence is due to excessive motion artifact. No hydronephrosis. Small cyst identified off the upper pole of the  left kidney. No hydronephrosis identified bilaterally. Stomach/Bowel: Stomach appears normal. No dilated loops of large or small bowel. Vascular/Lymphatic: Normal appearance of the abdominal aorta. No enlarged upper abdominal lymph nodes. Other: Mesenteric and bilateral retroperitoneal edema noted. Trace fluid is seen extending along bilateral pericolic gutters. Musculoskeletal: No suspicious bone lesions identified. IMPRESSION: 1. The postcontrast images on today's study are limited by respiratory motion artifact. Within this limitation, there is extensive tumor identified throughout both lobes of liver. These show apparent arterial phase enhancement, and on the delayed images these become isointense to hypointense. This is better demonstrated on the portal venous phase images from 07/18/2022 and the arterial phase images from the CTA chest, abdomen and pelvis from 07/18/2022. In a patient who is at increased risk these findings are strongly suggestive of multifocal  hepatocellular carcinoma. Correlation with alpha fetoprotein levels and possible biopsy. 2. Extensive thrombus is identified throughout the portal veins which extends into the intrahepatic portal vein into the extrahepatic portal vein. Assessment for enhancing tumor thrombus is limited due to delayed contrast bolus during the arterial phase images as well as excessive motion artifact. However, presence of a enhancing tumor thrombus may be seen with hepatocellular carcinoma. 3. Enhancing left adrenal nodule is identified measuring 1.7 x 1.5 cm. On 11/20/2021 this measured 1.5 x 1.3 cm. This is difficult to further characterize on the in and out of phase sequences due to excessive motion artifact. 4. Mesenteric and bilateral retroperitoneal edema. Trace fluid is seen extending along bilateral pericolic gutters. Electronically Signed   By: Signa Kell M.D.   On: 07/19/2022 11:47   CT Angio Chest/Abd/Pel for Dissection W and/or Wo Contrast  Result Date: 07/19/2022 CLINICAL DATA:  Acute aortic syndrome suspected. EXAM: CT ANGIOGRAPHY CHEST, ABDOMEN AND PELVIS TECHNIQUE: Non-contrast CT of the chest was initially obtained. Multidetector CT imaging through the chest, abdomen and pelvis was performed using the standard protocol during bolus administration of intravenous contrast. Multiplanar reconstructed images and MIPs were obtained and reviewed to evaluate the vascular anatomy. RADIATION DOSE REDUCTION: This exam was performed according to the departmental dose-optimization program which includes automated exposure control, adjustment of the mA and/or kV according to patient size and/or use of iterative reconstruction technique. CONTRAST:  OMNIPAQUE IOHEXOL 350 MG/ML SOLN COMPARISON:  11/20/2021, 08/17/2022. FINDINGS: CTA CHEST FINDINGS Cardiovascular: The heart is normal in size and there is a trace pericardial effusion. Scattered coronary artery calcifications are noted. There is atherosclerotic  calcification of the aorta without evidence of aneurysm or dissection. The pulmonary trunk is normal in caliber. Mediastinum/Nodes: No mediastinal, hilar, or axillary lymphadenopathy. A 1 cm hypodensity is noted in the right lobe of the thyroid gland. No follow-up imaging is recommended. The trachea and esophagus are within normal limits. Lungs/Pleura: Centrilobular emphysematous changes are present in the lungs. Atelectasis is present bilaterally. No effusion or pneumothorax. Musculoskeletal: Degenerative changes are present in the thoracic spine. No acute or suspicious osseous abnormality. Review of the MIP images confirms the above findings. CTA ABDOMEN AND PELVIS FINDINGS VASCULAR Aorta: Normal caliber aorta without aneurysm, dissection, vasculitis or significant stenosis. Aortic atherosclerosis. Celiac: Patent without evidence of aneurysm, dissection, vasculitis or significant stenosis. SMA: Patent without evidence of aneurysm, dissection, vasculitis or significant stenosis. Renals: Both renal arteries are patent without evidence of aneurysm, dissection, vasculitis, fibromuscular dysplasia or significant stenosis. IMA: Patent without evidence of aneurysm, dissection, vasculitis or significant stenosis. Inflow: The common iliac and external iliac and internal iliac arteries are patent. The common femoral and deep femoral  arteries are patent bilaterally. There are findings suspicious for high-grade stenosis/occlusion of the proximal superficial femoral arteries bilaterally. Veins: Thrombus is noted in the portal vein with extension into the hepatic veins information multiple collateral vessels at the porta hepatis and gallbladder fossa suggesting collateralization. There are findings suspicious of thrombus in the distal superior mesenteric vein in the right lower quadrant. The splenic vein appears patent. Review of the MIP images confirms the above findings. NON-VASCULAR Hepatobiliary: Multiple hypodense and  hypervascular regions are noted in the liver. No biliary ductal dilatation or stones in the gallbladder. Pancreas: Unremarkable. No pancreatic ductal dilatation or surrounding inflammatory changes. Spleen: Normal in size without focal abnormality. Adrenals/Urinary Tract: There is nodular thickening of the left adrenal gland measuring 1.3 cm. The right adrenal gland is within normal limits. The kidneys enhance symmetrically. Subcentimeter hypodensities are present in the kidneys bilaterally, likely cysts. No renal calculus or hydronephrosis. The bladder is unremarkable. Stomach/Bowel: There is a small hiatal hernia. There is questionable mild thickening of the gastric antrum. Appendix appears normal. No obstruction, free air, or pneumatosis. Scattered areas of colonic wall thickening are noted and most pronounced at the splenic flexure and hepatic flexure with surrounding fat stranding. Lymphatic: No abdominal or pelvic lymphadenopathy by size criteria. Reproductive: Status post hysterectomy. No adnexal masses. Other: Small amount of free fluid is noted in the pelvis. Musculoskeletal: Degenerative changes in the lumbar spine. No acute or suspicious osseous abnormality. Review of the MIP images confirms the above findings. IMPRESSION: 1. No evidence of aortic aneurysm or dissection. 2. Findings suggestive of high-grade stenosis/occlusion of the proximal superficial femoral arteries bilaterally. 3. Multiple hypodense and hypervascular masses in the liver, concerning for metastatic disease. MRI with contrast is recommended for further evaluation. 4. Portal venous thrombus extending into the hepatic veins in the liver with cavernous transformation. There is questionable thrombus involving the distal branch of the superior mesenteric vein in the right lower quadrant. 5. Multifocal colonic wall thickening with surrounding fat stranding, most pronounced at the hepatic and splenic flexures suggesting colitis. The possibility  of underlying neoplasm can not be excluded. Colonoscopy is recommended for further evaluation on follow-up. 6. Indeterminate left adrenal nodule. Adrenal follow-up and workup should be based on liver findings. 7. Small hiatal hernia. There is questionable mild thickening of the walls of the gastric antrum. 8. Emphysema. 9. Remaining incidental findings as described above. Findings were discussed with Dr. Renaye Rakers at 11:45 p.m. Electronically Signed   By: Thornell Sartorius M.D.   On: 07/19/2022 00:06   CT Head Wo Contrast  Result Date: 07/18/2022 CLINICAL DATA:  Headache, new onset (Age >= 51y) EXAM: CT HEAD WITHOUT CONTRAST TECHNIQUE: Contiguous axial images were obtained from the base of the skull through the vertex without intravenous contrast. RADIATION DOSE REDUCTION: This exam was performed according to the departmental dose-optimization program which includes automated exposure control, adjustment of the mA and/or kV according to patient size and/or use of iterative reconstruction technique. COMPARISON:  None Available. FINDINGS: Brain: Normal anatomic configuration. No abnormal intra or extra-axial mass lesion or fluid collection. No abnormal mass effect or midline shift. No evidence of acute intracranial hemorrhage or infarct. Ventricular size is normal. Cerebellum unremarkable. Vascular: Unremarkable Skull: Intact Sinuses/Orbits: Paranasal sinuses are clear. Orbits are unremarkable. Other: Mastoid air cells and middle ear cavities are clear. IMPRESSION: 1. No acute intracranial abnormality. Electronically Signed   By: Helyn Numbers M.D.   On: 07/18/2022 23:35   CT ABDOMEN PELVIS W CONTRAST  Result Date:  07/18/2022 CLINICAL DATA:  Lower abdominal tenderness. EXAM: CT ABDOMEN AND PELVIS WITH CONTRAST TECHNIQUE: Multidetector CT imaging of the abdomen and pelvis was performed using the standard protocol following bolus administration of intravenous contrast. RADIATION DOSE REDUCTION: This exam was performed  according to the departmental dose-optimization program which includes automated exposure control, adjustment of the mA and/or kV according to patient size and/or use of iterative reconstruction technique. CONTRAST:  75mL OMNIPAQUE IOHEXOL 350 MG/ML SOLN COMPARISON:  Abdominal ultrasound 03/26/2016. No prior CT available. FINDINGS: Lower chest: Hypoventilatory changes. No basilar airspace disease. Hepatobiliary: The liver is diffusely heterogeneous with multiple low-density liver lesions. Largest lesion is in the posterior right hepatic lobe measuring 4.4 cm, series 3, image 25. There is thrombus within the portal vein at the porta hepatis extending into the liver. Cavernous transformation of the portal vein is seen. Gallbladder physiologically distended, no calcified stone. No biliary dilatation. Pancreas: There is mild heterogeneity in the pancreatic head without definite lesion. No ductal dilatation or inflammation. Spleen: No splenomegaly. There is a cleft in the spleen. Lobulated splenic contours. Adrenals/Urinary Tract: 14 mm left adrenal nodule. Normal right adrenal gland. No hydronephrosis. Tiny hypodensities in both kidneys are too small to characterize but likely small cysts. No evidence of solid renal lesion. Unremarkable appearance of bladder. Stomach/Bowel: Small hiatal hernia. Equivocal distal gastric wall thickening. No small bowel inflammation. The appendix is normal. Scattered areas of colonic wall thickening throughout multiple colonic segments. No evidence colonic mass. Vascular/Lymphatic: Aortic atherosclerosis without aneurysm. Nonocclusive thrombus within the main portal vein extending into the intrahepatic portal veins. No propagation into the superior mesenteric or splenic veins. Scattered retroperitoneal and periportal lymph nodes. Reproductive: Uterus and bilateral adnexa are unremarkable. Other: Small volume abdominopelvic ascites. No free air. Musculoskeletal: Multilevel degenerative disc  disease and facet hypertrophy. No suspicious bone lesion or acute osseous findings. IMPRESSION: 1. Heterogeneous liver with multiple low-density liver lesions, largest measuring 4.4 cm. Findings are suspicious for metastatic disease. Possibility of primary hepatic malignancy is raised in the setting of chronic hepatitis C. Recommend GI consultation and consideration for hepatic protocol MRI. 2. Nonocclusive thrombus within the main portal vein extending into the intrahepatic portal veins. Cavernous transformation of the portal vein. 3. Scattered areas of colonic wall thickening throughout multiple colonic segments, nondistesion versus colitis. 4. Small volume abdominopelvic ascites. 5. Indeterminate 14 mm left adrenal nodule. Adrenal follow-up and workup should be based on liver findings. 6. Small hiatal hernia. Equivocal distal gastric wall thickening. Aortic Atherosclerosis (ICD10-I70.0). These results were called by telephone at the time of interpretation on 07/18/2022 at 10:17 pm to provider KEN LE , who verbally acknowledged these results. Electronically Signed   By: Narda Rutherford M.D.   On: 07/18/2022 22:17     TODAY-DAY OF DISCHARGE:  Subjective:   Christine Hurley today has no headache,no chest abdominal pain,no new weakness tingling or numbness, feels much better wants to go home today.   Objective:   Blood pressure (!) 168/114, pulse 81, temperature 98.5 F (36.9 C), temperature source Oral, resp. rate 15, height 5\' 6"  (1.676 m), weight 70.3 kg, SpO2 95 %.  Intake/Output Summary (Last 24 hours) at 07/21/2022 1053 Last data filed at 07/21/2022 0543 Gross per 24 hour  Intake 1097.11 ml  Output --  Net 1097.11 ml   Filed Weights   07/18/22 2004  Weight: 70.3 kg    Exam: Awake Alert, Oriented *3, No new F.N deficits, Normal affect [place].AT,PERRAL Supple Neck,No JVD, No cervical lymphadenopathy appriciated.  Symmetrical  Chest wall movement, Good air movement bilaterally, CTAB RRR,No  Gallops,Rubs or new Murmurs, No Parasternal Heave +ve B.Sounds, Abd Soft, Non tender, No organomegaly appriciated, No rebound -guarding or rigidity. No Cyanosis, Clubbing or edema, No new Rash or bruise   PERTINENT RADIOLOGIC STUDIES: No results found.   PERTINENT LAB RESULTS: CBC: Recent Labs    07/20/22 0436 07/21/22 0445  WBC 6.0 5.8  HGB 11.7* 11.8*  HCT 35.4* 35.6*  PLT 180 185   CMET CMP     Component Value Date/Time   NA 134 (L) 07/21/2022 0445   K 3.2 (L) 07/21/2022 0445   CL 103 07/21/2022 0445   CO2 21 (L) 07/21/2022 0445   GLUCOSE 107 (H) 07/21/2022 0445   BUN 11 07/21/2022 0445   CREATININE 0.76 07/21/2022 0445   CREATININE 0.73 12/18/2017 1525   CALCIUM 8.8 (L) 07/21/2022 0445   PROT 6.8 07/21/2022 0445   ALBUMIN 3.1 (L) 07/21/2022 0445   AST 53 (H) 07/21/2022 0445   ALT 86 (H) 07/21/2022 0445   ALKPHOS 217 (H) 07/21/2022 0445   BILITOT 0.5 07/21/2022 0445   GFR 67.65 05/14/2020 1452   GFRNONAA >60 07/21/2022 0445    GFR Estimated Creatinine Clearance: 59.5 mL/min (by C-G formula based on SCr of 0.76 mg/dL). Recent Labs    07/18/22 2042  LIPASE 37   No results for input(s): "CKTOTAL", "CKMB", "CKMBINDEX", "TROPONINI" in the last 72 hours. Invalid input(s): "POCBNP" No results for input(s): "DDIMER" in the last 72 hours. No results for input(s): "HGBA1C" in the last 72 hours. No results for input(s): "CHOL", "HDL", "LDLCALC", "TRIG", "CHOLHDL", "LDLDIRECT" in the last 72 hours. No results for input(s): "TSH", "T4TOTAL", "T3FREE", "THYROIDAB" in the last 72 hours.  Invalid input(s): "FREET3" No results for input(s): "VITAMINB12", "FOLATE", "FERRITIN", "TIBC", "IRON", "RETICCTPCT" in the last 72 hours. Coags: Recent Labs    07/19/22 1109  INR 1.1   Microbiology: Recent Results (from the past 240 hour(s))  Gastrointestinal Panel by PCR , Stool     Status: None   Collection Time: 07/19/22  1:52 PM   Specimen: STOOL  Result Value Ref Range  Status   Campylobacter species NOT DETECTED NOT DETECTED Final   Plesimonas shigelloides NOT DETECTED NOT DETECTED Final   Salmonella species NOT DETECTED NOT DETECTED Final   Yersinia enterocolitica NOT DETECTED NOT DETECTED Final   Vibrio species NOT DETECTED NOT DETECTED Final   Vibrio cholerae NOT DETECTED NOT DETECTED Final   Enteroaggregative E coli (EAEC) NOT DETECTED NOT DETECTED Final   Enteropathogenic E coli (EPEC) NOT DETECTED NOT DETECTED Final   Enterotoxigenic E coli (ETEC) NOT DETECTED NOT DETECTED Final   Shiga like toxin producing E coli (STEC) NOT DETECTED NOT DETECTED Final   Shigella/Enteroinvasive E coli (EIEC) NOT DETECTED NOT DETECTED Final   Cryptosporidium NOT DETECTED NOT DETECTED Final   Cyclospora cayetanensis NOT DETECTED NOT DETECTED Final   Entamoeba histolytica NOT DETECTED NOT DETECTED Final   Giardia lamblia NOT DETECTED NOT DETECTED Final   Adenovirus F40/41 NOT DETECTED NOT DETECTED Final   Astrovirus NOT DETECTED NOT DETECTED Final   Norovirus GI/GII NOT DETECTED NOT DETECTED Final   Rotavirus A NOT DETECTED NOT DETECTED Final   Sapovirus (I, II, IV, and V) NOT DETECTED NOT DETECTED Final    Comment: Performed at [facility], [address], [place], [place] [address]  C Difficile Quick Screen w PCR reflex     Status: None   Collection Time: 07/19/22  1:52  PM   Specimen: STOOL  Result Value Ref Range Status   C Diff antigen NEGATIVE NEGATIVE Final   C Diff toxin NEGATIVE NEGATIVE Final   C Diff interpretation No C. difficile detected.  Final    Comment: Performed at [facility], [address] [place], [place] [address]    FURTHER DISCHARGE INSTRUCTIONS:  Get Medicines reviewed and adjusted: Please take all your medications with you for your next visit with your Primary MD  Laboratory/radiological data: Please request your Primary MD to go over all hospital tests and procedure/radiological results at the follow up, please  ask your Primary MD to get all Hospital records sent to his/her office.  In some cases, they will be blood work, cultures and biopsy results pending at the time of your discharge. Please request that your primary care M.D. goes through all the records of your hospital data and follows up on these results.  Also Note the following: If you experience worsening of your admission symptoms, develop shortness of breath, life threatening emergency, suicidal or homicidal thoughts you must seek medical attention immediately by calling 911 or calling your MD immediately  if symptoms less severe.  You must read complete instructions/literature along with all the possible adverse reactions/side effects for all the Medicines you take and that have been prescribed to you. Take any new Medicines after you have completely understood and accpet all the possible adverse reactions/side effects.   Do not drive when taking Pain medications or sleeping medications (Benzodaizepines)  Do not take more than prescribed Pain, Sleep and Anxiety Medications. It is not advisable to combine anxiety,sleep and pain medications without talking with your primary care practitioner  Special Instructions: If you have smoked or chewed Tobacco  in the last 2 yrs please stop smoking, stop any regular Alcohol  and or any Recreational drug use.  Wear Seat belts while driving.  Please note: You were cared for by a hospitalist during your hospital stay. Once you are discharged, your primary care physician will handle any further medical issues. Please note that NO REFILLS for any discharge medications will be authorized once you are discharged, as it is imperative that you return to your primary care physician (or establish a relationship with a primary care physician if you do not have one) for your post hospital discharge needs so that they can reassess your need for medications and monitor your lab values.  Total Time spent coordinating  discharge including counseling, education and face to face time equals greater than 30 minutes.  SignedJeoffrey Massed 07/21/2022 10:53 AM

## 2022-07-21 NOTE — TOC Transition Note (Signed)
Transition of Care [facility]) - CM/SW Discharge Note   Patient Details  Name: Christine Hurley MRN: [id number] Date of Birth: 1950/11/16  [facility]) CM/SW Contact:  Gordy Clement, RN Phone Number: 07/21/2022, 11:55 AM   Clinical Narrative:     Patient to DC to home  No TOC needs have been identified. Patient will follow up as instructed in AVS  TOC to sign off.           Patient Goals and CMS Choice      Discharge Placement                         Discharge Plan and Services Additional resources added to the After Visit Summary for                                       Social Determinants of Health (SDOH) Interventions SDOH Screenings   Food Insecurity: No Food Insecurity (07/19/2022)  Housing: Low Risk  (07/19/2022)  Transportation Needs: No Transportation Needs (07/19/2022)  Utilities: Not At Risk (07/19/2022)  Depression (PHQ2-9): Low Risk  (03/13/2022)  Financial Resource Strain: Low Risk  (03/13/2022)  Physical Activity: Inactive (03/13/2022)  Social Connections: Moderately Isolated (03/13/2022)  Stress: No Stress Concern Present (03/13/2022)  Tobacco Use: High Risk (07/19/2022)     Readmission Risk Interventions     No data to display

## 2022-07-21 NOTE — Progress Notes (Signed)
ANTICOAGULATION CONSULT NOTE  Pharmacy Consult for heparin>>apixaban Indication:  portal vein thrombosis  No Known Allergies  Patient Measurements: Height: 5\' 6"  (167.6 cm) Weight: 70.3 kg (155 lb) IBW/kg (Calculated) : 59.3  Vital Signs: Temp: 98.5 F (36.9 C) (06/10 0814) Temp Source: Oral (06/10 0814) BP: 168/114 (06/10 0814) Pulse Rate: 81 (06/10 0334)  Labs: Recent Labs    07/18/22 2042 07/19/22 0029 07/19/22 0225 07/19/22 0813 07/19/22 1109 07/19/22 1607 07/20/22 0436 07/21/22 0445  HGB 13.3  --   --   --   --   --  11.7* 11.8*  HCT 40.1  --   --   --   --   --  35.4* 35.6*  PLT 200  --   --   --   --   --  180 185  LABPROT  --   --   --   --  13.9  --   --   --   INR  --   --   --   --  1.1  --   --   --   HEPARINUNFRC  --   --   --    < >  --  0.56 0.65 0.66  CREATININE 0.78  --  0.74  --   --   --  0.70 0.76  TROPONINIHS 5 4  --   --   --   --   --   --    < > = values in this interval not displayed.    Estimated Creatinine Clearance: 59.5 mL/min (by C-G formula based on SCr of 0.76 mg/dL).  Medical History: Past Medical History:  Diagnosis Date   Abnormal nuclear stress test February '13   Small, partially reversible inferolateral ischemia. Marked hypertensive response to exercise. Low risk.   Arthritis    Chest pain with minimal risk for cardiac etiology February 2013   Evaluated with echocardiogram and Myoview, do   COPD (chronic obstructive pulmonary disease) (HCC)    Depression    Hepatitis C    Hypertension    Osteopenia of spine 05/22/2016   PAD (peripheral artery disease) (HCC) 10/01/2021   ABI: monophasic and decreased ABIs. Refer to vascular   Right tibial fracture     Assessment: 72yo female c/o abdominal pain, vomiting, and dizziness x2d, CT reveals portal vein thrombosis >> to begin heparin. No anticoagulants reported PTA or in fill hx.   We are transitioning to apixaban today for discharge. We will also increase he Norvasc for BP  control.   Goal of Therapy:  Monitor platelets by anticoagulation protocol: Yes   Plan:  Dc heparin Apixaban 10mg  PO BID x7d then 5mg  PO BID Rx will follow peripherally  Ulyses Southward, PharmD, BCIDP, AAHIVP, CPP Infectious Disease Pharmacist 07/21/2022 10:27 AM

## 2022-07-21 NOTE — Discharge Instructions (Signed)
Information on my medicine - ELIQUIS (apixaban)  This medication education was reviewed with me or my healthcare representative as part of my discharge preparation.  The pharmacist that spoke with me during my hospital stay was:    Why was Eliquis prescribed for you? Eliquis was prescribed to treat blood clots that may have been found in the veins of your legs (deep vein thrombosis) or in your lungs (pulmonary embolism) and to reduce the risk of them occurring again.  What do You need to know about Eliquis ? The starting dose is 10 mg (two 5 mg tablets) taken TWICE daily for the FIRST SEVEN (7) DAYS, then on (enter date)  07/28/22  the dose is reduced to ONE 5 mg tablet taken TWICE daily.  Eliquis may be taken with or without food.   Try to take the dose about the same time in the morning and in the evening. If you have difficulty swallowing the tablet whole please discuss with your pharmacist how to take the medication safely.  Take Eliquis exactly as prescribed and DO NOT stop taking Eliquis without talking to the doctor who prescribed the medication.  Stopping may increase your risk of developing a new blood clot.  Refill your prescription before you run out.  After discharge, you should have regular check-up appointments with your healthcare provider that is prescribing your Eliquis.    What do you do if you miss a dose? If a dose of ELIQUIS is not taken at the scheduled time, take it as soon as possible on the same day and twice-daily administration should be resumed. The dose should not be doubled to make up for a missed dose.  Important Safety Information A possible side effect of Eliquis is bleeding. You should call your healthcare provider right away if you experience any of the following: Bleeding from an injury or your nose that does not stop. Unusual colored urine (red or dark brown) or unusual colored stools (red or black). Unusual bruising for unknown reasons. A  serious fall or if you hit your head (even if there is no bleeding).  Some medicines may interact with Eliquis and might increase your risk of bleeding or clotting while on Eliquis. To help avoid this, consult your healthcare provider or pharmacist prior to using any new prescription or non-prescription medications, including herbals, vitamins, non-steroidal anti-inflammatory drugs (NSAIDs) and supplements.  This website has more information on Eliquis (apixaban): http://www.eliquis.com/eliquis/home

## 2022-07-21 NOTE — Progress Notes (Signed)
Attempted to discuss about the outpatient liver lesion bx, but patient was discharged already.   Central Scheduling has notified, patient will be scheduled for the liver lesion biopsy after AFP is resulted and her visit with Dr. [name] on 6/13 3 pm.   She will be given the instruction by the Central Scheduling team.  Please call IR for questions and concerns.   Lynann Bologna Tymar Polyak PA-C 07/21/2022 3:37 PM

## 2022-07-21 NOTE — Telephone Encounter (Signed)
scheduled per referral, pt has been called and confirmed date and time. Pt is aware of location and to arrive early for check in   

## 2022-07-22 ENCOUNTER — Ambulatory Visit: Payer: Self-pay

## 2022-07-22 ENCOUNTER — Telehealth: Payer: Self-pay

## 2022-07-22 LAB — AFP TUMOR MARKER: AFP, Serum, Tumor Marker: 80.2 ng/mL — ABNORMAL HIGH (ref 0.0–9.2)

## 2022-07-22 LAB — CEA: CEA: 2.4 ng/mL (ref 0.0–4.7)

## 2022-07-22 LAB — CANCER ANTIGEN 19-9: CA 19-9: <2 U/mL (ref 0–35)

## 2022-07-22 LAB — CA 125: Cancer Antigen (CA) 125: 126 U/mL — ABNORMAL HIGH (ref 0.0–38.1)

## 2022-07-22 NOTE — Chronic Care Management (AMB) (Signed)
   07/22/2022  Christine Hurley 25-Jan-1951 [id number]  Reason for Encounter: Patient no longer enrolled in CCM services. CCM enrollment status updated.   [name]/Chronic Care Management [phone]

## 2022-07-22 NOTE — Transitions of Care (Post Inpatient/ED Visit) (Signed)
07/22/2022  Name: Christine Hurley MRN: [id number] DOB: 21-Jul-1950  Today's TOC FU Call Status: Today's TOC FU Call Status:: Successful TOC FU Call Competed TOC FU Call Complete Date: 07/22/22  Transition Care Management Follow-up Telephone Call Date of Discharge: 07/21/22 Discharge Facility: [facility]) Type of Discharge: Inpatient Admission Primary Inpatient Discharge Diagnosis:: "lower abd pain,liver lesion" How have you been since you were released from the hospital?: Better (Pt states things going well-rested well last night. Appetite good-eating small,freq meals. She was having diarrhea in hospital but it has "just about all cleared up today.") Any questions or concerns?: No  Items Reviewed: Did you receive and understand the discharge instructions provided?: Yes Medications obtained,verified, and reconciled?: Yes (Medications Reviewed) Any new allergies since your discharge?: No Dietary orders reviewed?: Yes Type of Diet Ordered:: low salt/heart healthy Do you have support at home?: Yes People in Home: alone  Medications Reviewed Today: Medications Reviewed Today     Reviewed by Charlyn Minerva, RN (Registered Nurse) on 07/22/22 at 1341  Med List Status: <None>   Medication Order Taking? Sig Documenting Provider Last Dose Status Informant  albuterol (VENTOLIN HFA) 108 (90 Base) MCG/ACT inhaler [id number] Yes INHALE 1 TO 2 PUFFS INTO THE LUNGS EVERY 6 HOURS AS NEEDED FOR WHEEZING OR SHORTNESS OF BREATH  Patient taking differently: Inhale 2 puffs into the lungs every 6 (six) hours as needed for wheezing or shortness of breath.   Willow Ora, MD Taking Active Self, Pharmacy Records  amLODipine [facility]) 5 MG tablet [id number] Yes Take 1 tablet (5 mg total) by mouth daily. Ghimire, Werner Lean, MD Taking Active   apixaban (ELIQUIS) 5 MG TABS tablet [id number] Yes Take 1 tablet (5mg ) by mouth twice daily Ghimire, Werner Lean, MD Taking Active   APIXABAN Everlene Balls)  VTE STARTER PACK (10MG  AND 5MG ) [id number] Yes Take 2 tablets (10 mg) twice daily x 1 week, after 1 week switch to 1 tablet (5 mg) twice daily Ghimire, Werner Lean, MD Taking Active   atorvastatin (LIPITOR) 10 MG tablet [id number] Yes TAKE 1 TABLET(10 MG) BY MOUTH AT BEDTIME  Patient taking differently: Take 10 mg by mouth daily.   Willow Ora, MD Taking Active Self, Pharmacy Records  lisinopril-hydrochlorothiazide (ZESTORETIC) 20-12.5 MG tablet [id number] Yes Take 2 tablets by mouth daily. Willow Ora, MD Taking Active Self, Pharmacy Records  [facility] RESPIMAT 2.5 MCG/ACT AERS [id number] Yes INHALE 2 PUFFS INTO THE LUNGS DAILY Willow Ora, MD Taking Active Self, Pharmacy Records            Home Care and Equipment/Supplies: Were Home Health Services Ordered?: NA Any new equipment or medical supplies ordered?: NA  Functional Questionnaire: Do you need assistance with bathing/showering or dressing?: No Do you need assistance with meal preparation?: No Do you need assistance with eating?: No Do you have difficulty maintaining continence: No Do you need assistance with getting out of bed/getting out of a chair/moving?: No Do you have difficulty managing or taking your medications?: No  Follow up appointments reviewed: PCP Follow-up appointment confirmed?: Yes Date of PCP follow-up appointment?: 07/28/22 Follow-up Provider: Dr. Mardelle Matte [facility] Follow-up appointment confirmed?: Yes Date of Specialist follow-up appointment?: 07/24/22 Follow-Up Specialty Provider:: Dr. [name] Do you need transportation to your follow-up appointment?: No (pt confirms she is able to drive herself to appts) Do you understand care options if your condition(s) worsen?: Yes-patient verbalized understanding  SDOH Interventions Today    Flowsheet Row Most Recent Value  SDOH Interventions  Food Insecurity Interventions Intervention Not Indicated  Transportation Interventions Intervention Not  Indicated      TOC Interventions Today    Flowsheet Row Most Recent Value  TOC Interventions   TOC Interventions Discussed/Reviewed TOC Interventions Discussed, Arranged PCP follow up within 7 days/Care Guide scheduled      Interventions Today    Flowsheet Row Most Recent Value  General Interventions   General Interventions Discussed/Reviewed General Interventions Discussed, Doctor Visits  Doctor Visits Discussed/Reviewed Doctor Visits Discussed, Specialist, PCP  PCP/Specialist Visits Compliance with follow-up visit  Education Interventions   Education Provided Provided Education  Provided Verbal Education On Nutrition, When to see the doctor, Medication  Nutrition Interventions   Nutrition Discussed/Reviewed Nutrition Discussed, Adding fruits and vegetables, Decreasing salt, Fluid intake  Pharmacy Interventions   Pharmacy Dicussed/Reviewed Pharmacy Topics Discussed, Medications and their functions  Safety Interventions   Safety Discussed/Reviewed Safety Discussed       Alessandra Grout [facility] Care Management Care Management Community Coordinator Direct Phone: [phone] Toll Free: [phone] Fax: [phone]

## 2022-07-24 ENCOUNTER — Inpatient Hospital Stay: Payer: Medicare HMO

## 2022-07-24 ENCOUNTER — Encounter: Payer: Self-pay | Admitting: Hematology

## 2022-07-24 ENCOUNTER — Inpatient Hospital Stay: Payer: Medicare HMO | Attending: Hematology | Admitting: Hematology

## 2022-07-24 ENCOUNTER — Other Ambulatory Visit: Payer: Self-pay

## 2022-07-24 VITALS — BP 151/92 | HR 85 | Temp 97.6°F | Resp 18 | Ht 64.0 in | Wt 146.5 lb

## 2022-07-24 DIAGNOSIS — F17210 Nicotine dependence, cigarettes, uncomplicated: Secondary | ICD-10-CM | POA: Insufficient documentation

## 2022-07-24 DIAGNOSIS — Z780 Asymptomatic menopausal state: Secondary | ICD-10-CM | POA: Insufficient documentation

## 2022-07-24 DIAGNOSIS — F1290 Cannabis use, unspecified, uncomplicated: Secondary | ICD-10-CM | POA: Diagnosis not present

## 2022-07-24 DIAGNOSIS — C220 Liver cell carcinoma: Secondary | ICD-10-CM | POA: Diagnosis not present

## 2022-07-24 DIAGNOSIS — R197 Diarrhea, unspecified: Secondary | ICD-10-CM | POA: Diagnosis not present

## 2022-07-24 MED ORDER — PROCHLORPERAZINE MALEATE 10 MG PO TABS
10.0000 mg | ORAL_TABLET | Freq: Four times a day (QID) | ORAL | 1 refills | Status: DC | PRN
Start: 2022-07-24 — End: 2022-09-12

## 2022-07-24 NOTE — Progress Notes (Signed)
START OFF PATHWAY REGIMEN - Other   [id number]:Durvalumab 1,500 mg IV D1 + Tremelimumab 300 mg IV D1 q28 Days x 1 Cycle Followed by Durvalumab 1,500 mg IV D1 q28 Days:   Cycle 1: A cycle is 28 days:     Tremelimumab-actl      Durvalumab    Cycles 2 and beyond: A cycle is every 28 days:     Durvalumab   **Always confirm dose/schedule in your pharmacy ordering system**  Patient Characteristics: Intent of Therapy: Non-Curative / Palliative Intent, Discussed with Patient 

## 2022-07-24 NOTE — Progress Notes (Signed)
I met with Ms Starnes after  her consultation with Dr [name].  I explained my role as a nurse navigator and provided my contact information. I explained the services provided at [facility] and provided written information.  I explained the alight grant and let  her know she will receive additional information regarding the grant at the time of her chemo education class. I told her that she will be scheduled for chemotherapy education class prior to receiving chemotherapy.  I told her our schedulers will call her with those appts.  All questions were answered. She verbalized understanding.

## 2022-07-24 NOTE — Progress Notes (Addendum)
[facility]   Telephone:([phone] Fax:([phone]) [phone]   Clinic New Consult Note   Patient Care Team: Willow Ora, MD as PCP - General (Family Medicine) Willis Modena, MD as Consulting Physician (Gastroenterology) Aura Camps, MD as Consulting Physician (Ophthalmology) Aris Lot, MD as Consulting Physician (Dermatology) Dahlia Byes, [facility] (Inactive) as Pharmacist (Pharmacist)  Date of Service:  07/24/2022   CHIEF COMPLAINTS/PURPOSE OF CONSULTATION:  Liver Lesion  REFERRING PHYSICIAN:  Han,Aimee, PA-C  ASSESSMENT & PLAN:  Christine Hurley is a 72 y.o.  female with a history of   1. Hepatocellular carcinoma with liver metastasis, cT3N0M0, stage IIIA -Patient was recently hospitalized for colitis, CT scan showed multiple liver masses -Liver MRI on July 19, 2022 showed extensive tumor identified throughout both lobes of the liver, radiographic characteristic are not consistent with hepatocellular carcinoma, her AFP was 85, with her history of hepatitis C, this is diagnostic for multifocal HCC or HCC with intrahepatic metastasis -I do not think she needs liver biopsy. -I will review her case in our GI tumor board next week. -I discussed the incurable nature of her liver cancer, due to the diffuse involvement of both lobes.  She is not a candidate for liver resection or liver transplant. -I discussed treatment options, including systemic therapy, such as immunotherapy and TKIs, and liver targeted therapy, such as radioembolization.  Given the extensive disease, I recommend both systemic therapy and liver targeted therapy together. -I discussed the first-line systemic therapy, including atezolizumab and bevacizumab, or durvalumab and Imjudo, both are category 1 recommendation according to NCCN guideline.  Given her extensive portal vein thrombosis, I recommend durvalumab and Imjudo --Therapy consent: Side effects including but does not not limited to, fatigue,  pneumonitis, colitis, thyroid and other endocrine disorders, skin rash, arthralgia, hepatitis, nephritis, autoimmune related cytopenias and neurotoxicity risk, with small possibility of fetal side effects, were discussed with patient in great detail. she agrees to proceed. -The goal of therapy is palliative, for disease control and prolong her life. -She has a beach trip coming in a few weeks, plan to start treatment when she returns -will refer her to IR   2. Treated Hep C -Will monitor.  Hepatitis C virus RNA was negative  -Recent MRI showed no definitive evidence of liver cirrhosis,    PLAN:  -reviewed lab and liver MR with  pt.-consistent with HCC  -will review scan in Conference -No biopsy needed -Surgery/radiation is not an option - Discuss Liver target therapy such as Y90 and its side effects - Recommend  immunotherapy with durvalumab and Imjudo  -schedule Chemo education  -I recommend Port Placement when she has poor IV access  -starting treatment  approximately 7/5  Oncology History  Hepatocellular carcinoma (HCC)  07/24/2022 Initial Diagnosis   Hepatocellular carcinoma (HCC)   07/24/2022 Cancer Staging   Staging form: Liver, AJCC 8th Edition - Clinical stage from 07/24/2022: Stage IIIA (cT3, cN0, cM0) - Signed by Malachy Mood, MD on 07/24/2022   08/15/2022 -  Chemotherapy   Patient is on Treatment Plan : Hepatocellular Carcinoma Tremelimumab-actl C1 D1 + Durvalumab q28d         HISTORY OF PRESENTING ILLNESS:  Christine Hurley 72 y.o. female is a here because of Liver masses. The patient was referred by Han,Aimee H,PA-C. The patient presents to the clinic today alone.    Pt went to the hospital on 07/18/2022 due to sudden onset nausea, vomiting, and diarrhea, because she ate something that was left out over.  She  was found to have colitis and subsequently admitted to hospital for management.  CT abdomen pelvis was obtained, which showed colitis, and multiple liver lesions,  suspicious for metastasis.  Nonocclusive portal vein thrombosis was also seen on the CT scan.  She was started with anticoagulation.  CT angio of abdomen pelvis showed high-grade stenosis/occlusion of superficial femoral arteries bilaterally.  Abdominal MRI on July 19, 2022 showed extensive tumor is throughout both lobes, radiographic evidence was consistent with hepatocellular carcinoma.  Liver biopsy was discussed but deferred.  Patient clinically improved after treatment of colitis, and was discharged home.  Pt has since recovered but she still has mild diarrhea. Pt state that her energy level is very low. Pt state that the fatigue from the blood thinner is impacting her ADL'S   She has a PMHx of.... Arthritis COPD Hypertension  HEP C Mother -brain cancer Maternal grandmother -cancer of the reproductive area  Socially... Divorced 1 child -smoker -recreational -marijuana  REVIEW OF SYSTEMS:    Constitutional: (-) Denies fevers, chills or abnormal night sweats Eyes: (-)Denies blurriness of vision, double vision or watery eyes Ears, nose, mouth, throat, and face: Denies mucositis or sore throat Respiratory:(-) Denies cough, dyspnea or wheezes Cardiovascular: (-)Denies palpitation, chest discomfort or lower extremity swelling Gastrointestinal: (+) Denies nausea, heartburn or (+)change in bowel habits Skin: (-)Denies abnormal skin rashes Lymphatics:(-) Denies new lymphadenopathy or easy bruising Neurological:(-)Denies numbness, tingling or new weaknesses Behavioral/Psych: (-)Mood is stable, no new changes  All other systems were reviewed with the patient and are negative.   MEDICAL HISTORY:  Past Medical History:  Diagnosis Date   Abnormal nuclear stress test February '13   Small, partially reversible inferolateral ischemia. Marked hypertensive response to exercise. Low risk.   Arthritis    Chest pain with minimal risk for cardiac etiology February 2013   Evaluated with  echocardiogram and Myoview, do   COPD (chronic obstructive pulmonary disease) (HCC)    Depression    Hepatitis C    Hypertension    Osteopenia of spine 05/22/2016   PAD (peripheral artery disease) (HCC) 10/01/2021   ABI: monophasic and decreased ABIs. Refer to vascular   Right tibial fracture      SURGICAL HISTORY: Past Surgical History:  Procedure Laterality Date   DOPPLER ECHOCARDIOGRAPHY  03/26/2011   LVEF>55% normal LV wall thickness, normal LA, mild aortic sclerosis with trace to mild AI, mild to moderate TR, RVSP of , RA pressure about 5 mmHg   NM MYOVIEW LTD  03/27/11   post stress ejection fraction is 72%, abnormal myocardial perfusion study, this is a low risk scan   TIBIA FRACTURE SURGERY Right 09/2008   fracture of right lower leg with open reduction and internal fixation    SOCIAL HISTORY: Social History   Socioeconomic History   Marital status: Divorced    Spouse name: Not on file   Number of children: 1   Years of education: Not on file   Highest education level: Not on file  Occupational History   Not on file  Tobacco Use   Smoking status: Every Day    Packs/day: 0.25    Years: 45.00    Additional pack years: 0.00    Total pack years: 11.25    Types: Cigarettes   Smokeless tobacco: Never   Tobacco comments:    2 cigs per day   Vaping Use   Vaping Use: Never used  Substance and Sexual Activity   Alcohol use: Yes    Alcohol/week: 2.0  standard drinks of alcohol    Types: 2 Cans of beer per week    Comment: socially    Drug use: Not Currently    Types: Marijuana    Comment: 2 days ago    Sexual activity: Yes    Birth control/protection: Post-menopausal  Other Topics Concern   Not on file  Social History Narrative   Not on file   Social Determinants of Health   Financial Resource Strain: Low Risk  (03/13/2022)   Overall Financial Resource Strain (CARDIA)    Difficulty of Paying Living Expenses: Not hard at all  Food Insecurity: No Food  Insecurity (07/22/2022)   Hunger Vital Sign    Worried About Running Out of Food in the Last Year: Never true    Ran Out of Food in the Last Year: Never true  Transportation Needs: No Transportation Needs (07/22/2022)   PRAPARE - Administrator, Civil Service (Medical): No    Lack of Transportation (Non-Medical): No  Physical Activity: Inactive (03/13/2022)   Exercise Vital Sign    Days of Exercise per Week: 0 days    Minutes of Exercise per Session: 0 min  Stress: No Stress Concern Present (03/13/2022)   [employer] of Occupational Health - Occupational Stress Questionnaire    Feeling of Stress : Not at all  Social Connections: Moderately Isolated (03/13/2022)   Social Connection and Isolation Panel [NHANES]    Frequency of Communication with Friends and Family: More than three times a week    Frequency of Social Gatherings with Friends and Family: More than three times a week    Attends Religious Services: More than 4 times per year    Active Member of [employer] or Organizations: No    Attends Banker Meetings: Never    Marital Status: Divorced  Catering manager Violence: Not At Risk (07/19/2022)   Humiliation, Afraid, Rape, and Kick questionnaire    Fear of Current or Ex-Partner: No    Emotionally Abused: No    Physically Abused: No    Sexually Abused: No    FAMILY HISTORY: Family History  Problem Relation Age of Onset   Asthma Mother    Cancer Mother        Brain   Cancer - Prostate Father    Cancer Maternal Grandmother        GYN cancer    ALLERGIES:  has No Known Allergies.  MEDICATIONS:  Current Outpatient Medications  Medication Sig Dispense Refill   albuterol (VENTOLIN HFA) 108 (90 Base) MCG/ACT inhaler INHALE 1 TO 2 PUFFS INTO THE LUNGS EVERY 6 HOURS AS NEEDED FOR WHEEZING OR SHORTNESS OF BREATH (Patient taking differently: Inhale 2 puffs into the lungs every 6 (six) hours as needed for wheezing or shortness of breath.) 6.7 g 2   amLODipine  (NORVASC) 5 MG tablet Take 1 tablet (5 mg total) by mouth daily. 30 tablet 0   apixaban (ELIQUIS) 5 MG TABS tablet Take 1 tablet (5mg ) by mouth twice daily 90 tablet 1   APIXABAN (ELIQUIS) VTE STARTER PACK (10MG  AND 5MG ) Take 2 tablets (10 mg) twice daily x 1 week, after 1 week switch to 1 tablet (5 mg) twice daily 74 tablet 0   atorvastatin (LIPITOR) 10 MG tablet TAKE 1 TABLET(10 MG) BY MOUTH AT BEDTIME (Patient taking differently: Take 10 mg by mouth daily.) 90 tablet 3   lisinopril-hydrochlorothiazide (ZESTORETIC) 20-12.5 MG tablet Take 2 tablets by mouth daily. 180 tablet 3   prochlorperazine (COMPAZINE)  10 MG tablet Take 1 tablet (10 mg total) by mouth every 6 (six) hours as needed for nausea or vomiting. 30 tablet 1   SPIRIVA RESPIMAT 2.5 MCG/ACT AERS INHALE 2 PUFFS INTO THE LUNGS DAILY 4 g 11   No current facility-administered medications for this visit.    PHYSICAL EXAMINATION: ECOG PERFORMANCE STATUS: 1 - Symptomatic but completely ambulatory  Vitals:   07/24/22 1509  BP: (!) 151/92  Pulse: 85  Resp: 18  Temp: 97.6 F (36.4 C)  SpO2: 98%   Filed Weights   07/24/22 1509  Weight: 146 lb 8 oz (66.5 kg)    GENERAL:alert, no distress and comfortable SKIN: skin color normal, no rashes or significant lesions EYES: normal, Conjunctiva are pink and non-injected, sclera clear  NEURO: alert & oriented x 3 with fluent speech LUNGS:(-) clear to auscultation and percussion with normal breathing effort HEART: (-) regular rate & rhythm and no murmurs and no lower extremity edema ABDOMEN:(-)abdomen soft, (+) tender and normal bowel sounds, Left Lobe of liver is enlarge    LABORATORY DATA:  I have reviewed the data as listed    Latest Ref Rng & Units 07/21/2022    4:45 AM 07/20/2022    4:36 AM 07/18/2022    8:42 PM  CBC  WBC 4.0 - 10.5 K/uL 5.8  6.0  6.2   Hemoglobin 12.0 - 15.0 g/dL 96.0  45.4  09.8   Hematocrit 36.0 - 46.0 % 35.6  35.4  40.1   Platelets 150 - 400 K/uL 185  180   200        Latest Ref Rng & Units 07/21/2022    4:45 AM 07/20/2022    4:36 AM 07/19/2022    2:25 AM  CMP  Glucose 70 - 99 mg/dL 119  147  829   BUN 8 - 23 mg/dL 11  8  9    Creatinine 0.44 - 1.00 mg/dL 5.62  1.30  8.65   Sodium 135 - 145 mmol/L 134  136  133   Potassium 3.5 - 5.1 mmol/L 3.2  3.6  2.9   Chloride 98 - 111 mmol/L 103  107  99   CO2 22 - 32 mmol/L 21  20  17    Calcium 8.9 - 10.3 mg/dL 8.8  8.8  9.1   Total Protein 6.5 - 8.1 g/dL 6.8  6.8  8.0   Total Bilirubin 0.3 - 1.2 mg/dL 0.5  0.8  1.0   Alkaline Phos 38 - 126 U/L 217  214  242   AST 15 - 41 U/L 53  52  53   ALT 0 - 44 U/L 86  82  86      RADIOGRAPHIC STUDIES: I have personally reviewed the radiological images as listed and agreed with the findings in the report. MR LIVER W WO CONTRAST  Result Date: 07/19/2022 CLINICAL DATA:  Evaluate liver lesions. EXAM: MRI ABDOMEN WITHOUT AND WITH CONTRAST TECHNIQUE: Multiplanar multisequence MR imaging of the abdomen was performed both before and after the administration of intravenous contrast. CONTRAST:  7mL GADAVIST GADOBUTROL 1 MMOL/ML IV SOLN COMPARISON:  CT 07/18/2022 FINDINGS: Lower chest: No acute findings. Hepatobiliary: The postcontrast images on today's study are limited by respiratory motion artifact. Within this limitation, there is extensive tumor identified throughout both lobes of liver. These show apparent arterial phase enhancement, and on the delayed images these become isointense to hypointense. This is better demonstrated on the portal venous phase images from 07/18/2022 and the arterial phase  images from the CTA chest, abdomen and pelvis from 07/18/2022. Index lesion within the posterior right hepatic lobe measures 5.3 x 3.6 cm, image 14/5. Within the caudate lobe there is a lesion measuring 4.6 x 2.7 cm, image 13/5. Extensive, thrombus is identified throughout the portal veins which extends into the intrahepatic portal vein into the extrahepatic portal vein.  Assessment for enhancing tumor thrombus is limited due to delayed contrast bolus during the arterial phase images as well as excessive motion artifact. Gallbladder appears normal. No bile duct dilatation. Pancreas: No mass, inflammatory changes, or other parenchymal abnormality identified. Spleen:  Within normal limits in size and appearance. Adrenals/Urinary Tract: Normal right adrenal gland. Enhancing left adrenal nodule is identified measuring 1.7 x 1.5 cm, image 39/21. On the exam from 11/20/2021 this measured 1.5 x 1.3 cm this is difficult to further characterize on the in and out of phase sequence is due to excessive motion artifact. No hydronephrosis. Small cyst identified off the upper pole of the left kidney. No hydronephrosis identified bilaterally. Stomach/Bowel: Stomach appears normal. No dilated loops of large or small bowel. Vascular/Lymphatic: Normal appearance of the abdominal aorta. No enlarged upper abdominal lymph nodes. Other: Mesenteric and bilateral retroperitoneal edema noted. Trace fluid is seen extending along bilateral pericolic gutters. Musculoskeletal: No suspicious bone lesions identified. IMPRESSION: 1. The postcontrast images on today's study are limited by respiratory motion artifact. Within this limitation, there is extensive tumor identified throughout both lobes of liver. These show apparent arterial phase enhancement, and on the delayed images these become isointense to hypointense. This is better demonstrated on the portal venous phase images from 07/18/2022 and the arterial phase images from the CTA chest, abdomen and pelvis from 07/18/2022. In a patient who is at increased risk these findings are strongly suggestive of multifocal hepatocellular carcinoma. Correlation with alpha fetoprotein levels and possible biopsy. 2. Extensive thrombus is identified throughout the portal veins which extends into the intrahepatic portal vein into the extrahepatic portal vein. Assessment for  enhancing tumor thrombus is limited due to delayed contrast bolus during the arterial phase images as well as excessive motion artifact. However, presence of a enhancing tumor thrombus may be seen with hepatocellular carcinoma. 3. Enhancing left adrenal nodule is identified measuring 1.7 x 1.5 cm. On 11/20/2021 this measured 1.5 x 1.3 cm. This is difficult to further characterize on the in and out of phase sequences due to excessive motion artifact. 4. Mesenteric and bilateral retroperitoneal edema. Trace fluid is seen extending along bilateral pericolic gutters. Electronically Signed   By: Signa Kell M.D.   On: 07/19/2022 11:47   CT Angio Chest/Abd/Pel for Dissection W and/or Wo Contrast  Result Date: 07/19/2022 CLINICAL DATA:  Acute aortic syndrome suspected. EXAM: CT ANGIOGRAPHY CHEST, ABDOMEN AND PELVIS TECHNIQUE: Non-contrast CT of the chest was initially obtained. Multidetector CT imaging through the chest, abdomen and pelvis was performed using the standard protocol during bolus administration of intravenous contrast. Multiplanar reconstructed images and MIPs were obtained and reviewed to evaluate the vascular anatomy. RADIATION DOSE REDUCTION: This exam was performed according to the departmental dose-optimization program which includes automated exposure control, adjustment of the mA and/or kV according to patient size and/or use of iterative reconstruction technique. CONTRAST:  OMNIPAQUE IOHEXOL 350 MG/ML SOLN COMPARISON:  11/20/2021, 08/17/2022. FINDINGS: CTA CHEST FINDINGS Cardiovascular: The heart is normal in size and there is a trace pericardial effusion. Scattered coronary artery calcifications are noted. There is atherosclerotic calcification of the aorta without evidence of aneurysm or dissection.  The pulmonary trunk is normal in caliber. Mediastinum/Nodes: No mediastinal, hilar, or axillary lymphadenopathy. A 1 cm hypodensity is noted in the right lobe of the thyroid gland. No  follow-up imaging is recommended. The trachea and esophagus are within normal limits. Lungs/Pleura: Centrilobular emphysematous changes are present in the lungs. Atelectasis is present bilaterally. No effusion or pneumothorax. Musculoskeletal: Degenerative changes are present in the thoracic spine. No acute or suspicious osseous abnormality. Review of the MIP images confirms the above findings. CTA ABDOMEN AND PELVIS FINDINGS VASCULAR Aorta: Normal caliber aorta without aneurysm, dissection, vasculitis or significant stenosis. Aortic atherosclerosis. Celiac: Patent without evidence of aneurysm, dissection, vasculitis or significant stenosis. SMA: Patent without evidence of aneurysm, dissection, vasculitis or significant stenosis. Renals: Both renal arteries are patent without evidence of aneurysm, dissection, vasculitis, fibromuscular dysplasia or significant stenosis. IMA: Patent without evidence of aneurysm, dissection, vasculitis or significant stenosis. Inflow: The common iliac and external iliac and internal iliac arteries are patent. The common femoral and deep femoral arteries are patent bilaterally. There are findings suspicious for high-grade stenosis/occlusion of the proximal superficial femoral arteries bilaterally. Veins: Thrombus is noted in the portal vein with extension into the hepatic veins information multiple collateral vessels at the porta hepatis and gallbladder fossa suggesting collateralization. There are findings suspicious of thrombus in the distal superior mesenteric vein in the right lower quadrant. The splenic vein appears patent. Review of the MIP images confirms the above findings. NON-VASCULAR Hepatobiliary: Multiple hypodense and hypervascular regions are noted in the liver. No biliary ductal dilatation or stones in the gallbladder. Pancreas: Unremarkable. No pancreatic ductal dilatation or surrounding inflammatory changes. Spleen: Normal in size without focal abnormality.  Adrenals/Urinary Tract: There is nodular thickening of the left adrenal gland measuring 1.3 cm. The right adrenal gland is within normal limits. The kidneys enhance symmetrically. Subcentimeter hypodensities are present in the kidneys bilaterally, likely cysts. No renal calculus or hydronephrosis. The bladder is unremarkable. Stomach/Bowel: There is a small hiatal hernia. There is questionable mild thickening of the gastric antrum. Appendix appears normal. No obstruction, free air, or pneumatosis. Scattered areas of colonic wall thickening are noted and most pronounced at the splenic flexure and hepatic flexure with surrounding fat stranding. Lymphatic: No abdominal or pelvic lymphadenopathy by size criteria. Reproductive: Status post hysterectomy. No adnexal masses. Other: Small amount of free fluid is noted in the pelvis. Musculoskeletal: Degenerative changes in the lumbar spine. No acute or suspicious osseous abnormality. Review of the MIP images confirms the above findings. IMPRESSION: 1. No evidence of aortic aneurysm or dissection. 2. Findings suggestive of high-grade stenosis/occlusion of the proximal superficial femoral arteries bilaterally. 3. Multiple hypodense and hypervascular masses in the liver, concerning for metastatic disease. MRI with contrast is recommended for further evaluation. 4. Portal venous thrombus extending into the hepatic veins in the liver with cavernous transformation. There is questionable thrombus involving the distal branch of the superior mesenteric vein in the right lower quadrant. 5. Multifocal colonic wall thickening with surrounding fat stranding, most pronounced at the hepatic and splenic flexures suggesting colitis. The possibility of underlying neoplasm can not be excluded. Colonoscopy is recommended for further evaluation on follow-up. 6. Indeterminate left adrenal nodule. Adrenal follow-up and workup should be based on liver findings. 7. Small hiatal hernia. There is  questionable mild thickening of the walls of the gastric antrum. 8. Emphysema. 9. Remaining incidental findings as described above. Findings were discussed with Dr. Renaye Rakers at 11:45 p.m. Electronically Signed   By: Charlestine Night.D.  On: 07/19/2022 00:06   CT Head Wo Contrast  Result Date: 07/18/2022 CLINICAL DATA:  Headache, new onset (Age >= 51y) EXAM: CT HEAD WITHOUT CONTRAST TECHNIQUE: Contiguous axial images were obtained from the base of the skull through the vertex without intravenous contrast. RADIATION DOSE REDUCTION: This exam was performed according to the departmental dose-optimization program which includes automated exposure control, adjustment of the mA and/or kV according to patient size and/or use of iterative reconstruction technique. COMPARISON:  None Available. FINDINGS: Brain: Normal anatomic configuration. No abnormal intra or extra-axial mass lesion or fluid collection. No abnormal mass effect or midline shift. No evidence of acute intracranial hemorrhage or infarct. Ventricular size is normal. Cerebellum unremarkable. Vascular: Unremarkable Skull: Intact Sinuses/Orbits: Paranasal sinuses are clear. Orbits are unremarkable. Other: Mastoid air cells and middle ear cavities are clear. IMPRESSION: 1. No acute intracranial abnormality. Electronically Signed   By: Helyn Numbers M.D.   On: 07/18/2022 23:35   CT ABDOMEN PELVIS W CONTRAST  Result Date: 07/18/2022 CLINICAL DATA:  Lower abdominal tenderness. EXAM: CT ABDOMEN AND PELVIS WITH CONTRAST TECHNIQUE: Multidetector CT imaging of the abdomen and pelvis was performed using the standard protocol following bolus administration of intravenous contrast. RADIATION DOSE REDUCTION: This exam was performed according to the departmental dose-optimization program which includes automated exposure control, adjustment of the mA and/or kV according to patient size and/or use of iterative reconstruction technique. CONTRAST:  75mL OMNIPAQUE IOHEXOL 350  MG/ML SOLN COMPARISON:  Abdominal ultrasound 03/26/2016. No prior CT available. FINDINGS: Lower chest: Hypoventilatory changes. No basilar airspace disease. Hepatobiliary: The liver is diffusely heterogeneous with multiple low-density liver lesions. Largest lesion is in the posterior right hepatic lobe measuring 4.4 cm, series 3, image 25. There is thrombus within the portal vein at the porta hepatis extending into the liver. Cavernous transformation of the portal vein is seen. Gallbladder physiologically distended, no calcified stone. No biliary dilatation. Pancreas: There is mild heterogeneity in the pancreatic head without definite lesion. No ductal dilatation or inflammation. Spleen: No splenomegaly. There is a cleft in the spleen. Lobulated splenic contours. Adrenals/Urinary Tract: 14 mm left adrenal nodule. Normal right adrenal gland. No hydronephrosis. Tiny hypodensities in both kidneys are too small to characterize but likely small cysts. No evidence of solid renal lesion. Unremarkable appearance of bladder. Stomach/Bowel: Small hiatal hernia. Equivocal distal gastric wall thickening. No small bowel inflammation. The appendix is normal. Scattered areas of colonic wall thickening throughout multiple colonic segments. No evidence colonic mass. Vascular/Lymphatic: Aortic atherosclerosis without aneurysm. Nonocclusive thrombus within the main portal vein extending into the intrahepatic portal veins. No propagation into the superior mesenteric or splenic veins. Scattered retroperitoneal and periportal lymph nodes. Reproductive: Uterus and bilateral adnexa are unremarkable. Other: Small volume abdominopelvic ascites. No free air. Musculoskeletal: Multilevel degenerative disc disease and facet hypertrophy. No suspicious bone lesion or acute osseous findings. IMPRESSION: 1. Heterogeneous liver with multiple low-density liver lesions, largest measuring 4.4 cm. Findings are suspicious for metastatic disease.  Possibility of primary hepatic malignancy is raised in the setting of chronic hepatitis C. Recommend GI consultation and consideration for hepatic protocol MRI. 2. Nonocclusive thrombus within the main portal vein extending into the intrahepatic portal veins. Cavernous transformation of the portal vein. 3. Scattered areas of colonic wall thickening throughout multiple colonic segments, nondistesion versus colitis. 4. Small volume abdominopelvic ascites. 5. Indeterminate 14 mm left adrenal nodule. Adrenal follow-up and workup should be based on liver findings. 6. Small hiatal hernia. Equivocal distal gastric wall thickening. Aortic Atherosclerosis (ICD10-I70.0). These  results were called by telephone at the time of interpretation on 07/18/2022 at 10:17 pm to provider KEN LE , who verbally acknowledged these results. Electronically Signed   By: Narda Rutherford M.D.   On: 07/18/2022 22:17     Orders Placed This Encounter  Procedures   CBC with Differential (Cancer Center Only)    Standing Status:   Future    Standing Expiration Date:   08/15/2023   CMP (Cancer Center only)    Standing Status:   Future    Standing Expiration Date:   08/15/2023   T4    Standing Status:   Future    Standing Expiration Date:   08/15/2023   TSH    Standing Status:   Future    Standing Expiration Date:   08/15/2023   CBC with Differential (Cancer Center Only)    Standing Status:   Future    Standing Expiration Date:   09/12/2023   CMP (Cancer Center only)    Standing Status:   Future    Standing Expiration Date:   09/12/2023   CBC with Differential (Cancer Center Only)    Standing Status:   Future    Standing Expiration Date:   10/10/2023   CMP (Cancer Center only)    Standing Status:   Future    Standing Expiration Date:   10/10/2023   T4    Standing Status:   Future    Standing Expiration Date:   10/10/2023   TSH    Standing Status:   Future    Standing Expiration Date:   10/10/2023    All questions were answered. The  patient knows to call the clinic with any problems, questions or concerns. The total time spent in the appointment was 60 minutes.     Malachy Mood, MD 07/24/2022   Carolin Coy am acting as scribe for Malachy Mood, MD.   I have reviewed the above documentation for accuracy and completeness, and I agree with the above.

## 2022-07-25 ENCOUNTER — Telehealth: Payer: Self-pay | Admitting: Hematology

## 2022-07-25 ENCOUNTER — Other Ambulatory Visit: Payer: Self-pay

## 2022-07-25 NOTE — Telephone Encounter (Signed)
Patient is aware of upcoming appointment times/dates.  

## 2022-07-26 ENCOUNTER — Encounter (HOSPITAL_BASED_OUTPATIENT_CLINIC_OR_DEPARTMENT_OTHER): Payer: Self-pay

## 2022-07-26 NOTE — Progress Notes (Signed)
Han, Lynann Bologna, PA-C  Oley Balm, MD Cc: Leodis Rains D Dr. Deanne Coffer,  It was ordered by Dr. Jerral Ralph and I put in a note and ever since then it has my name as ordering provider. Lesson learned, will never do it again. But she will be seen by Dr. [name] on 6/13, bx result can go to her. And yes we should wait for AFP result per Dr. Lowella Dandy and Dr. Loreta Ave as her bx will be challenging, I hope it will be ready by the end of this week. I asked Dr. [name] to let us know if she does NOT want [place] to proceed with bx, I will be back to [facility] on Thu/Fri so I will follow up.  Thank you, Aimee

## 2022-07-28 ENCOUNTER — Ambulatory Visit (INDEPENDENT_AMBULATORY_CARE_PROVIDER_SITE_OTHER): Payer: Medicare HMO | Admitting: Family Medicine

## 2022-07-28 ENCOUNTER — Telehealth: Payer: Self-pay | Admitting: Family Medicine

## 2022-07-28 ENCOUNTER — Encounter: Payer: Self-pay | Admitting: Family Medicine

## 2022-07-28 ENCOUNTER — Encounter: Payer: Self-pay | Admitting: Hematology

## 2022-07-28 VITALS — BP 148/90 | HR 102 | Temp 98.2°F | Ht 64.0 in | Wt 144.8 lb

## 2022-07-28 DIAGNOSIS — I10 Essential (primary) hypertension: Secondary | ICD-10-CM

## 2022-07-28 DIAGNOSIS — C220 Liver cell carcinoma: Secondary | ICD-10-CM

## 2022-07-28 DIAGNOSIS — E876 Hypokalemia: Secondary | ICD-10-CM | POA: Diagnosis not present

## 2022-07-28 DIAGNOSIS — B182 Chronic viral hepatitis C: Secondary | ICD-10-CM

## 2022-07-28 DIAGNOSIS — I81 Portal vein thrombosis: Secondary | ICD-10-CM | POA: Diagnosis not present

## 2022-07-28 MED ORDER — AMLODIPINE BESYLATE 10 MG PO TABS: 10.0000 mg | ORAL_TABLET | Freq: Every day | ORAL | 3 refills | Status: DC

## 2022-07-28 NOTE — Telephone Encounter (Signed)
Pt forgot to let Dr Mardelle Matte know that she would like a Handicap Placard. I let pt know we will call her back when the paperwork is done. Please advise.

## 2022-07-28 NOTE — Progress Notes (Signed)
Subjective  CC:  Chief Complaint  Patient presents with   Hospitalization Follow-up    07/18/2022 - 07/21/2022 (3 days) [facility]    Lower Abdominal pain  Pt stated that she still feels weak and drained all the time     HPI: Christine Hurley is a 72 y.o. female who presents to the office today to address the problems listed above in the chief complaint. Admitted for abdominal pain as above: CT scan shows liver masses. I reviewed chart, discharge summary, labs and imaging studies. Also showed portal vein thrombosis. H/o hep C.  I reviewed f/u with oncology: clinical HCC, no need for biopsy, tumor markers positive: have cancer treatment palliative care plan.  Pt reports lack of appetite and loose stools but little pain now. On eloquis. No f/c Labs showed hypokalemia.  HTN on meds but readings in hospital and here remain high. She reports good compliance.  Has positive and accepting outlook. No questions.    Assessment  1. Hepatocellular carcinoma (HCC)   2. Portal vein thrombosis   3. Hypokalemia   4. Essential hypertension   5. Chronic hepatitis C without hepatic coma (HCC)      Plan  HCC and portal vein thrombosis:  palliative chemo and immunotherapy per oncology. Treatment to start after she returns from vacation. Discussed mgt of loose stools.  Recheck renal function and electrolytes.  HTN: want better control. Will titrate up amlodipine dose to 10mg  daily. Continue high dose ace/hydrochlorothiazide combo.  Supportive counseling;palliative care.  Follow up: q 3 months 07/28/2022  Orders Placed This Encounter  Procedures   CBC with Differential/Platelet   Comprehensive metabolic panel   Magnesium   No orders of the defined types were placed in this encounter.     I reviewed the patients updated PMH, FH, and SocHx.    Patient Active Problem List   Diagnosis Date Noted   Hepatocellular carcinoma (HCC) 07/24/2022    Priority: High   PAD  (peripheral artery disease) (HCC) 10/01/2021    Priority: High   Intermittent claudication (HCC) 09/19/2021    Priority: High   Mixed hyperlipidemia 01/17/2019    Priority: High   Cigarette nicotine dependence with nicotine-induced disorder 11/03/2016    Priority: High   Essential hypertension 05/26/2013    Priority: High   Chronic hepatitis C without hepatic coma (HCC) 11/04/2006    Priority: High   Marijuana use, episodic 06/18/2017    Priority: Medium    Osteopenia of neck of femur 05/22/2016    Priority: Medium    Abnormal thallium stress test 03/07/2015    Priority: Medium    Chronic obstructive pulmonary disease (HCC) 07/01/2013    Priority: Medium    Colitis 07/19/2022   Liver lesion 07/19/2022   Portal vein thrombosis 07/19/2022   Hypertensive urgency 11/22/2015   Current Meds  Medication Sig   albuterol (VENTOLIN HFA) 108 (90 Base) MCG/ACT inhaler INHALE 1 TO 2 PUFFS INTO THE LUNGS EVERY 6 HOURS AS NEEDED FOR WHEEZING OR SHORTNESS OF BREATH (Patient taking differently: Inhale 2 puffs into the lungs every 6 (six) hours as needed for wheezing or shortness of breath.)   amLODipine (NORVASC) 5 MG tablet Take 1 tablet (5 mg total) by mouth daily.   apixaban (ELIQUIS) 5 MG TABS tablet Take 1 tablet (5mg ) by mouth twice daily   APIXABAN (ELIQUIS) VTE STARTER PACK (10MG  AND 5MG ) Take 2 tablets (10 mg) twice daily x 1 week, after 1 week switch to 1 tablet (  5 mg) twice daily   atorvastatin (LIPITOR) 10 MG tablet TAKE 1 TABLET(10 MG) BY MOUTH AT BEDTIME (Patient taking differently: Take 10 mg by mouth daily.)   lisinopril-hydrochlorothiazide (ZESTORETIC) 20-12.5 MG tablet Take 2 tablets by mouth daily.   prochlorperazine (COMPAZINE) 10 MG tablet Take 1 tablet (10 mg total) by mouth every 6 (six) hours as needed for nausea or vomiting.   SPIRIVA RESPIMAT 2.5 MCG/ACT AERS INHALE 2 PUFFS INTO THE LUNGS DAILY    Allergies: Patient has No Known Allergies. Family History: Patient  family history includes Asthma in her mother; Cancer in her maternal grandmother and mother; Cancer - Prostate in her father. Social History:  Patient  reports that she has been smoking cigarettes. She has a 11.25 pack-year smoking history. She has never used smokeless tobacco. She reports current alcohol use of about 2.0 standard drinks of alcohol per week. She reports that she does not currently use drugs after having used the following drugs: Marijuana.  Review of Systems: Constitutional: Negative for fever malaise or anorexia Cardiovascular: negative for chest pain Respiratory: negative for SOB or persistent cough Gastrointestinal: negative for abdominal pain  Objective  Vitals: BP (!) 148/90   Pulse (!) 102   Temp 98.2 F (36.8 C)   Ht 5\' 4"  (1.626 m)   Wt 144 lb 12.8 oz (65.7 kg)   SpO2 98%   BMI 24.85 kg/m  General: no acute distress , A&Ox3 HEENT: PEERL, conjunctiva normal, neck is supple Cardiovascular:  RRR without murmur or gallop.  Respiratory:  Good breath sounds bilaterally, CTAB with normal respiratory effort Skin:  Warm, no rashes  Commons side effects, risks, benefits, and alternatives for medications and treatment plan prescribed today were discussed, and the patient expressed understanding of the given instructions. Patient is instructed to call or message via MyChart if he/she has any questions or concerns regarding our treatment plan. No barriers to understanding were identified. We discussed Red Flag symptoms and signs in detail. Patient expressed understanding regarding what to do in case of urgent or emergency type symptoms.  Medication list was reconciled, printed and provided to the patient in AVS. Patient instructions and summary information was reviewed with the patient as documented in the AVS. This note was prepared with assistance of Dragon voice recognition software. Occasional wrong-word or sound-a-like substitutions may have occurred due to the inherent  limitations of voice recognition software

## 2022-07-29 ENCOUNTER — Encounter: Payer: Self-pay | Admitting: Hematology

## 2022-07-29 LAB — COMPREHENSIVE METABOLIC PANEL
ALT: 72 U/L — ABNORMAL HIGH (ref 0–35)
AST: 59 U/L — ABNORMAL HIGH (ref 0–37)
Albumin: 4.3 g/dL (ref 3.5–5.2)
Alkaline Phosphatase: 284 U/L — ABNORMAL HIGH (ref 39–117)
BUN: 12 mg/dL (ref 6–23)
CO2: 24 mEq/L (ref 19–32)
Calcium: 9.5 mg/dL (ref 8.4–10.5)
Chloride: 100 mEq/L (ref 96–112)
Creatinine, Ser: 0.74 mg/dL (ref 0.40–1.20)
GFR: 80.89 mL/min (ref >60.00)
Glucose, Bld: 99 mg/dL (ref 70–99)
Potassium: 4 mEq/L (ref 3.5–5.1)
Sodium: 134 mEq/L — ABNORMAL LOW (ref 135–145)
Total Bilirubin: 0.6 mg/dL (ref 0.2–1.2)
Total Protein: 8.1 g/dL (ref 6.0–8.3)

## 2022-07-29 LAB — CBC WITH DIFFERENTIAL/PLATELET
Basophils Absolute: 0 10*3/uL (ref 0.0–0.1)
Basophils Relative: 0.8 % (ref 0.0–3.0)
Eosinophils Absolute: 0.1 10*3/uL (ref 0.0–0.7)
Eosinophils Relative: 1.1 % (ref 0.0–5.0)
HCT: 39.9 % (ref 36.0–46.0)
Hemoglobin: 13 g/dL (ref 12.0–15.0)
Lymphocytes Relative: 14.1 % (ref 12.0–46.0)
Lymphs Abs: 0.9 10*3/uL (ref 0.7–4.0)
MCHC: 32.6 g/dL (ref 30.0–36.0)
MCV: 90.5 fl (ref 78.0–100.0)
Monocytes Absolute: 0.4 10*3/uL (ref 0.1–1.0)
Monocytes Relative: 6 % (ref 3.0–12.0)
Neutro Abs: 4.7 10*3/uL (ref 1.4–7.7)
Neutrophils Relative %: 78 % — ABNORMAL HIGH (ref 43.0–77.0)
Platelets: 294 10*3/uL (ref 150.0–400.0)
RBC: 4.4 Mil/uL (ref 3.87–5.11)
RDW: 15.1 % (ref 11.5–15.5)
WBC: 6 10*3/uL (ref 4.0–10.5)

## 2022-07-29 LAB — MAGNESIUM: Magnesium: 2.1 mg/dL (ref 1.5–2.5)

## 2022-07-30 ENCOUNTER — Telehealth: Payer: Self-pay | Admitting: Family Medicine

## 2022-07-30 ENCOUNTER — Other Ambulatory Visit: Payer: Self-pay

## 2022-07-30 NOTE — Telephone Encounter (Signed)
Pt called and wanted to know if we can RX a cannabis card for her appetite or do you know where she can be referred to.

## 2022-07-30 NOTE — Progress Notes (Signed)
The proposed treatment discussed in conference is for discussion purpose only and is not a binding recommendation.  The patients have not been physically examined, or presented with their treatment options.  Therefore, final treatment plans cannot be decided.  

## 2022-07-30 NOTE — Telephone Encounter (Signed)
Spoke with pt to let her know that she would have to speak with her oncologist about Rx cannabis

## 2022-07-31 ENCOUNTER — Other Ambulatory Visit: Payer: Self-pay

## 2022-08-04 NOTE — Telephone Encounter (Signed)
Patient requests to be called for status of Handicap placard

## 2022-08-04 NOTE — Telephone Encounter (Signed)
Placard will be placed up front for pt to pick up and pt has been notified.

## 2022-08-05 NOTE — Progress Notes (Unsigned)
Christine Hurley left me a vm requesting a prescription for "cannibis card".  I returned her call and let her know we do not provide prescriptions for cannibis cards.  I told her to speak with Dr [name] at her appt.

## 2022-08-06 ENCOUNTER — Encounter: Payer: Self-pay | Admitting: Hematology

## 2022-08-07 ENCOUNTER — Encounter: Payer: Self-pay | Admitting: Hematology

## 2022-08-07 NOTE — Progress Notes (Signed)
Pharmacist Chemotherapy Monitoring - Initial Assessment    Anticipated start date: 08/15/22   The following has been reviewed per standard work regarding the patient's treatment regimen: The patient's diagnosis, treatment plan and drug doses, and organ/hematologic function Lab orders and baseline tests specific to treatment regimen  The treatment plan start date, drug sequencing, and pre-medications Prior authorization status  Patient's documented medication list, including drug-drug interaction screen and prescriptions for anti-emetics and supportive care specific to the treatment regimen The drug concentrations, fluid compatibility, administration routes, and timing of the medications to be used The patient's access for treatment and lifetime cumulative dose history, if applicable  The patient's medication allergies and previous infusion related reactions, if applicable   Changes made to treatment plan:  N/A  Follow up needed:  N/A   Ebony Hail, Pharm.D., CPP 08/07/2022@3 :25 PM

## 2022-08-07 NOTE — Progress Notes (Signed)
Called patient after referral from social worker regarding Licensed conveyancer.  Introduced myself as Dance movement psychotherapist and to offer available resources. Discussed one-time $1000 Marketing executive to assist with personal expenses while going through treatment. Advised what is needed to apply and if she could bring at next appointment on 08/13/22 and provide to registration staff to scan and email to me. They will then give her grant paperwork to complete and green folder with expense sheet and my card. Went over in detail expenses and how they are covered. She will receive detailed paperwork with this information as well.  Also, advised if she would like to update ROI on that day, she will be able to do so at registration. She was very appreciative and verbalized understanding.  Provided my contact name and direct number for any additional financial questions or concerns.

## 2022-08-11 ENCOUNTER — Encounter: Payer: Self-pay | Admitting: Hematology

## 2022-08-11 NOTE — Progress Notes (Signed)
Received income documents for grant.   Grant approval pending grant paperwork signature at next appointment 08/13/22.

## 2022-08-13 ENCOUNTER — Inpatient Hospital Stay: Payer: Medicare HMO | Attending: Hematology

## 2022-08-13 ENCOUNTER — Other Ambulatory Visit: Payer: Self-pay

## 2022-08-13 DIAGNOSIS — C220 Liver cell carcinoma: Secondary | ICD-10-CM | POA: Insufficient documentation

## 2022-08-13 DIAGNOSIS — Z79899 Other long term (current) drug therapy: Secondary | ICD-10-CM | POA: Insufficient documentation

## 2022-08-13 DIAGNOSIS — Z5112 Encounter for antineoplastic immunotherapy: Secondary | ICD-10-CM | POA: Insufficient documentation

## 2022-08-14 NOTE — Assessment & Plan Note (Addendum)
[id number], stage IIIA, BCLC advanced stage (C) -Patient was hospitalized for colitis on 07/18/2022, CT scan showed multiple liver masses -Liver MRI on July 19, 2022 showed extensive tumor identified throughout both lobes of the liver, radiographic characteristic are not consistent with hepatocellular carcinoma, her AFP was 59, with her history of hepatitis C, this is diagnostic for multifocal HCC or HCC with intrahepatic metastasis -I do not think she needs liver biopsy. -I will review her case in our GI tumor board next week. -I discussed the incurable nature of her liver cancer, due to the diffuse involvement of both lobes.  She is not a candidate for liver resection or liver transplant. -I recommend first line therapy with Durvalumab and Imjudo, plan to start today 7/5 -will refer her to IR for liver targeted therapy

## 2022-08-15 ENCOUNTER — Inpatient Hospital Stay (HOSPITAL_BASED_OUTPATIENT_CLINIC_OR_DEPARTMENT_OTHER): Payer: Medicare HMO | Admitting: Hematology

## 2022-08-15 ENCOUNTER — Inpatient Hospital Stay: Payer: Medicare HMO

## 2022-08-15 ENCOUNTER — Other Ambulatory Visit: Payer: Self-pay

## 2022-08-15 ENCOUNTER — Inpatient Hospital Stay: Payer: Medicare HMO | Admitting: Licensed Clinical Social Worker

## 2022-08-15 ENCOUNTER — Telehealth: Payer: Self-pay | Admitting: Hematology

## 2022-08-15 ENCOUNTER — Encounter: Payer: Self-pay | Admitting: Hematology

## 2022-08-15 VITALS — BP 156/90 | HR 80 | Temp 98.4°F | Resp 18 | Ht 64.0 in | Wt 143.1 lb

## 2022-08-15 VITALS — BP 137/78 | HR 82 | Resp 18

## 2022-08-15 DIAGNOSIS — C220 Liver cell carcinoma: Secondary | ICD-10-CM

## 2022-08-15 DIAGNOSIS — Z5112 Encounter for antineoplastic immunotherapy: Secondary | ICD-10-CM | POA: Diagnosis present

## 2022-08-15 DIAGNOSIS — Z79899 Other long term (current) drug therapy: Secondary | ICD-10-CM | POA: Diagnosis not present

## 2022-08-15 LAB — CBC WITH DIFFERENTIAL (CANCER CENTER ONLY)
Abs Immature Granulocytes: 0.01 10*3/uL (ref 0.00–0.07)
Basophils Absolute: 0 10*3/uL (ref 0.0–0.1)
Basophils Relative: 1 %
Eosinophils Absolute: 0.1 10*3/uL (ref 0.0–0.5)
Eosinophils Relative: 1 %
HCT: 36.4 % (ref 36.0–46.0)
Hemoglobin: 12.3 g/dL (ref 12.0–15.0)
Immature Granulocytes: 0 %
Lymphocytes Relative: 12 %
Lymphs Abs: 0.7 10*3/uL (ref 0.7–4.0)
MCH: 29.9 pg (ref 26.0–34.0)
MCHC: 33.8 g/dL (ref 30.0–36.0)
MCV: 88.3 fL (ref 80.0–100.0)
Monocytes Absolute: 0.3 10*3/uL (ref 0.1–1.0)
Monocytes Relative: 5 %
Neutro Abs: 4.7 10*3/uL (ref 1.7–7.7)
Neutrophils Relative %: 81 %
Platelet Count: 217 10*3/uL (ref 150–400)
RBC: 4.12 MIL/uL (ref 3.87–5.11)
RDW: 14.6 % (ref 11.5–15.5)
WBC Count: 5.7 10*3/uL (ref 4.0–10.5)
nRBC: 0 % (ref 0.0–0.2)

## 2022-08-15 LAB — CMP (CANCER CENTER ONLY)
ALT: 86 U/L — ABNORMAL HIGH (ref 0–44)
AST: 67 U/L — ABNORMAL HIGH (ref 15–41)
Albumin: 4.1 g/dL (ref 3.5–5.0)
Alkaline Phosphatase: 340 U/L — ABNORMAL HIGH (ref 38–126)
Anion gap: 11 (ref 5–15)
BUN: 16 mg/dL (ref 8–23)
CO2: 22 mmol/L (ref 22–32)
Calcium: 10 mg/dL (ref 8.9–10.3)
Chloride: 105 mmol/L (ref 98–111)
Creatinine: 0.79 mg/dL (ref 0.44–1.00)
GFR, Estimated: >60 mL/min (ref >60)
Glucose, Bld: 113 mg/dL — ABNORMAL HIGH (ref 70–99)
Potassium: 3.3 mmol/L — ABNORMAL LOW (ref 3.5–5.1)
Sodium: 138 mmol/L (ref 135–145)
Total Bilirubin: 0.5 mg/dL (ref 0.3–1.2)
Total Protein: 7.6 g/dL (ref 6.5–8.1)

## 2022-08-15 LAB — TSH: TSH: 1.255 u[IU]/mL (ref 0.350–4.500)

## 2022-08-15 MED ORDER — SODIUM CHLORIDE 0.9 % IV SOLN: Freq: Once | INTRAVENOUS | Status: AC

## 2022-08-15 MED ORDER — SODIUM CHLORIDE 0.9% FLUSH: 10.0000 mL | INTRAVENOUS | Status: DC | PRN

## 2022-08-15 MED ORDER — DRONABINOL 2.5 MG PO CAPS: 2.5000 mg | ORAL_CAPSULE | Freq: Two times a day (BID) | ORAL | 0 refills | Status: DC

## 2022-08-15 MED ORDER — SODIUM CHLORIDE 0.9 % IV SOLN
1500.0000 mg | Freq: Once | INTRAVENOUS | Status: AC
  Administered 2022-08-15: 1500 mg via INTRAVENOUS
  Filled 2022-08-15: qty 30

## 2022-08-15 MED ORDER — HEPARIN SOD (PORK) LOCK FLUSH 100 UNIT/ML IV SOLN: 500.0000 [IU] | Freq: Once | INTRAVENOUS | Status: DC | PRN

## 2022-08-15 MED ORDER — TREMELIMUMAB-ACTL CHEMO INJECTION 25 MG/1.25ML
300.0000 mg | Freq: Once | INTRAVENOUS | Status: AC
  Administered 2022-08-15: 300 mg via INTRAVENOUS
  Filled 2022-08-15: qty 15

## 2022-08-15 NOTE — Telephone Encounter (Signed)
Patient is aware of upcoming appointment times/dates regarding nutrition appointment

## 2022-08-15 NOTE — Patient Instructions (Signed)
[facility]  Discharge Instructions: Thank you for choosing [facility] to provide your oncology and hematology care.   If you have a lab appointment with the Cancer Center, please go directly to the Cancer Center and check in at the registration area.   Wear comfortable clothing and clothing appropriate for easy access to any Portacath or PICC line.   We strive to give you quality time with your provider. You may need to reschedule your appointment if you arrive late (15 or more minutes).  Arriving late affects you and other patients whose appointments are after yours.  Also, if you miss three or more appointments without notifying the office, you may be dismissed from the clinic at the provider's discretion.      For prescription refill requests, have your pharmacy contact our office and allow 72 hours for refills to be completed.    Today you received the following chemotherapy and/or immunotherapy agents: Imjudo, Imfinzi.      To help prevent nausea and vomiting after your treatment, we encourage you to take your nausea medication as directed.  BELOW ARE SYMPTOMS THAT SHOULD BE REPORTED IMMEDIATELY: *FEVER GREATER THAN 100.4 F (38 C) OR HIGHER *CHILLS OR SWEATING *NAUSEA AND VOMITING THAT IS NOT CONTROLLED WITH YOUR NAUSEA MEDICATION *UNUSUAL SHORTNESS OF BREATH *UNUSUAL BRUISING OR BLEEDING *URINARY PROBLEMS (pain or burning when urinating, or frequent urination) *BOWEL PROBLEMS (unusual diarrhea, constipation, pain near the anus) TENDERNESS IN MOUTH AND THROAT WITH OR WITHOUT PRESENCE OF ULCERS (sore throat, sores in mouth, or a toothache) UNUSUAL RASH, SWELLING OR PAIN  UNUSUAL VAGINAL DISCHARGE OR ITCHING   Items with * indicate a potential emergency and should be followed up as soon as possible or go to the Emergency Department if any problems should occur.  Please show the CHEMOTHERAPY ALERT CARD or IMMUNOTHERAPY ALERT CARD  at check-in to the Emergency Department and triage nurse.  Should you have questions after your visit or need to cancel or reschedule your appointment, please contact [facility]  Dept: [phone]  and follow the prompts.  Office hours are 8:00 a.m. to 4:30 p.m. Monday - Friday. Please note that voicemails left after 4:00 p.m. may not be returned until the following business day.  We are closed weekends and major holidays. You have access to a nurse at all times for urgent questions. Please call the main number to the clinic Dept: [phone] and follow the prompts.   For any non-urgent questions, you may also contact your provider using MyChart. We now offer e-Visits for anyone 62 and older to request care online for non-urgent symptoms. For details visit [email or web address].   Also download the MyChart app! Go to the app store, search "MyChart", open the app, select [facility], and log in with your MyChart username and password.  Tremelimumab Injection What is this medication? TREMELIMUMAB (tre mel IM ue mab) treats liver cancer and lung cancer. It works by helping your immune system slow or stop the spread of cancer cells. It is a monoclonal antibody. This medicine may be used for other purposes; ask your health care provider or pharmacist if you have questions. COMMON BRAND NAME(S): IMJUDO What should I tell my care team before I take this medication? They need to know if you have any of these conditions: Autoimmune conditions, such as Crohn disease, ulcerative colitis, lupus Nervous system conditions, such as Guillain-Barre syndrome or myasthenia gravis An unusual  or allergic reaction to tremelimumab, other medications, foods, dyes, or preservatives Pregnant or trying to get pregnant Breastfeeding How should I use this medication? This medication is infused into a vein. It is given by your care team in a hospital or clinic setting. A special  MedGuide will be given to you before each treatment. Be sure to read this information carefully each time. Talk to your care team about the use of this medication in children. Special care may be needed. Overdosage: If you think you have taken too much of this medicine contact a poison control center or emergency room at once. NOTE: This medicine is only for you. Do not share this medicine with others. What if I miss a dose? Keep appointments for follow-up doses. It is important not to miss your dose. Call your care team if you are unable to keep an appointment. What may interact with this medication? Interactions have not been studied. This list may not describe all possible interactions. Give your health care provider a list of all the medicines, herbs, non-prescription drugs, or dietary supplements you use. Also tell them if you smoke, drink alcohol, or use illegal drugs. Some items may interact with your medicine. What should I watch for while using this medication? Your condition will be monitored carefully while you are receiving this medication. You may need blood work done while you are taking this medication. This medication may cause serious skin reactions. They can happen weeks to months after starting the medication. Contact your care team right away if you notice fevers or flu-like symptoms with a rash. The rash may be red or purple and then turn into blisters or peeling of the skin. You may also notice a red rash with swelling of the face, lips, or lymph nodes in your neck or under your arms. Tell your care team right away if you have any change in your eyesight. Talk to your care team if you may be pregnant. Serious birth defects can occur if you take this medication during pregnancy and for 3 months after the last dose. You will need a negative pregnancy test before starting this medication. Contraception is recommended while taking this medication and for 3 months after the last dose.  Your care team can help you find the option that works for you. Do not breastfeed while taking this medication and for 3 months after the last dose. What side effects may I notice from receiving this medication? Side effects that you should report to your care team as soon as possible: Allergic reactions--skin rash, itching, hives, swelling of the face, lips, tongue, or throat Dry cough, shortness of breath or trouble breathing Eye pain, redness, irritation, or discharge with blurry or decreased vision Heart muscle inflammation--unusual weakness or fatigue, shortness of breath, chest pain, fast or irregular heartbeat, dizziness, swelling of the ankles, feet, or hands Hormone gland problems--headache, sensitivity to light, unusual weakness or fatigue, dizziness, fast or irregular heartbeat, increased sensitivity to cold or heat, excessive sweating, constipation, hair loss, increased thirst or amount of urine, tremors or shaking, irritability Infusion reactions--chest pain, shortness of breath or trouble breathing, feeling faint or lightheaded Kidney injury (glomerulonephritis)--decrease in the amount of urine, red or dark brown urine, foamy or bubbly urine, swelling of the ankles, hands, or feet Liver injury--right upper belly pain, loss of appetite, nausea, light-colored stool, dark yellow or brown urine, yellowing skin or eyes, unusual weakness or fatigue Pain, tingling, or numbness in the hands or feet, muscle weakness, change  in vision, confusion or trouble speaking, loss of balance or coordination, trouble walking, seizures Pancreatitis--severe stomach pain that spreads to your back or gets worse after eating or when touched, fever, nausea, vomiting Rash, fever, and swollen lymph nodes Redness, blistering, peeling, or loosening of the skin, including inside the mouth Stomach pain that is severe, does not go away, or gets worse Sudden or severe stomach pain, bloody diarrhea, fever, nausea,  vomiting Side effects that usually do not require medical attention (report these to your care team if they continue or are bothersome): Bone, joint, or muscle pain Diarrhea Fatigue Loss of appetite Nausea Skin rash This list may not describe all possible side effects. Call your doctor for medical advice about side effects. You may report side effects to FDA at 1-800-FDA-1088. Where should I keep my medication? This medication is given in a hospital or clinic. It will not be stored at home. NOTE: This sheet is a summary. It may not cover all possible information. If you have questions about this medicine, talk to your doctor, pharmacist, or health care provider.  2024 Elsevier/Gold Standard (2022-03-17 00:00:00)  Durvalumab Injection What is this medication? DURVALUMAB (dur VAL ue mab) treats some types of cancer. It works by helping your immune system slow or stop the spread of cancer cells. It is a monoclonal antibody. This medicine may be used for other purposes; ask your health care provider or pharmacist if you have questions. COMMON BRAND NAME(S): IMFINZI What should I tell my care team before I take this medication? They need to know if you have any of these conditions: Allogeneic stem cell transplant (uses someone else's stem cells) Autoimmune diseases, such as Crohn disease, ulcerative colitis, lupus History of chest radiation Nervous system problems, such as Guillain-Barre syndrome, myasthenia gravis Organ transplant An unusual or allergic reaction to durvalumab, other medications, foods, dyes, or preservatives Pregnant or trying to get pregnant Breast-feeding How should I use this medication? This medication is infused into a vein. It is given by your care team in a hospital or clinic setting. A special MedGuide will be given to you before each treatment. Be sure to read this information carefully each time. Talk to your care team about the use of this medication in  children. Special care may be needed. Overdosage: If you think you have taken too much of this medicine contact a poison control center or emergency room at once. NOTE: This medicine is only for you. Do not share this medicine with others. What if I miss a dose? Keep appointments for follow-up doses. It is important not to miss your dose. Call your care team if you are unable to keep an appointment. What may interact with this medication? Interactions have not been studied. This list may not describe all possible interactions. Give your health care provider a list of all the medicines, herbs, non-prescription drugs, or dietary supplements you use. Also tell them if you smoke, drink alcohol, or use illegal drugs. Some items may interact with your medicine. What should I watch for while using this medication? Your condition will be monitored carefully while you are receiving this medication. You may need blood work while taking this medication. This medication may cause serious skin reactions. They can happen weeks to months after starting the medication. Contact your care team right away if you notice fevers or flu-like symptoms with a rash. The rash may be red or purple and then turn into blisters or peeling of the skin. You may also  notice a red rash with swelling of the face, lips, or lymph nodes in your neck or under your arms. Tell your care team right away if you have any change in your eyesight. Talk to your care team if you may be pregnant. Serious birth defects can occur if you take this medication during pregnancy and for 3 months after the last dose. You will need a negative pregnancy test before starting this medication. Contraception is recommended while taking this medication and for 3 months after the last dose. Your care team can help you find the option that works for you. Do not breastfeed while taking this medication and for 3 months after the last dose. What side effects may I notice  from receiving this medication? Side effects that you should report to your care team as soon as possible: Allergic reactions--skin rash, itching, hives, swelling of the face, lips, tongue, or throat Dry cough, shortness of breath or trouble breathing Eye pain, redness, irritation, or discharge with blurry or decreased vision Heart muscle inflammation--unusual weakness or fatigue, shortness of breath, chest pain, fast or irregular heartbeat, dizziness, swelling of the ankles, feet, or hands Hormone gland problems--headache, sensitivity to light, unusual weakness or fatigue, dizziness, fast or irregular heartbeat, increased sensitivity to cold or heat, excessive sweating, constipation, hair loss, increased thirst or amount of urine, tremors or shaking, irritability Infusion reactions--chest pain, shortness of breath or trouble breathing, feeling faint or lightheaded Kidney injury (glomerulonephritis)--decrease in the amount of urine, red or dark brown urine, foamy or bubbly urine, swelling of the ankles, hands, or feet Liver injury--right upper belly pain, loss of appetite, nausea, light-colored stool, dark yellow or brown urine, yellowing skin or eyes, unusual weakness or fatigue Pain, tingling, or numbness in the hands or feet, muscle weakness, change in vision, confusion or trouble speaking, loss of balance or coordination, trouble walking, seizures Rash, fever, and swollen lymph nodes Redness, blistering, peeling, or loosening of the skin, including inside the mouth Sudden or severe stomach pain, bloody diarrhea, fever, nausea, vomiting Side effects that usually do not require medical attention (report these to your care team if they continue or are bothersome): Bone, joint, or muscle pain Diarrhea Fatigue Loss of appetite Nausea Skin rash This list may not describe all possible side effects. Call your doctor for medical advice about side effects. You may report side effects to FDA at  1-800-FDA-1088. Where should I keep my medication? This medication is given in a hospital or clinic. It will not be stored at home. NOTE: This sheet is a summary. It may not cover all possible information. If you have questions about this medicine, talk to your doctor, pharmacist, or health care provider.  2024 Elsevier/Gold Standard (2021-06-11 00:00:00)

## 2022-08-15 NOTE — Progress Notes (Signed)
Order for IR evaluation and treat for liver directed therapy, demographics, insurance information, and last ov note faxed to [facility] imaging attention Vicky.

## 2022-08-15 NOTE — Progress Notes (Signed)
[facility] Clinical Social Work  Initial Assessment   Christine Hurley is a 72 y.o. year old female seen in infusion . Clinical Social Work was referred by medical provider for assessment of psychosocial needs.   SDOH (Social Determinants of Health) assessments performed: Yes SDOH Interventions    Flowsheet Row Telephone from 07/22/2022 in [facility] Clinical Support from 03/13/2022 in [facility]  SDOH Interventions    Food Insecurity Interventions Intervention Not Indicated Intervention Not Indicated  Housing Interventions -- Intervention Not Indicated  Transportation Interventions Intervention Not Indicated Intervention Not Indicated  Utilities Interventions -- Intervention Not Indicated  Financial Strain Interventions -- Intervention Not Indicated  Physical Activity Interventions -- Intervention Not Indicated  Stress Interventions -- Intervention Not Indicated  Social Connections Interventions -- Intervention Not Indicated       SDOH Screenings   Food Insecurity: No Food Insecurity (07/22/2022)  Housing: Low Risk  (07/19/2022)  Transportation Needs: No Transportation Needs (07/22/2022)  Utilities: Not At Risk (07/19/2022)  Depression (PHQ2-9): Low Risk  (07/28/2022)  Financial Resource Strain: Low Risk  (03/13/2022)  Physical Activity: Inactive (03/13/2022)  Social Connections: Moderately Isolated (03/13/2022)  Stress: No Stress Concern Present (03/13/2022)  Tobacco Use: High Risk (08/15/2022)     Distress Screen completed: No     No data to display            Family/Social Information:  Housing Arrangement: patient lives alone Family members/support persons in your life? Pt states she has a fiance who lives nearby, as well as her son who will be available to provide assistance and support as needed.  Pt also has a sister in [place] who can provide limited support.   Transportation concerns: no, CSW encouraged pt to look  into her Medicaid transportation benefits as a back up for transportation should it be needed while undergoing treatment  Employment: Retired .  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 yes Concerns about diagnosis and/or treatment: How I will pay for the services I need, How will I care for myself, and Quality of life Patient reported stressors: Finances Hopes and/or priorities: pt's priority is to engage in treatment w/ the hope of a positive response Patient enjoys time with family/ friends Current coping skills/ strengths: Capable of independent living , Motivation for treatment/growth , and Supportive family/friends     SUMMARY: Current SDOH Barriers:  Financial constraints related to fixed income  Clinical Social Work Clinical Goal(s):  Explore community resource options for unmet needs related to:  Financial Strain   Interventions: Discussed common feeling and emotions when being diagnosed with cancer, and the importance of support during treatment Informed patient of the support team roles and support services at [facility] Provided CSW contact information and encouraged patient to call with any questions or concerns Pt has spoken to the patient Dance movement psychotherapist and submitted paperwork to apply for [employer].  Pt provided w/ contact information for CSW should additional concerns arise throughout duration of treatment.     Follow Up Plan: Patient will contact CSW with any support or resource needs Patient verbalizes understanding of plan: Yes    Rachel Moulds, LCSW Clinical Social Worker [facility]

## 2022-08-15 NOTE — Progress Notes (Signed)
Per Bertis Ruddy MD, ok to treat today with ALT 86.

## 2022-08-15 NOTE — Progress Notes (Signed)
[facility]   [phone] [phone]   Clinic Follow up Note   Patient Care Team: Willow Ora, MD as PCP - General (Family Medicine) Willis Modena, MD as Consulting Physician (Gastroenterology) Aura Camps, MD as Consulting Physician (Ophthalmology) Aris Lot, MD as Consulting Physician (Dermatology) Dahlia Byes, [facility] (Inactive) as Pharmacist (Pharmacist) Malachy Mood, MD as Consulting Physician (Oncology)  Date of Service:  08/15/2022  CHIEF COMPLAINT: f/u of Hepatocellular Carcinoma  CURRENT THERAPY:  Tremelimumab-actl D1 C1 + Durvalumab q28d   ASSESSMENT:  Christine Hurley is a 72 y.o. female with   Hepatocellular carcinoma (HCC) cT3N0M0, stage IIIA, BCLC advanced stage (C) -Patient was hospitalized for colitis on 07/18/2022, CT scan showed multiple liver masses -Liver MRI on July 19, 2022 showed extensive tumor identified throughout both lobes of the liver, radiographic characteristic are not consistent with hepatocellular carcinoma, her AFP was 85, with her history of hepatitis C, this is diagnostic for multifocal HCC or HCC with intrahepatic metastasis -I do not think she needs liver biopsy. -I will review her case in our GI tumor board next week. -I discussed the incurable nature of her liver cancer, due to the diffuse involvement of both lobes.  She is not a candidate for liver resection or liver transplant. -I recommend first line therapy with Durvalumab and Imjudo, plan to start today 7/5 -will refer her to IR for liver targeted therapy   Anorexia and fatigue  -Secondary to underlying malignancy -She reports benefit from marijuana, hype or prescribed Marinol for her low appetite -I encouraged her to have high-protein diet, and stay active -Will refer her to dietitian   PLAN: -lab reviewed -proceed with D1, C1 IMJUDO/IMFINZI  today -I prescribe Marinaol for appetite -Referral to dietician and SW -lab/flush and f/u   with a phone visit in 2 weeks     SUMMARY OF ONCOLOGIC HISTORY: Oncology History Overview Note   Cancer Staging  Hepatocellular carcinoma (HCC) Staging form: Liver, AJCC 8th Edition - Clinical stage from 07/24/2022: Stage IIIA (cT3, cN0, cM0) - Signed by Malachy Mood, MD on 07/24/2022     Hepatocellular carcinoma (HCC)  07/24/2022 Initial Diagnosis   Hepatocellular carcinoma (HCC)   07/24/2022 Cancer Staging   Staging form: Liver, AJCC 8th Edition - Clinical stage from 07/24/2022: Stage IIIA (cT3, cN0, cM0) - Signed by Malachy Mood, MD on 07/24/2022   08/15/2022 -  Chemotherapy   Patient is on Treatment Plan : Hepatocellular Carcinoma Tremelimumab-actl C1 D1 + Durvalumab q28d         INTERVAL HISTORY:  Christine Hurley is here for a follow up of Hepatocellular Carcinoma. She was last seen by me on 07/24/2022. She presents to the clinic accompanied  by son. Pt stet that she still has some abdominal pain, but it resolves when she use the restroom. Pt also report of having diarrhea.    All other systems were reviewed with the patient and are negative.  MEDICAL HISTORY:  Past Medical History:  Diagnosis Date   Abnormal nuclear stress test February '13   Small, partially reversible inferolateral ischemia. Marked hypertensive response to exercise. Low risk.   Arthritis    Chest pain with minimal risk for cardiac etiology February 2013   Evaluated with echocardiogram and Myoview, do   COPD (chronic obstructive pulmonary disease) (HCC)    Depression    Hepatitis C    Hypertension    Osteopenia of spine 05/22/2016   PAD (peripheral artery disease) (HCC) 10/01/2021   ABI: monophasic  and decreased ABIs. Refer to vascular   Right tibial fracture      SURGICAL HISTORY: Past Surgical History:  Procedure Laterality Date   DOPPLER ECHOCARDIOGRAPHY  03/26/2011   LVEF>55% normal LV wall thickness, normal LA, mild aortic sclerosis with trace to mild AI, mild to moderate TR, RVSP of , RA  pressure about 5 mmHg   NM MYOVIEW LTD  03/27/11   post stress ejection fraction is 72%, abnormal myocardial perfusion study, this is a low risk scan   TIBIA FRACTURE SURGERY Right 09/2008   fracture of right lower leg with open reduction and internal fixation    I have reviewed the social history and family history with the patient and they are unchanged from previous note.  ALLERGIES:  has No Known Allergies.  MEDICATIONS:  Current Outpatient Medications  Medication Sig Dispense Refill   dronabinol (MARINOL) 2.5 MG capsule Take 1 capsule (2.5 mg total) by mouth 2 (two) times daily before a meal. 60 capsule 0   albuterol (VENTOLIN HFA) 108 (90 Base) MCG/ACT inhaler INHALE 1 TO 2 PUFFS INTO THE LUNGS EVERY 6 HOURS AS NEEDED FOR WHEEZING OR SHORTNESS OF BREATH (Patient taking differently: Inhale 2 puffs into the lungs every 6 (six) hours as needed for wheezing or shortness of breath.) 6.7 g 2   amLODipine (NORVASC) 10 MG tablet Take 1 tablet (10 mg total) by mouth daily. 90 tablet 3   apixaban (ELIQUIS) 5 MG TABS tablet Take 1 tablet (5mg ) by mouth twice daily 90 tablet 1   APIXABAN (ELIQUIS) VTE STARTER PACK (10MG  AND 5MG ) Take 2 tablets (10 mg) twice daily x 1 week, after 1 week switch to 1 tablet (5 mg) twice daily 74 tablet 0   atorvastatin (LIPITOR) 10 MG tablet TAKE 1 TABLET(10 MG) BY MOUTH AT BEDTIME (Patient taking differently: Take 10 mg by mouth daily.) 90 tablet 3   lisinopril-hydrochlorothiazide (ZESTORETIC) 20-12.5 MG tablet Take 2 tablets by mouth daily. 180 tablet 3   prochlorperazine (COMPAZINE) 10 MG tablet Take 1 tablet (10 mg total) by mouth every 6 (six) hours as needed for nausea or vomiting. 30 tablet 1   SPIRIVA RESPIMAT 2.5 MCG/ACT AERS INHALE 2 PUFFS INTO THE LUNGS DAILY 4 g 11   No current facility-administered medications for this visit.    PHYSICAL EXAMINATION: ECOG PERFORMANCE STATUS: 1 - Symptomatic but completely ambulatory  Vitals:   08/15/22 1014  BP:  (!) 156/90  Pulse: 80  Resp: 18  Temp: 98.4 F (36.9 C)  SpO2: 99%   Wt Readings from Last 3 Encounters:  08/15/22 143 lb 1.6 oz (64.9 kg)  07/28/22 144 lb 12.8 oz (65.7 kg)  07/24/22 146 lb 8 oz (66.5 kg)     GENERAL:alert, no distress and comfortable SKIN: skin color normal, no rashes or significant lesions EYES: normal, Conjunctiva are pink and non-injected, sclera clear  NEURO: alert & oriented x 3 with fluent speech   LABORATORY DATA:  I have reviewed the data as listed    Latest Ref Rng & Units 08/15/2022    9:35 AM 07/28/2022    3:03 PM 07/21/2022    4:45 AM  CBC  WBC 4.0 - 10.5 K/uL 5.7  6.0  5.8   Hemoglobin 12.0 - 15.0 g/dL 09.8  11.9  14.7   Hematocrit 36.0 - 46.0 % 36.4  39.9  35.6   Platelets 150 - 400 K/uL 217  294.0  185         Latest Ref Rng &  Units 08/15/2022    9:35 AM 07/28/2022    3:03 PM 07/21/2022    4:45 AM  CMP  Glucose 70 - 99 mg/dL 161  99  096   BUN 8 - 23 mg/dL 16  12  11    Creatinine 0.44 - 1.00 mg/dL 0.45  4.09  8.11   Sodium 135 - 145 mmol/L 138  134  134   Potassium 3.5 - 5.1 mmol/L 3.3  4.0  3.2   Chloride 98 - 111 mmol/L 105  100  103   CO2 22 - 32 mmol/L 22  24  21    Calcium 8.9 - 10.3 mg/dL 91.4  9.5  8.8   Total Protein 6.5 - 8.1 g/dL 7.6  8.1  6.8   Total Bilirubin 0.3 - 1.2 mg/dL 0.5  0.6  0.5   Alkaline Phos 38 - 126 U/L 340  284  217   AST 15 - 41 U/L 67  59  53   ALT 0 - 44 U/L 86  72  86       RADIOGRAPHIC STUDIES: I have personally reviewed the radiological images as listed and agreed with the findings in the report. No results found.    Orders Placed This Encounter  Procedures   Ambulatory Referral to [facility] Nutrition    Referral Priority:   Routine    Referral Type:   Consultation    Referral Reason:   Specialty Services Required    Number of Visits Requested:   1   All questions were answered. The patient knows to call the clinic with any problems, questions or concerns. No barriers to learning was  detected. The total time spent in the appointment was 30 minutes.     Malachy Mood, MD 08/15/2022   Carolin Coy, CMA, am acting as scribe for Malachy Mood, MD.   I have reviewed the above documentation for accuracy and completeness, and I agree with the above.

## 2022-08-16 ENCOUNTER — Other Ambulatory Visit (HOSPITAL_COMMUNITY): Payer: Self-pay

## 2022-08-16 LAB — T4: T4, Total: 9.5 ug/dL (ref 4.5–12.0)

## 2022-08-16 LAB — AFP TUMOR MARKER: AFP, Serum, Tumor Marker: 94.6 ng/mL — ABNORMAL HIGH (ref 0.0–9.2)

## 2022-08-18 ENCOUNTER — Telehealth: Payer: Self-pay | Admitting: Hematology

## 2022-08-18 ENCOUNTER — Other Ambulatory Visit: Payer: Self-pay

## 2022-08-18 ENCOUNTER — Other Ambulatory Visit (HOSPITAL_COMMUNITY): Payer: Self-pay

## 2022-08-18 ENCOUNTER — Other Ambulatory Visit: Payer: Self-pay | Admitting: Hematology

## 2022-08-18 ENCOUNTER — Telehealth: Payer: Self-pay

## 2022-08-18 MED ORDER — DRONABINOL 2.5 MG PO CAPS: 2.5000 mg | ORAL_CAPSULE | Freq: Two times a day (BID) | ORAL | 0 refills | Status: DC

## 2022-08-18 NOTE — Telephone Encounter (Signed)
Called pt to see how she did with her treatment.  She reports doing well.  She reports some diarrhea but not more than 4/24 hrs.  Discussed low fiber foods, gatorade, propel, & imodium.  Instructed to call back if this doesn't help.

## 2022-08-18 NOTE — Telephone Encounter (Signed)
Pt called stating the prescription Dr. [name] sent in for Marinol to [facility] on [address] the pharmacy is out of stock on the Marinol.  Pt stated the [facility] on [address] has Marinol in stock.  Pt is requesting if Dr. [name] could send the prescription to the [facility] location.  Informed pt that Dr. [name] will send the Marinol prescription to the [facility] location by the close of business today since Dr. [name] is seeing pt in clinic today.  Pt verbalized understanding and had no further questions or concerns at this time.  Notified Dr. [name] of the pt's request.

## 2022-08-18 NOTE — Telephone Encounter (Signed)
-----   Message from Rae Roam, RN sent at 08/15/2022  2:24 PM EDT ----- Regarding: Dr [name] pt, first time Imjudo and Imfinzi Dr [name] pt came in 7/5 for first time Imjudo and Imfinzi. Tolerated infusions well. Needs call back.

## 2022-08-21 ENCOUNTER — Encounter: Payer: Self-pay | Admitting: Hematology

## 2022-08-21 NOTE — Progress Notes (Signed)
Patient signed grant approval on 08/15/22. Patient approved for one-time $1000 [employer] grant to assist with personal household expenses while going through treatment. She was provided a copy of approval letter and expense sheet with details on expenses covered.  She also has my card with any questions regarding the [employer] and to discuss expenses in detail as well as for any additional financial questions or concerns.

## 2022-08-29 ENCOUNTER — Other Ambulatory Visit: Payer: Medicare HMO

## 2022-08-29 ENCOUNTER — Other Ambulatory Visit: Payer: Self-pay

## 2022-08-31 ENCOUNTER — Other Ambulatory Visit: Payer: Self-pay | Admitting: Acute Care

## 2022-08-31 DIAGNOSIS — Z87891 Personal history of nicotine dependence: Secondary | ICD-10-CM

## 2022-08-31 DIAGNOSIS — Z122 Encounter for screening for malignant neoplasm of respiratory organs: Secondary | ICD-10-CM

## 2022-08-31 DIAGNOSIS — F17210 Nicotine dependence, cigarettes, uncomplicated: Secondary | ICD-10-CM

## 2022-09-03 ENCOUNTER — Encounter: Payer: Medicare HMO | Admitting: Physician Assistant

## 2022-09-03 ENCOUNTER — Other Ambulatory Visit: Payer: Self-pay

## 2022-09-03 ENCOUNTER — Telehealth: Payer: Self-pay | Admitting: *Deleted

## 2022-09-03 DIAGNOSIS — C220 Liver cell carcinoma: Secondary | ICD-10-CM

## 2022-09-03 NOTE — Telephone Encounter (Signed)
Pt states 3 days ago she noticed a dark area on her inner thigh and then it spread to the other leg. Is now "itching from head to toe" and has a scaly patch on her back. Has been using neosporin, cocoa butter and anti-itch cream. Discussed taking benadryl tablets to help with itching, cautioned that it can make her sleepy.  Notified patient that our NP in the [facility] can see her tomorrow at 0930.

## 2022-09-03 NOTE — Progress Notes (Unsigned)
Symptom Management Consult Note [facility]    Patient Care Team: Willow Ora, MD as PCP - General (Family Medicine) Willis Modena, MD as Consulting Physician (Gastroenterology) Aura Camps, MD as Consulting Physician (Ophthalmology) Aris Lot, MD as Consulting Physician (Dermatology) Dahlia Byes, [facility] (Inactive) as Pharmacist (Pharmacist) Malachy Mood, MD as Consulting Physician (Oncology)    Name / MRN / DOB: Christine Hurley  [id number]  November 03, 1950   Date of visit: 09/04/2022   Chief Complaint/Reason for visit: rash, itching   Current Therapy: Durvalumab and Imjudo   Last treatment:  Day 1   Cycle 1 on 08/15/22   ASSESSMENT & PLAN: Patient is a 72 y.o. female  with oncologic history of hepatocellular carcinoma followed by Dr. [name].  I have viewed most recent oncology note and lab work.    #Hepatocellular carcinoma - Next appointment with oncologist is tomorrow 09/05/22  #Pruritus -Possible Imjudo- AE itching (23%). Next treatment is Durvalumab only.  -Patient received IV pepcid in clinic and 1L NS for hydration support. -Will prescribe Pepcid x 5 days and encourage OTC benadryl q6 hours x 5 days as well. If causes sedation patient will switch to daily zyrtec.Also sent prescription for topical steroid.   #Ecchymosis -Unclear etiology. -Normal HgB and platelets. No abnormal bleeding. Will continue to monitor for now.   #Transaminitis -Acute. AST 216 increased from 67 and ALT 220 from 86 x 2 weeks ago. -Benign abdominal exam. -As patient has normal t bili plan after discussion with Dr. [name] is to closely monitor labs without acute intervention at this time.  Strict ED precautions discussed should symptoms worsen.  Heme/Onc History: Oncology History Overview Note   Cancer Staging  Hepatocellular carcinoma [facility]) Staging form: Liver, AJCC 8th Edition - Clinical stage from 07/24/2022: Stage IIIA (cT3, cN0, cM0) - Signed by Malachy Mood,  MD on 07/24/2022     Hepatocellular carcinoma (HCC)  07/24/2022 Initial Diagnosis   Hepatocellular carcinoma (HCC)   07/24/2022 Cancer Staging   Staging form: Liver, AJCC 8th Edition - Clinical stage from 07/24/2022: Stage IIIA (cT3, cN0, cM0) - Signed by Malachy Mood, MD on 07/24/2022   08/15/2022 -  Chemotherapy   Patient is on Treatment Plan : Hepatocellular Carcinoma Tremelimumab-actl C1 D1 + Durvalumab q28d          Interval history-: Christine Hurley is a 72 y.o. female with oncologic history as above presenting to [facility] today with chief complaint of itching and rash.  She presents unaccompanied to clinic.  Patient states she developed itching on her trunk x 3 days ago.  She also has an area of dry skin on her lower back which is new x 4 days ago.  She has been applying cocoa butter to help moisturize the area as well as applying OTC anti-itch cream.  She feels as if the cream gives her temporary symptom relief.  Patient states itching sensation is intermittent.  She reports she took an iron supplement pill for the first time x 1 week ago, she thinks it was [employer].  Otherwise she denies any new medications, lotions or soaps.  She feels as if she overall tolerated her first treatment well.  Patient did notice some bruising that started on both of her inner thighs and does not remember any injury that could have caused it. She denies any abnormal bleeding or bruising anywhere else. denies fever, chills shortness of breath      ROS  All other systems are reviewed and are  negative for acute change except as noted in the HPI.    No Known Allergies   Past Medical History:  Diagnosis Date  . Abnormal nuclear stress test February '13   Small, partially reversible inferolateral ischemia. Marked hypertensive response to exercise. Low risk.  . Arthritis   . Chest pain with minimal risk for cardiac etiology February 2013   Evaluated with echocardiogram and Myoview, do  . COPD (chronic  obstructive pulmonary disease) (HCC)   . Depression   . Hepatitis C   . Hypertension   . Osteopenia of spine 05/22/2016  . PAD (peripheral artery disease) (HCC) 10/01/2021   ABI: monophasic and decreased ABIs. Refer to vascular  . Right tibial fracture       Past Surgical History:  Procedure Laterality Date  . DOPPLER ECHOCARDIOGRAPHY  03/26/2011   LVEF>55% normal LV wall thickness, normal LA, mild aortic sclerosis with trace to mild AI, mild to moderate TR, RVSP of , RA pressure about 5 mmHg  . NM MYOVIEW LTD  03/27/11   post stress ejection fraction is 72%, abnormal myocardial perfusion study, this is a low risk scan  . TIBIA FRACTURE SURGERY Right 09/2008   fracture of right lower leg with open reduction and internal fixation    Social History   Socioeconomic History  . Marital status: Divorced    Spouse name: Not on file  . Number of children: 1  . Years of education: Not on file  . Highest education level: Not on file  Occupational History  . Not on file  Tobacco Use  . Smoking status: Every Day    Current packs/day: 0.25    Average packs/day: 0.3 packs/day for 45.0 years (11.3 ttl pk-yrs)    Types: Cigarettes  . Smokeless tobacco: Never  . Tobacco comments:    2 cigs per day   Vaping Use  . Vaping status: Never Used  Substance and Sexual Activity  . Alcohol use: Yes    Alcohol/week: 2.0 standard drinks of alcohol    Types: 2 Cans of beer per week    Comment: socially   . Drug use: Not Currently    Types: Marijuana    Comment: 2 days ago   . Sexual activity: Yes    Birth control/protection: Post-menopausal  Other Topics Concern  . Not on file  Social History Narrative  . Not on file   Social Determinants of Health   Financial Resource Strain: Low Risk  (03/13/2022)   Overall Financial Resource Strain (CARDIA)   . Difficulty of Paying Living Expenses: Not hard at all  Food Insecurity: No Food Insecurity (07/22/2022)   Hunger Vital Sign   . Worried  About Programme researcher, broadcasting/film/video in the Last Year: Never true   . Ran Out of Food in the Last Year: Never true  Transportation Needs: No Transportation Needs (07/22/2022)   PRAPARE - Transportation   . Lack of Transportation (Medical): No   . Lack of Transportation (Non-Medical): No  Physical Activity: Inactive (03/13/2022)   Exercise Vital Sign   . Days of Exercise per Week: 0 days   . Minutes of Exercise per Session: 0 min  Stress: No Stress Concern Present (03/13/2022)   [employer] of Occupational Health - Occupational Stress Questionnaire   . Feeling of Stress : Not at all  Social Connections: Moderately Isolated (03/13/2022)   Social Connection and Isolation Panel [NHANES]   . Frequency of Communication with Friends and Family: More than three times a week   .  Frequency of Social Gatherings with Friends and Family: More than three times a week   . Attends Religious Services: More than 4 times per year   . Active Member of Clubs or Organizations: No   . Attends Banker Meetings: Never   . Marital Status: Divorced  Catering manager Violence: Not At Risk (07/19/2022)   Humiliation, Afraid, Rape, and Kick questionnaire   . Fear of Current or Ex-Partner: No   . Emotionally Abused: No   . Physically Abused: No   . Sexually Abused: No    Family History  Problem Relation Age of Onset  . Asthma Mother   . Cancer Mother        Brain  . Cancer - Prostate Father   . Cancer Maternal Grandmother        GYN cancer     Current Outpatient Medications:  .  [START ON 09/05/2022] famotidine (PEPCID) 20 MG tablet, Take 1 tablet (20 mg total) by mouth 2 (two) times daily for 5 days., Disp: 10 tablet, Rfl: 0 .  hydrocortisone 1 % ointment, Apply 1 Application topically 2 (two) times daily., Disp: 30 g, Rfl: 0 .  albuterol (VENTOLIN HFA) 108 (90 Base) MCG/ACT inhaler, INHALE 1 TO 2 PUFFS INTO THE LUNGS EVERY 6 HOURS AS NEEDED FOR WHEEZING OR SHORTNESS OF BREATH (Patient taking differently:  Inhale 2 puffs into the lungs every 6 (six) hours as needed for wheezing or shortness of breath.), Disp: 6.7 g, Rfl: 2 .  amLODipine (NORVASC) 10 MG tablet, Take 1 tablet (10 mg total) by mouth daily., Disp: 90 tablet, Rfl: 3 .  apixaban (ELIQUIS) 5 MG TABS tablet, Take 1 tablet (5mg ) by mouth twice daily, Disp: 90 tablet, Rfl: 1 .  APIXABAN (ELIQUIS) VTE STARTER PACK (10MG  AND 5MG ), Take 2 tablets (10 mg) twice daily x 1 week, after 1 week switch to 1 tablet (5 mg) twice daily, Disp: 74 tablet, Rfl: 0 .  atorvastatin (LIPITOR) 10 MG tablet, TAKE 1 TABLET(10 MG) BY MOUTH AT BEDTIME (Patient taking differently: Take 10 mg by mouth daily.), Disp: 90 tablet, Rfl: 3 .  dronabinol (MARINOL) 2.5 MG capsule, Take 1 capsule (2.5 mg total) by mouth 2 (two) times daily before a meal., Disp: 60 capsule, Rfl: 0 .  lisinopril-hydrochlorothiazide (ZESTORETIC) 20-12.5 MG tablet, Take 2 tablets by mouth daily., Disp: 180 tablet, Rfl: 3 .  prochlorperazine (COMPAZINE) 10 MG tablet, Take 1 tablet (10 mg total) by mouth every 6 (six) hours as needed for nausea or vomiting., Disp: 30 tablet, Rfl: 1 .  SPIRIVA RESPIMAT 2.5 MCG/ACT AERS, INHALE 2 PUFFS INTO THE LUNGS DAILY, Disp: 4 g, Rfl: 11  PHYSICAL EXAM: ECOG FS:1 - Symptomatic but completely ambulatory    Vitals:   09/04/22 0936 09/04/22 1147  BP: 128/83 133/76  Pulse: (!) 108 96  Resp: 16 16  Temp: 98.3 F (36.8 C)   TempSrc: Oral   SpO2: 98% 98%  Weight: 143 lb (64.9 kg)    Physical Exam Vitals and nursing note reviewed.  Constitutional:      Appearance: She is not ill-appearing or toxic-appearing.  HENT:     Head: Normocephalic.  Eyes:     Conjunctiva/sclera: Conjunctivae normal.  Cardiovascular:     Rate and Rhythm: Normal rate and regular rhythm.     Pulses: Normal pulses.     Heart sounds: Normal heart sounds.  Pulmonary:     Effort: Pulmonary effort is normal.     Breath sounds: Normal  breath sounds.  Abdominal:     General: There is  no distension.     Palpations: Abdomen is soft.     Tenderness: There is no abdominal tenderness.  Musculoskeletal:     Cervical back: Normal range of motion.  Skin:    General: Skin is warm and dry.     Findings: No petechiae.     Comments: Please see media below  Neurological:     Mental Status: She is alert.          LABORATORY DATA: I have reviewed the data as listed    Latest Ref Rng & Units 09/04/2022    9:55 AM 08/15/2022    9:35 AM 07/28/2022    3:03 PM  CBC  WBC 4.0 - 10.5 K/uL 6.1  5.7  6.0   Hemoglobin 12.0 - 15.0 g/dL 40.9  81.1  91.4   Hematocrit 36.0 - 46.0 % 35.5  36.4  39.9   Platelets 150 - 400 K/uL 284  217  294.0         Latest Ref Rng & Units 09/04/2022    9:55 AM 08/15/2022    9:35 AM 07/28/2022    3:03 PM  CMP  Glucose 70 - 99 mg/dL 95  782  99   BUN 8 - 23 mg/dL 18  16  12    Creatinine 0.44 - 1.00 mg/dL 9.56  2.13  0.86   Sodium 135 - 145 mmol/L 137  138  134   Potassium 3.5 - 5.1 mmol/L 3.9  3.3  4.0   Chloride 98 - 111 mmol/L 106  105  100   CO2 22 - 32 mmol/L 23  22  24    Calcium 8.9 - 10.3 mg/dL 9.5  57.8  9.5   Total Protein 6.5 - 8.1 g/dL 7.4  7.6  8.1   Total Bilirubin 0.3 - 1.2 mg/dL 0.5  0.5  0.6   Alkaline Phos 38 - 126 U/L 323  340  284   AST 15 - 41 U/L 216  67  59   ALT 0 - 44 U/L 220  86  72        RADIOGRAPHIC STUDIES (from last 24 hours if applicable) I have personally reviewed the radiological images as listed and agreed with the findings in the report. No results found.      Visit Diagnosis: 1. Hepatocellular carcinoma (HCC)   2. Pruritus   3. Transaminitis      No orders of the defined types were placed in this encounter.   All questions were answered. The patient knows to call the clinic with any problems, questions or concerns. No barriers to learning was detected.  A total of more than 30 minutes were spent on this encounter with face-to-face time and non-face-to-face time, including preparing to see the  patient, ordering tests and/or medications, counseling the patient and coordination of care as outlined above.    Thank you for allowing me to participate in the care of this patient.    Shanon Ace, PA-C Department of Hematology/Oncology [facility] Phone: [phone]  [phone]    09/04/2022 12:16 PM

## 2022-09-04 ENCOUNTER — Inpatient Hospital Stay (HOSPITAL_BASED_OUTPATIENT_CLINIC_OR_DEPARTMENT_OTHER): Payer: Medicare HMO | Admitting: Physician Assistant

## 2022-09-04 ENCOUNTER — Inpatient Hospital Stay: Payer: Medicare HMO

## 2022-09-04 ENCOUNTER — Other Ambulatory Visit: Payer: Self-pay

## 2022-09-04 ENCOUNTER — Encounter: Payer: Self-pay | Admitting: Hematology

## 2022-09-04 VITALS — BP 133/76 | HR 96 | Temp 98.3°F | Resp 16 | Wt 143.0 lb

## 2022-09-04 DIAGNOSIS — L299 Pruritus, unspecified: Secondary | ICD-10-CM | POA: Diagnosis not present

## 2022-09-04 DIAGNOSIS — R7401 Elevation of levels of liver transaminase levels: Secondary | ICD-10-CM | POA: Diagnosis not present

## 2022-09-04 DIAGNOSIS — C220 Liver cell carcinoma: Secondary | ICD-10-CM

## 2022-09-04 DIAGNOSIS — Z79899 Other long term (current) drug therapy: Secondary | ICD-10-CM | POA: Diagnosis not present

## 2022-09-04 DIAGNOSIS — Z5112 Encounter for antineoplastic immunotherapy: Secondary | ICD-10-CM | POA: Diagnosis not present

## 2022-09-04 LAB — CBC WITH DIFFERENTIAL (CANCER CENTER ONLY)
Abs Immature Granulocytes: 0.02 10*3/uL (ref 0.00–0.07)
Basophils Absolute: 0 10*3/uL (ref 0.0–0.1)
Basophils Relative: 1 %
Eosinophils Absolute: 0.1 10*3/uL (ref 0.0–0.5)
Eosinophils Relative: 1 %
HCT: 35.5 % — ABNORMAL LOW (ref 36.0–46.0)
Hemoglobin: 12.3 g/dL (ref 12.0–15.0)
Immature Granulocytes: 0 %
Lymphocytes Relative: 11 %
Lymphs Abs: 0.6 10*3/uL — ABNORMAL LOW (ref 0.7–4.0)
MCH: 30 pg (ref 26.0–34.0)
MCHC: 34.6 g/dL (ref 30.0–36.0)
MCV: 86.6 fL (ref 80.0–100.0)
Monocytes Absolute: 0.3 10*3/uL (ref 0.1–1.0)
Monocytes Relative: 5 %
Neutro Abs: 5 10*3/uL (ref 1.7–7.7)
Neutrophils Relative %: 82 %
Platelet Count: 284 10*3/uL (ref 150–400)
RBC: 4.1 MIL/uL (ref 3.87–5.11)
RDW: 14.9 % (ref 11.5–15.5)
WBC Count: 6.1 10*3/uL (ref 4.0–10.5)
nRBC: 0 % (ref 0.0–0.2)

## 2022-09-04 LAB — CMP (CANCER CENTER ONLY)
ALT: 220 U/L — ABNORMAL HIGH (ref 0–44)
AST: 216 U/L (ref 15–41)
Albumin: 3.9 g/dL (ref 3.5–5.0)
Alkaline Phosphatase: 323 U/L — ABNORMAL HIGH (ref 38–126)
Anion gap: 8 (ref 5–15)
BUN: 18 mg/dL (ref 8–23)
CO2: 23 mmol/L (ref 22–32)
Calcium: 9.5 mg/dL (ref 8.9–10.3)
Chloride: 106 mmol/L (ref 98–111)
Creatinine: 0.71 mg/dL (ref 0.44–1.00)
GFR, Estimated: >60 mL/min (ref >60)
Glucose, Bld: 95 mg/dL (ref 70–99)
Potassium: 3.9 mmol/L (ref 3.5–5.1)
Sodium: 137 mmol/L (ref 135–145)
Total Bilirubin: 0.5 mg/dL (ref 0.3–1.2)
Total Protein: 7.4 g/dL (ref 6.5–8.1)

## 2022-09-04 MED ORDER — FAMOTIDINE IN NACL 20-0.9 MG/50ML-% IV SOLN
20.0000 mg | Freq: Once | INTRAVENOUS | Status: AC
  Administered 2022-09-04: 20 mg via INTRAVENOUS
  Filled 2022-09-04: qty 50

## 2022-09-04 MED ORDER — SODIUM CHLORIDE 0.9 % IV SOLN: Freq: Once | INTRAVENOUS | Status: AC

## 2022-09-04 MED ORDER — FAMOTIDINE 20 MG PO TABS: 20.0000 mg | ORAL_TABLET | Freq: Two times a day (BID) | ORAL | 0 refills | Status: DC

## 2022-09-04 MED ORDER — HYDROCORTISONE 1 % EX OINT: 1.0000 | TOPICAL_OINTMENT | Freq: Two times a day (BID) | CUTANEOUS | 0 refills | Status: DC

## 2022-09-04 NOTE — Progress Notes (Signed)
Patient called today to inquire about grant expenses and how it works.  Went over in detail expenses and how they are submitted and covered. She verbalized understanding.    She has my card for any additional financial questions or concerns.

## 2022-09-04 NOTE — Patient Instructions (Addendum)
Prescription sent to pharmacy for Pepcid.  Take this as prescribed.  You should also take Benadryl 25 mg every 6 hours to help with the itching.  This medication can be purchased over-the-counter.  It can cause you to be drowsy so do not take it before driving or needing to do anything important.  If Benadryl makes you too drowsy then you can switch to daily Zyrtec.  Asked the pharmacist at [facility] if you have any questions about this.  Prescription sent to the pharmacy for hydrocortisone ointment.  This is a steroid cream that should help with rash and itching as well.  If symptoms worsen or new ones develop please seek emergency medical care.

## 2022-09-04 NOTE — Progress Notes (Signed)
CRITICAL VALUE STICKER  CRITICAL VALUE: AST [id number]  RECEIVER (on-site recipient of call): Henriette Combs, RN   DATE & TIME NOTIFIED: 09/04/22, 1059  MESSENGER (representative from lab): Herbert Seta  MD NOTIFIED: Namon Cirri, PA-C to notify Dr. [name]  TIME OF NOTIFICATION: 1100  RESPONSE: IV steroids.

## 2022-09-05 ENCOUNTER — Inpatient Hospital Stay: Payer: Medicare HMO | Admitting: Hematology

## 2022-09-05 ENCOUNTER — Telehealth: Payer: Self-pay

## 2022-09-05 NOTE — Telephone Encounter (Signed)
Per Dr. Latanya Maudlin request, attempted to reach out to patient in regards to telephone visit today. Patient was seen in the clinic yesterday and does not need additional appointment today. LVM for patient to return call to determine whether or not she would be okay with canceling appointment.

## 2022-09-08 ENCOUNTER — Inpatient Hospital Stay (HOSPITAL_COMMUNITY)
Admission: EM | Admit: 2022-09-08 | Discharge: 2022-09-12 | DRG: 286 | Disposition: A | Discharge status: Home / Self Care | Payer: Medicare HMO | Source: Ambulatory Visit | Attending: Family Medicine | Admitting: Family Medicine

## 2022-09-08 ENCOUNTER — Other Ambulatory Visit: Payer: Self-pay

## 2022-09-08 ENCOUNTER — Encounter (HOSPITAL_COMMUNITY): Payer: Self-pay | Admitting: Emergency Medicine

## 2022-09-08 ENCOUNTER — Emergency Department (HOSPITAL_COMMUNITY): Payer: Medicare HMO

## 2022-09-08 DIAGNOSIS — Z86718 Personal history of other venous thrombosis and embolism: Secondary | ICD-10-CM

## 2022-09-08 DIAGNOSIS — I2510 Atherosclerotic heart disease of native coronary artery without angina pectoris: Secondary | ICD-10-CM | POA: Diagnosis not present

## 2022-09-08 DIAGNOSIS — R079 Chest pain, unspecified: Secondary | ICD-10-CM | POA: Diagnosis not present

## 2022-09-08 DIAGNOSIS — C220 Liver cell carcinoma: Secondary | ICD-10-CM | POA: Diagnosis not present

## 2022-09-08 DIAGNOSIS — E7800 Pure hypercholesterolemia, unspecified: Secondary | ICD-10-CM

## 2022-09-08 DIAGNOSIS — I951 Orthostatic hypotension: Secondary | ICD-10-CM | POA: Diagnosis present

## 2022-09-08 DIAGNOSIS — K529 Noninfective gastroenteritis and colitis, unspecified: Secondary | ICD-10-CM | POA: Diagnosis present

## 2022-09-08 DIAGNOSIS — Z7901 Long term (current) use of anticoagulants: Secondary | ICD-10-CM

## 2022-09-08 DIAGNOSIS — T451X5A Adverse effect of antineoplastic and immunosuppressive drugs, initial encounter: Secondary | ICD-10-CM | POA: Diagnosis present

## 2022-09-08 DIAGNOSIS — F17210 Nicotine dependence, cigarettes, uncomplicated: Secondary | ICD-10-CM | POA: Diagnosis present

## 2022-09-08 DIAGNOSIS — I10 Essential (primary) hypertension: Secondary | ICD-10-CM | POA: Diagnosis present

## 2022-09-08 DIAGNOSIS — Z79899 Other long term (current) drug therapy: Secondary | ICD-10-CM

## 2022-09-08 DIAGNOSIS — I81 Portal vein thrombosis: Secondary | ICD-10-CM | POA: Diagnosis not present

## 2022-09-08 DIAGNOSIS — I252 Old myocardial infarction: Secondary | ICD-10-CM

## 2022-09-08 DIAGNOSIS — I493 Ventricular premature depolarization: Secondary | ICD-10-CM | POA: Diagnosis present

## 2022-09-08 DIAGNOSIS — E876 Hypokalemia: Secondary | ICD-10-CM | POA: Diagnosis present

## 2022-09-08 DIAGNOSIS — R0602 Shortness of breath: Secondary | ICD-10-CM | POA: Diagnosis not present

## 2022-09-08 DIAGNOSIS — H6690 Otitis media, unspecified, unspecified ear: Secondary | ICD-10-CM | POA: Diagnosis present

## 2022-09-08 DIAGNOSIS — B182 Chronic viral hepatitis C: Secondary | ICD-10-CM | POA: Diagnosis not present

## 2022-09-08 DIAGNOSIS — I214 Non-ST elevation (NSTEMI) myocardial infarction: Secondary | ICD-10-CM | POA: Diagnosis not present

## 2022-09-08 DIAGNOSIS — J449 Chronic obstructive pulmonary disease, unspecified: Secondary | ICD-10-CM | POA: Diagnosis present

## 2022-09-08 DIAGNOSIS — F32A Depression, unspecified: Secondary | ICD-10-CM | POA: Diagnosis not present

## 2022-09-08 DIAGNOSIS — I2489 Other forms of acute ischemic heart disease: Secondary | ICD-10-CM | POA: Diagnosis not present

## 2022-09-08 DIAGNOSIS — E860 Dehydration: Secondary | ICD-10-CM | POA: Diagnosis present

## 2022-09-08 DIAGNOSIS — R768 Other specified abnormal immunological findings in serum: Secondary | ICD-10-CM | POA: Diagnosis not present

## 2022-09-08 DIAGNOSIS — Z888 Allergy status to other drugs, medicaments and biological substances status: Secondary | ICD-10-CM

## 2022-09-08 DIAGNOSIS — Z221 Carrier of other intestinal infectious diseases: Secondary | ICD-10-CM

## 2022-09-08 DIAGNOSIS — R918 Other nonspecific abnormal finding of lung field: Secondary | ICD-10-CM | POA: Diagnosis not present

## 2022-09-08 DIAGNOSIS — R42 Dizziness and giddiness: Secondary | ICD-10-CM | POA: Diagnosis not present

## 2022-09-08 DIAGNOSIS — K828 Other specified diseases of gallbladder: Secondary | ICD-10-CM | POA: Diagnosis not present

## 2022-09-08 DIAGNOSIS — D630 Anemia in neoplastic disease: Secondary | ICD-10-CM | POA: Diagnosis present

## 2022-09-08 DIAGNOSIS — B1920 Unspecified viral hepatitis C without hepatic coma: Secondary | ICD-10-CM | POA: Diagnosis not present

## 2022-09-08 DIAGNOSIS — E782 Mixed hyperlipidemia: Secondary | ICD-10-CM | POA: Diagnosis not present

## 2022-09-08 DIAGNOSIS — R0902 Hypoxemia: Secondary | ICD-10-CM | POA: Diagnosis not present

## 2022-09-08 DIAGNOSIS — R21 Rash and other nonspecific skin eruption: Secondary | ICD-10-CM | POA: Diagnosis not present

## 2022-09-08 DIAGNOSIS — K7689 Other specified diseases of liver: Secondary | ICD-10-CM | POA: Diagnosis not present

## 2022-09-08 DIAGNOSIS — I739 Peripheral vascular disease, unspecified: Secondary | ICD-10-CM | POA: Diagnosis not present

## 2022-09-08 DIAGNOSIS — Z7902 Long term (current) use of antithrombotics/antiplatelets: Secondary | ICD-10-CM

## 2022-09-08 DIAGNOSIS — K769 Liver disease, unspecified: Secondary | ICD-10-CM | POA: Diagnosis not present

## 2022-09-08 DIAGNOSIS — X58XXXA Exposure to other specified factors, initial encounter: Secondary | ICD-10-CM | POA: Diagnosis present

## 2022-09-08 DIAGNOSIS — R000 Tachycardia, unspecified: Secondary | ICD-10-CM | POA: Diagnosis not present

## 2022-09-08 DIAGNOSIS — R188 Other ascites: Secondary | ICD-10-CM | POA: Diagnosis not present

## 2022-09-08 DIAGNOSIS — R160 Hepatomegaly, not elsewhere classified: Secondary | ICD-10-CM | POA: Diagnosis not present

## 2022-09-08 DIAGNOSIS — Z1152 Encounter for screening for COVID-19: Secondary | ICD-10-CM

## 2022-09-08 DIAGNOSIS — J9811 Atelectasis: Secondary | ICD-10-CM | POA: Diagnosis not present

## 2022-09-08 DIAGNOSIS — F17219 Nicotine dependence, cigarettes, with unspecified nicotine-induced disorders: Secondary | ICD-10-CM | POA: Diagnosis present

## 2022-09-08 DIAGNOSIS — Z825 Family history of asthma and other chronic lower respiratory diseases: Secondary | ICD-10-CM

## 2022-09-08 DIAGNOSIS — I2584 Coronary atherosclerosis due to calcified coronary lesion: Secondary | ICD-10-CM | POA: Diagnosis present

## 2022-09-08 DIAGNOSIS — R197 Diarrhea, unspecified: Secondary | ICD-10-CM | POA: Diagnosis not present

## 2022-09-08 DIAGNOSIS — I700 Atherosclerosis of aorta: Secondary | ICD-10-CM | POA: Diagnosis present

## 2022-09-08 LAB — CBC WITH DIFFERENTIAL/PLATELET
Abs Immature Granulocytes: 0.02 10*3/uL (ref 0.00–0.07)
Basophils Absolute: 0 10*3/uL (ref 0.0–0.1)
Basophils Relative: 0 %
Eosinophils Absolute: 0.1 10*3/uL (ref 0.0–0.5)
Eosinophils Relative: 1 %
HCT: 37.3 % (ref 36.0–46.0)
Hemoglobin: 12.3 g/dL (ref 12.0–15.0)
Immature Granulocytes: 0 %
Lymphocytes Relative: 10 %
Lymphs Abs: 0.8 10*3/uL (ref 0.7–4.0)
MCH: 29.1 pg (ref 26.0–34.0)
MCHC: 33 g/dL (ref 30.0–36.0)
MCV: 88.4 fL (ref 80.0–100.0)
Monocytes Absolute: 0.4 10*3/uL (ref 0.1–1.0)
Monocytes Relative: 5 %
Neutro Abs: 6 10*3/uL (ref 1.7–7.7)
Neutrophils Relative %: 84 %
Platelets: 289 10*3/uL (ref 150–400)
RBC: 4.22 MIL/uL (ref 3.87–5.11)
RDW: 15.3 % (ref 11.5–15.5)
WBC: 7.3 10*3/uL (ref 4.0–10.5)
nRBC: 0 % (ref 0.0–0.2)

## 2022-09-08 LAB — HEPARIN LEVEL (UNFRACTIONATED): Heparin Unfractionated: >1.1 IU/mL — ABNORMAL HIGH (ref 0.30–0.70)

## 2022-09-08 LAB — BASIC METABOLIC PANEL
Anion gap: 10 (ref 5–15)
BUN: 22 mg/dL (ref 8–23)
CO2: 20 mmol/L — ABNORMAL LOW (ref 22–32)
Calcium: 9 mg/dL (ref 8.9–10.3)
Chloride: 106 mmol/L (ref 98–111)
Creatinine, Ser: 0.81 mg/dL (ref 0.44–1.00)
GFR, Estimated: >60 mL/min (ref >60)
Glucose, Bld: 108 mg/dL — ABNORMAL HIGH (ref 70–99)
Potassium: 3.4 mmol/L — ABNORMAL LOW (ref 3.5–5.1)
Sodium: 136 mmol/L (ref 135–145)

## 2022-09-08 LAB — TROPONIN I (HIGH SENSITIVITY)
Troponin I (High Sensitivity): 370 ng/L (ref <18)
Troponin I (High Sensitivity): 414 ng/L (ref <18)
Troponin I (High Sensitivity): 467 ng/L (ref <18)
Troponin I (High Sensitivity): 472 ng/L (ref <18)

## 2022-09-08 LAB — SARS CORONAVIRUS 2 BY RT PCR: SARS Coronavirus 2 by RT PCR: NEGATIVE

## 2022-09-08 LAB — PROTIME-INR
INR: 1.3 — ABNORMAL HIGH (ref 0.8–1.2)
Prothrombin Time: 16.3 seconds — ABNORMAL HIGH (ref 11.4–15.2)

## 2022-09-08 LAB — APTT: aPTT: 31 seconds (ref 24–36)

## 2022-09-08 LAB — BRAIN NATRIURETIC PEPTIDE: B Natriuretic Peptide: 91.1 pg/mL (ref 0.0–100.0)

## 2022-09-08 MED ORDER — SODIUM CHLORIDE 0.9 % IV SOLN
500.0000 mg | Freq: Once | INTRAVENOUS | Status: AC
  Administered 2022-09-08: 500 mg via INTRAVENOUS
  Filled 2022-09-08: qty 5

## 2022-09-08 MED ORDER — ATORVASTATIN CALCIUM 10 MG PO TABS
10.0000 mg | ORAL_TABLET | Freq: Every day | ORAL | Status: DC
  Administered 2022-09-08: 10 mg via ORAL
  Filled 2022-09-08: qty 1

## 2022-09-08 MED ORDER — APIXABAN 5 MG PO TABS
5.0000 mg | ORAL_TABLET | Freq: Two times a day (BID) | ORAL | Status: DC
  Administered 2022-09-08: 5 mg via ORAL
  Filled 2022-09-08: qty 1

## 2022-09-08 MED ORDER — TIOTROPIUM BROMIDE MONOHYDRATE 2.5 MCG/ACT IN AERS: 1.0000 | INHALATION_SPRAY | Freq: Every day | RESPIRATORY_TRACT | Status: DC | PRN

## 2022-09-08 MED ORDER — NITROGLYCERIN 2 % TD OINT
1.0000 [in_us] | TOPICAL_OINTMENT | Freq: Once | TRANSDERMAL | Status: AC
  Administered 2022-09-08: 1 [in_us] via TOPICAL
  Filled 2022-09-08: qty 1

## 2022-09-08 MED ORDER — ONDANSETRON HCL 4 MG/2ML IJ SOLN: 4.0000 mg | Freq: Four times a day (QID) | INTRAMUSCULAR | Status: DC | PRN

## 2022-09-08 MED ORDER — LISINOPRIL 20 MG PO TABS
20.0000 mg | ORAL_TABLET | Freq: Two times a day (BID) | ORAL | Status: DC
  Administered 2022-09-08: 20 mg via ORAL
  Filled 2022-09-08: qty 1

## 2022-09-08 MED ORDER — DRONABINOL 2.5 MG PO CAPS
2.5000 mg | ORAL_CAPSULE | Freq: Two times a day (BID) | ORAL | Status: DC
  Administered 2022-09-10 – 2022-09-12 (×3): 2.5 mg via ORAL
  Filled 2022-09-08 (×4): qty 1

## 2022-09-08 MED ORDER — UMECLIDINIUM BROMIDE 62.5 MCG/ACT IN AEPB
1.0000 | INHALATION_SPRAY | Freq: Every day | RESPIRATORY_TRACT | Status: DC
  Administered 2022-09-09 – 2022-09-12 (×4): 1 via RESPIRATORY_TRACT
  Filled 2022-09-08: qty 7

## 2022-09-08 MED ORDER — ALBUTEROL SULFATE (2.5 MG/3ML) 0.083% IN NEBU
2.5000 mg | INHALATION_SOLUTION | Freq: Four times a day (QID) | RESPIRATORY_TRACT | Status: DC | PRN
  Administered 2022-09-09: 2.5 mg via RESPIRATORY_TRACT
  Filled 2022-09-08: qty 3

## 2022-09-08 MED ORDER — FAMOTIDINE 20 MG PO TABS
20.0000 mg | ORAL_TABLET | Freq: Two times a day (BID) | ORAL | Status: DC
  Administered 2022-09-09 – 2022-09-12 (×6): 20 mg via ORAL
  Filled 2022-09-08 (×7): qty 1

## 2022-09-08 MED ORDER — SODIUM CHLORIDE 0.9 % IV BOLUS
1000.0000 mL | Freq: Once | INTRAVENOUS | Status: AC
  Administered 2022-09-08: 1000 mL via INTRAVENOUS

## 2022-09-08 MED ORDER — ASPIRIN 81 MG PO CHEW
324.0000 mg | CHEWABLE_TABLET | Freq: Once | ORAL | Status: AC
  Administered 2022-09-08: 324 mg via ORAL
  Filled 2022-09-08: qty 4

## 2022-09-08 MED ORDER — ONDANSETRON HCL 4 MG/2ML IJ SOLN
4.0000 mg | Freq: Once | INTRAMUSCULAR | Status: AC
  Administered 2022-09-08: 4 mg via INTRAVENOUS
  Filled 2022-09-08: qty 2

## 2022-09-08 MED ORDER — SODIUM CHLORIDE 0.9 % IV SOLN
1.0000 g | Freq: Once | INTRAVENOUS | Status: AC
  Administered 2022-09-08: 1 g via INTRAVENOUS
  Filled 2022-09-08: qty 10

## 2022-09-08 MED ORDER — AMLODIPINE BESYLATE 5 MG PO TABS: 10.0000 mg | ORAL_TABLET | Freq: Every day | ORAL | Status: DC

## 2022-09-08 MED ORDER — CILOSTAZOL 100 MG PO TABS
100.0000 mg | ORAL_TABLET | Freq: Two times a day (BID) | ORAL | Status: DC
  Administered 2022-09-08 – 2022-09-12 (×7): 100 mg via ORAL
  Filled 2022-09-08 (×8): qty 1

## 2022-09-08 MED ORDER — METOPROLOL TARTRATE 25 MG PO TABS
25.0000 mg | ORAL_TABLET | Freq: Two times a day (BID) | ORAL | Status: DC
  Administered 2022-09-08 – 2022-09-12 (×8): 25 mg via ORAL
  Filled 2022-09-08 (×9): qty 1

## 2022-09-08 MED ORDER — HYDROCHLOROTHIAZIDE 12.5 MG PO TABS
12.5000 mg | ORAL_TABLET | Freq: Two times a day (BID) | ORAL | Status: DC
  Administered 2022-09-08: 12.5 mg via ORAL
  Filled 2022-09-08: qty 1

## 2022-09-08 MED ORDER — IOHEXOL 350 MG/ML SOLN
100.0000 mL | Freq: Once | INTRAVENOUS | Status: AC | PRN
  Administered 2022-09-08: 75 mL via INTRAVENOUS

## 2022-09-08 MED ORDER — PROCHLORPERAZINE MALEATE 10 MG PO TABS: 10.0000 mg | ORAL_TABLET | Freq: Four times a day (QID) | ORAL | Status: DC | PRN

## 2022-09-08 MED ORDER — ALBUTEROL SULFATE HFA 108 (90 BASE) MCG/ACT IN AERS: 2.0000 | INHALATION_SPRAY | Freq: Four times a day (QID) | RESPIRATORY_TRACT | Status: DC | PRN

## 2022-09-08 MED ORDER — LISINOPRIL-HYDROCHLOROTHIAZIDE 20-12.5 MG PO TABS: 1.0000 | ORAL_TABLET | Freq: Two times a day (BID) | ORAL | Status: DC

## 2022-09-08 MED ORDER — HEPARIN (PORCINE) 25000 UT/250ML-% IV SOLN
800.0000 [IU]/h | INTRAVENOUS | Status: DC
  Administered 2022-09-08: 800 [IU]/h via INTRAVENOUS
  Filled 2022-09-08: qty 250

## 2022-09-08 MED ORDER — LOPERAMIDE HCL 2 MG PO TABS: 2.0000 mg | ORAL_TABLET | Freq: Four times a day (QID) | ORAL | Status: DC | PRN

## 2022-09-08 MED ORDER — ACETAMINOPHEN 325 MG PO TABS: 650.0000 mg | ORAL_TABLET | ORAL | Status: DC | PRN

## 2022-09-08 MED ORDER — SODIUM CHLORIDE (PF) 0.9 % IJ SOLN
INTRAMUSCULAR | Status: AC
  Filled 2022-09-08: qty 50

## 2022-09-08 NOTE — ED Notes (Signed)
ED TO INPATIENT HANDOFF REPORT  ED Nurse Name and Phone #:  Rayvon Char Name/Age/Gender Christine Hurley 72 y.o. female Room/Bed: WA07/WA07  Code Status   Code Status: Full Code  Home/SNF/Other Home Patient oriented to: self, place, time, and situation Is this baseline? Yes   Triage Complete: Triage complete  Chief Complaint NSTEMI (non-ST elevated myocardial infarction) (HCC) [I21.4]  Triage Note Pt BIB EMS from home, c/o SOB and dizziness x 1 week. Per EMS pt's work of breathing increases with exertion. On arrival sats were 80% room. Placed on 3 liters, sats 96%. Being treated recent dx of liver cancer 1 month ago. Started eliquis at that time.   BP 140/100 P 120 CBG 147 20 LAC   Allergies Allergies  Allergen Reactions   Famotidine     "Made me tired and weak"    Level of Care/Admitting Diagnosis ED Disposition     ED Disposition  Admit   Condition  --   Comment  Hospital Area: [facility] [[id number]]  Level of Care: Telemetry [5]  Admit to tele based on following criteria: Monitor for Ischemic changes  May admit patient to [facility] or [name] if equivalent level of care is available:: Yes  Covid Evaluation: Asymptomatic - no recent exposure (last 10 days) testing not required  Diagnosis: NSTEMI (non-ST elevated myocardial infarction) [facility]) [[id number]]  Admitting Physician: Rometta Emery [[id number]]  Attending Physician: Rometta Emery [[id number]]  Certification:: I certify this patient will need inpatient services for at least 2 midnights  Estimated Length of Stay: 3          B Medical/Surgery History Past Medical History:  Diagnosis Date   Abnormal nuclear stress test February '13   Small, partially reversible inferolateral ischemia. Marked hypertensive response to exercise. Low risk.   Arthritis    Chest pain with minimal risk for cardiac etiology February 2013   Evaluated with echocardiogram and Myoview, do   COPD (chronic  obstructive pulmonary disease) (HCC)    Depression    Hepatitis C    Hypertension    Osteopenia of spine 05/22/2016   PAD (peripheral artery disease) (HCC) 10/01/2021   ABI: monophasic and decreased ABIs. Refer to vascular   Right tibial fracture     Past Surgical History:  Procedure Laterality Date   DOPPLER ECHOCARDIOGRAPHY  03/26/2011   LVEF>55% normal LV wall thickness, normal LA, mild aortic sclerosis with trace to mild AI, mild to moderate TR, RVSP of , RA pressure about 5 mmHg   NM MYOVIEW LTD  03/27/11   post stress ejection fraction is 72%, abnormal myocardial perfusion study, this is a low risk scan   TIBIA FRACTURE SURGERY Right 09/2008   fracture of right lower leg with open reduction and internal fixation     A IV Location/Drains/Wounds Patient Lines/Drains/Airways Status     Active Line/Drains/Airways     Name Placement date Placement time Site Days   Peripheral IV 09/08/22 20 G Anterior;Left;Proximal Forearm 09/08/22  1426  Forearm  less than 1            Intake/Output Last 24 hours  Intake/Output Summary (Last 24 hours) at 09/08/2022 2100 Last data filed at 09/08/2022 1851 Gross per 24 hour  Intake 1349.77 ml  Output --  Net 1349.77 ml    Labs/Imaging Results for orders placed or performed during the hospital encounter of 09/08/22 (from the past 48 hour(s))  Basic metabolic panel     Status: Abnormal  Collection Time: 09/08/22  1:34 PM  Result Value Ref Range   Sodium 136 135 - 145 mmol/L   Potassium 3.4 (L) 3.5 - 5.1 mmol/L   Chloride 106 98 - 111 mmol/L   CO2 20 (L) 22 - 32 mmol/L   Glucose, Bld 108 (H) 70 - 99 mg/dL    Comment: Glucose reference range applies only to samples taken after fasting for at least 8 hours.   BUN 22 8 - 23 mg/dL   Creatinine, Ser 8.65 0.44 - 1.00 mg/dL   Calcium 9.0 8.9 - 78.4 mg/dL   GFR, Estimated >69 >62 mL/min    Comment: (NOTE) Calculated using the CKD-EPI Creatinine Equation (2021)    Anion gap 10 5 -  15    Comment: Performed at [facility], [address] [place], [place] [address]  CBC with Differential     Status: None   Collection Time: 09/08/22  1:34 PM  Result Value Ref Range   WBC 7.3 4.0 - 10.5 K/uL   RBC 4.22 3.87 - 5.11 MIL/uL   Hemoglobin 12.3 12.0 - 15.0 g/dL   HCT 13.2 44.0 - 10.2 %   MCV 88.4 80.0 - 100.0 fL   MCH 29.1 26.0 - 34.0 pg   MCHC 33.0 30.0 - 36.0 g/dL   RDW 72.5 36.6 - 44.0 %   Platelets 289 150 - 400 K/uL   nRBC 0.0 0.0 - 0.2 %   Neutrophils Relative % 84 %   Neutro Abs 6.0 1.7 - 7.7 K/uL   Lymphocytes Relative 10 %   Lymphs Abs 0.8 0.7 - 4.0 K/uL   Monocytes Relative 5 %   Monocytes Absolute 0.4 0.1 - 1.0 K/uL   Eosinophils Relative 1 %   Eosinophils Absolute 0.1 0.0 - 0.5 K/uL   Basophils Relative 0 %   Basophils Absolute 0.0 0.0 - 0.1 K/uL   Immature Granulocytes 0 %   Abs Immature Granulocytes 0.02 0.00 - 0.07 K/uL    Comment: Performed at [facility], [address], [place], [place] [address]  Troponin I (High Sensitivity)     Status: Abnormal   Collection Time: 09/08/22  1:34 PM  Result Value Ref Range   Troponin I (High Sensitivity) 370 (HH) <18 ng/L    Comment: CRITICAL RESULT CALLED TO, READ BACK BY AND VERIFIED WITH KENT,Q. RN AT 1438 09/08/22 MULLINS,T (NOTE) Elevated high sensitivity troponin I (hsTnI) values and significant  changes across serial measurements may suggest ACS but many other  chronic and acute conditions are known to elevate hsTnI results.  Refer to the "Links" section for chest pain algorithms and additional  guidance. Performed at [facility], [address], [place], [place] [address]   SARS Coronavirus 2 by RT PCR (hospital order, performed in [facility] hospital lab) *cepheid single result test* Anterior Nasal Swab     Status: None   Collection Time: 09/08/22  1:34 PM   Specimen: Anterior Nasal Swab  Result Value Ref Range   SARS Coronavirus 2 by  RT PCR NEGATIVE NEGATIVE    Comment: (NOTE) SARS-CoV-2 target nucleic acids are NOT DETECTED.  The SARS-CoV-2 RNA is generally detectable in upper and lower respiratory specimens during the acute phase of infection. The lowest concentration of SARS-CoV-2 viral copies this assay can detect is 250 copies / mL. A negative result does not preclude SARS-CoV-2 infection and should not be used as the sole basis for treatment or other patient management decisions.  A negative result may occur with improper specimen collection / handling, submission of specimen other than nasopharyngeal swab, presence of viral mutation(s) within the areas targeted by this assay, and inadequate number of viral copies (<250 copies / mL). A negative result must be combined with clinical observations, patient history, and epidemiological information.  Fact Sheet for Patients:   RoadLapTop.co.za  Fact Sheet for Healthcare Providers: http://kim-miller.com/  This test is not yet approved or  cleared by the [place] FDA and has been authorized for detection and/or diagnosis of SARS-CoV-2 by FDA under an Emergency Use Authorization (EUA).  This EUA will remain in effect (meaning this test can be used) for the duration of the COVID-19 declaration under Section 564(b)(1) of the Act, 21 U.S.C. section 360bbb-3(b)(1), unless the authorization is terminated or revoked sooner.  Performed at [facility], [address] [place], [place] [address]   Brain natriuretic peptide     Status: None   Collection Time: 09/08/22  1:53 PM  Result Value Ref Range   B Natriuretic Peptide 91.1 0.0 - 100.0 pg/mL    Comment: Performed at [facility], [address], [place], [place] [address]  Troponin I (High Sensitivity)     Status: Abnormal   Collection Time: 09/08/22  5:28 PM  Result Value Ref Range   Troponin I (High Sensitivity) 414 (HH)  <18 ng/L    Comment: CRITICAL VALUE NOTED. VALUE IS CONSISTENT WITH PREVIOUSLY REPORTED/CALLED VALUE DELTA CHECK NOTED (NOTE) Elevated high sensitivity troponin I (hsTnI) values and significant  changes across serial measurements may suggest ACS but many other  chronic and acute conditions are known to elevate hsTnI results.  Refer to the "Links" section for chest pain algorithms and additional  guidance. Performed at [facility], [address] [place], [place] [address]   APTT     Status: None   Collection Time: 09/08/22  7:10 PM  Result Value Ref Range   aPTT 31 24 - 36 seconds    Comment: Performed at [facility], [address], [place], [place] [address]  Protime-INR     Status: Abnormal   Collection Time: 09/08/22  7:10 PM  Result Value Ref Range   Prothrombin Time 16.3 (H) 11.4 - 15.2 seconds   INR 1.3 (H) 0.8 - 1.2    Comment: (NOTE) INR goal varies based on device and disease states. Performed at [facility], [address], [place], [place] [address]   Heparin level (unfractionated)     Status: Abnormal   Collection Time: 09/08/22  7:10 PM  Result Value Ref Range   Heparin Unfractionated >1.10 (H) 0.30 - 0.70 IU/mL    Comment: (NOTE) The clinical reportable range upper limit is being lowered to >1.10 to align with the FDA approved guidance for the current laboratory assay.  If heparin results are below expected values, and patient dosage has  been confirmed, suggest follow up testing of antithrombin III levels. Performed at [facility], [address] [place], [place] [address]    CT Angio Chest PE W and/or Wo Contrast  Result Date: 09/08/2022 CLINICAL DATA:  Pulmonary embolus EXAM: CT ANGIOGRAPHY CHEST WITH CONTRAST TECHNIQUE: Multidetector CT imaging of the chest was performed using the standard protocol during bolus administration of intravenous contrast. Multiplanar CT image  reconstructions and MIPs were obtained to evaluate the vascular anatomy. RADIATION DOSE REDUCTION: This exam was performed according to the departmental dose-optimization program which includes  automated exposure control, adjustment of the mA and/or kV according to patient size and/or use of iterative reconstruction technique. CONTRAST:  75mL OMNIPAQUE IOHEXOL 350 MG/ML SOLN COMPARISON:  Chest CTA dated July 18, 2022; MRI abdomen dated July 19, 2018 FINDINGS: Cardiovascular: No evidence of pulmonary embolus, evaluation of the subsegmental pulmonary arteries is limited due to respiratory motion artifact. Normal heart size. Normal caliber thoracic aorta with severe atherosclerotic disease. Severe coronary artery calcifications. Mediastinum/Nodes: No enlarged lymph nodes seen in the chest. Esophagus and thyroid are unremarkable. Lungs/Pleura: Central airways are patent. Scattered lower lung linear opacities which are likely due to atelectasis. No pleural effusion or pneumothorax. Upper Abdomen: Partially visualized liver lesions, portal vein thrombus, and trace ascites, consistent with history of hepatocellular carcinoma. Unchanged left adrenal gland thickening. Musculoskeletal: No aggressive appearing osseous lesions. Review of the MIP images confirms the above findings. IMPRESSION: 1. No evidence of pulmonary embolus. 2. Bilateral atelectasis. 3. Redemonstrated liver lesions, portal vein thrombus, and trace ascites, consistent with history of hepatocellular carcinoma. 4. Aortic Atherosclerosis (ICD10-I70.0). Electronically Signed   By: Allegra Lai M.D.   On: 09/08/2022 16:51   DG Chest 2 View  Result Date: 09/08/2022 CLINICAL DATA:  Shortness of breath EXAM: CHEST - 2 VIEW COMPARISON:  04/23/2017 FINDINGS: The heart size and mediastinal contours are within normal limits. Dependent bibasilar scarring or atelectasis. Disc degenerative disease of the thoracic spine. IMPRESSION: Dependent bibasilar scarring or  atelectasis. Infection or aspiration not strictly excluded. Consider short interval follow-up radiographs. Electronically Signed   By: Jearld Lesch M.D.   On: 09/08/2022 15:32    Pending Labs Unresulted Labs (From admission, onward)     Start     Ordered   09/09/22 0600  APTT  Once-Timed,   TIMED        09/08/22 1923   09/09/22 0600  Heparin level (unfractionated)  Once-Timed,   TIMED        09/08/22 1923   Signed and Held  Lipid panel  Tomorrow morning,   R        Signed and Held            Vitals/Pain Today's Vitals   09/08/22 2000 09/08/22 2010 09/08/22 2015 09/08/22 2045  BP: 108/76   117/79  Pulse:   (!) 108 (!) 106  Resp: (!) 23  (!) 22 (!) 30  Temp:  98 F (36.7 C)    TempSrc:  Oral    SpO2:   92% 92%  PainSc:        Isolation Precautions No active isolations  Medications Medications  heparin ADULT infusion 100 units/mL (25000 units/254mL) (has no administration in time range)  sodium chloride 0.9 % bolus 1,000 mL (0 mLs Intravenous Stopped 09/08/22 1851)  iohexol (OMNIPAQUE) 350 MG/ML injection 100 mL (75 mLs Intravenous Contrast Given 09/08/22 1528)  cefTRIAXone (ROCEPHIN) 1 g in sodium chloride 0.9 % 100 mL IVPB (0 g Intravenous Stopped 09/08/22 1716)  azithromycin (ZITHROMAX) 500 mg in sodium chloride 0.9 % 250 mL IVPB (0 mg Intravenous Stopped 09/08/22 1851)  aspirin chewable tablet 324 mg (324 mg Oral Given 09/08/22 1819)  ondansetron (ZOFRAN) injection 4 mg (4 mg Intravenous Given 09/08/22 1820)  nitroGLYCERIN (NITROGLYN) 2 % ointment 1 inch (1 inch Topical Given 09/08/22 1907)    Mobility walks with device     Focused Assessments     R Recommendations: See Admitting Provider Note  Report given to:   Additional Notes:

## 2022-09-08 NOTE — ED Provider Notes (Addendum)
Care of patient assumed from Dr. Renaye Rakers.  This patient presents with shortness of breath, lightheadedness, and dizziness over the past 4 days.  She has a new oxygen requirement.  Her initial troponin was 370.  Although she is on anticoagulation, there is concern of PE.  She is currently awaiting second troponin as well as PE study.  Her last dose of Eliquis was this morning.  She will require admission. Physical Exam  BP 137/86 (BP Location: Left Arm)   Pulse (!) 113   Temp 97.8 F (36.6 C) (Oral)   Resp 20   SpO2 94%   Physical Exam Vitals and nursing note reviewed.  Constitutional:      General: She is not in acute distress.    Appearance: She is well-developed. She is not ill-appearing, toxic-appearing or diaphoretic.  HENT:     Head: Normocephalic and atraumatic.     Mouth/Throat:     Mouth: Mucous membranes are moist.  Eyes:     Extraocular Movements: Extraocular movements intact.     Conjunctiva/sclera: Conjunctivae normal.  Cardiovascular:     Rate and Rhythm: Normal rate and regular rhythm.     Heart sounds: No murmur heard. Pulmonary:     Effort: Pulmonary effort is normal. No tachypnea.  Abdominal:     Palpations: Abdomen is soft.     Tenderness: There is no abdominal tenderness.  Musculoskeletal:        General: No swelling. Normal range of motion.     Cervical back: Normal range of motion and neck supple.  Skin:    General: Skin is warm and dry.     Coloration: Skin is not cyanotic or pale.  Neurological:     General: No focal deficit present.     Mental Status: She is alert and oriented to person, place, and time.  Psychiatric:        Mood and Affect: Mood normal.        Behavior: Behavior normal.     Procedures  Procedures  ED Course / MDM   Clinical Course as of 09/08/22 1556  Mon Sep 08, 2022  1440 Troponin I (High Sensitivity)(!!): 370 [MT]  1440 Elevated troponin without active chest pain, with tachycardia and hypoxia - concerning for NSTEMI  secondary to potential thromboembolism or other secondary cause.  CT PE ordered.  Pt not requiring SL nitroglycerin at this time. [MT]  1453 Pt reassessed and updated (family present) - no CP, still having tachycardia, IV fluids ordered [MT]  1504 Patient is signed out to Dr. Gloris Manchester EDP pending follow up on CT imaging and repeat troponin level.  If minor NSTEMI is secondary to demand ischemia, anticipate admission to hospital and inpatient cardiology consultation.  If pt develops chest discomfort, ECG ischemic changes, or significant trop elevation, consider stat cardiology consult.  Patient took eliquis this morning - holding off on heparin until imaging is completed. [MT]    Clinical Course User Index [MT] Trifan, Kermit Balo, MD   Medical Decision Making Amount and/or Complexity of Data Reviewed Labs: ordered. Decision-making details documented in ED Course. Radiology: ordered. ECG/medicine tests: ordered.  Risk OTC drugs. Prescription drug management. Decision regarding hospitalization.   X-ray showed bibasilar opacities.  Patient was treated empirically for pneumonia.  CTA did not show evidence of PE.  On assessment, patient now is maintaining normal SpO2 on room air.  She does, however, states that she is now experiencing chest pressure and nausea.  ASA, Zofran, and repeat EKG were  ordered.  Heparin was ordered.  Per chart review, patient has not seen cardiology in the past 9 years.  She has never undergone heart cath.  EKG does not show any dynamic change from EKG earlier today.  When compared to EKGs from last month, it does appear that there are some subtle anterior changes.  Repeat troponin shows slight increase to 414.  On reassessment, patient's chest pressure and nausea have resolved.  I spoke with cardiologist on-call, Dr. Wyline Mood, who recommends medical management and admission to [facility].  Cardiology team will see her in the morning.  Patient was admitted for further  management.       Gloris Manchester, MD 09/08/22 Izell Dodge    Gloris Manchester, MD 09/08/22 [phone]

## 2022-09-08 NOTE — Progress Notes (Addendum)
ANTICOAGULATION CONSULT NOTE - Initial Consult  Pharmacy Consult for IV Heparin Indication: ACS/STEMI  No Known Allergies  Patient Measurements:   Heparin Dosing Weight: 64.9 kg  Vital Signs: Temp: 97.9 F (36.6 C) (07/29 1730) Temp Source: Oral (07/29 1730) BP: 152/89 (07/29 1730) Pulse Rate: 106 (07/29 1730)  Labs: Recent Labs    09/08/22 1334 09/08/22 1728  HGB 12.3  --   HCT 37.3  --   PLT 289  --   CREATININE 0.81  --   TROPONINIHS 370* 414*    Estimated Creatinine Clearance: 54.2 mL/min (by C-G formula based on SCr of 0.81 mg/dL).   Medical History: Past Medical History:  Diagnosis Date   Abnormal nuclear stress test February '13   Small, partially reversible inferolateral ischemia. Marked hypertensive response to exercise. Low risk.   Arthritis    Chest pain with minimal risk for cardiac etiology February 2013   Evaluated with echocardiogram and Myoview, do   COPD (chronic obstructive pulmonary disease) (HCC)    Depression    Hepatitis C    Hypertension    Osteopenia of spine 05/22/2016   PAD (peripheral artery disease) (HCC) 10/01/2021   ABI: monophasic and decreased ABIs. Refer to vascular   Right tibial fracture      Medications:  Prior to admission medications include apixaban 5 mg po BID -  - Last dose this morning on 7/29 at 10:00   Assessment: Pharmacy consulted to dose IV heparin for this 72 yo female c/o SOB, dizziness, and dyspnea on exertion.  History includes hepatocellular carcinoma and portal vein thrombosis.   - EKG: sinus tachycardia, probable left atrial enlargement, borderline T abnormalities, borderline ST elevation  - Troponins: elevated  - CT Angio Chest: no evidence of PE  Goal of Therapy:  Heparin level 0.3-0.7 units/ml Monitor platelets by anticoagulation protocol: Yes aPTT 66-102   Plan:  Baseline aPTT and Heparin level (HL) Begin IV heparin at 800 units/hr twelve hours after last apixaban dose at 22:00 tonight with no  bolus If the baseline HL is < 0.1, use only HLs to titrate heparin infusion If the baseline HL is elevated > 0.1 and does not correlate with aPTT, titrate heparin using only aPTTs Obtain 8 hr aPTT & HL  Monitor daily aPTT & heparin level until correlating then use HLs only, CBC, signs/symptoms of bleeding   Thank you for allowing pharmacy to be a part of this patient's care.  Selinda Eon, PharmD, BCPS Clinical Pharmacist [facility] 09/08/2022 6:55 PM

## 2022-09-08 NOTE — H&P (Signed)
History and Physical    Patient: Christine Hurley ZDG:[id number] DOB: 09-18-1950 DOA: 09/08/2022 DOS: the patient was seen and examined on 09/08/2022 PCP: Willow Ora, MD  Patient coming from: Home  Chief Complaint:  Chief Complaint  Patient presents with   Shortness of Breath   Dizziness   HPI: [name] is a 72 y.o. female with medical history significant of hepatocellular carcinoma who is currently on chemotherapy last chemo a month ago with another session coming this Thursday, essential hypertension, hepatitis C, peripheral arterial disease, COPD, depression, who presents to the ER with 3 days of weakness, shortness of breath and exertional.  Patient was oriented oncology if she does have status injection.  Was doing better but today she felt lightheaded and almost passed out.  She also had some light flashes and was worried so she came to the ER where she was seen and evaluated.  Patient was found to have markedly elevated troponins.  Initial was 317 and subsequent 414.  EKG hide did not show any significant change.  Cardiology consulted in the diagnosis of non-ST elevation MI was made.  Recommended to admit the patient for medical management and cardiology to follow.  Patient is therefore being admitted to the hospital.  She denied any chest pain now.  She feels much better.  Review of Systems: As mentioned in the history of present illness. All other systems reviewed and are negative. Past Medical History:  Diagnosis Date   Abnormal nuclear stress test February '13   Small, partially reversible inferolateral ischemia. Marked hypertensive response to exercise. Low risk.   Arthritis    Chest pain with minimal risk for cardiac etiology February 2013   Evaluated with echocardiogram and Myoview, do   COPD (chronic obstructive pulmonary disease) (HCC)    Depression    Hepatitis C    Hypertension    Osteopenia of spine 05/22/2016   PAD (peripheral artery disease) (HCC) 10/01/2021    ABI: monophasic and decreased ABIs. Refer to vascular   Right tibial fracture     Past Surgical History:  Procedure Laterality Date   DOPPLER ECHOCARDIOGRAPHY  03/26/2011   LVEF>55% normal LV wall thickness, normal LA, mild aortic sclerosis with trace to mild AI, mild to moderate TR, RVSP of , RA pressure about 5 mmHg   NM MYOVIEW LTD  03/27/11   post stress ejection fraction is 72%, abnormal myocardial perfusion study, this is a low risk scan   TIBIA FRACTURE SURGERY Right 09/2008   fracture of right lower leg with open reduction and internal fixation   Social History:  reports that she has been smoking cigarettes. She has a 11.3 pack-year smoking history. She has never used smokeless tobacco. She reports current alcohol use of about 2.0 standard drinks of alcohol per week. She reports that she does not currently use drugs after having used the following drugs: Marijuana.  Allergies  Allergen Reactions   Famotidine     "Made me tired and weak"    Family History  Problem Relation Age of Onset   Asthma Mother    Cancer Mother        Brain   Cancer - Prostate Father    Cancer Maternal Grandmother        GYN cancer    Prior to Admission medications   Medication Sig Start Date End Date Taking? Authorizing Provider  albuterol (VENTOLIN HFA) 108 (90 Base) MCG/ACT inhaler INHALE 1 TO 2 PUFFS INTO THE LUNGS EVERY 6 HOURS AS NEEDED  FOR WHEEZING OR SHORTNESS OF BREATH Patient taking differently: Inhale 2 puffs into the lungs every 6 (six) hours as needed for wheezing or shortness of breath. 07/10/20  Yes Willow Ora, MD  amLODipine (NORVASC) 10 MG tablet Take 1 tablet (10 mg total) by mouth daily. 07/28/22  Yes Willow Ora, MD  apixaban (ELIQUIS) 5 MG TABS tablet Take 1 tablet (5mg ) by mouth twice daily Patient taking differently: Take 5 mg by mouth in the morning and at bedtime. 07/21/22  Yes Ghimire, Werner Lean, MD  atorvastatin (LIPITOR) 10 MG tablet TAKE 1 TABLET(10 MG) BY  MOUTH AT BEDTIME Patient taking differently: Take 10 mg by mouth at bedtime. 10/15/21  Yes Willow Ora, MD  cilostazol (PLETAL) 100 MG tablet Take 100 mg by mouth 2 (two) times daily.   Yes [provider]  hydrocortisone 1 % ointment Apply 1 Application topically 2 (two) times daily. Patient taking differently: Apply 1 Application topically See admin instructions. Apply to affected areas 2 times a day 09/04/22  Yes Walisiewicz, Kaitlyn E, PA-C  IMODIUM A-D 2 MG tablet Take 2 mg by mouth 4 (four) times daily as needed for diarrhea or loose stools.   Yes [provider]  lisinopril-hydrochlorothiazide (ZESTORETIC) 20-12.5 MG tablet Take 2 tablets by mouth daily. Patient taking differently: Take 1 tablet by mouth 2 (two) times daily. 09/19/21  Yes Willow Ora, MD  SPIRIVA RESPIMAT 2.5 MCG/ACT AERS INHALE 2 PUFFS INTO THE LUNGS DAILY Patient taking differently: Inhale 1-2 puffs into the lungs daily as needed (for shortness of breath). 02/19/22  Yes Willow Ora, MD  APIXABAN Everlene Balls) VTE STARTER PACK (10MG  AND 5MG ) Take 2 tablets (10 mg) twice daily x 1 week, after 1 week switch to 1 tablet (5 mg) twice daily Patient not taking: Reported on 09/08/2022 07/21/22   Maretta Bees, MD  dronabinol (MARINOL) 2.5 MG capsule Take 1 capsule (2.5 mg total) by mouth 2 (two) times daily before a meal. Patient not taking: Reported on 09/08/2022 08/18/22   Malachy Mood, MD  famotidine (PEPCID) 20 MG tablet Take 1 tablet (20 mg total) by mouth 2 (two) times daily for 5 days. Patient not taking: Reported on 09/08/2022 09/05/22 09/10/22  Shanon Ace, PA-C  prochlorperazine (COMPAZINE) 10 MG tablet Take 1 tablet (10 mg total) by mouth every 6 (six) hours as needed for nausea or vomiting. Patient not taking: Reported on 09/08/2022 07/24/22   Malachy Mood, MD    Physical Exam: Vitals:   09/08/22 1715 09/08/22 1730 09/08/22 1900 09/08/22 1915  BP: (!) 130/93 (!) 152/89 121/74 114/84  Pulse:  99 (!) 106 99   Resp: (!) 31 (!) 23 (!) 23 (!) 22  Temp:  97.9 F (36.6 C)    TempSrc:  Oral    SpO2: 94% 90% 92%    Constitutional: Chronically ill looking, NAD, calm, comfortable Eyes: PERRL, lids and conjunctivae normal ENMT: Mucous membranes are moist. Posterior pharynx clear of any exudate or lesions.Normal dentition.  Neck: normal, supple, no masses, no thyromegaly Respiratory: clear to auscultation bilaterally, no wheezing, no crackles. Normal respiratory effort. No accessory muscle use.  Cardiovascular: Regular rate and rhythm, no murmurs / rubs / gallops. No extremity edema. 2+ pedal pulses. No carotid bruits.  Abdomen: no tenderness, no masses palpated. No hepatosplenomegaly. Bowel sounds positive.  Musculoskeletal: Good range of motion, no joint swelling or tenderness, Skin: no rashes, lesions, ulcers. No induration Neurologic: CN 2-12 grossly intact. Sensation intact, DTR normal.  Strength 5/5 in all 4.  Psychiatric: Normal judgment and insight. Alert and oriented x 3. Normal mood  Data Reviewed:  Blood pressure 130/93, potassium 3.4, CO2 20 glucose 108.  Troponin 370 and then 414.  EKG shows sinus tachycardia with a rate of 101, nonspecific ST changes and mild abnormalities.   Assessment and Plan:  #1 non-ST elevation MI: Patient will be admitted for medical management.  Initiate heparin drip.  Aspirin, statin.  Will follow recommendations from cardiology.  #2 hepatocellular carcinoma: Patient is undergoing chemotherapy.  Defer to oncology.  #3 essential hypertension: Blood pressure is controlled.  Continue to monitor  #4 chronic otitis C: Continue with home regimen.  #5 hypokalemia: Continue to replete potassium.  #6 peripheral arterial disease: Stable.  Continue home regimen  #7 mixed hyperlipidemia: Continue statin  #8 tobacco abuse: Counseling with nicotine patch    Advance Care Planning:   Code Status: Prior full code  Consults: Dr. Dina Rich,  cardiologist  Family Communication: No family at bedside  Severity of Illness: The appropriate patient status for this patient is INPATIENT. Inpatient status is judged to be reasonable and necessary in order to provide the required intensity of service to ensure the patient's safety. The patient's presenting symptoms, physical exam findings, and initial radiographic and laboratory data in the context of their chronic comorbidities is felt to place them at high risk for further clinical deterioration. Furthermore, it is not anticipated that the patient will be medically stable for discharge from the hospital within 2 midnights of admission.   * I certify that at the point of admission it is my clinical judgment that the patient will require inpatient hospital care spanning beyond 2 midnights from the point of admission due to high intensity of service, high risk for further deterioration and high frequency of surveillance required.*  AuthorLonia Blood, MD 09/08/2022 7:30 PM  For on call review [email or web address].

## 2022-09-08 NOTE — ED Triage Notes (Addendum)
Pt BIB EMS from home, c/o SOB and dizziness x 1 week. Per EMS pt's work of breathing increases with exertion. On arrival sats were 80% room. Placed on 3 liters, sats 96%. Being treated recent dx of liver cancer 1 month ago. Started eliquis at that time.   BP 140/100 P 120 CBG 147 20 LAC

## 2022-09-08 NOTE — ED Provider Notes (Signed)
EMERGENCY DEPARTMENT AT [facility] Provider Note   CSN: [id number] Arrival date & time: 09/08/22  1259     History  Chief Complaint  Patient presents with   Shortness of Breath   Dizziness    Christine Hurley is a 72 y.o. female with history of hepatocellular carcinoma, oncology fusions, presented to the ED with shortness of breath and dyspnea on exertion.  Patient ports onset of symptoms about 4 days ago.  She went to the oncology center where she was given some steroids reports she felt better afterwards but noted continued shortness of breath, dyspnea on exertion, lightheadedness with activity.  She denies fevers or coughs.  She is not normally on oxygen.  She does have a history of portal vein thrombosis for which she has been on Eliquis for 1 month  Oncology note 09/04/22 from clinic noting the patient was having pruritus that time was felt to be secondary to her infusion treatment, she was prescribed Pepcid, Benadryl, given steroids in the clinic.  She also was noted to have normal hemoglobin and platelet levels.  HPI     Home Medications Prior to Admission medications   Medication Sig Start Date End Date Taking? Authorizing Provider  albuterol (VENTOLIN HFA) 108 (90 Base) MCG/ACT inhaler INHALE 1 TO 2 PUFFS INTO THE LUNGS EVERY 6 HOURS AS NEEDED FOR WHEEZING OR SHORTNESS OF BREATH Patient taking differently: Inhale 2 puffs into the lungs every 6 (six) hours as needed for wheezing or shortness of breath. 07/10/20   Willow Ora, MD  amLODipine (NORVASC) 10 MG tablet Take 1 tablet (10 mg total) by mouth daily. 07/28/22   Willow Ora, MD  apixaban (ELIQUIS) 5 MG TABS tablet Take 1 tablet (5mg ) by mouth twice daily 07/21/22   Ghimire, Werner Lean, MD  APIXABAN Everlene Balls) VTE STARTER PACK (10MG  AND 5MG ) Take 2 tablets (10 mg) twice daily x 1 week, after 1 week switch to 1 tablet (5 mg) twice daily 07/21/22   Ghimire, Werner Lean, MD  atorvastatin (LIPITOR) 10 MG  tablet TAKE 1 TABLET(10 MG) BY MOUTH AT BEDTIME Patient taking differently: Take 10 mg by mouth daily. 10/15/21   Willow Ora, MD  dronabinol (MARINOL) 2.5 MG capsule Take 1 capsule (2.5 mg total) by mouth 2 (two) times daily before a meal. 08/18/22   Malachy Mood, MD  famotidine (PEPCID) 20 MG tablet Take 1 tablet (20 mg total) by mouth 2 (two) times daily for 5 days. 09/05/22 09/10/22  Namon Cirri E, PA-C  hydrocortisone 1 % ointment Apply 1 Application topically 2 (two) times daily. 09/04/22   Shanon Ace, PA-C  lisinopril-hydrochlorothiazide (ZESTORETIC) 20-12.5 MG tablet Take 2 tablets by mouth daily. 09/19/21   Willow Ora, MD  prochlorperazine (COMPAZINE) 10 MG tablet Take 1 tablet (10 mg total) by mouth every 6 (six) hours as needed for nausea or vomiting. 07/24/22   Malachy Mood, MD  SPIRIVA RESPIMAT 2.5 MCG/ACT AERS INHALE 2 PUFFS INTO THE LUNGS DAILY 02/19/22   Willow Ora, MD      Allergies    Patient has no known allergies.    Review of Systems   Review of Systems  Physical Exam Updated Vital Signs BP 137/86 (BP Location: Left Arm)   Pulse (!) 113   Temp 97.8 F (36.6 C) (Oral)   Resp 20   SpO2 94%  Physical Exam Constitutional:      General: She is not in acute distress. HENT:  Head: Normocephalic and atraumatic.  Eyes:     Conjunctiva/sclera: Conjunctivae normal.     Pupils: Pupils are equal, round, and reactive to light.  Cardiovascular:     Rate and Rhythm: Normal rate and regular rhythm.  Pulmonary:     Effort: Pulmonary effort is normal. No respiratory distress.     Comments: 3L [place], rhonchi RLL Abdominal:     General: There is no distension.     Tenderness: There is no abdominal tenderness.  Skin:    General: Skin is warm and dry.  Neurological:     General: No focal deficit present.     Mental Status: She is alert. Mental status is at baseline.  Psychiatric:        Mood and Affect: Mood normal.        Behavior: Behavior normal.      ED Results / Procedures / Treatments   Labs (all labs ordered are listed, but only abnormal results are displayed) Labs Reviewed  BASIC METABOLIC PANEL - Abnormal; Notable for the following components:      Result Value   Potassium 3.4 (*)    CO2 20 (*)    Glucose, Bld 108 (*)    All other components within normal limits  TROPONIN I (HIGH SENSITIVITY) - Abnormal; Notable for the following components:   Troponin I (High Sensitivity) 370 (*)    All other components within normal limits  SARS CORONAVIRUS 2 BY RT PCR  CBC WITH DIFFERENTIAL/PLATELET  BRAIN NATRIURETIC PEPTIDE  TROPONIN I (HIGH SENSITIVITY)    EKG EKG Interpretation Date/Time:  Monday September 08 2022 13:12:14 EDT Ventricular Rate:  113 PR Interval:  133 QRS Duration:  60 QT Interval:  296 QTC Calculation: 406 R Axis:   23  Text Interpretation: Sinus tachycardia Probable left atrial enlargement Borderline repolarization abnormality Confirmed by Alvester Chou [phone]) on 09/08/2022 2:39:45 PM  Radiology No results found.  Procedures Procedures    Medications Ordered in ED Medications  sodium chloride 0.9 % bolus 1,000 mL (has no administration in time range)    ED Course/ Medical Decision Making/ A&P Clinical Course as of 09/08/22 1506  Mon Sep 08, 2022  1440 Troponin I (High Sensitivity)(!!): 370 [MT]  1440 Elevated troponin without active chest pain, with tachycardia and hypoxia - concerning for NSTEMI secondary to potential thromboembolism or other secondary cause.  CT PE ordered.  Pt not requiring SL nitroglycerin at this time. [MT]  1453 Pt reassessed and updated (family present) - no CP, still having tachycardia, IV fluids ordered [MT]  1504 Patient is signed out to Dr. Gloris Manchester EDP pending follow up on CT imaging and repeat troponin level.  If minor NSTEMI is secondary to demand ischemia, anticipate admission to hospital and inpatient cardiology consultation.  If pt develops chest discomfort,  ECG ischemic changes, or significant trop elevation, consider stat cardiology consult.  Patient took eliquis this morning - holding off on heparin until imaging is completed. [MT]    Clinical Course User Index [MT] Mayia Megill, Kermit Balo, MD                             Medical Decision Making Amount and/or Complexity of Data Reviewed Labs: ordered. Decision-making details documented in ED Course. Radiology: ordered. ECG/medicine tests: ordered.   This patient presents to the ED with concern for shortness of breath. This involves an extensive number of treatment options, and is a complaint that carries with  it a high risk of complications and morbidity.  The differential diagnosis includes bacterial pneumonia versus viral URI versus pleural effusion versus anemia versus PE versus other  Co-morbidities that complicate the patient evaluation: History malignancy and prior thrombosis at high risk of thromboembolic event  External records from outside source obtained and reviewed including oncology office evaluation 4 days ago  I ordered and personally interpreted labs.  The pertinent results include:  hgb wnl, wbc wnl, Cr 0.81, Trop 370, Covid negative.  Repeat trop and BNP are pending.  I ordered imaging studies including x-ray of the chest, CTPE which were pending at the time of signout.   The patient was maintained on a cardiac monitor.  I personally viewed and interpreted the cardiac monitored which showed an underlying rhythm of: Sinus tachycardia  Per my interpretation the patient's ECG shows sinus tachycardia with no acute ischemic findings  I ordered medication including normal saline for tachycardia  I have reviewed the patients home medicines and have made adjustments as needed  After the interventions noted above, I reevaluated the patient and found that they have: stayed the same  Dispostion:  Signed out, see ed course         Final Clinical Impression(s) / ED  Diagnoses Final diagnoses:  Shortness of breath  Tachycardia  NSTEMI (non-ST elevated myocardial infarction) [facility])    Rx / DC Orders ED Discharge Orders     None         Terald Sleeper, MD 09/08/22 1507

## 2022-09-09 ENCOUNTER — Other Ambulatory Visit: Payer: Self-pay

## 2022-09-09 ENCOUNTER — Inpatient Hospital Stay (HOSPITAL_COMMUNITY): Payer: Medicare HMO

## 2022-09-09 DIAGNOSIS — R079 Chest pain, unspecified: Secondary | ICD-10-CM

## 2022-09-09 DIAGNOSIS — I10 Essential (primary) hypertension: Secondary | ICD-10-CM

## 2022-09-09 DIAGNOSIS — B1920 Unspecified viral hepatitis C without hepatic coma: Secondary | ICD-10-CM | POA: Diagnosis not present

## 2022-09-09 DIAGNOSIS — I214 Non-ST elevation (NSTEMI) myocardial infarction: Secondary | ICD-10-CM | POA: Diagnosis not present

## 2022-09-09 DIAGNOSIS — B182 Chronic viral hepatitis C: Secondary | ICD-10-CM | POA: Diagnosis not present

## 2022-09-09 DIAGNOSIS — E782 Mixed hyperlipidemia: Secondary | ICD-10-CM

## 2022-09-09 DIAGNOSIS — R000 Tachycardia, unspecified: Secondary | ICD-10-CM

## 2022-09-09 DIAGNOSIS — R768 Other specified abnormal immunological findings in serum: Secondary | ICD-10-CM

## 2022-09-09 DIAGNOSIS — C220 Liver cell carcinoma: Secondary | ICD-10-CM

## 2022-09-09 DIAGNOSIS — R197 Diarrhea, unspecified: Secondary | ICD-10-CM | POA: Diagnosis not present

## 2022-09-09 DIAGNOSIS — I81 Portal vein thrombosis: Secondary | ICD-10-CM

## 2022-09-09 DIAGNOSIS — I739 Peripheral vascular disease, unspecified: Secondary | ICD-10-CM

## 2022-09-09 LAB — COMPREHENSIVE METABOLIC PANEL
ALT: 233 U/L — ABNORMAL HIGH (ref 0–44)
AST: 261 U/L — ABNORMAL HIGH (ref 15–41)
Albumin: 3 g/dL — ABNORMAL LOW (ref 3.5–5.0)
Alkaline Phosphatase: 220 U/L — ABNORMAL HIGH (ref 38–126)
Anion gap: 9 (ref 5–15)
BUN: 24 mg/dL — ABNORMAL HIGH (ref 8–23)
CO2: 18 mmol/L — ABNORMAL LOW (ref 22–32)
Calcium: 8.4 mg/dL — ABNORMAL LOW (ref 8.9–10.3)
Chloride: 108 mmol/L (ref 98–111)
Creatinine, Ser: 0.86 mg/dL (ref 0.44–1.00)
GFR, Estimated: >60 mL/min (ref >60)
Glucose, Bld: 109 mg/dL — ABNORMAL HIGH (ref 70–99)
Potassium: 3.3 mmol/L — ABNORMAL LOW (ref 3.5–5.1)
Sodium: 135 mmol/L (ref 135–145)
Total Bilirubin: 0.4 mg/dL (ref 0.3–1.2)
Total Protein: 6.4 g/dL — ABNORMAL LOW (ref 6.5–8.1)

## 2022-09-09 LAB — APTT
aPTT: >200 seconds (ref 24–36)
aPTT: 44 seconds — ABNORMAL HIGH (ref 24–36)

## 2022-09-09 LAB — MAGNESIUM: Magnesium: 1.9 mg/dL (ref 1.7–2.4)

## 2022-09-09 LAB — C DIFFICILE QUICK SCREEN W PCR REFLEX
C Diff antigen: POSITIVE — AB
C Diff toxin: NEGATIVE

## 2022-09-09 LAB — CBC
HCT: 32.7 % — ABNORMAL LOW (ref 36.0–46.0)
Hemoglobin: 10.8 g/dL — ABNORMAL LOW (ref 12.0–15.0)
MCH: 29.3 pg (ref 26.0–34.0)
MCHC: 33 g/dL (ref 30.0–36.0)
MCV: 88.9 fL (ref 80.0–100.0)
Platelets: 280 10*3/uL (ref 150–400)
RBC: 3.68 MIL/uL — ABNORMAL LOW (ref 3.87–5.11)
RDW: 15.4 % (ref 11.5–15.5)
WBC: 6.7 10*3/uL (ref 4.0–10.5)
nRBC: 0 % (ref 0.0–0.2)

## 2022-09-09 LAB — ECHOCARDIOGRAM COMPLETE
Area-P 1/2: 2.48 cm2
Height: 66 in
S' Lateral: 2.87 cm
Weight: 2275.15 oz

## 2022-09-09 LAB — TROPONIN I (HIGH SENSITIVITY): Troponin I (High Sensitivity): 483 ng/L (ref <18)

## 2022-09-09 MED ORDER — HEPARIN (PORCINE) 25000 UT/250ML-% IV SOLN
1150.0000 [IU]/h | INTRAVENOUS | Status: DC
  Administered 2022-09-09: 800 [IU]/h via INTRAVENOUS

## 2022-09-09 MED ORDER — ATORVASTATIN CALCIUM 20 MG PO TABS
20.0000 mg | ORAL_TABLET | Freq: Every day | ORAL | Status: DC
  Administered 2022-09-09 – 2022-09-11 (×3): 20 mg via ORAL
  Filled 2022-09-09 (×3): qty 1

## 2022-09-09 MED ORDER — ASPIRIN 81 MG PO CHEW
81.0000 mg | CHEWABLE_TABLET | ORAL | Status: AC
  Administered 2022-09-10: 81 mg via ORAL
  Filled 2022-09-09: qty 1

## 2022-09-09 MED ORDER — MORPHINE SULFATE (PF) 2 MG/ML IV SOLN
INTRAVENOUS | Status: AC
  Filled 2022-09-09: qty 1

## 2022-09-09 MED ORDER — MORPHINE SULFATE (PF) 2 MG/ML IV SOLN
1.0000 mg | Freq: Once | INTRAVENOUS | Status: AC
  Administered 2022-09-09: 1 mg via INTRAVENOUS

## 2022-09-09 MED ORDER — SODIUM CHLORIDE 0.9 % IV SOLN: INTRAVENOUS | Status: DC

## 2022-09-09 MED ORDER — POTASSIUM CHLORIDE CRYS ER 20 MEQ PO TBCR
40.0000 meq | EXTENDED_RELEASE_TABLET | Freq: Once | ORAL | Status: AC
  Administered 2022-09-09: 40 meq via ORAL
  Filled 2022-09-09: qty 2

## 2022-09-09 NOTE — H&P (View-Only) (Signed)
Cardiology Consultation   Patient ID: Christine Hurley MRN: [id number]; DOB: 10/29/1950  Admit date: 09/08/2022 Date of Consult: 09/09/2022  PCP:  Willow Ora, MD   [facility] Providers Cardiologist:  Chilton Si, MD   Patient Profile:   Christine Hurley is a 72 y.o. female with a hx of aortic valve sclerosis, hypertension, smoking history, COPD, PAD, hepatitis C, and hepatocellular carcinoma who is being seen 09/09/2022 for the evaluation of chest pain and abnormal EKG at the request of Dr. Pola Corn.  History of Present Illness:   [name] underwent nuclear stress test in 2015 that was nonischemic.  Echocardiogram at that time with a preserved EF mild aortic sclerosis mild to moderate TR.  She was referred back to cardiology in 2015 for dyspnea on exertion by pulmonology.  Repeat echocardiogram 2017 continue to show preserved EF and grade 1 DD.  Recently lower extremity duplex 09/23/2021 showed moderate bilateral lower extremity arterial disease, she was referred to [facility].  I do not see that that happened.  She was hospitalized 07/2022 for colitis and CT imaging showed multiple liver masses, portal venous thrombosis, colitis, high-grade stenosis/occlusion of the proximal superficial femoral arteries bilaterally, and left adrenal nodule.  Liver MRI showed extensive tumor identified throughout both lobes of the liver.  Further workup was diagnostic for multifocal hepatocellular carcinoma, she is following with Dr. [name].  She has known transaminitis.  She is currently receiving chemotherapy treatment: remelimumab-actl C1 D1 + Durvalumab q28d.  She has been on Eliquis for approximately 1 month for portal vein thrombosis.   She was seen by oncology 09/04/2022 with pruritus felt secondary to her infusion treatment and was given Pepcid, Benadryl, and steroids.  She presented to [facility] ED 09/08/2022 with shortness of breath and dizziness x 1 week.   She presented to the infusion center  for shortness of breath and DOE and was treated with steroids but continued to have trouble breathing and presented to the ER.  She was hypoxic with O2 80% on RA requiring O2. She was tachycardic at 120 bpm with BP  140/100.   HS troponin elevated at 370 -->  CTA negative for PE Eliquis was continued  She subsequently developed chest pressure relieved with IV morphine. EKG showed ST elevation in V1/V2 and a septal infarct pattern.  Case was discussed with on-call STEMI MD Dr. [name] who did not feel acute lab activation was necessary given that the patient was now chest pain-free. She was admitted with urgent cardiology consult this morning. On my exam, she describes chest pressure that is worsened when she becomes dyspneic with activity and relieved with inhalers. She had a bout of chest pain this morning relieved with ellipta just before I entered the room. This occurred after going to the bathroom.  She is wearing a NTG ointment, which she does not think helped her chest pain. As described, she had chest pain relieved with inhaler with NTG in place. However, RR note 0252 this morning describes chest pressure relieved with IV morphine.  She was active prior to June 2024, walking dogs, house chores, etc. She has never had exertional or resting chest pain.  CTA showed severe coronary artery calcifications as well as aortic atherosclerosis.  Patient is somewhat difficult historian.   Past Medical History:  Diagnosis Date   Abnormal nuclear stress test February '13   Small, partially reversible inferolateral ischemia. Marked hypertensive response to exercise. Low risk.   Arthritis    Chest pain with minimal risk  for cardiac etiology February 2013   Evaluated with echocardiogram and Myoview, do   COPD (chronic obstructive pulmonary disease) (HCC)    Depression    Hepatitis C    Hypertension    Osteopenia of spine 05/22/2016   PAD (peripheral artery disease) (HCC) 10/01/2021   ABI: monophasic  and decreased ABIs. Refer to vascular   Right tibial fracture      Past Surgical History:  Procedure Laterality Date   DOPPLER ECHOCARDIOGRAPHY  03/26/2011   LVEF>55% normal LV wall thickness, normal LA, mild aortic sclerosis with trace to mild AI, mild to moderate TR, RVSP of , RA pressure about 5 mmHg   NM MYOVIEW LTD  03/27/11   post stress ejection fraction is 72%, abnormal myocardial perfusion study, this is a low risk scan   TIBIA FRACTURE SURGERY Right 09/2008   fracture of right lower leg with open reduction and internal fixation     Home Medications:  Prior to Admission medications   Medication Sig Start Date End Date Taking? Authorizing Provider  albuterol (VENTOLIN HFA) 108 (90 Base) MCG/ACT inhaler INHALE 1 TO 2 PUFFS INTO THE LUNGS EVERY 6 HOURS AS NEEDED FOR WHEEZING OR SHORTNESS OF BREATH Patient taking differently: Inhale 2 puffs into the lungs every 6 (six) hours as needed for wheezing or shortness of breath. 07/10/20  Yes Willow Ora, MD  amLODipine (NORVASC) 10 MG tablet Take 1 tablet (10 mg total) by mouth daily. 07/28/22  Yes Willow Ora, MD  apixaban (ELIQUIS) 5 MG TABS tablet Take 1 tablet (5mg ) by mouth twice daily Patient taking differently: Take 5 mg by mouth in the morning and at bedtime. 07/21/22  Yes Ghimire, Werner Lean, MD  atorvastatin (LIPITOR) 10 MG tablet TAKE 1 TABLET(10 MG) BY MOUTH AT BEDTIME Patient taking differently: Take 10 mg by mouth at bedtime. 10/15/21  Yes Willow Ora, MD  cilostazol (PLETAL) 100 MG tablet Take 100 mg by mouth 2 (two) times daily.   Yes [provider]  hydrocortisone 1 % ointment Apply 1 Application topically 2 (two) times daily. Patient taking differently: Apply 1 Application topically See admin instructions. Apply to affected areas 2 times a day 09/04/22  Yes Walisiewicz, Kaitlyn E, PA-C  IMODIUM A-D 2 MG tablet Take 2 mg by mouth 4 (four) times daily as needed for diarrhea or loose stools.   Yes [provider]  Iron-Vitamins (GERITOL COMPLETE) TABS Take 1 tablet by mouth daily with breakfast.   Yes [provider]  lisinopril-hydrochlorothiazide (ZESTORETIC) 20-12.5 MG tablet Take 2 tablets by mouth daily. Patient taking differently: Take 1 tablet by mouth 2 (two) times daily. 09/19/21  Yes Willow Ora, MD  SPIRIVA RESPIMAT 2.5 MCG/ACT AERS INHALE 2 PUFFS INTO THE LUNGS DAILY Patient taking differently: Inhale 1-2 puffs into the lungs daily as needed (for shortness of breath). 02/19/22  Yes Willow Ora, MD  APIXABAN Everlene Balls) VTE STARTER PACK (10MG  AND 5MG ) Take 2 tablets (10 mg) twice daily x 1 week, after 1 week switch to 1 tablet (5 mg) twice daily Patient not taking: Reported on 09/08/2022 07/21/22   Maretta Bees, MD  dronabinol (MARINOL) 2.5 MG capsule Take 1 capsule (2.5 mg total) by mouth 2 (two) times daily before a meal. Patient not taking: Reported on 09/08/2022 08/18/22   Malachy Mood, MD  famotidine (PEPCID) 20 MG tablet Take 1 tablet (20 mg total) by mouth 2 (two) times daily for 5 days. Patient not taking: Reported on  09/08/2022 09/05/22 09/10/22  Shanon Ace, PA-C  prochlorperazine (COMPAZINE) 10 MG tablet Take 1 tablet (10 mg total) by mouth every 6 (six) hours as needed for nausea or vomiting. Patient not taking: Reported on 09/08/2022 07/24/22   Malachy Mood, MD    Inpatient Medications: Scheduled Meds:  atorvastatin  10 mg Oral QHS   cilostazol  100 mg Oral BID   dronabinol  2.5 mg Oral BID AC   famotidine  20 mg Oral BID   metoprolol tartrate  25 mg Oral BID   umeclidinium bromide  1 puff Inhalation Daily   Continuous Infusions:  heparin     PRN Meds: acetaminophen, albuterol, loperamide, ondansetron (ZOFRAN) IV, prochlorperazine  Allergies:    Allergies  Allergen Reactions   Famotidine     "Made me tired and weak"    Social History:   Social History   Socioeconomic History   Marital status: Divorced    Spouse name: Not on file    Number of children: 1   Years of education: Not on file   Highest education level: Not on file  Occupational History   Not on file  Tobacco Use   Smoking status: Every Day    Current packs/day: 0.25    Average packs/day: 0.3 packs/day for 45.0 years (11.3 ttl pk-yrs)    Types: Cigarettes   Smokeless tobacco: Never   Tobacco comments:    2 cigs per day   Vaping Use   Vaping status: Never Used  Substance and Sexual Activity   Alcohol use: Yes    Alcohol/week: 2.0 standard drinks of alcohol    Types: 2 Cans of beer per week    Comment: socially    Drug use: Not Currently    Types: Marijuana    Comment: 2 days ago    Sexual activity: Yes    Birth control/protection: Post-menopausal  Other Topics Concern   Not on file  Social History Narrative   Not on file   Social Determinants of Health   Financial Resource Strain: Low Risk  (03/13/2022)   Overall Financial Resource Strain (CARDIA)    Difficulty of Paying Living Expenses: Not hard at all  Food Insecurity: No Food Insecurity (09/08/2022)   Hunger Vital Sign    Worried About Running Out of Food in the Last Year: Never true    Ran Out of Food in the Last Year: Never true  Transportation Needs: No Transportation Needs (09/08/2022)   PRAPARE - Administrator, Civil Service (Medical): No    Lack of Transportation (Non-Medical): No  Physical Activity: Inactive (03/13/2022)   Exercise Vital Sign    Days of Exercise per Week: 0 days    Minutes of Exercise per Session: 0 min  Stress: No Stress Concern Present (03/13/2022)   [employer] of Occupational Health - Occupational Stress Questionnaire    Feeling of Stress : Not at all  Social Connections: Moderately Isolated (03/13/2022)   Social Connection and Isolation Panel [NHANES]    Frequency of Communication with Friends and Family: More than three times a week    Frequency of Social Gatherings with Friends and Family: More than three times a week    Attends  Religious Services: More than 4 times per year    Active Member of [employer] or Organizations: No    Attends Banker Meetings: Never    Marital Status: Divorced  Catering manager Violence: Not At Risk (09/08/2022)   Humiliation, Afraid, Rape, and Kick  questionnaire    Fear of Current or Ex-Partner: No    Emotionally Abused: No    Physically Abused: No    Sexually Abused: No    Family History:    Family History  Problem Relation Age of Onset   Asthma Mother    Cancer Mother        Brain   Cancer - Prostate Father    Cancer Maternal Grandmother        GYN cancer     ROS:  Please see the history of present illness.   All other ROS reviewed and negative.     Physical Exam/Data:   Vitals:   09/09/22 0540 09/09/22 0843 09/09/22 0906 09/09/22 0919  BP:   100/68 106/76  Pulse:   86 86  Resp:   20 18  Temp:   98 F (36.7 C)   TempSrc:   Oral   SpO2:  99% 99% 96%  Weight: 64.5 kg     Height: 5\' 6"  (1.676 m)       Intake/Output Summary (Last 24 hours) at 09/09/2022 0927 Last data filed at 09/09/2022 0422 Gross per 24 hour  Intake 1742.43 ml  Output --  Net 1742.43 ml      09/09/2022    5:40 AM 09/04/2022    9:36 AM 08/15/2022   10:14 AM  Last 3 Weights  Weight (lbs) 142 lb 3.2 oz 143 lb 143 lb 1.6 oz  Weight (kg) 64.5 kg 64.864 kg 64.91 kg     Body mass index is 22.95 kg/m.  General:  Well nourished, well developed, in no acute distress HEENT: normal Neck: no JVD Vascular: No carotid bruits; Distal pulses 2+ bilaterally Cardiac:  normal S1, S2; RRR; no murmur  Lungs:  clear to auscultation bilaterally, no wheezing, rhonchi or rales  Abd: soft, nontender, no hepatomegaly  Ext: no edema Musculoskeletal:  No deformities, BUE and BLE strength normal and equal Skin: warm and dry  Neuro:  CNs 2-12 intact, no focal abnormalities noted Psych:  Normal affect   EKG:  The EKG was personally reviewed and demonstrates:  sinus rhythm with HR 74, STE  V1-V2 Telemetry:  Telemetry was personally reviewed and demonstrates:  sinus to sinus tachycardia with HR 90-100s with PVCs  Relevant CV Studies:  Echo 11/2015: Study Conclusions   - Left ventricle: The cavity size was normal. Wall thickness was    increased in a pattern of mild LVH. Systolic function was normal.    The estimated ejection fraction was in the range of 60% to 65%.    Wall motion was normal; there were no regional wall motion    abnormalities. Doppler parameters are consistent with abnormal    left ventricular relaxation (grade 1 diastolic dysfunction).   Laboratory Data:  High Sensitivity Troponin:   Recent Labs  Lab 09/08/22 1334 09/08/22 1728 09/08/22 2057 09/08/22 2140 09/09/22 0215  TROPONINIHS 370* 414* 467* 472* 483*     Chemistry Recent Labs  Lab 09/04/22 0955 09/08/22 1334 09/09/22 0215  NA 137 136 135  K 3.9 3.4* 3.3*  CL 106 106 108  CO2 23 20* 18*  GLUCOSE 95 108* 109*  BUN 18 22 24*  CREATININE 0.71 0.81 0.86  CALCIUM 9.5 9.0 8.4*  MG  --   --  1.9  GFRNONAA >60 >60 >60  ANIONGAP 8 10 9     Recent Labs  Lab 09/04/22 0955 09/09/22 0215  PROT 7.4 6.4*  ALBUMIN 3.9 3.0*  AST 216* 261*  ALT 220* 233*  ALKPHOS 323* 220*  BILITOT 0.5 0.4   Lipids  Recent Labs  Lab 09/09/22 0215  CHOL 149  TRIG 71  HDL 52  LDLCALC 83  CHOLHDL 2.9    Hematology Recent Labs  Lab 09/04/22 0955 09/08/22 1334 09/09/22 0215  WBC 6.1 7.3 6.7  RBC 4.10 4.22 3.68*  HGB 12.3 12.3 10.8*  HCT 35.5* 37.3 32.7*  MCV 86.6 88.4 88.9  MCH 30.0 29.1 29.3  MCHC 34.6 33.0 33.0  RDW 14.9 15.3 15.4  PLT 284 289 280   Thyroid No results for input(s): "TSH", "FREET4" in the last 168 hours.  BNP Recent Labs  Lab 09/08/22 1353  BNP 91.1    DDimer No results for input(s): "DDIMER" in the last 168 hours.   Radiology/Studies:  CT Angio Chest PE W and/or Wo Contrast  Result Date: 09/08/2022 CLINICAL DATA:  Pulmonary embolus EXAM: CT ANGIOGRAPHY CHEST  WITH CONTRAST TECHNIQUE: Multidetector CT imaging of the chest was performed using the standard protocol during bolus administration of intravenous contrast. Multiplanar CT image reconstructions and MIPs were obtained to evaluate the vascular anatomy. RADIATION DOSE REDUCTION: This exam was performed according to the departmental dose-optimization program which includes automated exposure control, adjustment of the mA and/or kV according to patient size and/or use of iterative reconstruction technique. CONTRAST:  75mL OMNIPAQUE IOHEXOL 350 MG/ML SOLN COMPARISON:  Chest CTA dated July 18, 2022; MRI abdomen dated July 19, 2018 FINDINGS: Cardiovascular: No evidence of pulmonary embolus, evaluation of the subsegmental pulmonary arteries is limited due to respiratory motion artifact. Normal heart size. Normal caliber thoracic aorta with severe atherosclerotic disease. Severe coronary artery calcifications. Mediastinum/Nodes: No enlarged lymph nodes seen in the chest. Esophagus and thyroid are unremarkable. Lungs/Pleura: Central airways are patent. Scattered lower lung linear opacities which are likely due to atelectasis. No pleural effusion or pneumothorax. Upper Abdomen: Partially visualized liver lesions, portal vein thrombus, and trace ascites, consistent with history of hepatocellular carcinoma. Unchanged left adrenal gland thickening. Musculoskeletal: No aggressive appearing osseous lesions. Review of the MIP images confirms the above findings. IMPRESSION: 1. No evidence of pulmonary embolus. 2. Bilateral atelectasis. 3. Redemonstrated liver lesions, portal vein thrombus, and trace ascites, consistent with history of hepatocellular carcinoma. 4. Aortic Atherosclerosis (ICD10-I70.0). Electronically Signed   By: Allegra Lai M.D.   On: 09/08/2022 16:51   DG Chest 2 View  Result Date: 09/08/2022 CLINICAL DATA:  Shortness of breath EXAM: CHEST - 2 VIEW COMPARISON:  04/23/2017 FINDINGS: The heart size and  mediastinal contours are within normal limits. Dependent bibasilar scarring or atelectasis. Disc degenerative disease of the thoracic spine. IMPRESSION: Dependent bibasilar scarring or atelectasis. Infection or aspiration not strictly excluded. Consider short interval follow-up radiographs. Electronically Signed   By: Jearld Lesch M.D.   On: 09/08/2022 15:32     Assessment and Plan:   Chest pain NSTEMI - HS troponin 370 --> 414 --> 467 --> 472 --> 483 - EKG with ST elevation V1-V2 - repeat EKG pending, potassium has been replaced - received 324 mg ASA - NTG paste in place-  experienced chest pain with NTG that started as DOE when going to the bathroom, relieved with inhaler, but also CP relieved with IV morphine overnight - somewhat unclear order of events from patient - CTA does show severe coronary calcification - will obtain echo - difficult situation given recently diagnosed portal vein thrombus on eliquis treatment - if echo abnormal, would recommend definitive angiography, but timing will need to be  considered given her thrombus has only been treated for about 1 month with eliquis - Last dose of eliquis was last night at 2227 (09/08/22) - heparin gtt per pharmacy   Hypertension - PTA 10 mg amlodipine, lisinopril-hydrochlorothiazide 20-12.5 mg - last received ACEI-hydrochlorothiazide last night, now discontinued - metoprolol 25 mg BID started - BP controlled   PAD - on pletal - B LE duplex with bilateral LE disease - CTA C/A/P with bilateral stenosis/occlusion of proximal superficial femoral arteries   Hypokalemia K 3.3 - replace per primary   Hyperlipidemia with LDL goal < 70 09/09/2022: Cholesterol 149; HDL 52; LDL Cholesterol 83; Triglycerides 71; VLDL 14 Given elevated LFTs, will not titrate statin - on 10 mg lipitor   Portal vein thrombus - on eliquis x 1 month - eliquis now held, on heparin gtt   Hepatocellular carcinoma Transaminitis  Hepatitis C -  receiving chemotherapy - not a candidate for resection or liver transplant   Anemia Hb 10.8 (12.3 yesterday) No signs of active bleeding     Risk Assessment/Risk Scores:     TIMI Risk Score for Unstable Angina or Non-ST Elevation MI:   The patient's TIMI risk score is 5, which indicates a 26% risk of all cause mortality, new or recurrent myocardial infarction or need for urgent revascularization in the next 14 days.          For questions or updates, please contact [facility] Please consult [email or web address] for contact info under    Signed, Marcelino Duster, PA  09/09/2022 9:27 AM

## 2022-09-09 NOTE — Progress Notes (Signed)
   I came on call this AM for [facility] and this patient was listed as consult on our card added last night. Overnight notes reflect recurrent chest pain and morphine given with immediate relief of pain. Troponins 400 range. EKG shows early ST elevation in leads V1-V2, septal infarct pattern. This appears slightly progressed from prior EKG. Per d/w nurse at [facility] patient remains pain free. EKG was repeated and appears similar to overnight tracing. D/w Dr. [name], interventionalist, who did not feel we need acute lab activation given pain free but our [facility] team will see in consult this AM. I changed diet order for NPO for now. Patient got 324mg  ASA yesterday PM as well as Eliquis last night, but is now on heparin per pharmacy. Overnight NP and day shift MD also added to secure chat. Nurse aware to notify for any recurrent pain Laurann Montana, PA-C

## 2022-09-09 NOTE — Progress Notes (Signed)
PROGRESS NOTE  Christine Hurley  DOB: [birth year]  PCP: Willow Ora, MD LKG:[id number]  DOA: 09/08/2022  LOS: 1 day  Hospital Day: 2  Brief narrative: Collyns Burczyk is a 72 y.o. female with PMH significant for HCC currently on chemotherapy, HTN, PAD, COPD, hep C, depression 7/5, received last chemotherapy.  Follows up with Dr. [name] 7/1, patient presented to the ED from home with complaint of progressively worsening shortness of breath, dizziness for a week, almost passed out on the day of presentation.  Patient also reported that she is having diarrhea for last 1 month.  In the ED, patient was afebrile, heart rate 113, blood pressure stable, breathing on room air Initial troponin was elevated to 414 it is trended with further draws CBC unremarkable, BMP with potassium low at 3.4 CT angio chest did not show evidence of pulm embolism, but showed bibasilar atelectasis.  ED physician started the patient on IV antibiotics for potential pneumonia After few hours in the ED, patient started complaining of chest pressure and nausea.  She was started on aspirin, and Zofran.   She was started on heparin drip EDP discussed with cardiologist on-call Dr. Wyline Mood who recommended medical management and no immediate need of transfer to [facility]. Admitted to [facility] for  Subjective Patient was seen and examined this morning.  Pleasant elderly African-American female. Overnight, patient had another brief episode of chest pain which resolved with 1 dose of IV morphine. Vital signs stable. Repeat labs this morning showed WBC 6.7, hemoglobin 10.8, potassium 3.3 elevated LFTs Patient also reports that she has been having diarrhea for last 1 month and patient dizziness on sitting up.  Assessment and plan: NSTEMI Presented with shortness of breath, has intermittent episode of chest pressure in the hospital Troponin trend as below, last reading was 483 this morning EKG suspicious for ST elevation in V1 and  V2. Was initiated on heparin drip 1 dose of aspirin was given in the ED Cardiology consult appreciated.  Tentative plan of LHC tomorrow. Recent Labs    09/08/22 1334 09/08/22 1728 09/08/22 2057 09/08/22 2140 09/09/22 0215  TROPONINIHS 370* 414* 467* 472* 483*   Elevated liver enzymes Chronic hep C History of HCC LFT trend as below including elevated alk phos Follows up with oncologist Dr. [name]. Last chemo 7/5. Recent Labs  Lab 09/04/22 0955 09/08/22 1334 09/08/22 1910 09/09/22 0215  AST 216*  --   --  261*  ALT 220*  --   --  233*  ALKPHOS 323*  --   --  220*  BILITOT 0.5  --   --  0.4  PROT 7.4  --   --  6.4*  ALBUMIN 3.9  --   --  3.0*  INR  --   --  1.3*  --   PLT 284 289  --  280   Diarrhea Reports ongoing for last 1 month.  Probably related to chemo.  Obtain CT panel Start Imodium  Postural dizziness H/o HTN Reports dizziness on sitting up today.  Probably postural/orthostatic hypotension. PTA meds-amlodipine 10 mg daily, lisinopril 20 mg daily, HCTZ 12.5 mg daily It seems metoprolol 25 mg twice daily was ordered because of ACS. At this time, I would hold other blood pressure medicines and monitor. Continue IV fluid. Monitor orthostatic vitals.  PAD, HLD PTA meds-Eliquis 5 mg twice daily, Lipitor 10 mg daily, cilostazol 100 mg twice daily. Currently on heparin drip. Continue cilostazol. Lipitor on hold because of elevated LFTs.  Hypokalemia Potassium level  low as below.  Replacement given Recent Labs  Lab 09/04/22 0955 09/08/22 1334 09/09/22 0215  K 3.9 3.4* 3.3*  MG  --   --  1.9   Chronic everyday smoker Counseled to quit.  Nicotine patch offered  Bilateral atelectasis Probably because of low activity since chemotherapy.  Encourage ambulation.  Incentive spirometry, flutter valve. No fever, no leukocytosis. With suspicion of pneumonia, 1 dose of antibiotic was given in the ED.  Will monitor off antibiotics at this time. Recent Labs  Lab  09/04/22 0955 09/08/22 1334 09/09/22 0215  WBC 6.1 7.3 6.7   Mobility: PT eval  Goals of care   Code Status: Full Code    DVT prophylaxis: Heparin drip    Antimicrobials: No antibiotics Fluid: NS at 50 mill per hour Consultants: Cardiology Family Communication: None at bedside  Status: Inpatient Level of care:  Telemetry   Patient from: Home Anticipated d/c to: Pending clinical course Needs to continue in-hospital care:  Pending LHC tomorrow       Diet:  Diet Order             Diet NPO time specified  Diet effective midnight           Diet Heart Room service appropriate? Yes; Fluid consistency: Thin  Diet effective now                   Scheduled Meds:  [START ON 09/10/2022] aspirin  81 mg Oral Pre-Cath   atorvastatin  20 mg Oral QHS   cilostazol  100 mg Oral BID   dronabinol  2.5 mg Oral BID AC   famotidine  20 mg Oral BID   metoprolol tartrate  25 mg Oral BID   umeclidinium bromide  1 puff Inhalation Daily    PRN meds: acetaminophen, albuterol, loperamide, ondansetron (ZOFRAN) IV, prochlorperazine   Infusions:   sodium chloride 50 mL/hr at 09/09/22 1339   heparin 800 Units/hr (09/09/22 1003)    Antimicrobials: Anti-infectives (From admission, onward)    Start     Dose/Rate Route Frequency Ordered Stop   09/08/22 1615  cefTRIAXone (ROCEPHIN) 1 g in sodium chloride 0.9 % 100 mL IVPB        1 g 200 mL/hr over 30 Minutes Intravenous  Once 09/08/22 1603 09/08/22 1716   09/08/22 1615  azithromycin (ZITHROMAX) 500 mg in sodium chloride 0.9 % 250 mL IVPB        500 mg 250 mL/hr over 60 Minutes Intravenous  Once 09/08/22 1603 09/08/22 1851       Nutritional status:  Body mass index is 22.95 kg/m.          Objective: Vitals:   09/09/22 1004 09/09/22 1247  BP: 110/78 118/81  Pulse: 88 80  Resp:  20  Temp:  98.1 F (36.7 C)  SpO2:  97%    Intake/Output Summary (Last 24 hours) at 09/09/2022 1353 Last data filed at 09/09/2022  0422 Gross per 24 hour  Intake 1742.43 ml  Output --  Net 1742.43 ml   Filed Weights   09/09/22 0540  Weight: 64.5 kg   Weight change:  Body mass index is 22.95 kg/m.   Physical Exam: General exam: Pleasant, elderly African-American female.  Not in distress Skin: No rashes, lesions or ulcers. HEENT: Atraumatic, normocephalic, no obvious bleeding Lungs: Clear to auscultation bilaterally CVS: Regular rate and rhythm, no murmur GI/Abd soft, nontender, nondistended, bowel sound present CNS: Alert, awake, oriented x 3 Psychiatry: Mood appropriate Extremities: No  pedal edema, no calf tenderness  Data Review: I have personally reviewed the laboratory data and studies available.  F/u labs ordered Unresulted Labs (From admission, onward)     Start     Ordered   09/10/22 0500  Basic metabolic panel  Tomorrow morning,   R        09/09/22 1316   09/10/22 0500  CBC  Tomorrow morning,   R        09/09/22 1316   09/10/22 0500  Protime-INR  Tomorrow morning,   R        09/09/22 1316   09/09/22 1800  APTT  Once-Timed,   TIMED        09/09/22 0202   09/09/22 1242  C. Diff by PCR, Reflexed  Once,   TIMED        09/09/22 1242   09/09/22 0230  CBC  Daily,   R      09/09/22 0205            Total time spent in review of labs and imaging, patient evaluation, formulation of plan, documentation and communication with family: 55 minutes  Signed, Lorin Glass, MD [facility] 09/09/2022

## 2022-09-09 NOTE — Progress Notes (Signed)
ANTICOAGULATION CONSULT NOTE  Pharmacy Consult for IV Heparin Indication: ACS/STEMI  Allergies  Allergen Reactions   Famotidine     "Made me tired and weak"    Patient Measurements: Height: 5\' 6"  (167.6 cm) Weight: 64.2 kg (141 lb 8.6 oz) IBW/kg (Calculated) : 59.3 Heparin Dosing Weight: 64.9 kg  Vital Signs: Temp: 98.1 F (36.7 C) (07/30 1247) Temp Source: Oral (07/30 1247) BP: 127/79 (07/30 2045) Pulse Rate: 80 (07/30 2045)  Labs: Recent Labs    09/08/22 1334 09/08/22 1728 09/08/22 1910 09/08/22 2057 09/08/22 2140 09/09/22 0215 09/09/22 1818 09/09/22 2132  HGB 12.3  --   --   --   --  10.8*  --   --   HCT 37.3  --   --   --   --  32.7*  --   --   PLT 289  --   --   --   --  280  --   --   APTT  --   --  31  --   --   --  >200* 44*  LABPROT  --   --  16.3*  --   --   --   --   --   INR  --   --  1.3*  --   --   --   --   --   HEPARINUNFRC  --   --  >1.10*  --   --   --   --   --   CREATININE 0.81  --   --   --   --  0.86  --   --   TROPONINIHS 370*   < >  --  467* 472* 483*  --   --    < > = values in this interval not displayed.    Estimated Creatinine Clearance: 55.4 mL/min (by C-G formula based on SCr of 0.86 mg/dL).   Medical History: Past Medical History:  Diagnosis Date   Abnormal nuclear stress test February '13   Small, partially reversible inferolateral ischemia. Marked hypertensive response to exercise. Low risk.   Arthritis    Chest pain with minimal risk for cardiac etiology February 2013   Evaluated with echocardiogram and Myoview, do   COPD (chronic obstructive pulmonary disease) (HCC)    Depression    Hepatitis C    Hypertension    Osteopenia of spine 05/22/2016   PAD (peripheral artery disease) (HCC) 10/01/2021   ABI: monophasic and decreased ABIs. Refer to vascular   Right tibial fracture      Assessment: Pharmacy consulted to dose IV heparin for this 72 yo female c/o SOB, dizziness, and dyspnea on exertion.  History includes  hepatocellular carcinoma and portal vein thrombosis.   - EKG: sinus tachycardia, probable left atrial enlargement, borderline T abnormalities, borderline ST elevation  - Troponins: elevated  - CT Angio Chest: no evidence of PE  -Patient received dose of apixaban at 2227 on 7/29  09/09/2022: Initial aPTT 44 sec- subtherapeutic on IV heparin 800 units/hr This was re-draw as 1st attempt (aPTT >200sec) was obtained from same arm as heparin infusing CBC: Hg 10.8, pltc 483 No bleeding or infusion related concerns reported by RN  Goal of Therapy:  Heparin level 0.3-0.7 units/ml Monitor platelets by anticoagulation protocol: Yes aPTT 66-102 sec   Plan:  Increase IV heparin to 950 units/hr  Recheck aPTT 8h after rate change Monitor daily aPTT & heparin level until correlating then use HLs only, CBC, signs/symptoms of  bleeding   Thank you for allowing pharmacy to be a part of this patient's care.  Junita Push, PharmD, BCPS 09/09/2022 10:21 PM

## 2022-09-09 NOTE — Progress Notes (Signed)
ANTICOAGULATION CONSULT NOTE - Initial Consult  Pharmacy Consult for IV Heparin Indication: ACS/STEMI  Allergies  Allergen Reactions   Famotidine     "Made me tired and weak"    Patient Measurements:   Heparin Dosing Weight: 64.9 kg  Vital Signs: Temp: 98 F (36.7 C) (07/30 0113) Temp Source: Oral (07/30 0113) BP: 133/57 (07/30 0113) Pulse Rate: 43 (07/30 0113)  Labs: Recent Labs    09/08/22 1334 09/08/22 1728 09/08/22 1910 09/08/22 2057 09/08/22 2140  HGB 12.3  --   --   --   --   HCT 37.3  --   --   --   --   PLT 289  --   --   --   --   APTT  --   --  31  --   --   LABPROT  --   --  16.3*  --   --   INR  --   --  1.3*  --   --   HEPARINUNFRC  --   --  >1.10*  --   --   CREATININE 0.81  --   --   --   --   TROPONINIHS 370* 414*  --  467* 472*    Estimated Creatinine Clearance: 54.2 mL/min (by C-G formula based on SCr of 0.81 mg/dL).   Medical History: Past Medical History:  Diagnosis Date   Abnormal nuclear stress test February '13   Small, partially reversible inferolateral ischemia. Marked hypertensive response to exercise. Low risk.   Arthritis    Chest pain with minimal risk for cardiac etiology February 2013   Evaluated with echocardiogram and Myoview, do   COPD (chronic obstructive pulmonary disease) (HCC)    Depression    Hepatitis C    Hypertension    Osteopenia of spine 05/22/2016   PAD (peripheral artery disease) (HCC) 10/01/2021   ABI: monophasic and decreased ABIs. Refer to vascular   Right tibial fracture      Medications:  Prior to admission medications include apixaban 5 mg po BID -  - Last dose this morning on 7/29 at 10:00   Assessment: Pharmacy consulted to dose IV heparin for this 72 yo female c/o SOB, dizziness, and dyspnea on exertion.  History includes hepatocellular carcinoma and portal vein thrombosis.   - EKG: sinus tachycardia, probable left atrial enlargement, borderline T abnormalities, borderline ST elevation  -  Troponins: elevated  - CT Angio Chest: no evidence of PE  -Patient received dose of apixaban at 2227 on 7/29  Goal of Therapy:  Heparin level 0.3-0.7 units/ml Monitor platelets by anticoagulation protocol: Yes aPTT 66-102   Plan:  Stop IV heparin.  Resume at 800 units/hr twelve hours after last apixaban dose at 10:00 tonight with no bolus Titrate heparin using aPTT until heparin level correlates with aptt Obtain 8 hr aPTT & HL  Monitor daily aPTT & heparin level until correlating then use HLs only, CBC, signs/symptoms of bleeding   Thank you for allowing pharmacy to be a part of this patient's care.  Junita Push, PharmD, BCPS 09/09/2022 2:08 AM

## 2022-09-09 NOTE — Significant Event (Signed)
Rapid Response Event Note   Reason for Call :  Change in status, chest pressure  Initial Focused Assessment:  EKG reading STEMI with ST elevation, patient complaints of chest pressure and SOB. Primary nurse started patient on 3L of O2 and paged on-call. Rapid response RN and on-call provider arrived at bedside. Provider ordered morning labs and morphine. Repeat EKG done at bedside.   5 minutes after morphine was given patient felt relief, no longer experiencing chest pressure at this time.   Interventions:  Morning labs, morphine, EKG   Event Summary:   MD Notified: Chinita Greenland, NP Call Time: [phone] Arrival Time: 0151 End Time: 0230  Odette Horns, RN

## 2022-09-09 NOTE — Plan of Care (Signed)
  Problem: Education: Goal: Understanding of cardiac disease, CV risk reduction, and recovery process will improve Outcome: Progressing   Problem: Activity: Goal: Ability to tolerate increased activity will improve Outcome: Progressing   Problem: Cardiac: Goal: Ability to achieve and maintain adequate cardiovascular perfusion will improve Outcome: Progressing   Problem: Clinical Measurements: Goal: Ability to maintain clinical measurements within normal limits will improve Outcome: Progressing   Problem: Safety: Goal: Ability to remain free from injury will improve Outcome: Progressing

## 2022-09-09 NOTE — Consult Note (Signed)
Cardiology Consultation   Patient ID: Christine Hurley MRN: [id number]; DOB: 10/29/1950  Admit date: 09/08/2022 Date of Consult: 09/09/2022  PCP:  Willow Ora, MD   [facility] Providers Cardiologist:  Chilton Si, MD   Patient Profile:   Christine Hurley is a 72 y.o. female with a hx of aortic valve sclerosis, hypertension, smoking history, COPD, PAD, hepatitis C, and hepatocellular carcinoma who is being seen 09/09/2022 for the evaluation of chest pain and abnormal EKG at the request of Dr. Pola Corn.  History of Present Illness:   Christine Hurley underwent nuclear stress test in 2015 that was nonischemic.  Echocardiogram at that time with a preserved EF mild aortic sclerosis mild to moderate TR.  She was referred back to cardiology in 2015 for dyspnea on exertion by pulmonology.  Repeat echocardiogram 2017 continue to show preserved EF and grade 1 DD.  Recently lower extremity duplex 09/23/2021 showed moderate bilateral lower extremity arterial disease, she was referred to [facility].  I do not see that that happened.  She was hospitalized 07/2022 for colitis and CT imaging showed multiple liver masses, portal venous thrombosis, colitis, high-grade stenosis/occlusion of the proximal superficial femoral arteries bilaterally, and left adrenal nodule.  Liver MRI showed extensive tumor identified throughout both lobes of the liver.  Further workup was diagnostic for multifocal hepatocellular carcinoma, she is following with Dr. [name].  She has known transaminitis.  She is currently receiving chemotherapy treatment: remelimumab-actl C1 D1 + Durvalumab q28d.  She has been on Eliquis for approximately 1 month for portal vein thrombosis.   She was seen by oncology 09/04/2022 with pruritus felt secondary to her infusion treatment and was given Pepcid, Benadryl, and steroids.  She presented to [facility] ED 09/08/2022 with shortness of breath and dizziness x 1 week.   She presented to the infusion center  for shortness of breath and DOE and was treated with steroids but continued to have trouble breathing and presented to the ER.  She was hypoxic with O2 80% on RA requiring O2. She was tachycardic at 120 bpm with BP  140/100.   HS troponin elevated at 370 -->  CTA negative for PE Eliquis was continued  She subsequently developed chest pressure relieved with IV morphine. EKG showed ST elevation in V1/V2 and a septal infarct pattern.  Case was discussed with on-call STEMI MD Dr. [name] who did not feel acute lab activation was necessary given that the patient was now chest pain-free. She was admitted with urgent cardiology consult this morning. On my exam, she describes chest pressure that is worsened when she becomes dyspneic with activity and relieved with inhalers. She had a bout of chest pain this morning relieved with ellipta just before I entered the room. This occurred after going to the bathroom.  She is wearing a NTG ointment, which she does not think helped her chest pain. As described, she had chest pain relieved with inhaler with NTG in place. However, RR note 0252 this morning describes chest pressure relieved with IV morphine.  She was active prior to June 2024, walking dogs, house chores, etc. She has never had exertional or resting chest pain.  CTA showed severe coronary artery calcifications as well as aortic atherosclerosis.  Patient is somewhat difficult historian.   Past Medical History:  Diagnosis Date   Abnormal nuclear stress test February '13   Small, partially reversible inferolateral ischemia. Marked hypertensive response to exercise. Low risk.   Arthritis    Chest pain with minimal risk  for cardiac etiology February 2013   Evaluated with echocardiogram and Myoview, do   COPD (chronic obstructive pulmonary disease) (HCC)    Depression    Hepatitis C    Hypertension    Osteopenia of spine 05/22/2016   PAD (peripheral artery disease) (HCC) 10/01/2021   ABI: monophasic  and decreased ABIs. Refer to vascular   Right tibial fracture      Past Surgical History:  Procedure Laterality Date   DOPPLER ECHOCARDIOGRAPHY  03/26/2011   LVEF>55% normal LV wall thickness, normal LA, mild aortic sclerosis with trace to mild AI, mild to moderate TR, RVSP of , RA pressure about 5 mmHg   NM MYOVIEW LTD  03/27/11   post stress ejection fraction is 72%, abnormal myocardial perfusion study, this is a low risk scan   TIBIA FRACTURE SURGERY Right 09/2008   fracture of right lower leg with open reduction and internal fixation     Home Medications:  Prior to Admission medications   Medication Sig Start Date End Date Taking? Authorizing Provider  albuterol (VENTOLIN HFA) 108 (90 Base) MCG/ACT inhaler INHALE 1 TO 2 PUFFS INTO THE LUNGS EVERY 6 HOURS AS NEEDED FOR WHEEZING OR SHORTNESS OF BREATH Patient taking differently: Inhale 2 puffs into the lungs every 6 (six) hours as needed for wheezing or shortness of breath. 07/10/20  Yes Willow Ora, MD  amLODipine (NORVASC) 10 MG tablet Take 1 tablet (10 mg total) by mouth daily. 07/28/22  Yes Willow Ora, MD  apixaban (ELIQUIS) 5 MG TABS tablet Take 1 tablet (5mg ) by mouth twice daily Patient taking differently: Take 5 mg by mouth in the morning and at bedtime. 07/21/22  Yes Ghimire, Werner Lean, MD  atorvastatin (LIPITOR) 10 MG tablet TAKE 1 TABLET(10 MG) BY MOUTH AT BEDTIME Patient taking differently: Take 10 mg by mouth at bedtime. 10/15/21  Yes Willow Ora, MD  cilostazol (PLETAL) 100 MG tablet Take 100 mg by mouth 2 (two) times daily.   Yes [provider]  hydrocortisone 1 % ointment Apply 1 Application topically 2 (two) times daily. Patient taking differently: Apply 1 Application topically See admin instructions. Apply to affected areas 2 times a day 09/04/22  Yes Walisiewicz, Kaitlyn E, PA-C  IMODIUM A-D 2 MG tablet Take 2 mg by mouth 4 (four) times daily as needed for diarrhea or loose stools.   Yes [provider]  Iron-Vitamins (GERITOL COMPLETE) TABS Take 1 tablet by mouth daily with breakfast.   Yes [provider]  lisinopril-hydrochlorothiazide (ZESTORETIC) 20-12.5 MG tablet Take 2 tablets by mouth daily. Patient taking differently: Take 1 tablet by mouth 2 (two) times daily. 09/19/21  Yes Willow Ora, MD  SPIRIVA RESPIMAT 2.5 MCG/ACT AERS INHALE 2 PUFFS INTO THE LUNGS DAILY Patient taking differently: Inhale 1-2 puffs into the lungs daily as needed (for shortness of breath). 02/19/22  Yes Willow Ora, MD  APIXABAN Everlene Balls) VTE STARTER PACK (10MG  AND 5MG ) Take 2 tablets (10 mg) twice daily x 1 week, after 1 week switch to 1 tablet (5 mg) twice daily Patient not taking: Reported on 09/08/2022 07/21/22   Maretta Bees, MD  dronabinol (MARINOL) 2.5 MG capsule Take 1 capsule (2.5 mg total) by mouth 2 (two) times daily before a meal. Patient not taking: Reported on 09/08/2022 08/18/22   Malachy Mood, MD  famotidine (PEPCID) 20 MG tablet Take 1 tablet (20 mg total) by mouth 2 (two) times daily for 5 days. Patient not taking: Reported on  09/08/2022 09/05/22 09/10/22  Shanon Ace, PA-C  prochlorperazine (COMPAZINE) 10 MG tablet Take 1 tablet (10 mg total) by mouth every 6 (six) hours as needed for nausea or vomiting. Patient not taking: Reported on 09/08/2022 07/24/22   Malachy Mood, MD    Inpatient Medications: Scheduled Meds:  atorvastatin  10 mg Oral QHS   cilostazol  100 mg Oral BID   dronabinol  2.5 mg Oral BID AC   famotidine  20 mg Oral BID   metoprolol tartrate  25 mg Oral BID   umeclidinium bromide  1 puff Inhalation Daily   Continuous Infusions:  heparin     PRN Meds: acetaminophen, albuterol, loperamide, ondansetron (ZOFRAN) IV, prochlorperazine  Allergies:    Allergies  Allergen Reactions   Famotidine     "Made me tired and weak"    Social History:   Social History   Socioeconomic History   Marital status: Divorced    Spouse name: Not on file    Number of children: 1   Years of education: Not on file   Highest education level: Not on file  Occupational History   Not on file  Tobacco Use   Smoking status: Every Day    Current packs/day: 0.25    Average packs/day: 0.3 packs/day for 45.0 years (11.3 ttl pk-yrs)    Types: Cigarettes   Smokeless tobacco: Never   Tobacco comments:    2 cigs per day   Vaping Use   Vaping status: Never Used  Substance and Sexual Activity   Alcohol use: Yes    Alcohol/week: 2.0 standard drinks of alcohol    Types: 2 Cans of beer per week    Comment: socially    Drug use: Not Currently    Types: Marijuana    Comment: 2 days ago    Sexual activity: Yes    Birth control/protection: Post-menopausal  Other Topics Concern   Not on file  Social History Narrative   Not on file   Social Determinants of Health   Financial Resource Strain: Low Risk  (03/13/2022)   Overall Financial Resource Strain (CARDIA)    Difficulty of Paying Living Expenses: Not hard at all  Food Insecurity: No Food Insecurity (09/08/2022)   Hunger Vital Sign    Worried About Running Out of Food in the Last Year: Never true    Ran Out of Food in the Last Year: Never true  Transportation Needs: No Transportation Needs (09/08/2022)   PRAPARE - Administrator, Civil Service (Medical): No    Lack of Transportation (Non-Medical): No  Physical Activity: Inactive (03/13/2022)   Exercise Vital Sign    Days of Exercise per Week: 0 days    Minutes of Exercise per Session: 0 min  Stress: No Stress Concern Present (03/13/2022)   [employer] of Occupational Health - Occupational Stress Questionnaire    Feeling of Stress : Not at all  Social Connections: Moderately Isolated (03/13/2022)   Social Connection and Isolation Panel [NHANES]    Frequency of Communication with Friends and Family: More than three times a week    Frequency of Social Gatherings with Friends and Family: More than three times a week    Attends  Religious Services: More than 4 times per year    Active Member of [employer] or Organizations: No    Attends Banker Meetings: Never    Marital Status: Divorced  Catering manager Violence: Not At Risk (09/08/2022)   Humiliation, Afraid, Rape, and Kick  questionnaire    Fear of Current or Ex-Partner: No    Emotionally Abused: No    Physically Abused: No    Sexually Abused: No    Family History:    Family History  Problem Relation Age of Onset   Asthma Mother    Cancer Mother        Brain   Cancer - Prostate Father    Cancer Maternal Grandmother        GYN cancer     ROS:  Please see the history of present illness.   All other ROS reviewed and negative.     Physical Exam/Data:   Vitals:   09/09/22 0540 09/09/22 0843 09/09/22 0906 09/09/22 0919  BP:   100/68 106/76  Pulse:   86 86  Resp:   20 18  Temp:   98 F (36.7 C)   TempSrc:   Oral   SpO2:  99% 99% 96%  Weight: 64.5 kg     Height: 5\' 6"  (1.676 m)       Intake/Output Summary (Last 24 hours) at 09/09/2022 0927 Last data filed at 09/09/2022 0422 Gross per 24 hour  Intake 1742.43 ml  Output --  Net 1742.43 ml      09/09/2022    5:40 AM 09/04/2022    9:36 AM 08/15/2022   10:14 AM  Last 3 Weights  Weight (lbs) 142 lb 3.2 oz 143 lb 143 lb 1.6 oz  Weight (kg) 64.5 kg 64.864 kg 64.91 kg     Body mass index is 22.95 kg/m.  General:  Well nourished, well developed, in no acute distress HEENT: normal Neck: no JVD Vascular: No carotid bruits; Distal pulses 2+ bilaterally Cardiac:  normal S1, S2; RRR; no murmur  Lungs:  clear to auscultation bilaterally, no wheezing, rhonchi or rales  Abd: soft, nontender, no hepatomegaly  Ext: no edema Musculoskeletal:  No deformities, BUE and BLE strength normal and equal Skin: warm and dry  Neuro:  CNs 2-12 intact, no focal abnormalities noted Psych:  Normal affect   EKG:  The EKG was personally reviewed and demonstrates:  sinus rhythm with HR 74, STE  V1-V2 Telemetry:  Telemetry was personally reviewed and demonstrates:  sinus to sinus tachycardia with HR 90-100s with PVCs  Relevant CV Studies:  Echo 11/2015: Study Conclusions   - Left ventricle: The cavity size was normal. Wall thickness was    increased in a pattern of mild LVH. Systolic function was normal.    The estimated ejection fraction was in the range of 60% to 65%.    Wall motion was normal; there were no regional wall motion    abnormalities. Doppler parameters are consistent with abnormal    left ventricular relaxation (grade 1 diastolic dysfunction).   Laboratory Data:  High Sensitivity Troponin:   Recent Labs  Lab 09/08/22 1334 09/08/22 1728 09/08/22 2057 09/08/22 2140 09/09/22 0215  TROPONINIHS 370* 414* 467* 472* 483*     Chemistry Recent Labs  Lab 09/04/22 0955 09/08/22 1334 09/09/22 0215  NA 137 136 135  K 3.9 3.4* 3.3*  CL 106 106 108  CO2 23 20* 18*  GLUCOSE 95 108* 109*  BUN 18 22 24*  CREATININE 0.71 0.81 0.86  CALCIUM 9.5 9.0 8.4*  MG  --   --  1.9  GFRNONAA >60 >60 >60  ANIONGAP 8 10 9     Recent Labs  Lab 09/04/22 0955 09/09/22 0215  PROT 7.4 6.4*  ALBUMIN 3.9 3.0*  AST 216* 261*  ALT 220* 233*  ALKPHOS 323* 220*  BILITOT 0.5 0.4   Lipids  Recent Labs  Lab 09/09/22 0215  CHOL 149  TRIG 71  HDL 52  LDLCALC 83  CHOLHDL 2.9    Hematology Recent Labs  Lab 09/04/22 0955 09/08/22 1334 09/09/22 0215  WBC 6.1 7.3 6.7  RBC 4.10 4.22 3.68*  HGB 12.3 12.3 10.8*  HCT 35.5* 37.3 32.7*  MCV 86.6 88.4 88.9  MCH 30.0 29.1 29.3  MCHC 34.6 33.0 33.0  RDW 14.9 15.3 15.4  PLT 284 289 280   Thyroid No results for input(s): "TSH", "FREET4" in the last 168 hours.  BNP Recent Labs  Lab 09/08/22 1353  BNP 91.1    DDimer No results for input(s): "DDIMER" in the last 168 hours.   Radiology/Studies:  CT Angio Chest PE W and/or Wo Contrast  Result Date: 09/08/2022 CLINICAL DATA:  Pulmonary embolus EXAM: CT ANGIOGRAPHY CHEST  WITH CONTRAST TECHNIQUE: Multidetector CT imaging of the chest was performed using the standard protocol during bolus administration of intravenous contrast. Multiplanar CT image reconstructions and MIPs were obtained to evaluate the vascular anatomy. RADIATION DOSE REDUCTION: This exam was performed according to the departmental dose-optimization program which includes automated exposure control, adjustment of the mA and/or kV according to patient size and/or use of iterative reconstruction technique. CONTRAST:  75mL OMNIPAQUE IOHEXOL 350 MG/ML SOLN COMPARISON:  Chest CTA dated July 18, 2022; MRI abdomen dated July 19, 2018 FINDINGS: Cardiovascular: No evidence of pulmonary embolus, evaluation of the subsegmental pulmonary arteries is limited due to respiratory motion artifact. Normal heart size. Normal caliber thoracic aorta with severe atherosclerotic disease. Severe coronary artery calcifications. Mediastinum/Nodes: No enlarged lymph nodes seen in the chest. Esophagus and thyroid are unremarkable. Lungs/Pleura: Central airways are patent. Scattered lower lung linear opacities which are likely due to atelectasis. No pleural effusion or pneumothorax. Upper Abdomen: Partially visualized liver lesions, portal vein thrombus, and trace ascites, consistent with history of hepatocellular carcinoma. Unchanged left adrenal gland thickening. Musculoskeletal: No aggressive appearing osseous lesions. Review of the MIP images confirms the above findings. IMPRESSION: 1. No evidence of pulmonary embolus. 2. Bilateral atelectasis. 3. Redemonstrated liver lesions, portal vein thrombus, and trace ascites, consistent with history of hepatocellular carcinoma. 4. Aortic Atherosclerosis (ICD10-I70.0). Electronically Signed   By: Allegra Lai M.D.   On: 09/08/2022 16:51   DG Chest 2 View  Result Date: 09/08/2022 CLINICAL DATA:  Shortness of breath EXAM: CHEST - 2 VIEW COMPARISON:  04/23/2017 FINDINGS: The heart size and  mediastinal contours are within normal limits. Dependent bibasilar scarring or atelectasis. Disc degenerative disease of the thoracic spine. IMPRESSION: Dependent bibasilar scarring or atelectasis. Infection or aspiration not strictly excluded. Consider short interval follow-up radiographs. Electronically Signed   By: Jearld Lesch M.D.   On: 09/08/2022 15:32     Assessment and Plan:   Chest pain NSTEMI - HS troponin 370 --> 414 --> 467 --> 472 --> 483 - EKG with ST elevation V1-V2 - repeat EKG pending, potassium has been replaced - received 324 mg ASA - NTG paste in place-  experienced chest pain with NTG that started as DOE when going to the bathroom, relieved with inhaler, but also CP relieved with IV morphine overnight - somewhat unclear order of events from patient - CTA does show severe coronary calcification - will obtain echo - difficult situation given recently diagnosed portal vein thrombus on eliquis treatment - if echo abnormal, would recommend definitive angiography, but timing will need to be  considered given her thrombus has only been treated for about 1 month with eliquis - Last dose of eliquis was last night at 2227 (09/08/22) - heparin gtt per pharmacy   Hypertension - PTA 10 mg amlodipine, lisinopril-hydrochlorothiazide 20-12.5 mg - last received ACEI-hydrochlorothiazide last night, now discontinued - metoprolol 25 mg BID started - BP controlled   PAD - on pletal - B LE duplex with bilateral LE disease - CTA C/A/P with bilateral stenosis/occlusion of proximal superficial femoral arteries   Hypokalemia K 3.3 - replace per primary   Hyperlipidemia with LDL goal < 70 09/09/2022: Cholesterol 149; HDL 52; LDL Cholesterol 83; Triglycerides 71; VLDL 14 Given elevated LFTs, will not titrate statin - on 10 mg lipitor   Portal vein thrombus - on eliquis x 1 month - eliquis now held, on heparin gtt   Hepatocellular carcinoma Transaminitis  Hepatitis C -  receiving chemotherapy - not a candidate for resection or liver transplant   Anemia Hb 10.8 (12.3 yesterday) No signs of active bleeding     Risk Assessment/Risk Scores:     TIMI Risk Score for Unstable Angina or Non-ST Elevation MI:   The patient's TIMI risk score is 5, which indicates a 26% risk of all cause mortality, new or recurrent myocardial infarction or need for urgent revascularization in the next 14 days.          For questions or updates, please contact [facility] Please consult [email or web address] for contact info under    Signed, Marcelino Duster, PA  09/09/2022 9:27 AM

## 2022-09-09 NOTE — Progress Notes (Signed)
  Echocardiogram 2D Echocardiogram has been performed.  Leda Roys RDCS 09/09/2022, 11:20 AM

## 2022-09-09 NOTE — Progress Notes (Signed)
   09/09/22 1553  Spiritual Encounters  Type of Visit Initial  Care provided to: Patient  Referral source Nurse (RN/NT/LPN);Patient request  Reason for visit Advance directives  OnCall Visit No  Advance Directives (For Healthcare)  Does Patient Have a Medical Advance Directive? No  Does patient want to make changes to medical advance directive? Yes (Inpatient - patient requests chaplain consult to change a medical advance directive)   Chaplain went to patient's room to provide AD education. Patient stated that she brought her own paperwork. Chaplain let patient know that the paperwork could not be completed in the hospital because of enteric precautions.   Arlyce Dice, Chaplain Resident

## 2022-09-09 NOTE — Progress Notes (Signed)
.         Overnight   NAME: Christine Hurley MRN: [id number] DOB : [birth year]    Date of Service   09/09/2022   HPI/Events of Note    Notified by RR RN for possible chest pains with EKG completed per their protocol.   Oxygen was directed to be placed and a single dose of morphine was ordered with immediate relief. A second EKG was completed by the time of bedside visit showing similar EKG to EKG completed for Cardiology previously.  Cardiology consulted prior.  Labs returned as follows :  Troponin progression:  Latest Reference Range & Units 09/08/22 20:57 09/08/22 21:40 09/09/22 02:15  Troponin I (High Sensitivity) <18 ng/L 467 (HH) 472 (HH) 483 (HH)  (HH): Data is critically high   Latest Reference Range & Units 09/09/22 02:15  Sodium 135 - 145 mmol/L 135  Potassium 3.5 - 5.1 mmol/L 3.3 (L)  Chloride 98 - 111 mmol/L 108  CO2 22 - 32 mmol/L 18 (L)  Glucose 70 - 99 mg/dL 829 (H)  BUN 8 - 23 mg/dL 24 (H)  Creatinine 5.62 - 1.00 mg/dL 1.30  Calcium 8.9 - 86.5 mg/dL 8.4 (L)  Anion gap 5 - 15  9  Magnesium 1.7 - 2.4 mg/dL 1.9  Alkaline Phosphatase 38 - 126 U/L 220 (H)  Albumin 3.5 - 5.0 g/dL 3.0 (L)  AST 15 - 41 U/L 261 (H)  ALT 0 - 44 U/L 233 (H)  Total Protein 6.5 - 8.1 g/dL 6.4 (L)  Total Bilirubin 0.3 - 1.2 mg/dL 0.4  GFR, Estimated >78 mL/min >60  (L): Data is abnormally low (H): Data is abnormally high   Latest Reference Range & Units 09/09/22 02:15  Magnesium 1.7 - 2.4 mg/dL 1.9       Interventions/ Plan   Replace potassium - ordered O2 as needed Continue all as previously ordered      Chinita Greenland BSN MSNA MSN ACNPC-AG Acute Care Nurse Practitioner [facility]

## 2022-09-09 NOTE — TOC Progression Note (Signed)
Transition of Care [facility]) - Progression Note    Patient Details  Name: Christine Hurley MRN: [id number] Date of Birth: [birth year]  [facility]) CM/SW Contact  Geni Bers, RN Phone Number: 09/09/2022, 9:21 AM  Clinical Narrative:     Spoke with pt concerning discharge plan and HH needs. Pt states she will not need HH and plan to return home when stable. Pt states that she is feel dizzy.  Expected Discharge Plan: Home/Self Care Barriers to Discharge: No Barriers Identified  Expected Discharge Plan and Services       Living arrangements for the past 2 months: Single Family Home                                       Social Determinants of Health (SDOH) Interventions SDOH Screenings   Food Insecurity: No Food Insecurity (09/08/2022)  Housing: Low Risk  (09/08/2022)  Transportation Needs: No Transportation Needs (09/08/2022)  Utilities: Not At Risk (09/08/2022)  Depression (PHQ2-9): Low Risk  (07/28/2022)  Financial Resource Strain: Low Risk  (03/13/2022)  Physical Activity: Inactive (03/13/2022)  Social Connections: Moderately Isolated (03/13/2022)  Stress: No Stress Concern Present (03/13/2022)  Tobacco Use: High Risk (09/08/2022)    Readmission Risk Interventions     No data to display

## 2022-09-09 NOTE — Progress Notes (Signed)
Christine Hurley   DOB:22-Aug-1950   HY#:[id number]   RSW#:[id number]  Med/onc follow up note   Subjective: Patient is well-known to me, under my care for her liver cancer.  She was last seen by me in the office on August 15, 2022, she started her immunotherapy for cancer.  She presented to the emergency room with near syncope episode today, and was admitted to hospital for non-STEMI.  She is feeling much better today.    Objective:  Vitals:   09/09/22 1004 09/09/22 1247  BP: 110/78 118/81  Pulse: 88 80  Resp:  20  Temp:  98.1 F (36.7 C)  SpO2:  97%    Body mass index is 22.95 kg/m.  Intake/Output Summary (Last 24 hours) at 09/09/2022 1609 Last data filed at 09/09/2022 0422 Gross per 24 hour  Intake 1742.43 ml  Output --  Net 1742.43 ml     Sclerae unicteric  Oropharynx clear  No peripheral adenopathy  Lungs clear -- no rales or rhonchi  Heart regular rate and rhythm  Abdomen benign  MSK no focal spinal tenderness, no peripheral edema  Neuro nonfocal    CBG (last 3)  No results for input(s): "GLUCAP" in the last 72 hours.   Labs:  Lab Results  Component Value Date   WBC 6.7 09/09/2022   HGB 10.8 (L) 09/09/2022   HCT 32.7 (L) 09/09/2022   MCV 88.9 09/09/2022   PLT 280 09/09/2022   NEUTROABS 6.0 09/08/2022    @LASTCHEMISTRY @  Urine Studies No results for input(s): "UHGB", "CRYS" in the last 72 hours.  Invalid input(s): "UACOL", "UAPR", "USPG", "UPH", "UTP", "UGL", "UKET", "UBIL", "UNIT", "UROB", "ULEU", "UEPI", "UWBC", "URBC", "UBAC", "CAST", "UCOM", "BILUA"  Basic Metabolic Panel: Recent Labs  Lab 09/04/22 0955 09/08/22 1334 09/09/22 0215  NA 137 136 135  K 3.9 3.4* 3.3*  CL 106 106 108  CO2 23 20* 18*  GLUCOSE 95 108* 109*  BUN 18 22 24*  CREATININE 0.71 0.81 0.86  CALCIUM 9.5 9.0 8.4*  MG  --   --  1.9   GFR Estimated Creatinine Clearance: 55.4 mL/min (by C-G formula based on SCr of 0.86 mg/dL). Liver Function Tests: Recent Labs  Lab  09/04/22 0955 09/09/22 0215  AST 216* 261*  ALT 220* 233*  ALKPHOS 323* 220*  BILITOT 0.5 0.4  PROT 7.4 6.4*  ALBUMIN 3.9 3.0*   No results for input(s): "LIPASE", "AMYLASE" in the last 168 hours. No results for input(s): "AMMONIA" in the last 168 hours. Coagulation profile Recent Labs  Lab 09/08/22 1910  INR 1.3*    CBC: Recent Labs  Lab 09/04/22 0955 09/08/22 1334 09/09/22 0215  WBC 6.1 7.3 6.7  NEUTROABS 5.0 6.0  --   HGB 12.3 12.3 10.8*  HCT 35.5* 37.3 32.7*  MCV 86.6 88.4 88.9  PLT 284 289 280   Cardiac Enzymes: No results for input(s): "CKTOTAL", "CKMB", "CKMBINDEX", "TROPONINI" in the last 168 hours. BNP: Invalid input(s): "POCBNP" CBG: No results for input(s): "GLUCAP" in the last 168 hours. D-Dimer No results for input(s): "DDIMER" in the last 72 hours. Hgb A1c No results for input(s): "HGBA1C" in the last 72 hours. Lipid Profile Recent Labs    09/09/22 0215  CHOL 149  HDL 52  LDLCALC 83  TRIG 71  CHOLHDL 2.9   Thyroid function studies No results for input(s): "TSH", "T4TOTAL", "T3FREE", "THYROIDAB" in the last 72 hours.  Invalid input(s): "FREET3" Anemia work up No results for input(s): "VITAMINB12", "FOLATE", "FERRITIN", "TIBC", "IRON", "  RETICCTPCT" in the last 72 hours. Microbiology Recent Results (from the past 240 hour(s))  SARS Coronavirus 2 by RT PCR (hospital order, performed in [facility] hospital lab) *cepheid single result test* Anterior Nasal Swab     Status: None   Collection Time: 09/08/22  1:34 PM   Specimen: Anterior Nasal Swab  Result Value Ref Range Status   SARS Coronavirus 2 by RT PCR NEGATIVE NEGATIVE Final    Comment: (NOTE) SARS-CoV-2 target nucleic acids are NOT DETECTED.  The SARS-CoV-2 RNA is generally detectable in upper and lower respiratory specimens during the acute phase of infection. The lowest concentration of SARS-CoV-2 viral copies this assay can detect is 250 copies / mL. A negative result does not  preclude SARS-CoV-2 infection and should not be used as the sole basis for treatment or other patient management decisions.  A negative result may occur with improper specimen collection / handling, submission of specimen other than nasopharyngeal swab, presence of viral mutation(s) within the areas targeted by this assay, and inadequate number of viral copies (<250 copies / mL). A negative result must be combined with clinical observations, patient history, and epidemiological information.  Fact Sheet for Patients:   RoadLapTop.co.za  Fact Sheet for Healthcare Providers: http://kim-miller.com/  This test is not yet approved or  cleared by the [place] FDA and has been authorized for detection and/or diagnosis of SARS-CoV-2 by FDA under an Emergency Use Authorization (EUA).  This EUA will remain in effect (meaning this test can be used) for the duration of the COVID-19 declaration under Section 564(b)(1) of the Act, 21 U.S.C. section 360bbb-3(b)(1), unless the authorization is terminated or revoked sooner.  Performed at [facility], [address], [place], [place] [address]   C Difficile Quick Screen w PCR reflex     Status: Abnormal   Collection Time: 09/09/22 12:42 PM   Specimen: STOOL  Result Value Ref Range Status   C Diff antigen POSITIVE (A) NEGATIVE Final   C Diff toxin NEGATIVE NEGATIVE Final   C Diff interpretation Results are indeterminate. See PCR results.  Final    Comment: Performed at [facility], [address], [place], [place] [address]      Studies:  ECHOCARDIOGRAM COMPLETE  Result Date: 09/09/2022    ECHOCARDIOGRAM REPORT   [name] Date of Exam: 09/09/2022 Medical Rec #:  [id number]       Height:       66.0 in Accession #:    [id number]      Weight:       142.2 lb Date of Birth:  02-24-1950       BSA:          1.730 m Patient Age:    72 years         BP:           101/78 mmHg Patient Gender: F               HR:           81 bpm. Exam Location:  Inpatient Procedure: 2D Echo, Cardiac Doppler and Color Doppler Indications:    Chest Pain  History:        Patient has prior history of Echocardiogram examinations, most                 recent 11/24/2015. COPD; Risk Factors:Dyslipidemia and  Hypertension.  Sonographer:    Harriette Bouillon RDCS Referring Phys: [id number] ANGELA NICOLE DUKE IMPRESSIONS  1. Technically difficult study with poor visualization of cardiac structures.  2. Left ventricular ejection fraction, by estimation, is 60 to 65%. The left ventricle has normal function. The left ventricle has no regional wall motion abnormalities.  3. Right ventricular systolic function is normal. The right ventricular size is normal. There is normal pulmonary artery systolic pressure.  4. The mitral valve is normal in structure. No evidence of mitral valve regurgitation. No evidence of mitral stenosis.  5. The aortic valve is normal in structure. There is mild calcification of the aortic valve. Aortic valve regurgitation is trivial. No aortic stenosis is present.  6. The inferior vena cava is normal in size with greater than 50% respiratory variability, suggesting right atrial pressure of 3 mmHg. FINDINGS  Left Ventricle: Left ventricular ejection fraction, by estimation, is 60 to 65%. The left ventricle has normal function. The left ventricle has no regional wall motion abnormalities. The left ventricular internal cavity size was normal in size. There is  no left ventricular hypertrophy. Left ventricular diastolic function could not be evaluated due to nondiagnostic images. Right Ventricle: The right ventricular size is normal. No increase in right ventricular wall thickness. Right ventricular systolic function is normal. There is normal pulmonary artery systolic pressure. The tricuspid regurgitant velocity is 2.08 m/s, and  with an assumed right atrial  pressure of 3 mmHg, the estimated right ventricular systolic pressure is 20.3 mmHg. Left Atrium: Left atrial size was normal in size. Right Atrium: Right atrial size was normal in size. Pericardium: There is no evidence of pericardial effusion. Mitral Valve: The mitral valve is normal in structure. No evidence of mitral valve regurgitation. No evidence of mitral valve stenosis. Tricuspid Valve: The tricuspid valve is normal in structure. Tricuspid valve regurgitation is trivial. No evidence of tricuspid stenosis. Aortic Valve: The aortic valve is normal in structure. There is mild calcification of the aortic valve. Aortic valve regurgitation is trivial. No aortic stenosis is present. Pulmonic Valve: The pulmonic valve was normal in structure. Pulmonic valve regurgitation is not visualized. No evidence of pulmonic stenosis. Aorta: The aortic root is normal in size and structure. Venous: The inferior vena cava is normal in size with greater than 50% respiratory variability, suggesting right atrial pressure of 3 mmHg. IAS/Shunts: No atrial level shunt detected by color flow Doppler.  LEFT VENTRICLE PLAX 2D LVIDd:         4.10 cm   Diastology LVIDs:         2.87 cm   LV e' medial:    5.44 cm/s LV IVS:        0.90 cm   LV E/e' medial:  8.5 LVOT diam:     1.70 cm   LV e' lateral:   5.11 cm/s LV SV:         46        LV E/e' lateral: 9.1 LV SV Index:   27 LVOT Area:     2.27 cm  RIGHT VENTRICLE            IVC RV S prime:     9.25 cm/s  IVC diam: 1.70 cm TAPSE (M-mode): 1.5 cm LEFT ATRIUM             Index        RIGHT ATRIUM          Index LA diam:  3.00 cm 1.73 cm/m   RA Area:     6.80 cm LA Vol (A2C):   20.4 ml 11.79 ml/m  RA Volume:   12.50 ml 7.23 ml/m LA Vol (A4C):   25.0 ml 14.45 ml/m LA Biplane Vol: 23.6 ml 13.64 ml/m  AORTIC VALVE LVOT Vmax:   136.00 cm/s LVOT Vmean:  79.300 cm/s LVOT VTI:    0.202 m  AORTA Ao Root diam: 3.10 cm MITRAL VALVE               TRICUSPID VALVE MV Area (PHT): 2.48 cm    TR  Peak grad:   17.3 mmHg MV Decel Time: 306 msec    TR Vmax:        208.00 cm/s MV E velocity: 46.50 cm/s MV A velocity: 90.30 cm/s  SHUNTS MV E/A ratio:  0.51        Systemic VTI:  0.20 m                            Systemic Diam: 1.70 cm Aditya Sabharwal Electronically signed by Dorthula Nettles Signature Date/Time: 09/09/2022/2:15:48 PM    Final    US Abdomen Limited RUQ (LIVER/GB)  Result Date: 09/09/2022 CLINICAL DATA:  Liver masses.  Elevated liver function tests EXAM: ULTRASOUND ABDOMEN LIMITED RIGHT UPPER QUADRANT COMPARISON:  MRI 07/19/2022 FINDINGS: Gallbladder: Distended gallbladder. There is wall thickening and edema. Some sludge. Echogenic shadowing along the wall focally. Common bile duct: Diameter: 5 mm Liver: Diffusely heterogeneous with multiple mass lesions better seen on the prior examinations measuring up to 5.5 cm. On prior study there was thrombus extending into the portal vein. Other: Mild ascites IMPRESSION: Multiple liver masses as seen on multiple prior examinations with the occlusion of the portal vein. Dilated gallbladder with wall thickening and edema and sludge. Echogenic shadowing along the wall focally. Some small dependent stones versus wall calcification is possible Electronically Signed   By: Karen Kays M.D.   On: 09/09/2022 12:04   CT Angio Chest PE W and/or Wo Contrast  Result Date: 09/08/2022 CLINICAL DATA:  Pulmonary embolus EXAM: CT ANGIOGRAPHY CHEST WITH CONTRAST TECHNIQUE: Multidetector CT imaging of the chest was performed using the standard protocol during bolus administration of intravenous contrast. Multiplanar CT image reconstructions and MIPs were obtained to evaluate the vascular anatomy. RADIATION DOSE REDUCTION: This exam was performed according to the departmental dose-optimization program which includes automated exposure control, adjustment of the mA and/or kV according to patient size and/or use of iterative reconstruction technique. CONTRAST:  75mL  OMNIPAQUE IOHEXOL 350 MG/ML SOLN COMPARISON:  Chest CTA dated July 18, 2022; MRI abdomen dated July 19, 2018 FINDINGS: Cardiovascular: No evidence of pulmonary embolus, evaluation of the subsegmental pulmonary arteries is limited due to respiratory motion artifact. Normal heart size. Normal caliber thoracic aorta with severe atherosclerotic disease. Severe coronary artery calcifications. Mediastinum/Nodes: No enlarged lymph nodes seen in the chest. Esophagus and thyroid are unremarkable. Lungs/Pleura: Central airways are patent. Scattered lower lung linear opacities which are likely due to atelectasis. No pleural effusion or pneumothorax. Upper Abdomen: Partially visualized liver lesions, portal vein thrombus, and trace ascites, consistent with history of hepatocellular carcinoma. Unchanged left adrenal gland thickening. Musculoskeletal: No aggressive appearing osseous lesions. Review of the MIP images confirms the above findings. IMPRESSION: 1. No evidence of pulmonary embolus. 2. Bilateral atelectasis. 3. Redemonstrated liver lesions, portal vein thrombus, and trace ascites, consistent with history of hepatocellular carcinoma. 4. Aortic Atherosclerosis (ICD10-I70.0).  Electronically Signed   By: Allegra Lai M.D.   On: 09/08/2022 16:51   DG Chest 2 View  Result Date: 09/08/2022 CLINICAL DATA:  Shortness of breath EXAM: CHEST - 2 VIEW COMPARISON:  04/23/2017 FINDINGS: The heart size and mediastinal contours are within normal limits. Dependent bibasilar scarring or atelectasis. Disc degenerative disease of the thoracic spine. IMPRESSION: Dependent bibasilar scarring or atelectasis. Infection or aspiration not strictly excluded. Consider short interval follow-up radiographs. Electronically Signed   By: Jearld Lesch M.D.   On: 09/08/2022 15:32    Assessment: 71 y.o. female   Non-STEMI Chronic diarrhea Stage IIIa hepatocellular carcinoma, started immunotherapy on August 15, 2022. Hep C Portal vein  thrombosis, secondary to [facility] Peripheral vascular disease, hypertension Skin rash, secondary to immunotherapy, near resolved.    Plan:  -He is non-STEMI has been treated with medical management, and she is scheduled for left heart catheterization tomorrow, Eliquis has been held for that. -I do not think her non-STEMI was related to her recent immunotherapy, but out postpone her next treatment for couple weeks, to allow her recover well. -She has had a chronic diarrhea for few months, not related to her recent immunotherapy.  C. difficile antigen was positive, toxin was negative. She is probably a carrier  -continue supportive care  -I will see her as needed, please call me if anything I can help.   Malachy Mood, MD 09/09/2022  4:09 PM

## 2022-09-10 ENCOUNTER — Encounter (HOSPITAL_COMMUNITY)
Admission: EM | Disposition: A | Discharge status: Home / Self Care | Payer: Self-pay | Source: Ambulatory Visit | Attending: Internal Medicine

## 2022-09-10 ENCOUNTER — Ambulatory Visit (HOSPITAL_COMMUNITY): Admit: 2022-09-10 | Payer: Medicare HMO | Admitting: Cardiology

## 2022-09-10 DIAGNOSIS — I2510 Atherosclerotic heart disease of native coronary artery without angina pectoris: Secondary | ICD-10-CM

## 2022-09-10 DIAGNOSIS — I214 Non-ST elevation (NSTEMI) myocardial infarction: Secondary | ICD-10-CM | POA: Diagnosis not present

## 2022-09-10 HISTORY — PX: LEFT HEART CATH AND CORONARY ANGIOGRAPHY: CATH118249

## 2022-09-10 SURGERY — LEFT HEART CATH AND CORONARY ANGIOGRAPHY
Anesthesia: LOCAL

## 2022-09-10 MED ORDER — FENTANYL CITRATE (PF) 100 MCG/2ML IJ SOLN
INTRAMUSCULAR | Status: DC | PRN
  Administered 2022-09-10: 25 ug via INTRAVENOUS

## 2022-09-10 MED ORDER — MECLIZINE HCL 25 MG PO TABS
12.5000 mg | ORAL_TABLET | Freq: Three times a day (TID) | ORAL | Status: AC
  Administered 2022-09-10 – 2022-09-11 (×5): 12.5 mg via ORAL
  Filled 2022-09-10 (×5): qty 1

## 2022-09-10 MED ORDER — LIDOCAINE HCL (PF) 1 % IJ SOLN
INTRAMUSCULAR | Status: DC | PRN
  Administered 2022-09-10: 2 mL

## 2022-09-10 MED ORDER — SODIUM CHLORIDE 0.9% FLUSH: 3.0000 mL | INTRAVENOUS | Status: DC | PRN

## 2022-09-10 MED ORDER — HEPARIN BOLUS VIA INFUSION
3000.0000 [IU] | Freq: Once | INTRAVENOUS | Status: AC
  Administered 2022-09-10: 3000 [IU] via INTRAVENOUS
  Filled 2022-09-10: qty 3000

## 2022-09-10 MED ORDER — SODIUM CHLORIDE 0.9% FLUSH
3.0000 mL | Freq: Two times a day (BID) | INTRAVENOUS | Status: DC
  Administered 2022-09-10 – 2022-09-12 (×3): 3 mL via INTRAVENOUS

## 2022-09-10 MED ORDER — HEPARIN SODIUM (PORCINE) 1000 UNIT/ML IJ SOLN
INTRAMUSCULAR | Status: DC | PRN
  Administered 2022-09-10: 3000 [IU] via INTRAVENOUS

## 2022-09-10 MED ORDER — MIDAZOLAM HCL 2 MG/2ML IJ SOLN
INTRAMUSCULAR | Status: DC | PRN
  Administered 2022-09-10: 1 mg via INTRAVENOUS

## 2022-09-10 MED ORDER — SODIUM CHLORIDE 0.9 % IV SOLN: 250.0000 mL | INTRAVENOUS | Status: DC | PRN

## 2022-09-10 MED ORDER — VERAPAMIL HCL 2.5 MG/ML IV SOLN
INTRAVENOUS | Status: DC | PRN
  Administered 2022-09-10: 10 mL via INTRA_ARTERIAL

## 2022-09-10 MED ORDER — SODIUM CHLORIDE 0.9 % WEIGHT BASED INFUSION: 1.0000 mL/kg/h | INTRAVENOUS | Status: AC

## 2022-09-10 MED ORDER — IOHEXOL 350 MG/ML SOLN
INTRAVENOUS | Status: DC | PRN
  Administered 2022-09-10: 35 mL

## 2022-09-10 MED ORDER — HEPARIN (PORCINE) IN NACL 1000-0.9 UT/500ML-% IV SOLN
INTRAVENOUS | Status: DC | PRN
  Administered 2022-09-10 (×2): 500 mL

## 2022-09-10 MED ORDER — LOPERAMIDE HCL 2 MG PO CAPS
2.0000 mg | ORAL_CAPSULE | Freq: Three times a day (TID) | ORAL | Status: AC
  Administered 2022-09-10 – 2022-09-11 (×5): 2 mg via ORAL
  Filled 2022-09-10 (×5): qty 1

## 2022-09-10 MED ORDER — APIXABAN 5 MG PO TABS
5.0000 mg | ORAL_TABLET | Freq: Two times a day (BID) | ORAL | Status: DC
  Administered 2022-09-10 – 2022-09-12 (×4): 5 mg via ORAL
  Filled 2022-09-10 (×4): qty 1

## 2022-09-10 SURGICAL SUPPLY — 10 items
CATH 5FR JL3.5 JR4 ANG PIG MP (CATHETERS) IMPLANT
DEVICE RAD COMP TR BAND LRG (VASCULAR PRODUCTS) IMPLANT
GLIDESHEATH SLEND SS 6F .021 (SHEATH) IMPLANT
GUIDEWIRE INQWIRE 1.5J.035X260 (WIRE) IMPLANT
INQWIRE 1.5J .035X260CM (WIRE) ×1
KIT HEART LEFT (KITS) ×1 IMPLANT
PACK CARDIAC CATHETERIZATION (CUSTOM PROCEDURE TRAY) ×1 IMPLANT
SYR MEDRAD MARK 7 150ML (SYRINGE) ×1 IMPLANT
TRANSDUCER W/STOPCOCK (MISCELLANEOUS) ×1 IMPLANT
TUBING CIL FLEX 10 FLL-RA (TUBING) ×1 IMPLANT

## 2022-09-10 NOTE — Progress Notes (Signed)
ANTICOAGULATION CONSULT NOTE  Pharmacy Consult for IV Heparin Indication: ACS/STEMI  Allergies  Allergen Reactions   Famotidine     "Made me tired and weak"    Patient Measurements: Height: 5\' 6"  (167.6 cm) Weight: 64.2 kg (141 lb 8.6 oz) IBW/kg (Calculated) : 59.3 Heparin Dosing Weight: TBW  Vital Signs: Temp: 98.2 F (36.8 C) (07/31 0832) Temp Source: Oral (07/31 0832) BP: 133/93 (07/31 0832) Pulse Rate: 80 (07/31 0832)  Labs: Recent Labs    09/08/22 1334 09/08/22 1728 09/08/22 1910 09/08/22 2057 09/08/22 2140 09/09/22 0215 09/09/22 1818 09/09/22 2132 09/10/22 0709  HGB 12.3  --   --   --   --  10.8*  --   --  11.6*  HCT 37.3  --   --   --   --  32.7*  --   --  36.1  PLT 289  --   --   --   --  280  --   --  263  APTT  --    < > 31  --   --   --  >200* 44* 56*  LABPROT  --   --  16.3*  --   --   --   --   --  14.7  INR  --   --  1.3*  --   --   --   --   --  1.1  HEPARINUNFRC  --   --  >1.10*  --   --   --   --   --  0.54  CREATININE 0.81  --   --   --   --  0.86  --   --  0.69  TROPONINIHS 370*   < >  --  467* 472* 483*  --   --   --    < > = values in this interval not displayed.    Estimated Creatinine Clearance: 59.5 mL/min (by C-G formula based on SCr of 0.69 mg/dL).  Assessment: 70 yoF with PMH hepatocellular carcinoma and portal vein thrombosis on chronic Eliquis, presents 7/29 with SOB, dizziness, and DOE. EKG shows borderline ST elevation; troponins elevated. CTA negative for PE. Pharmacy consulted to dose heparin for NSTEMI.  Last dose of Eliquis 7/29 @ 2227  Today, 09/10/2022: aPTT remains low but improved from prior with heparin rate increased to 950 units/hr (not using heparin levels d/t interference from recent DOAC) CBC: Hgb low but improved to near baseline; Plt stable WNL No bleeding or infusion related concerns reported by RN  Goal of Therapy:  Heparin level 0.3-0.7 units/ml aPTT 66-102 sec Monitor platelets by anticoagulation protocol:  Yes  Plan:  Increase IV heparin to 1150 units/hr  Recheck aPTT 8h after rate change Daily CBC and heparin level; aPTTs as needed while DOAC effects on HL persist Current plan is to transfer to [facility] today for [facility] cath; do not anticipate need to pause heparin prior to [facility] Monitor for s/s bleeding or worsening thrombosis   Thank you for allowing pharmacy to be a part of this patient's care.  Bernadene Person, PharmD, BCPS [phone] 09/10/2022, 8:41 AM

## 2022-09-10 NOTE — Plan of Care (Signed)
  Problem: Cardiac: Goal: Ability to achieve and maintain adequate cardiovascular perfusion will improve Outcome: Progressing   Problem: Safety: Goal: Ability to remain free from injury will improve Outcome: Progressing

## 2022-09-10 NOTE — Progress Notes (Addendum)
Rounding Note    Patient Name: Christine Hurley Date of Encounter: 09/10/2022  [facility] Cardiologist: Chilton Si, MD   Subjective   Diagnosed with C diff since I saw her yesterday. Another bout of chest pain overnight that she states was relieved with inhaler - per record appears she received IV morphine. She feels depressed today. She had 4 BM yesterday and has gone this morning. We discussed LHC today and she states she can "hold it."  Inpatient Medications    Scheduled Meds:  atorvastatin  20 mg Oral QHS   cilostazol  100 mg Oral BID   dronabinol  2.5 mg Oral BID AC   famotidine  20 mg Oral BID   heparin  3,000 Units Intravenous Once   metoprolol tartrate  25 mg Oral BID   umeclidinium bromide  1 puff Inhalation Daily   Continuous Infusions:  sodium chloride 50 mL/hr at 09/09/22 1339   heparin 950 Units/hr (09/09/22 2247)   PRN Meds: acetaminophen, albuterol, loperamide, ondansetron (ZOFRAN) IV, prochlorperazine   Vital Signs    Vitals:   09/09/22 1317 09/09/22 2045 09/10/22 0800 09/10/22 0832  BP:  127/79  (!) 133/93  Pulse:  80  80  Resp:    20  Temp:  (!) 97.1 F (36.2 C)  98.2 F (36.8 C)  TempSrc:  Oral  Oral  SpO2:  97% 95% 100%  Weight: 64.2 kg     Height:        Intake/Output Summary (Last 24 hours) at 09/10/2022 0853 Last data filed at 09/09/2022 1838 Gross per 24 hour  Intake 443.33 ml  Output 1 ml  Net 442.33 ml      09/09/2022    1:17 PM 09/09/2022    5:40 AM 09/04/2022    9:36 AM  Last 3 Weights  Weight (lbs) 141 lb 8.6 oz 142 lb 3.2 oz 143 lb  Weight (kg) 64.2 kg 64.5 kg 64.864 kg      Telemetry    Sinus rhythm with HR 80s - Personally Reviewed  ECG    No new tracings - Personally Reviewed  Physical Exam   GEN: No acute distress.   Neck: No JVD Cardiac: RRR, no murmurs, rubs, or gallops.  Respiratory: Clear to auscultation bilaterally. GI: Soft, nontender, non-distended  MS: No edema; No deformity. Neuro:   Nonfocal  Psych: Normal affect   Labs    High Sensitivity Troponin:   Recent Labs  Lab 09/08/22 1334 09/08/22 1728 09/08/22 2057 09/08/22 2140 09/09/22 0215  TROPONINIHS 370* 414* 467* 472* 483*     Chemistry Recent Labs  Lab 09/04/22 0955 09/08/22 1334 09/09/22 0215 09/10/22 0709  NA 137 136 135 137  K 3.9 3.4* 3.3* 4.5  CL 106 106 108 107  CO2 23 20* 18* 21*  GLUCOSE 95 108* 109* 101*  BUN 18 22 24* 22  CREATININE 0.71 0.81 0.86 0.69  CALCIUM 9.5 9.0 8.4* 9.0  MG  --   --  1.9  --   PROT 7.4  --  6.4*  --   ALBUMIN 3.9  --  3.0*  --   AST 216*  --  261*  --   ALT 220*  --  233*  --   ALKPHOS 323*  --  220*  --   BILITOT 0.5  --  0.4  --   GFRNONAA >60 >60 >60 >60  ANIONGAP 8 10 9 9     Lipids  Recent Labs  Lab 09/09/22 0215  CHOL 149  TRIG 71  HDL 52  LDLCALC 83  CHOLHDL 2.9    Hematology Recent Labs  Lab 09/08/22 1334 09/09/22 0215 09/10/22 0709  WBC 7.3 6.7 6.6  RBC 4.22 3.68* 3.90  HGB 12.3 10.8* 11.6*  HCT 37.3 32.7* 36.1  MCV 88.4 88.9 92.6  MCH 29.1 29.3 29.7  MCHC 33.0 33.0 32.1  RDW 15.3 15.4 15.7*  PLT 289 280 263   Thyroid No results for input(s): "TSH", "FREET4" in the last 168 hours.  BNP Recent Labs  Lab 09/08/22 1353  BNP 91.1    DDimer No results for input(s): "DDIMER" in the last 168 hours.   Radiology    ECHOCARDIOGRAM COMPLETE  Result Date: 09/09/2022    ECHOCARDIOGRAM REPORT   Patient Name:   NATHAN GOPALAKRISHNAN Date of Exam: 09/09/2022 Medical Rec #:  [id number]       Height:       66.0 in Accession #:    [id number]      Weight:       142.2 lb Date of Birth:  1950-11-18       BSA:          1.730 m Patient Age:    72 years        BP:           101/78 mmHg Patient Gender: F               HR:           81 bpm. Exam Location:  Inpatient Procedure: 2D Echo, Cardiac Doppler and Color Doppler Indications:    Chest Pain  History:        Patient has prior history of Echocardiogram examinations, most                 recent  11/24/2015. COPD; Risk Factors:Dyslipidemia and                 Hypertension.  Sonographer:    Harriette Bouillon RDCS Referring Phys: [id number]  NICOLE  IMPRESSIONS  1. Technically difficult study with poor visualization of cardiac structures.  2. Left ventricular ejection fraction, by estimation, is 60 to 65%. The left ventricle has normal function. The left ventricle has no regional wall motion abnormalities.  3. Right ventricular systolic function is normal. The right ventricular size is normal. There is normal pulmonary artery systolic pressure.  4. The mitral valve is normal in structure. No evidence of mitral valve regurgitation. No evidence of mitral stenosis.  5. The aortic valve is normal in structure. There is mild calcification of the aortic valve. Aortic valve regurgitation is trivial. No aortic stenosis is present.  6. The inferior vena cava is normal in size with greater than 50% respiratory variability, suggesting right atrial pressure of 3 mmHg. FINDINGS  Left Ventricle: Left ventricular ejection fraction, by estimation, is 60 to 65%. The left ventricle has normal function. The left ventricle has no regional wall motion abnormalities. The left ventricular internal cavity size was normal in size. There is  no left ventricular hypertrophy. Left ventricular diastolic function could not be evaluated due to nondiagnostic images. Right Ventricle: The right ventricular size is normal. No increase in right ventricular wall thickness. Right ventricular systolic function is normal. There is normal pulmonary artery systolic pressure. The tricuspid regurgitant velocity is 2.08 m/s, and  with an assumed right atrial pressure of 3 mmHg, the estimated right ventricular systolic pressure is 20.3 mmHg. Left Atrium: Left atrial size was normal  in size. Right Atrium: Right atrial size was normal in size. Pericardium: There is no evidence of pericardial effusion. Mitral Valve: The mitral valve is normal in  structure. No evidence of mitral valve regurgitation. No evidence of mitral valve stenosis. Tricuspid Valve: The tricuspid valve is normal in structure. Tricuspid valve regurgitation is trivial. No evidence of tricuspid stenosis. Aortic Valve: The aortic valve is normal in structure. There is mild calcification of the aortic valve. Aortic valve regurgitation is trivial. No aortic stenosis is present. Pulmonic Valve: The pulmonic valve was normal in structure. Pulmonic valve regurgitation is not visualized. No evidence of pulmonic stenosis. Aorta: The aortic root is normal in size and structure. Venous: The inferior vena cava is normal in size with greater than 50% respiratory variability, suggesting right atrial pressure of 3 mmHg. IAS/Shunts: No atrial level shunt detected by color flow Doppler.  LEFT VENTRICLE PLAX 2D LVIDd:         4.10 cm   Diastology LVIDs:         2.87 cm   LV e' medial:    5.44 cm/s LV IVS:        0.90 cm   LV E/e' medial:  8.5 LVOT diam:     1.70 cm   LV e' lateral:   5.11 cm/s LV SV:         46        LV E/e' lateral: 9.1 LV SV Index:   27 LVOT Area:     2.27 cm  RIGHT VENTRICLE            IVC RV S prime:     9.25 cm/s  IVC diam: 1.70 cm TAPSE (M-mode): 1.5 cm LEFT ATRIUM             Index        RIGHT ATRIUM          Index LA diam:        3.00 cm 1.73 cm/m   RA Area:     6.80 cm LA Vol (A2C):   20.4 ml 11.79 ml/m  RA Volume:   12.50 ml 7.23 ml/m LA Vol (A4C):   25.0 ml 14.45 ml/m LA Biplane Vol: 23.6 ml 13.64 ml/m  AORTIC VALVE LVOT Vmax:   136.00 cm/s LVOT Vmean:  79.300 cm/s LVOT VTI:    0.202 m  AORTA Ao Root diam: 3.10 cm MITRAL VALVE               TRICUSPID VALVE MV Area (PHT): 2.48 cm    TR Peak grad:   17.3 mmHg MV Decel Time: 306 msec    TR Vmax:        208.00 cm/s MV E velocity: 46.50 cm/s MV A velocity: 90.30 cm/s  SHUNTS MV E/A ratio:  0.51        Systemic VTI:  0.20 m                            Systemic Diam: 1.70 cm Aditya Sabharwal Electronically signed by Dorthula Nettles Signature Date/Time: 09/09/2022/2:15:48 PM    Final    US Abdomen Limited RUQ (LIVER/GB)  Result Date: 09/09/2022 CLINICAL DATA:  Liver masses.  Elevated liver function tests EXAM: ULTRASOUND ABDOMEN LIMITED RIGHT UPPER QUADRANT COMPARISON:  MRI 07/19/2022 FINDINGS: Gallbladder: Distended gallbladder. There is wall thickening and edema. Some sludge. Echogenic shadowing along the wall focally. Common bile duct: Diameter: 5 mm Liver: Diffusely heterogeneous  with multiple mass lesions better seen on the prior examinations measuring up to 5.5 cm. On prior study there was thrombus extending into the portal vein. Other: Mild ascites IMPRESSION: Multiple liver masses as seen on multiple prior examinations with the occlusion of the portal vein. Dilated gallbladder with wall thickening and edema and sludge. Echogenic shadowing along the wall focally. Some small dependent stones versus wall calcification is possible Electronically Signed   By: Karen Kays M.D.   On: 09/09/2022 12:04   CT Angio Chest PE W and/or Wo Contrast  Result Date: 09/08/2022 CLINICAL DATA:  Pulmonary embolus EXAM: CT ANGIOGRAPHY CHEST WITH CONTRAST TECHNIQUE: Multidetector CT imaging of the chest was performed using the standard protocol during bolus administration of intravenous contrast. Multiplanar CT image reconstructions and MIPs were obtained to evaluate the vascular anatomy. RADIATION DOSE REDUCTION: This exam was performed according to the departmental dose-optimization program which includes automated exposure control, adjustment of the mA and/or kV according to patient size and/or use of iterative reconstruction technique. CONTRAST:  75mL OMNIPAQUE IOHEXOL 350 MG/ML SOLN COMPARISON:  Chest CTA dated July 18, 2022; MRI abdomen dated July 19, 2018 FINDINGS: Cardiovascular: No evidence of pulmonary embolus, evaluation of the subsegmental pulmonary arteries is limited due to respiratory motion artifact. Normal heart size. Normal  caliber thoracic aorta with severe atherosclerotic disease. Severe coronary artery calcifications. Mediastinum/Nodes: No enlarged lymph nodes seen in the chest. Esophagus and thyroid are unremarkable. Lungs/Pleura: Central airways are patent. Scattered lower lung linear opacities which are likely due to atelectasis. No pleural effusion or pneumothorax. Upper Abdomen: Partially visualized liver lesions, portal vein thrombus, and trace ascites, consistent with history of hepatocellular carcinoma. Unchanged left adrenal gland thickening. Musculoskeletal: No aggressive appearing osseous lesions. Review of the MIP images confirms the above findings. IMPRESSION: 1. No evidence of pulmonary embolus. 2. Bilateral atelectasis. 3. Redemonstrated liver lesions, portal vein thrombus, and trace ascites, consistent with history of hepatocellular carcinoma. 4. Aortic Atherosclerosis (ICD10-I70.0). Electronically Signed   By: Allegra Lai M.D.   On: 09/08/2022 16:51   DG Chest 2 View  Result Date: 09/08/2022 CLINICAL DATA:  Shortness of breath EXAM: CHEST - 2 VIEW COMPARISON:  04/23/2017 FINDINGS: The heart size and mediastinal contours are within normal limits. Dependent bibasilar scarring or atelectasis. Disc degenerative disease of the thoracic spine. IMPRESSION: Dependent bibasilar scarring or atelectasis. Infection or aspiration not strictly excluded. Consider short interval follow-up radiographs. Electronically Signed   By: Jearld Lesch M.D.   On: 09/08/2022 15:32    Cardiac Studies   Left heart cath today  Echo 09/09/22:  1. Technically difficult study with poor visualization of cardiac  structures.   2. Left ventricular ejection fraction, by estimation, is 60 to 65%. The  left ventricle has normal function. The left ventricle has no regional  wall motion abnormalities.   3. Right ventricular systolic function is normal. The right ventricular  size is normal. There is normal pulmonary artery systolic  pressure.   4. The mitral valve is normal in structure. No evidence of mitral valve  regurgitation. No evidence of mitral stenosis.   5. The aortic valve is normal in structure. There is mild calcification  of the aortic valve. Aortic valve regurgitation is trivial. No aortic  stenosis is present.   6. The inferior vena cava is normal in size with greater than 50%  respiratory variability, suggesting right atrial pressure of 3 mmHg.   Patient Profile     72 y.o. female with a hx of  aortic valve sclerosis, hypertension, smoking history, COPD, PAD, hepatitis C, and hepatocellular carcinoma with recent diagnosis of portal vein thrombus on eliquis x 1 month who is being seen for the evaluation of chest pain and abnormal EKG.   Initial EKG concerning for STE elevation anterior leads. STEMI canceled given no active chest pain at the time of evaluation. She was continued on eliquis.   Assessment & Plan    Chest pain NSTEMI Coronary calcifications on CT - hs troponin 370 --> 414 --> 467 --> 472 --> 483  - EKG with STE V1-V2 with Q waves - pt difficult historian, but chest pain concerning for unstable angina with NTG paste in place and IV doses of morphine - echo with preserved EF but anterior wall hypokinesis - CTA with sever coronary calcifications - eliquis held (last dose 02/08/23 2227) - left heat cath today   C diff antigen positive, toxin negative Diarrhea x "few months", suspected carrier - on imodium, precautions but no treatment - she states she feels weak, likely GI losses - can provide protein supplement/boost - oncology does not think this is related to her chemotherapy   Hypertension - home medications on hold, continue lopressor 25 mg BID - BP OK   PAD - on pletal - B LE duplex with bilateral LE disease - CTA C/A/P with bilateral stenosis/occlusion of proximal superficial femoral arteries   Hyperlipidemia with LDL goal < 70 09/09/2022: Cholesterol 149; HDL 52;  LDL Cholesterol 83; Triglycerides 71; VLDL 14 Given elevated LFTs, will not titrate statin - on 10 mg lipitor     Portal vein thrombus - on eliquis x 1 month - eliquis now held, on heparin gtt     Hepatocellular carcinoma Transaminitis  Hepatitis C - receiving chemotherapy - not a candidate for resection or liver transplant - consult to IR for targeted therapy, per oncology - will defer chemo for a 2 weeks for recovery if intervention on cath today       For questions or updates, please contact [facility] Please consult [email or web address] for contact info under        Signed, Marcelino Duster, PA  09/10/2022, 8:53 AM

## 2022-09-10 NOTE — Progress Notes (Signed)
TR BAND REMOVAL  LOCATION:    Right radial  DEFLATED PER PROTOCOL:    Yes.    TIME BAND OFF / DRESSING APPLIED:    1330p a clean dry dressing  applied with gauze and tegaderm, covered with coban  SITE UPON ARRIVAL:    Level 0  SITE AFTER BAND REMOVAL:    Level 0  CIRCULATION SENSATION AND MOVEMENT:    Within Normal Limits   Yes.    COMMENTS:   Care instructions given to patient

## 2022-09-10 NOTE — Interval H&P Note (Signed)
History and Physical Interval Note:  09/10/2022 10:34 AM  Christine Hurley  has presented today for surgery, with the diagnosis of unstable angina.  The various methods of treatment have been discussed with the patient and family. After consideration of risks, benefits and other options for treatment, the patient has consented to  Procedure(s): LEFT HEART CATH AND CORONARY ANGIOGRAPHY (N/A) as a surgical intervention.  The patient's history has been reviewed, patient examined, no change in status, stable for surgery.  I have reviewed the patient's chart and labs.  Questions were answered to the patient's satisfaction.   Cath Lab Visit (complete for each Cath Lab visit)  Clinical Evaluation Leading to the Procedure:   ACS: Yes.    Non-ACS:    Anginal Classification: CCS IV  Anti-ischemic medical therapy: Minimal Therapy (1 class of medications)  Non-Invasive Test Results: No non-invasive testing performed  Prior CABG: No previous CABG        Theron Arista [facility] 09/10/2022 10:34 AM

## 2022-09-10 NOTE — Progress Notes (Signed)
PT Cancellation Note  Patient Details Name: Christine Hurley MRN: [id number] DOB: 1950-10-28   Cancelled Treatment:    Reason Eval/Treat Not Completed: Patient at procedure or test/unavailable    Faye Ramsay, PT Acute Rehabilitation  Office: [phone]

## 2022-09-10 NOTE — Progress Notes (Signed)
PT Cancellation Note  Patient Details Name: Christine Hurley MRN: [id number] DOB: 1950/09/18   Cancelled Treatment:    Reason Eval/Treat Not Completed:  Pt declined participation with PT on today. She requests PT check back on tomorrow for PT eval    Faye Ramsay, PT Acute Rehabilitation  Office: [phone]

## 2022-09-10 NOTE — Progress Notes (Signed)
PROGRESS NOTE  Christine Hurley  DOB: 01-16-1951  PCP: Willow Ora, MD NUU:[id number]  DOA: 09/08/2022  LOS: 2 days  Hospital Day: 3  Brief narrative: Christine Hurley is a 72 y.o. female with PMH significant for HCC currently on chemotherapy, HTN, PAD, COPD, hep C, depression 7/5, received last chemotherapy.  Follows up with Dr. [name] 7/1, patient presented to the ED from home with complaint of progressively worsening shortness of breath, dizziness for a week, almost passed out on the day of presentation.  Patient also reported that she is having diarrhea for last 1 month.  In the ED, patient was afebrile, heart rate 113, blood pressure stable, breathing on room air Initial troponin was elevated to 414 it is trended with further draws CBC unremarkable, BMP with potassium low at 3.4 CT angio chest did not show evidence of pulm embolism, but showed bibasilar atelectasis and coronary calcifications.  ED physician started the patient on IV antibiotics for potential pneumonia After few hours in the ED, patient started complaining of chest pressure and nausea.  She was started on aspirin, and Zofran.   She was started on heparin drip EDP discussed with cardiologist on-call Dr. Wyline Mood who recommended medical management and no immediate need of transfer to [facility]. Admitted to [facility] for  Subjective Patient was seen and examined this morning.  Lying on bed.  Stool assay positive for C. difficile antigen, negative toxin and negative PCR.  Had another bout of chest pain overnight relieved with inhalers and IV morphine.   Assessment and plan: NSTEMI Presented with chest pain, shortness of breath, has intermittent flareup of symptoms in the hospital as well. Troponin trend as below, last reading was 483  Coronary calcifications seen on CT scan of chest. EKG suspicious for ST elevation in V1 and V2. Cardiology consulted.  Initiated on heparin drip. Echocardiogram with preserved EF but anterior  wall hypokinesis. Cardiology has a plan to do LHC today. Recent Labs    09/08/22 1334 09/08/22 1728 09/08/22 2057 09/08/22 2140 09/09/22 0215  TROPONINIHS 370* 414* 467* 472* 483*   Chronic diarrhea C. difficile carrier Patient has chronic diarrhea for last few months, 3-4 episodes a day probably making her dehydrated and dizzy C. difficile assay showed antigen positive, toxin negative, PCR negative -carrier state.  No need of vancomycin Currently on as needed Imodium.  5 episodes of BM in last 24 hours.  I will also add scheduled Imodium 2 mg 3 times daily for next 2 days.  Postural dizziness H/o HTN Reports postural hypotension.  Better today with IV fluid.  Orthostatic vital measurement last night was negative probably after hydration Mild dizziness continues.  Start meclizine scheduled 12.5 mg 3 times daily for next 2 days. PTA meds-amlodipine 10 mg daily, lisinopril 20 mg daily, HCTZ 12.5 mg daily Home BP meds on hold. Currently blood pressure is controlled on on metoprolol 25 mg twice daily Continue IV fluid.  Continue to monitor orthostatic vitals.  Elevated liver enzymes Chronic hep C History of HCC LFT trend as below including elevated alk phos Follows up with oncologist Dr. [name]. Last chemo 7/5. Recent Labs  Lab 09/04/22 0955 09/08/22 1334 09/08/22 1910 09/09/22 0215 09/10/22 0709  AST 216*  --   --  261*  --   ALT 220*  --   --  233*  --   ALKPHOS 323*  --   --  220*  --   BILITOT 0.5  --   --  0.4  --  PROT 7.4  --   --  6.4*  --   ALBUMIN 3.9  --   --  3.0*  --   INR  --   --  1.3*  --  1.1  PLT 284 289  --  280 263    PAD, HLD PTA meds-Eliquis 5 mg twice daily, Lipitor 10 mg daily, cilostazol 100 mg twice daily. Currently on heparin drip. Continue cilostazol. Lipitor on hold because of elevated LFTs.  Hypokalemia Potassium level low as below.  Replacement given Recent Labs  Lab 09/04/22 0955 09/08/22 1334 09/09/22 0215 09/10/22 0709  K 3.9  3.4* 3.3* 4.5  MG  --   --  1.9  --    Chronic everyday smoker Counseled to quit.  Nicotine patch offered  Bilateral atelectasis Probably because of low activity since chemotherapy.  Encourage ambulation.  Incentive spirometry, flutter valve. No fever, no leukocytosis. With suspicion of pneumonia, 1 dose of antibiotic was given in the ED.  Will monitor off antibiotics at this time. Recent Labs  Lab 09/04/22 0955 09/08/22 1334 09/09/22 0215 09/10/22 0709  WBC 6.1 7.3 6.7 6.6   Mobility: PT eval  Goals of care   Code Status: Full Code    DVT prophylaxis: Heparin drip    Antimicrobials: No antibiotics Fluid: NS at 50 mill per hour Consultants: Cardiology, oncology Family Communication: None at bedside  Status: Inpatient Level of care:  Telemetry   Patient from: Home Anticipated d/c to: Pending clinical course Needs to continue in-hospital care:  Pending LHC tomorrow       Diet:  Diet Order             Diet NPO time specified  Diet effective midnight                   Scheduled Meds:  atorvastatin  20 mg Oral QHS   cilostazol  100 mg Oral BID   dronabinol  2.5 mg Oral BID AC   famotidine  20 mg Oral BID   metoprolol tartrate  25 mg Oral BID   umeclidinium bromide  1 puff Inhalation Daily    PRN meds: acetaminophen, albuterol, loperamide, ondansetron (ZOFRAN) IV, prochlorperazine   Infusions:   sodium chloride 50 mL/hr at 09/10/22 0905   heparin Stopped (09/10/22 0951)    Antimicrobials: Anti-infectives (From admission, onward)    Start     Dose/Rate Route Frequency Ordered Stop   09/08/22 1615  cefTRIAXone (ROCEPHIN) 1 g in sodium chloride 0.9 % 100 mL IVPB        1 g 200 mL/hr over 30 Minutes Intravenous  Once 09/08/22 1603 09/08/22 1716   09/08/22 1615  azithromycin (ZITHROMAX) 500 mg in sodium chloride 0.9 % 250 mL IVPB        500 mg 250 mL/hr over 60 Minutes Intravenous  Once 09/08/22 1603 09/08/22 1851       Nutritional status:   Body mass index is 22.84 kg/m.          Objective: Vitals:   09/10/22 0800 09/10/22 0832  BP:  (!) 133/93  Pulse:  80  Resp:  20  Temp:  98.2 F (36.8 C)  SpO2: 95% 100%    Intake/Output Summary (Last 24 hours) at 09/10/2022 1024 Last data filed at 09/09/2022 1838 Gross per 24 hour  Intake 443.33 ml  Output --  Net 443.33 ml   Filed Weights   09/09/22 0540 09/09/22 1317  Weight: 64.5 kg 64.2 kg   Weight  change: -0.3 kg Body mass index is 22.84 kg/m.   Physical Exam: General exam: Pleasant, elderly African-American female.  Not in distress Skin: No rashes, lesions or ulcers. HEENT: Atraumatic, normocephalic, no obvious bleeding Lungs: Clear to auscultation bilaterally CVS: Regular rate and rhythm, no murmur GI/Abd soft, nontender, nondistended, bowel sound present CNS: Alert, awake, oriented x 3 Psychiatry: Mood appropriate Extremities: No pedal edema, no calf tenderness  Data Review: I have personally reviewed the laboratory data and studies available.  F/u labs ordered Unresulted Labs (From admission, onward)     Start     Ordered   09/11/22 0500  Comprehensive metabolic panel  Tomorrow morning,   R        09/10/22 0738   09/10/22 1700  APTT  Once-Timed,   TIMED        09/10/22 0848   09/10/22 0500  Heparin level (unfractionated)  Daily,   R      09/09/22 1437   09/09/22 0230  CBC  Daily,   R      09/09/22 0205            Total time spent in review of labs and imaging, patient evaluation, formulation of plan, documentation and communication with family: 45 minutes  Signed, Lorin Glass, MD [facility] 09/10/2022

## 2022-09-11 ENCOUNTER — Inpatient Hospital Stay: Payer: Medicare HMO

## 2022-09-11 ENCOUNTER — Inpatient Hospital Stay: Payer: Medicare HMO | Admitting: Hematology

## 2022-09-11 ENCOUNTER — Encounter (HOSPITAL_COMMUNITY): Payer: Self-pay | Admitting: Cardiology

## 2022-09-11 DIAGNOSIS — E7800 Pure hypercholesterolemia, unspecified: Secondary | ICD-10-CM | POA: Diagnosis not present

## 2022-09-11 DIAGNOSIS — I2510 Atherosclerotic heart disease of native coronary artery without angina pectoris: Secondary | ICD-10-CM

## 2022-09-11 DIAGNOSIS — I2489 Other forms of acute ischemic heart disease: Secondary | ICD-10-CM | POA: Diagnosis not present

## 2022-09-11 DIAGNOSIS — I214 Non-ST elevation (NSTEMI) myocardial infarction: Secondary | ICD-10-CM | POA: Diagnosis not present

## 2022-09-11 MED ORDER — MELATONIN 5 MG PO TABS
5.0000 mg | ORAL_TABLET | Freq: Every evening | ORAL | Status: DC | PRN
  Administered 2022-09-11 (×2): 5 mg via ORAL
  Filled 2022-09-11 (×2): qty 1

## 2022-09-11 NOTE — TOC Progression Note (Signed)
Transition of Care [facility]) - Progression Note    Patient Details  Name: Christine Hurley MRN: [id number] Date of Birth: 15-Apr-1950  Transition of Care [facility]) CM/SW Contact  Howell Rucks, RN Phone Number: 09/11/2022, 12:37 PM  Clinical Narrative:  PT eval completed, no follow up recommendation. TOC will continue to follow.      Expected Discharge Plan: Home/Self Care Barriers to Discharge: No Barriers Identified  Expected Discharge Plan and Services       Living arrangements for the past 2 months: Single Family Home                                       Social Determinants of Health (SDOH) Interventions SDOH Screenings   Food Insecurity: No Food Insecurity (09/08/2022)  Housing: Low Risk  (09/08/2022)  Transportation Needs: No Transportation Needs (09/08/2022)  Utilities: Not At Risk (09/08/2022)  Depression (PHQ2-9): Low Risk  (07/28/2022)  Financial Resource Strain: Low Risk  (03/13/2022)  Physical Activity: Inactive (03/13/2022)  Social Connections: Moderately Isolated (03/13/2022)  Stress: No Stress Concern Present (03/13/2022)  Tobacco Use: High Risk (09/08/2022)    Readmission Risk Interventions     No data to display

## 2022-09-11 NOTE — Progress Notes (Signed)
Rounding Note    Patient Name: Christine Hurley Date of Encounter: 09/11/2022  [facility] Cardiologist: Chilton Si, MD   Subjective   Feeling better.  Diarrhea slowing down.  No CP.  Inpatient Medications    Scheduled Meds:  apixaban  5 mg Oral BID   atorvastatin  20 mg Oral QHS   cilostazol  100 mg Oral BID   dronabinol  2.5 mg Oral BID AC   famotidine  20 mg Oral BID   loperamide  2 mg Oral TID   meclizine  12.5 mg Oral TID   metoprolol tartrate  25 mg Oral BID   sodium chloride flush  3 mL Intravenous Q12H   umeclidinium bromide  1 puff Inhalation Daily   Continuous Infusions:  sodium chloride     PRN Meds: sodium chloride, acetaminophen, albuterol, loperamide, melatonin, ondansetron (ZOFRAN) IV, prochlorperazine, sodium chloride flush   Vital Signs    Vitals:   09/10/22 1823 09/10/22 2057 09/11/22 0206 09/11/22 0645  BP: 128/79 139/81 134/82   Pulse: 79 84 78   Resp: (!) 24 18    Temp: 98 F (36.7 C) 98.3 F (36.8 C) 97.7 F (36.5 C)   TempSrc: Oral Oral Oral   SpO2: 100% 100% 100% 93%  Weight:      Height:        Intake/Output Summary (Last 24 hours) at 09/11/2022 0839 Last data filed at 09/10/2022 2059 Gross per 24 hour  Intake 1658.48 ml  Output --  Net 1658.48 ml      09/09/2022    1:17 PM 09/09/2022    5:40 AM 09/04/2022    9:36 AM  Last 3 Weights  Weight (lbs) 141 lb 8.6 oz 142 lb 3.2 oz 143 lb  Weight (kg) 64.2 kg 64.5 kg 64.864 kg      Telemetry    Sinus rhythm. - Personally Reviewed  ECG    N/a - Personally Reviewed  Physical Exam   GEN: No acute distress.   Neck: No JVD Cardiac: RRR, no murmurs, rubs, or gallops.  Respiratory: Clear to auscultation bilaterally. GI: Soft, nontender, non-distended  MS: No edema; No deformity. Neuro:  Nonfocal  Psych: Normal affect   Labs    High Sensitivity Troponin:   Recent Labs  Lab 09/08/22 1334 09/08/22 1728 09/08/22 2057 09/08/22 2140 09/09/22 0215   TROPONINIHS 370* 414* 467* 472* 483*     Chemistry Recent Labs  Lab 09/04/22 0955 09/08/22 1334 09/09/22 0215 09/10/22 0709 09/11/22 0500  NA 137   < > 135 137 136  K 3.9   < > 3.3* 4.5 3.6  CL 106   < > 108 107 105  CO2 23   < > 18* 21* 20*  GLUCOSE 95   < > 109* 101* 96  BUN 18   < > 24* 22 19  CREATININE 0.71   < > 0.86 0.69 0.62  CALCIUM 9.5   < > 8.4* 9.0 8.9  MG  --   --  1.9  --   --   PROT 7.4  --  6.4*  --  7.5  ALBUMIN 3.9  --  3.0*  --  3.4*  AST 216*  --  261*  --  319*  ALT 220*  --  233*  --  332*  ALKPHOS 323*  --  220*  --  236*  BILITOT 0.5  --  0.4  --  0.9  GFRNONAA >60   < > >60 >60 >  60  ANIONGAP 8   < > 9 9 11    < > = values in this interval not displayed.    Lipids  Recent Labs  Lab 09/09/22 0215  CHOL 149  TRIG 71  HDL 52  LDLCALC 83  CHOLHDL 2.9    Hematology Recent Labs  Lab 09/09/22 0215 09/10/22 0709 09/11/22 0500  WBC 6.7 6.6 7.3  RBC 3.68* 3.90 4.22  HGB 10.8* 11.6* 12.5  HCT 32.7* 36.1 38.5  MCV 88.9 92.6 91.2  MCH 29.3 29.7 29.6  MCHC 33.0 32.1 32.5  RDW 15.4 15.7* 15.4  PLT 280 263 269   Thyroid No results for input(s): "TSH", "FREET4" in the last 168 hours.  BNP Recent Labs  Lab 09/08/22 1353  BNP 91.1    DDimer No results for input(s): "DDIMER" in the last 168 hours.   Radiology    CARDIAC CATHETERIZATION  Result Date: 09/10/2022 Mild nonobstructive CAD Normal LVEDP 7 mm Hg Plan: medical management  ECHOCARDIOGRAM COMPLETE  Result Date: 09/09/2022    ECHOCARDIOGRAM REPORT   [name] Date of Exam: 09/09/2022 Medical Rec #:  [id number]       Height:       66.0 in Accession #:    [id number]      Weight:       142.2 lb Date of Birth:  02-03-1951       BSA:          1.730 m Patient Age:    72 years        BP:           101/78 mmHg Patient Gender: F               HR:           81 bpm. Exam Location:  Inpatient Procedure: 2D Echo, Cardiac Doppler and Color Doppler Indications:    Chest Pain   History:        Patient has prior history of Echocardiogram examinations, most                 recent 11/24/2015. COPD; Risk Factors:Dyslipidemia and                 Hypertension.  Sonographer:    Harriette Bouillon RDCS Referring Phys: [id number] ANGELA NICOLE DUKE IMPRESSIONS  1. Technically difficult study with poor visualization of cardiac structures.  2. Left ventricular ejection fraction, by estimation, is 60 to 65%. The left ventricle has normal function. The left ventricle has no regional wall motion abnormalities.  3. Right ventricular systolic function is normal. The right ventricular size is normal. There is normal pulmonary artery systolic pressure.  4. The mitral valve is normal in structure. No evidence of mitral valve regurgitation. No evidence of mitral stenosis.  5. The aortic valve is normal in structure. There is mild calcification of the aortic valve. Aortic valve regurgitation is trivial. No aortic stenosis is present.  6. The inferior vena cava is normal in size with greater than 50% respiratory variability, suggesting right atrial pressure of 3 mmHg. FINDINGS  Left Ventricle: Left ventricular ejection fraction, by estimation, is 60 to 65%. The left ventricle has normal function. The left ventricle has no regional wall motion abnormalities. The left ventricular internal cavity size was normal in size. There is  no left ventricular hypertrophy. Left ventricular diastolic function could not be evaluated due to nondiagnostic images. Right Ventricle: The right ventricular size is normal. No increase  in right ventricular wall thickness. Right ventricular systolic function is normal. There is normal pulmonary artery systolic pressure. The tricuspid regurgitant velocity is 2.08 m/s, and  with an assumed right atrial pressure of 3 mmHg, the estimated right ventricular systolic pressure is 20.3 mmHg. Left Atrium: Left atrial size was normal in size. Right Atrium: Right atrial size was normal in size.  Pericardium: There is no evidence of pericardial effusion. Mitral Valve: The mitral valve is normal in structure. No evidence of mitral valve regurgitation. No evidence of mitral valve stenosis. Tricuspid Valve: The tricuspid valve is normal in structure. Tricuspid valve regurgitation is trivial. No evidence of tricuspid stenosis. Aortic Valve: The aortic valve is normal in structure. There is mild calcification of the aortic valve. Aortic valve regurgitation is trivial. No aortic stenosis is present. Pulmonic Valve: The pulmonic valve was normal in structure. Pulmonic valve regurgitation is not visualized. No evidence of pulmonic stenosis. Aorta: The aortic root is normal in size and structure. Venous: The inferior vena cava is normal in size with greater than 50% respiratory variability, suggesting right atrial pressure of 3 mmHg. IAS/Shunts: No atrial level shunt detected by color flow Doppler.  LEFT VENTRICLE PLAX 2D LVIDd:         4.10 cm   Diastology LVIDs:         2.87 cm   LV e' medial:    5.44 cm/s LV IVS:        0.90 cm   LV E/e' medial:  8.5 LVOT diam:     1.70 cm   LV e' lateral:   5.11 cm/s LV SV:         46        LV E/e' lateral: 9.1 LV SV Index:   27 LVOT Area:     2.27 cm  RIGHT VENTRICLE            IVC RV S prime:     9.25 cm/s  IVC diam: 1.70 cm TAPSE (M-mode): 1.5 cm LEFT ATRIUM             Index        RIGHT ATRIUM          Index LA diam:        3.00 cm 1.73 cm/m   RA Area:     6.80 cm LA Vol (A2C):   20.4 ml 11.79 ml/m  RA Volume:   12.50 ml 7.23 ml/m LA Vol (A4C):   25.0 ml 14.45 ml/m LA Biplane Vol: 23.6 ml 13.64 ml/m  AORTIC VALVE LVOT Vmax:   136.00 cm/s LVOT Vmean:  79.300 cm/s LVOT VTI:    0.202 m  AORTA Ao Root diam: 3.10 cm MITRAL VALVE               TRICUSPID VALVE MV Area (PHT): 2.48 cm    TR Peak grad:   17.3 mmHg MV Decel Time: 306 msec    TR Vmax:        208.00 cm/s MV E velocity: 46.50 cm/s MV A velocity: 90.30 cm/s  SHUNTS MV E/A ratio:  0.51        Systemic VTI:  0.20 m                             Systemic Diam: 1.70 cm Aditya Sabharwal Electronically signed by Dorthula Nettles Signature Date/Time: 09/09/2022/2:15:48 PM    Final    US Abdomen Limited RUQ (LIVER/GB)  Result Date: 09/09/2022 CLINICAL DATA:  Liver masses.  Elevated liver function tests EXAM: ULTRASOUND ABDOMEN LIMITED RIGHT UPPER QUADRANT COMPARISON:  MRI 07/19/2022 FINDINGS: Gallbladder: Distended gallbladder. There is wall thickening and edema. Some sludge. Echogenic shadowing along the wall focally. Common bile duct: Diameter: 5 mm Liver: Diffusely heterogeneous with multiple mass lesions better seen on the prior examinations measuring up to 5.5 cm. On prior study there was thrombus extending into the portal vein. Other: Mild ascites IMPRESSION: Multiple liver masses as seen on multiple prior examinations with the occlusion of the portal vein. Dilated gallbladder with wall thickening and edema and sludge. Echogenic shadowing along the wall focally. Some small dependent stones versus wall calcification is possible Electronically Signed   By: Karen Kays M.D.   On: 09/09/2022 12:04    Cardiac Studies   LHC 09/10/22: Mild nonobstructive CAD Normal LVEDP 7 mm Hg    Patient Profile     Ms. Reissig is a 55F with hepatocellular carcinoma on chemotherapy, portal vein thrombosis, hepatitis C, PAD, COPD, hypertension, and hyperlipidemia admitted with CP and +troponin.  Assessment & Plan    # Demand ischemia: # hyperlipidemia: Patient admitted with chest pain.  Cath lab was initially activated for STEMI.  STEMI criteria not met.  High-sensitivity troponin elevated to 370-->414-->467-->472-->483.  She is currently chest pain-free.  Her Eliquis has been held and she is on IV heparin.  EKG showed ST elevations different from EKGs 07/2022.  Also concern for Q waves.  However on cath she was found to have mild non-obstructive disease.  She has extensive coronary calcification on CT images.  Continue aspirin and  metoprolol.  LDL is not goal.  Increased atorvastatin to 20 mg.  Given her liver disease will not put her on high-dose statin therapy.   # Hepatocellular carcinoma: # Hepatitis C: # Portal vein thrombosis: Currently on treatment with Dr. [name].    Durvalumab and Imjudo, started treatment 08/15/2022.  Plan to be referred for IR for targeted therapy.  No plan for any procedures in the near future.  She is not a candidate for liver resection or transplant.  She is now back on Eliquis.    # PAD: Continue aspirin, atorvastatin, and cilostazol.  Consider switching Eliquis to Xarelto given her history of PAD.   # Hypertension: Home amlodipine, HCTZ and lisinopril currently on hold.  Continue metoprolol.  Blood pressure stable.   [facility] will sign off.   Medication Recommendations:  Resume home antihypertensives as needed Other recommendations (labs, testing, etc):  lipids/cmp in 2-3 months Follow up as an outpatient:  as needed  For questions or updates, please contact [facility] Please consult [email or web address] for contact info under        Signed, Chilton Si, MD  09/11/2022, 8:39 AM

## 2022-09-11 NOTE — Care Management Important Message (Signed)
Important Message  Patient Details IM Letter given. Name: Christine Hurley MRN: [id number] Date of Birth: 1951/01/01   Medicare Important Message Given:  Yes     Caren Macadam 09/11/2022, 1:26 PM

## 2022-09-11 NOTE — Evaluation (Signed)
Physical Therapy Evaluation Patient Details Name: Christine Hurley MRN: [id number] DOB: July 24, 1950 Today's Date: 09/11/2022  History of Present Illness  28 female with hepatocellular carcinoma on chemotherapy, portal vein thrombosis, hepatitis C, PAD, COPD, hypertension, and hyperlipidemia and admitted 09/08/22 with chest pain and +troponin.  Cardiology following. Cardiac cath 09/10/22 was found to have mild non-obstructive disease  Clinical Impression  Pt admitted with above diagnosis.  Pt currently with functional limitations due to the deficits listed below (see PT Problem List). Pt will benefit from acute skilled PT to increase their independence and safety with mobility to allow discharge.  Pt assisted with ambulating however only tolerated short distance due to c/o generalized weakness, fatigue and dyspnea.  Pt agreeable to remain OOB in recliner end of session. Pt reports sitting upright more helpful for SOB, laying supine feels like "something heavy on my chest."  Pt lives with her fiance and has SPC and RW at home.  Pt declines f/u PT upon d/c however agreeable for acute PT to assist with mobilizing in preparation for d/c home.         If plan is discharge home, recommend the following:     Can travel by private vehicle        Equipment Recommendations None recommended by PT  Recommendations for Other Services       Functional Status Assessment Patient has had a recent decline in their functional status and demonstrates the ability to make significant improvements in function in a reasonable and predictable amount of time.     Precautions / Restrictions Precautions Precautions: Fall Precaution Comments: monitor HR      Mobility  Bed Mobility Overal bed mobility: Modified Independent                  Transfers Overall transfer level: Needs assistance Equipment used: Rolling walker (2 wheels) Transfers: Sit to/from Stand Sit to Stand: Min guard                 Ambulation/Gait Ambulation/Gait assistance: Min guard Gait Distance (Feet): 20 Feet Assistive device: Rolling walker (2 wheels) Gait Pattern/deviations: Step-through pattern, Decreased stride length       General Gait Details: cues for use of RW, distance limited by fatigue and dyspnea, SPO2 97% on room air; HR up to 117 bpm  Stairs            Wheelchair Mobility     Tilt Bed    Modified Rankin (Stroke Patients Only)       Balance                                             Pertinent Vitals/Pain Pain Assessment Pain Assessment: No/denies pain    Home Living Family/patient expects to be discharged to:: Private residence Living Arrangements: Spouse/significant other   Type of Home: House Home Access: Stairs to enter Entrance Stairs-Rails: Doctor, general practice of Steps: 3   Home Layout: One level Home Equipment: Cane - single Librarian, academic (2 wheels)      Prior Function Prior Level of Function : Independent/Modified Independent             Mobility Comments: using SPC       Hand Dominance        Extremity/Trunk Assessment        Lower Extremity Assessment Lower Extremity Assessment: Generalized weakness  Cervical / Trunk Assessment Cervical / Trunk Assessment: Normal  Communication   Communication: No difficulties  Cognition Arousal/Alertness: Awake/alert Behavior During Therapy: WFL for tasks assessed/performed Overall Cognitive Status: Within Functional Limits for tasks assessed                                          General Comments      Exercises     Assessment/Plan    PT Assessment Patient needs continued PT services  PT Problem List         PT Treatment Interventions Therapeutic activities;Functional mobility training;Gait training;DME instruction;Stair training;Therapeutic exercise;Patient/family education    PT Goals (Current goals can be found in the  Care Plan section)  Acute Rehab PT Goals PT Goal Formulation: With patient Time For Goal Achievement: 09/25/22 Potential to Achieve Goals: Good    Frequency       Co-evaluation               AM-PAC PT "6 Clicks" Mobility  Outcome Measure Help needed turning from your back to your side while in a flat bed without using bedrails?: A Little Help needed moving from lying on your back to sitting on the side of a flat bed without using bedrails?: A Little Help needed moving to and from a bed to a chair (including a wheelchair)?: A Little Help needed standing up from a chair using your arms (e.g., wheelchair or bedside chair)?: A Little Help needed to walk in hospital room?: A Little Help needed climbing 3-5 steps with a railing? : A Lot 6 Click Score: 17    End of Session Equipment Utilized During Treatment: Gait belt Activity Tolerance: Patient tolerated treatment well Patient left: in chair;with call bell/phone within reach Nurse Communication: Mobility status PT Visit Diagnosis: Difficulty in walking, not elsewhere classified (R26.2)    Time: 1610-9604 PT Time Calculation (min) (ACUTE ONLY): 15 min   Charges:   PT Evaluation $PT Eval Low Complexity: 1 Low   PT General Charges $$ ACUTE PT VISIT: 1 Visit        Christine Hurley, DPT Physical Therapist Acute Rehabilitation Services Office: [phone]   Christine Hurley [place] 09/11/2022, 1:04 PM

## 2022-09-11 NOTE — Progress Notes (Signed)
PROGRESS NOTE    Christine Hurley  QQV:[id number] DOB: 08-Mar-1950 DOA: 09/08/2022 PCP: Willow Ora, MD    Brief Narrative:  This 72 years old female with PMH significant for hepatocellular carcinoma currently on chemotherapy, hypertension, PAD, COPD, hep C, depression, received last chemotherapy on 08/15/2022.  He presented in the ED with complaints of progressively worsening shortness of breath, dizziness for a week he almost passed out on the day of presentation.  Patient also reported chronic diarrhea for last 1 month.  Initial troponin was 414 which trended upwards.  CTA chest ruled out pulmonary embolism but shows bibasilar atelectasis and coronary calcifications.  Patient was empirically started on IV antibiotic for suspected pneumonia.  After few hours in the ED patient started complaining about chest pain and shortness of breath.  Patient was started on heparin drip for suspected NSTEMI cardiology was consulted.  Admitted for further evaluation.  Assessment & Plan:   Principal Problem:   NSTEMI (non-ST elevated myocardial infarction) (HCC) Active Problems:   Chronic obstructive pulmonary disease (HCC)   Chronic hepatitis C without hepatic coma (HCC)   Cigarette nicotine dependence with nicotine-induced disorder   Essential hypertension   Mixed hyperlipidemia   PAD (peripheral artery disease) (HCC)   Hepatocellular carcinoma (HCC)   Hypokalemia   Tachycardia   Demand ischemia   CAD in native artery   Pure hypercholesterolemia   NSTEMI: Patient presented with chest pain, shortness of breath,  Troponin 370 >414 > 467 > 472 > 483 Coronary calcifications seen on CT scan of chest. EKG suspicious for ST elevation in V1 and V2. Cardiology consulted.  Initiated on heparin drip. Echocardiogram with preserved EF but anterior wall hypokinesis. Patient underwent left heart cath found to have nonobstructive coronary artery disease. Elevated Troponin were likely secondary to demand  ischemia. Continue aspirin, metoprolol, Lipitor 20 mg daily.  Chronic diarrhea : Patient reports chronic diarrhea for last few months.   She reports 3-4 episodes a day probably making her dehydrated and dizzy. C. difficile assay : antigen positive, toxin negative, PCR negative -carrier state.  No need of vancomycin Currently on as needed Imodium.    Postural dizziness: Reports postural hypotension. BP improved with IV fluids.   Orthostatic vital measurement last night was negative probably after hydration. Mild dizziness continues. Continue meclizine scheduled 12.5 mg 3 times daily for next 2 days. PTA meds-amlodipine 10 mg daily, lisinopril 20 mg daily, HCTZ 12.5 mg daily Home BP meds on hold. Currently blood pressure is controlled on metoprolol 25 mg twice daily Continue IV fluids.  Continue to monitor orthostatic vitals.   Elevated liver enzymes: Chronic hep C: History of HCC: Follow-up LFTs. Follows up with oncologist Dr. [name]. Last chemo 7/5.   PAD, HLD PTA meds-Eliquis 5 mg twice daily, Lipitor 10 mg daily, cilostazol 100 mg twice daily. Initiated on heparin drip. Continue cilostazol. Lipitor on hold because of elevated LFTs. Heparin  discontinued switched to Eliquis.   Hypokalemia Replaced. Continue to monitor.   Chronic everyday smoker Counseled to quit.  Nicotine patch offered.   Bilateral atelectasis: Probably because of low activity since chemotherapy.   Encourage ambulation.  Incentive spirometry, flutter valve. No fever, no leukocytosis. With suspicion of pneumonia, 1 dose of antibiotics was given in the ED.  Will monitor off antibiotics at this time.  DVT prophylaxis: Heparin IV Code Status:Full code Family Communication: No family at bed side. Disposition Plan:   Status is: Inpatient Remains inpatient appropriate because: Admitted for sepsis secondary to suspected pneumonia  and NSTEMI underwent left heart cath.  She is found to have nonobstructive coronary  artery disease.  Continued on antibiotics.     Consultants:  Cardiology  Procedures: [facility]  Antimicrobials:  Anti-infectives (From admission, onward)    Start     Dose/Rate Route Frequency Ordered Stop   09/08/22 1615  cefTRIAXone (ROCEPHIN) 1 g in sodium chloride 0.9 % 100 mL IVPB        1 g 200 mL/hr over 30 Minutes Intravenous  Once 09/08/22 1603 09/08/22 1716   09/08/22 1615  azithromycin (ZITHROMAX) 500 mg in sodium chloride 0.9 % 250 mL IVPB        500 mg 250 mL/hr over 60 Minutes Intravenous  Once 09/08/22 1603 09/08/22 1851      Subjective: Patient was seen and examined at bedside.  Overnight events noted.   Patient reports doing better,  She denies any chest pain but still reports having mild loose watery stools.  Objective: Vitals:   09/11/22 0206 09/11/22 0645 09/11/22 1158 09/11/22 1309  BP: 134/82  (!) 138/98 (!) 142/85  Pulse: 78   83  Resp:   (!) 22 20  Temp: 97.7 F (36.5 C)  97.9 F (36.6 C) 98.1 F (36.7 C)  TempSrc: Oral  Oral Oral  SpO2: 100% 93% 99% 97%  Weight:      Height:        Intake/Output Summary (Last 24 hours) at 09/11/2022 1432 Last data filed at 09/10/2022 2059 Gross per 24 hour  Intake 440 ml  Output --  Net 440 ml   Filed Weights   09/09/22 0540 09/09/22 1317  Weight: 64.5 kg 64.2 kg    Examination:  General exam: Appears calm and comfortable, not in any acute distress Respiratory system: CTA bilaterally. Respiratory effort normal. RR 13 Cardiovascular system: S1 & S2 heard, RRR. No JVD, murmurs, rubs, gallops or clicks.  Gastrointestinal system: Abdomen is nondistended, soft and nontender. Normal bowel sounds heard. Central nervous system: Alert and oriented x 3. No focal neurological deficits. Extremities: No edema, No cyanosis, No clubbing Skin: No rashes, lesions or ulcers Psychiatry: Judgement and insight appear normal. Mood & affect appropriate.     Data Reviewed: I have personally reviewed following labs and  imaging studies  CBC: Recent Labs  Lab 09/08/22 1334 09/09/22 0215 09/10/22 0709 09/11/22 0500  WBC 7.3 6.7 6.6 7.3  NEUTROABS 6.0  --   --   --   HGB 12.3 10.8* 11.6* 12.5  HCT 37.3 32.7* 36.1 38.5  MCV 88.4 88.9 92.6 91.2  PLT 289 280 263 269   Basic Metabolic Panel: Recent Labs  Lab 09/08/22 1334 09/09/22 0215 09/10/22 0709 09/11/22 0500  NA 136 135 137 136  K 3.4* 3.3* 4.5 3.6  CL 106 108 107 105  CO2 20* 18* 21* 20*  GLUCOSE 108* 109* 101* 96  BUN 22 24* 22 19  CREATININE 0.81 0.86 0.69 0.62  CALCIUM 9.0 8.4* 9.0 8.9  MG  --  1.9  --   --    GFR: Estimated Creatinine Clearance: 59.5 mL/min (by C-G formula based on SCr of 0.62 mg/dL). Liver Function Tests: Recent Labs  Lab 09/09/22 0215 09/11/22 0500  AST 261* 319*  ALT 233* 332*  ALKPHOS 220* 236*  BILITOT 0.4 0.9  PROT 6.4* 7.5  ALBUMIN 3.0* 3.4*   No results for input(s): "LIPASE", "AMYLASE" in the last 168 hours. No results for input(s): "AMMONIA" in the last 168 hours. Coagulation Profile: Recent Labs  Lab 09/08/22 1910 09/10/22 0709  INR 1.3* 1.1   Cardiac Enzymes: No results for input(s): "CKTOTAL", "CKMB", "CKMBINDEX", "TROPONINI" in the last 168 hours. BNP (last 3 results) No results for input(s): "PROBNP" in the last 8760 hours. HbA1C: No results for input(s): "HGBA1C" in the last 72 hours. CBG: No results for input(s): "GLUCAP" in the last 168 hours. Lipid Profile: Recent Labs    09/09/22 0215  CHOL 149  HDL 52  LDLCALC 83  TRIG 71  CHOLHDL 2.9   Thyroid Function Tests: No results for input(s): "TSH", "T4TOTAL", "FREET4", "T3FREE", "THYROIDAB" in the last 72 hours. Anemia Panel: No results for input(s): "VITAMINB12", "FOLATE", "FERRITIN", "TIBC", "IRON", "RETICCTPCT" in the last 72 hours. Sepsis Labs: No results for input(s): "PROCALCITON", "LATICACIDVEN" in the last 168 hours.  Recent Results (from the past 240 hour(s))  SARS Coronavirus 2 by RT PCR (hospital order,  performed in [facility] hospital lab) *cepheid single result test* Anterior Nasal Swab     Status: None   Collection Time: 09/08/22  1:34 PM   Specimen: Anterior Nasal Swab  Result Value Ref Range Status   SARS Coronavirus 2 by RT PCR NEGATIVE NEGATIVE Final    Comment: (NOTE) SARS-CoV-2 target nucleic acids are NOT DETECTED.  The SARS-CoV-2 RNA is generally detectable in upper and lower respiratory specimens during the acute phase of infection. The lowest concentration of SARS-CoV-2 viral copies this assay can detect is 250 copies / mL. A negative result does not preclude SARS-CoV-2 infection and should not be used as the sole basis for treatment or other patient management decisions.  A negative result may occur with improper specimen collection / handling, submission of specimen other than nasopharyngeal swab, presence of viral mutation(s) within the areas targeted by this assay, and inadequate number of viral copies (<250 copies / mL). A negative result must be combined with clinical observations, patient history, and epidemiological information.  Fact Sheet for Patients:   RoadLapTop.co.za  Fact Sheet for Healthcare Providers: http://kim-miller.com/  This test is not yet approved or  cleared by the [place] FDA and has been authorized for detection and/or diagnosis of SARS-CoV-2 by FDA under an Emergency Use Authorization (EUA).  This EUA will remain in effect (meaning this test can be used) for the duration of the COVID-19 declaration under Section 564(b)(1) of the Act, 21 U.S.C. section 360bbb-3(b)(1), unless the authorization is terminated or revoked sooner.  Performed at [facility], [address] [place], [place] [address]   C Difficile Quick Screen w PCR reflex     Status: Abnormal   Collection Time: 09/09/22 12:42 PM   Specimen: STOOL  Result Value Ref Range Status   C Diff antigen POSITIVE  (A) NEGATIVE Final   C Diff toxin NEGATIVE NEGATIVE Final   C Diff interpretation Results are indeterminate. See PCR results.  Final    Comment: Performed at [facility], [address], [place], [place] [address]  C. Diff by PCR, Reflexed     Status: None   Collection Time: 09/09/22 12:42 PM  Result Value Ref Range Status   Toxigenic C. Difficile by PCR NEGATIVE NEGATIVE Final    Comment: Patient is colonized with non toxigenic C. difficile. May not need treatment unless significant symptoms are present. Performed at [facility], [address], [place], [place] [address]     Radiology Studies: CARDIAC CATHETERIZATION  Result Date: 09/10/2022 Mild nonobstructive CAD Normal LVEDP 7 mm Hg Plan: medical management  Scheduled Meds:  apixaban  5 mg Oral BID   atorvastatin  20 mg Oral QHS   cilostazol  100 mg Oral BID   dronabinol  2.5 mg Oral BID AC   famotidine  20 mg Oral BID   loperamide  2 mg Oral TID   meclizine  12.5 mg Oral TID   metoprolol tartrate  25 mg Oral BID   sodium chloride flush  3 mL Intravenous Q12H   umeclidinium bromide  1 puff Inhalation Daily   Continuous Infusions:  sodium chloride       LOS: 3 days    Time spent: 50 mins    Willeen Niece, MD [facility]   If 7PM-7AM, please contact night-coverage

## 2022-09-12 DIAGNOSIS — I214 Non-ST elevation (NSTEMI) myocardial infarction: Secondary | ICD-10-CM | POA: Diagnosis not present

## 2022-09-12 MED ORDER — ASPIRIN 81 MG PO TBEC: 81.0000 mg | DELAYED_RELEASE_TABLET | Freq: Every day | ORAL | 2 refills | Status: DC

## 2022-09-12 MED ORDER — METOPROLOL TARTRATE 25 MG PO TABS: 25.0000 mg | ORAL_TABLET | Freq: Two times a day (BID) | ORAL | 1 refills | Status: DC

## 2022-09-12 MED ORDER — ATORVASTATIN CALCIUM 20 MG PO TABS: 20.0000 mg | ORAL_TABLET | Freq: Every day | ORAL | 1 refills | Status: DC

## 2022-09-12 NOTE — Discharge Instructions (Addendum)
Advised to follow-up with primary care physician in 1 week. Advised to take aspirin, metoprolol and Lipitor 20 mg daily. Advised to continue Imodium as needed for diarrhea. Advised to follow-up with Oncology as scheduled.

## 2022-09-12 NOTE — Discharge Summary (Addendum)
Physician Discharge Summary  Christine Hurley ZOX:[id number] DOB: [birth year] DOA: 09/08/2022  PCP: Willow Ora, MD  Admit date: 09/08/2022  Discharge date: 09/12/2022  Admitted From: Home.  Disposition:  Home  Recommendations for Outpatient Follow-up:  Follow up with PCP in 1-2 weeks. Please obtain BMP/CBC in one week. Advised to take aspirin, metoprolol and Lipitor 20 mg daily. Advised to continue Imodium as needed for diarrhea. Advised to follow-up with Oncology as scheduled. Continue Eliquis for portal vein thrombosis.  Home Health:None Equipment/Devices:None  Discharge Condition: Stable CODE STATUS:Full code Diet recommendation: Heart Healthy   Brief [facility] Course: This 72 years old female with PMH significant for hepatocellular carcinoma currently on chemotherapy, hypertension, PAD, COPD, hep C, depression, received last chemotherapy on 08/15/2022. She presented in the ED with complaints of progressively worsening shortness of breath, dizziness for a week.  She almost passed out on the day of presentation.  Patient also reported chronic diarrhea for last 1 month.  Initial troponin was 414 which trended upwards.  CTA chest ruled out pulmonary embolism but showed bibasilar atelectasis and coronary calcifications.  Patient was empirically started on IV antibiotics for suspected pneumonia.  After few hours in the ED. Patient started complaining about chest pain and shortness of breath.  Patient was started on heparin drip for suspected NSTEMI,  Cardiology was consulted.  Admitted for further evaluation.  Troponin trended up.  Cardiology recommended left heart cath which showed mild nonobstructive coronary artery disease.  Cardiologist recommended to continue aspirin, Lipitor and metoprolol.  Heparin discontinued.  Patient feels much better,  diarrhea has improved. She has participated in physical therapy,  recommended home health services.  She wants to be discharged.  Patient is  being discharged home.  Discharge Diagnoses:  Principal Problem:   NSTEMI (non-ST elevated myocardial infarction) (HCC) Active Problems:   Chronic obstructive pulmonary disease (HCC)   Chronic hepatitis C without hepatic coma (HCC)   Cigarette nicotine dependence with nicotine-induced disorder   Essential hypertension   Mixed hyperlipidemia   PAD (peripheral artery disease) (HCC)   Hepatocellular carcinoma (HCC)   Hypokalemia   Tachycardia   Demand ischemia   CAD in native artery   Pure hypercholesterolemia  NSTEMI: Patient presented with chest pain, shortness of breath,  Troponin 370 >414 > 467 > 472 > 483 Coronary calcifications seen on CT scan of chest. EKG suspicious for ST elevation in V1 and V2. Cardiology consulted.  Initiated on heparin drip. Echocardiogram with preserved EF but anterior wall hypokinesis. Patient underwent left heart cath found to have nonobstructive coronary artery disease. Elevated Troponin were likely secondary to demand ischemia. Continue aspirin, metoprolol, Lipitor 20 mg daily. Cardiology signed off.   Chronic diarrhea : Patient reports chronic diarrhea for last few months.   She reports 3-4 episodes a day probably making her dehydrated and dizzy. C. difficile assay : antigen positive, toxin negative, PCR negative -carrier state.  No need of vancomycin Continue as needed Imodium.    Postural dizziness: Reports postural hypotension. BP improved with IV fluids.   Mild dizziness continues. Continue meclizine scheduled 12.5 mg 3 times daily for next 2 days. PTA meds-amlodipine 10 mg daily, lisinopril 20 mg daily, HCTZ 12.5 mg daily Home BP meds on hold. Currently blood pressure is controlled on metoprolol 25 mg twice daily Dizziness and orthostatic hypotension improved.   Elevated liver enzymes: Chronic hep C: History of HCC: Follow-up LFTs. Follows up with oncologist Dr. [name]. Last chemo 7/5.   PAD, HLD PTA meds-Eliquis 5  mg twice daily,  Lipitor 10 mg daily, cilostazol 100 mg twice daily. Initiated on heparin drip. Continue cilostazol. Lipitor on hold because of elevated LFTs. Heparin  discontinued and  switched to Eliquis.   Hypokalemia Replaced.Resolved.   Chronic everyday smoker Counseled to quit.  Nicotine patch offered.   Bilateral atelectasis: Probably because of low activity since chemotherapy.   Encourage ambulation.  Incentive spirometry, flutter valve. No fever, no leukocytosis. With suspicion of pneumonia, 1 dose of antibiotics was given in the ED.   Will monitor off antibiotics at this time.   Discharge Instructions  Discharge Instructions     Call MD for:  difficulty breathing, headache or visual disturbances   Complete by: As directed    Call MD for:  persistant dizziness or light-headedness   Complete by: As directed    Call MD for:  persistant nausea and vomiting   Complete by: As directed    Diet - low sodium heart healthy   Complete by: As directed    Diet Carb Modified   Complete by: As directed    Discharge instructions   Complete by: As directed    Advised to follow-up with primary care physician in 1 week. Advised to take aspirin, metoprolol and Lipitor 20 mg daily. Advised to continue Imodium as needed for diarrhea. Advised to follow-up with cardiology as scheduled.   Increase activity slowly   Complete by: As directed       Allergies as of 09/12/2022       Reactions   Famotidine    "Made me tired and weak"        Medication List     STOP taking these medications    dronabinol 2.5 MG capsule Commonly known as: MARINOL   famotidine 20 MG tablet Commonly known as: PEPCID   Imodium A-D 2 MG tablet Generic drug: loperamide   lisinopril-hydrochlorothiazide 20-12.5 MG tablet Commonly known as: ZESTORETIC   prochlorperazine 10 MG tablet Commonly known as: COMPAZINE       TAKE these medications    albuterol 108 (90 Base) MCG/ACT inhaler Commonly known as:  VENTOLIN HFA INHALE 1 TO 2 PUFFS INTO THE LUNGS EVERY 6 HOURS AS NEEDED FOR WHEEZING OR SHORTNESS OF BREATH What changed: See the new instructions.   amLODipine 10 MG tablet Commonly known as: NORVASC Take 1 tablet (10 mg total) by mouth daily.   apixaban 5 MG Tabs tablet Commonly known as: ELIQUIS Take 1 tablet (5mg ) by mouth twice daily What changed:  how much to take how to take this when to take this Another medication with the same name was removed. Continue taking this medication, and follow the directions you see here.   aspirin EC 81 MG tablet Take 1 tablet (81 mg total) by mouth daily. Swallow whole.   atorvastatin 20 MG tablet Commonly known as: LIPITOR Take 1 tablet (20 mg total) by mouth at bedtime. What changed:  medication strength See the new instructions.   cilostazol 100 MG tablet Commonly known as: PLETAL Take 100 mg by mouth 2 (two) times daily.   Geritol Complete Tabs Take 1 tablet by mouth daily with breakfast.   hydrocortisone 1 % ointment Apply 1 Application topically 2 (two) times daily. What changed:  when to take this additional instructions   metoprolol tartrate 25 MG tablet Commonly known as: LOPRESSOR Take 1 tablet (25 mg total) by mouth 2 (two) times daily.   Spiriva Respimat 2.5 MCG/ACT Aers Generic drug: Tiotropium Bromide Monohydrate INHALE 2  PUFFS INTO THE LUNGS DAILY What changed:  how much to take when to take this reasons to take this        Follow-up Information     Willow Ora, MD Follow up in 1 week(s).   Specialty: Family Medicine Contact information: [address] [place] [address] [phone]         Malachy Mood, MD Follow up in 1 week(s).   Specialties: Hematology, Oncology Contact information: [address] [place] [address] [phone]                Allergies  Allergen Reactions   Famotidine     "Made me tired and weak"     Consultations: Cardiology   Procedures/Studies: CARDIAC CATHETERIZATION  Result Date: 09/10/2022 Mild nonobstructive CAD Normal LVEDP 7 mm Hg Plan: medical management  ECHOCARDIOGRAM COMPLETE  Result Date: 09/09/2022    ECHOCARDIOGRAM REPORT   Patient Name:   SAHVANNA MCMANIGAL Date of Exam: 09/09/2022 Medical Rec #:  [id number]       Height:       66.0 in Accession #:    [id number]      Weight:       142.2 lb Date of Birth:  10/23/1950       BSA:          1.730 m Patient Age:    72 years        BP:           101/78 mmHg Patient Gender: F               HR:           81 bpm. Exam Location:  Inpatient Procedure: 2D Echo, Cardiac Doppler and Color Doppler Indications:    Chest Pain  History:        Patient has prior history of Echocardiogram examinations, most                 recent 11/24/2015. COPD; Risk Factors:Dyslipidemia and                 Hypertension.  Sonographer:    Harriette Bouillon RDCS Referring Phys: [id number] ANGELA NICOLE DUKE IMPRESSIONS  1. Technically difficult study with poor visualization of cardiac structures.  2. Left ventricular ejection fraction, by estimation, is 60 to 65%. The left ventricle has normal function. The left ventricle has no regional wall motion abnormalities.  3. Right ventricular systolic function is normal. The right ventricular size is normal. There is normal pulmonary artery systolic pressure.  4. The mitral valve is normal in structure. No evidence of mitral valve regurgitation. No evidence of mitral stenosis.  5. The aortic valve is normal in structure. There is mild calcification of the aortic valve. Aortic valve regurgitation is trivial. No aortic stenosis is present.  6. The inferior vena cava is normal in size with greater than 50% respiratory variability, suggesting right atrial pressure of 3 mmHg. FINDINGS  Left Ventricle: Left ventricular ejection fraction, by estimation, is 60 to 65%. The left ventricle has normal function. The left ventricle has no regional  wall motion abnormalities. The left ventricular internal cavity size was normal in size. There is  no left ventricular hypertrophy. Left ventricular diastolic function could not be evaluated due to nondiagnostic images. Right Ventricle: The right ventricular size is normal. No increase in right ventricular wall thickness. Right ventricular systolic function is normal. There is normal pulmonary artery systolic pressure. The tricuspid regurgitant velocity is 2.08  m/s, and  with an assumed right atrial pressure of 3 mmHg, the estimated right ventricular systolic pressure is 20.3 mmHg. Left Atrium: Left atrial size was normal in size. Right Atrium: Right atrial size was normal in size. Pericardium: There is no evidence of pericardial effusion. Mitral Valve: The mitral valve is normal in structure. No evidence of mitral valve regurgitation. No evidence of mitral valve stenosis. Tricuspid Valve: The tricuspid valve is normal in structure. Tricuspid valve regurgitation is trivial. No evidence of tricuspid stenosis. Aortic Valve: The aortic valve is normal in structure. There is mild calcification of the aortic valve. Aortic valve regurgitation is trivial. No aortic stenosis is present. Pulmonic Valve: The pulmonic valve was normal in structure. Pulmonic valve regurgitation is not visualized. No evidence of pulmonic stenosis. Aorta: The aortic root is normal in size and structure. Venous: The inferior vena cava is normal in size with greater than 50% respiratory variability, suggesting right atrial pressure of 3 mmHg. IAS/Shunts: No atrial level shunt detected by color flow Doppler.  LEFT VENTRICLE PLAX 2D LVIDd:         4.10 cm   Diastology LVIDs:         2.87 cm   LV e' medial:    5.44 cm/s LV IVS:        0.90 cm   LV E/e' medial:  8.5 LVOT diam:     1.70 cm   LV e' lateral:   5.11 cm/s LV SV:         46        LV E/e' lateral: 9.1 LV SV Index:   27 LVOT Area:     2.27 cm  RIGHT VENTRICLE            IVC RV S prime:      9.25 cm/s  IVC diam: 1.70 cm TAPSE (M-mode): 1.5 cm LEFT ATRIUM             Index        RIGHT ATRIUM          Index LA diam:        3.00 cm 1.73 cm/m   RA Area:     6.80 cm LA Vol (A2C):   20.4 ml 11.79 ml/m  RA Volume:   12.50 ml 7.23 ml/m LA Vol (A4C):   25.0 ml 14.45 ml/m LA Biplane Vol: 23.6 ml 13.64 ml/m  AORTIC VALVE LVOT Vmax:   136.00 cm/s LVOT Vmean:  79.300 cm/s LVOT VTI:    0.202 m  AORTA Ao Root diam: 3.10 cm MITRAL VALVE               TRICUSPID VALVE MV Area (PHT): 2.48 cm    TR Peak grad:   17.3 mmHg MV Decel Time: 306 msec    TR Vmax:        208.00 cm/s MV E velocity: 46.50 cm/s MV A velocity: 90.30 cm/s  SHUNTS MV E/A ratio:  0.51        Systemic VTI:  0.20 m                            Systemic Diam: 1.70 cm Aditya Sabharwal Electronically signed by Dorthula Nettles Signature Date/Time: 09/09/2022/2:15:48 PM    Final    US Abdomen Limited RUQ (LIVER/GB)  Result Date: 09/09/2022 CLINICAL DATA:  Liver masses.  Elevated liver function tests EXAM: ULTRASOUND ABDOMEN LIMITED RIGHT UPPER QUADRANT COMPARISON:  MRI 07/19/2022  FINDINGS: Gallbladder: Distended gallbladder. There is wall thickening and edema. Some sludge. Echogenic shadowing along the wall focally. Common bile duct: Diameter: 5 mm Liver: Diffusely heterogeneous with multiple mass lesions better seen on the prior examinations measuring up to 5.5 cm. On prior study there was thrombus extending into the portal vein. Other: Mild ascites IMPRESSION: Multiple liver masses as seen on multiple prior examinations with the occlusion of the portal vein. Dilated gallbladder with wall thickening and edema and sludge. Echogenic shadowing along the wall focally. Some small dependent stones versus wall calcification is possible Electronically Signed   By: Karen Kays M.D.   On: 09/09/2022 12:04   CT Angio Chest PE W and/or Wo Contrast  Result Date: 09/08/2022 CLINICAL DATA:  Pulmonary embolus EXAM: CT ANGIOGRAPHY CHEST WITH CONTRAST TECHNIQUE:  Multidetector CT imaging of the chest was performed using the standard protocol during bolus administration of intravenous contrast. Multiplanar CT image reconstructions and MIPs were obtained to evaluate the vascular anatomy. RADIATION DOSE REDUCTION: This exam was performed according to the departmental dose-optimization program which includes automated exposure control, adjustment of the mA and/or kV according to patient size and/or use of iterative reconstruction technique. CONTRAST:  75mL OMNIPAQUE IOHEXOL 350 MG/ML SOLN COMPARISON:  Chest CTA dated July 18, 2022; MRI abdomen dated July 19, 2018 FINDINGS: Cardiovascular: No evidence of pulmonary embolus, evaluation of the subsegmental pulmonary arteries is limited due to respiratory motion artifact. Normal heart size. Normal caliber thoracic aorta with severe atherosclerotic disease. Severe coronary artery calcifications. Mediastinum/Nodes: No enlarged lymph nodes seen in the chest. Esophagus and thyroid are unremarkable. Lungs/Pleura: Central airways are patent. Scattered lower lung linear opacities which are likely due to atelectasis. No pleural effusion or pneumothorax. Upper Abdomen: Partially visualized liver lesions, portal vein thrombus, and trace ascites, consistent with history of hepatocellular carcinoma. Unchanged left adrenal gland thickening. Musculoskeletal: No aggressive appearing osseous lesions. Review of the MIP images confirms the above findings. IMPRESSION: 1. No evidence of pulmonary embolus. 2. Bilateral atelectasis. 3. Redemonstrated liver lesions, portal vein thrombus, and trace ascites, consistent with history of hepatocellular carcinoma. 4. Aortic Atherosclerosis (ICD10-I70.0). Electronically Signed   By: Allegra Lai M.D.   On: 09/08/2022 16:51   DG Chest 2 View  Result Date: 09/08/2022 CLINICAL DATA:  Shortness of breath EXAM: CHEST - 2 VIEW COMPARISON:  04/23/2017 FINDINGS: The heart size and mediastinal contours are within  normal limits. Dependent bibasilar scarring or atelectasis. Disc degenerative disease of the thoracic spine. IMPRESSION: Dependent bibasilar scarring or atelectasis. Infection or aspiration not strictly excluded. Consider short interval follow-up radiographs. Electronically Signed   By: Jearld Lesch M.D.   On: 09/08/2022 15:32    Subjective: Patient was seen and examined at bedside.  Overnight events noted.   Patient reports doing much better and wants to be discharged.  Patient being discharged home.  Discharge Exam: Vitals:   09/12/22 0706 09/12/22 0849  BP: 116/70   Pulse: 88   Resp: 16   Temp: 98.1 F (36.7 C)   SpO2: 100% 98%   Vitals:   09/11/22 1309 09/11/22 2231 09/12/22 0706 09/12/22 0849  BP: (!) 142/85 117/75 116/70   Pulse: 83 80 88   Resp: 20 16 16    Temp: 98.1 F (36.7 C) 98.6 F (37 C) 98.1 F (36.7 C)   TempSrc: Oral Oral Oral   SpO2: 97% 100% 100% 98%  Weight:      Height:        General: Pt is alert, awake,  not in acute distress Cardiovascular: RRR, S1/S2 +, no rubs, no gallops Respiratory: CTA bilaterally, no wheezing, no rhonchi Abdominal: Soft, NT, ND, bowel sounds + Extremities: no edema, no cyanosis    The results of significant diagnostics from this hospitalization (including imaging, microbiology, ancillary and laboratory) are listed below for reference.     Microbiology: Recent Results (from the past 240 hour(s))  SARS Coronavirus 2 by RT PCR (hospital order, performed in [facility] hospital lab) *cepheid single result test* Anterior Nasal Swab     Status: None   Collection Time: 09/08/22  1:34 PM   Specimen: Anterior Nasal Swab  Result Value Ref Range Status   SARS Coronavirus 2 by RT PCR NEGATIVE NEGATIVE Final    Comment: (NOTE) SARS-CoV-2 target nucleic acids are NOT DETECTED.  The SARS-CoV-2 RNA is generally detectable in upper and lower respiratory specimens during the acute phase of infection. The lowest concentration of  SARS-CoV-2 viral copies this assay can detect is 250 copies / mL. A negative result does not preclude SARS-CoV-2 infection and should not be used as the sole basis for treatment or other patient management decisions.  A negative result may occur with improper specimen collection / handling, submission of specimen other than nasopharyngeal swab, presence of viral mutation(s) within the areas targeted by this assay, and inadequate number of viral copies (<250 copies / mL). A negative result must be combined with clinical observations, patient history, and epidemiological information.  Fact Sheet for Patients:   RoadLapTop.co.za  Fact Sheet for Healthcare Providers: http://kim-miller.com/  This test is not yet approved or  cleared by the [place] FDA and has been authorized for detection and/or diagnosis of SARS-CoV-2 by FDA under an Emergency Use Authorization (EUA).  This EUA will remain in effect (meaning this test can be used) for the duration of the COVID-19 declaration under Section 564(b)(1) of the Act, 21 U.S.C. section 360bbb-3(b)(1), unless the authorization is terminated or revoked sooner.  Performed at [facility], [address], [place], [place] [address]   C Difficile Quick Screen w PCR reflex     Status: Abnormal   Collection Time: 09/09/22 12:42 PM   Specimen: STOOL  Result Value Ref Range Status   C Diff antigen POSITIVE (A) NEGATIVE Final   C Diff toxin NEGATIVE NEGATIVE Final   C Diff interpretation Results are indeterminate. See PCR results.  Final    Comment: Performed at [facility], [address], [place], [place] [address]  C. Diff by PCR, Reflexed     Status: None   Collection Time: 09/09/22 12:42 PM  Result Value Ref Range Status   Toxigenic C. Difficile by PCR NEGATIVE NEGATIVE Final    Comment: Patient is colonized with non toxigenic C. difficile. May not need  treatment unless significant symptoms are present. Performed at [facility], [address], [place], [place] [address]      Labs: BNP (last 3 results) Recent Labs    09/08/22 1353  BNP 91.1   Basic Metabolic Panel: Recent Labs  Lab 09/08/22 1334 09/09/22 0215 09/10/22 0709 09/11/22 0500 09/12/22 0446  NA 136 135 137 136 133*  K 3.4* 3.3* 4.5 3.6 3.7  CL 106 108 107 105 104  CO2 20* 18* 21* 20* 20*  GLUCOSE 108* 109* 101* 96 104*  BUN 22 24* 22 19 19   CREATININE 0.81 0.86 0.69 0.62 0.64  CALCIUM 9.0 8.4* 9.0 8.9 8.4*  MG  --  1.9  --   --  2.0  PHOS  --   --   --   --  4.3   Liver Function Tests: Recent Labs  Lab 09/09/22 0215 09/11/22 0500 09/12/22 0446  AST 261* 319* 252*  ALT 233* 332* 245*  ALKPHOS 220* 236* 185*  BILITOT 0.4 0.9 1.0  PROT 6.4* 7.5 6.5  ALBUMIN 3.0* 3.4* 3.0*   No results for input(s): "LIPASE", "AMYLASE" in the last 168 hours. No results for input(s): "AMMONIA" in the last 168 hours. CBC: Recent Labs  Lab 09/08/22 1334 09/09/22 0215 09/10/22 0709 09/11/22 0500 09/12/22 0446  WBC 7.3 6.7 6.6 7.3 6.7  NEUTROABS 6.0  --   --   --   --   HGB 12.3 10.8* 11.6* 12.5 11.5*  HCT 37.3 32.7* 36.1 38.5 35.1*  MCV 88.4 88.9 92.6 91.2 90.7  PLT 289 280 263 269 235   Cardiac Enzymes: No results for input(s): "CKTOTAL", "CKMB", "CKMBINDEX", "TROPONINI" in the last 168 hours. BNP: Invalid input(s): "POCBNP" CBG: No results for input(s): "GLUCAP" in the last 168 hours. D-Dimer No results for input(s): "DDIMER" in the last 72 hours. Hgb A1c No results for input(s): "HGBA1C" in the last 72 hours. Lipid Profile No results for input(s): "CHOL", "HDL", "LDLCALC", "TRIG", "CHOLHDL", "LDLDIRECT" in the last 72 hours. Thyroid function studies No results for input(s): "TSH", "T4TOTAL", "T3FREE", "THYROIDAB" in the last 72 hours.  Invalid input(s): "FREET3" Anemia work up No results for input(s): "VITAMINB12", "FOLATE", "FERRITIN",  "TIBC", "IRON", "RETICCTPCT" in the last 72 hours. Urinalysis    Component Value Date/Time   COLORURINE STRAW (A) 07/18/2022 2042   APPEARANCEUR CLEAR 07/18/2022 2042   LABSPEC 1.012 07/18/2022 2042   PHURINE 7.0 07/18/2022 2042   GLUCOSEU NEGATIVE 07/18/2022 2042   HGBUR NEGATIVE 07/18/2022 2042   HGBUR negative 11/06/2009 0000   BILIRUBINUR NEGATIVE 07/18/2022 2042   KETONESUR 20 (A) 07/18/2022 2042   PROTEINUR 30 (A) 07/18/2022 2042   UROBILINOGEN 2.0 11/06/2009 0000   NITRITE NEGATIVE 07/18/2022 2042   LEUKOCYTESUR NEGATIVE 07/18/2022 2042   Sepsis Labs Recent Labs  Lab 09/09/22 0215 09/10/22 0709 09/11/22 0500 09/12/22 0446  WBC 6.7 6.6 7.3 6.7   Microbiology Recent Results (from the past 240 hour(s))  SARS Coronavirus 2 by RT PCR (hospital order, performed in [facility] hospital lab) *cepheid single result test* Anterior Nasal Swab     Status: None   Collection Time: 09/08/22  1:34 PM   Specimen: Anterior Nasal Swab  Result Value Ref Range Status   SARS Coronavirus 2 by RT PCR NEGATIVE NEGATIVE Final    Comment: (NOTE) SARS-CoV-2 target nucleic acids are NOT DETECTED.  The SARS-CoV-2 RNA is generally detectable in upper and lower respiratory specimens during the acute phase of infection. The lowest concentration of SARS-CoV-2 viral copies this assay can detect is 250 copies / mL. A negative result does not preclude SARS-CoV-2 infection and should not be used as the sole basis for treatment or other patient management decisions.  A negative result may occur with improper specimen collection / handling, submission of specimen other than nasopharyngeal swab, presence of viral mutation(s) within the areas targeted by this assay, and inadequate number of viral copies (<250 copies / mL). A negative result must be combined with clinical observations, patient history, and epidemiological information.  Fact Sheet for Patients:    RoadLapTop.co.za  Fact Sheet for Healthcare Providers: http://kim-miller.com/  This test is not yet approved or  cleared by the [place] FDA and has been authorized for  detection and/or diagnosis of SARS-CoV-2 by FDA under an Emergency Use Authorization (EUA).  This EUA will remain in effect (meaning this test can be used) for the duration of the COVID-19 declaration under Section 564(b)(1) of the Act, 21 U.S.C. section 360bbb-3(b)(1), unless the authorization is terminated or revoked sooner.  Performed at [facility], [address] [place], [place] [address]   C Difficile Quick Screen w PCR reflex     Status: Abnormal   Collection Time: 09/09/22 12:42 PM   Specimen: STOOL  Result Value Ref Range Status   C Diff antigen POSITIVE (A) NEGATIVE Final   C Diff toxin NEGATIVE NEGATIVE Final   C Diff interpretation Results are indeterminate. See PCR results.  Final    Comment: Performed at [facility], [address], [place], [place] [id number]  C. Diff by PCR, Reflexed     Status: None   Collection Time: 09/09/22 12:42 PM  Result Value Ref Range Status   Toxigenic C. Difficile by PCR NEGATIVE NEGATIVE Final    Comment: Patient is colonized with non toxigenic C. difficile. May not need treatment unless significant symptoms are present. Performed at [facility], [address] [place], [place] [address]      Time coordinating discharge: Over 30 minutes  SIGNED:   Willeen Niece, MD  [facility] 09/12/2022, 4:33 PM Pager   If 7PM-7AM, please contact night-coverage

## 2022-09-12 NOTE — Plan of Care (Signed)
  Problem: Activity: Goal: Ability to tolerate increased activity will improve Outcome: Progressing   Problem: Education: Goal: Knowledge of General Education information will improve Description: Including pain rating scale, medication(s)/side effects and non-pharmacologic comfort measures Outcome: Progressing   Problem: Clinical Measurements: Goal: Will remain free from infection Outcome: Progressing   Problem: Activity: Goal: Risk for activity intolerance will decrease Outcome: Progressing   Problem: Nutrition: Goal: Adequate nutrition will be maintained Outcome: Progressing   Problem: Coping: Goal: Level of anxiety will decrease Outcome: Progressing

## 2022-09-13 ENCOUNTER — Inpatient Hospital Stay (HOSPITAL_COMMUNITY)
Admission: EM | Admit: 2022-09-13 | Discharge: 2022-09-20 | DRG: 314 | Disposition: A | Discharge status: Home Health | Payer: Medicare HMO | Attending: Internal Medicine | Admitting: Internal Medicine

## 2022-09-13 ENCOUNTER — Inpatient Hospital Stay (HOSPITAL_COMMUNITY): Payer: Medicare HMO

## 2022-09-13 ENCOUNTER — Other Ambulatory Visit: Payer: Self-pay

## 2022-09-13 ENCOUNTER — Emergency Department (HOSPITAL_COMMUNITY): Payer: Medicare HMO

## 2022-09-13 ENCOUNTER — Encounter (HOSPITAL_COMMUNITY): Payer: Self-pay | Admitting: Emergency Medicine

## 2022-09-13 DIAGNOSIS — J189 Pneumonia, unspecified organism: Secondary | ICD-10-CM | POA: Diagnosis not present

## 2022-09-13 DIAGNOSIS — I2510 Atherosclerotic heart disease of native coronary artery without angina pectoris: Secondary | ICD-10-CM | POA: Diagnosis present

## 2022-09-13 DIAGNOSIS — E8770 Fluid overload, unspecified: Secondary | ICD-10-CM | POA: Diagnosis not present

## 2022-09-13 DIAGNOSIS — Z7984 Long term (current) use of oral hypoglycemic drugs: Secondary | ICD-10-CM | POA: Diagnosis not present

## 2022-09-13 DIAGNOSIS — R0789 Other chest pain: Secondary | ICD-10-CM | POA: Diagnosis not present

## 2022-09-13 DIAGNOSIS — C220 Liver cell carcinoma: Secondary | ICD-10-CM | POA: Diagnosis not present

## 2022-09-13 DIAGNOSIS — I252 Old myocardial infarction: Secondary | ICD-10-CM | POA: Diagnosis not present

## 2022-09-13 DIAGNOSIS — D849 Immunodeficiency, unspecified: Secondary | ICD-10-CM | POA: Diagnosis present

## 2022-09-13 DIAGNOSIS — Z1152 Encounter for screening for COVID-19: Secondary | ICD-10-CM

## 2022-09-13 DIAGNOSIS — I401 Isolated myocarditis: Secondary | ICD-10-CM | POA: Diagnosis not present

## 2022-09-13 DIAGNOSIS — I110 Hypertensive heart disease with heart failure: Secondary | ICD-10-CM | POA: Diagnosis present

## 2022-09-13 DIAGNOSIS — F419 Anxiety disorder, unspecified: Secondary | ICD-10-CM | POA: Diagnosis present

## 2022-09-13 DIAGNOSIS — I81 Portal vein thrombosis: Secondary | ICD-10-CM | POA: Diagnosis not present

## 2022-09-13 DIAGNOSIS — J90 Pleural effusion, not elsewhere classified: Secondary | ICD-10-CM | POA: Diagnosis not present

## 2022-09-13 DIAGNOSIS — I319 Disease of pericardium, unspecified: Secondary | ICD-10-CM | POA: Diagnosis present

## 2022-09-13 DIAGNOSIS — Z7901 Long term (current) use of anticoagulants: Secondary | ICD-10-CM | POA: Diagnosis not present

## 2022-09-13 DIAGNOSIS — Z7902 Long term (current) use of antithrombotics/antiplatelets: Secondary | ICD-10-CM | POA: Diagnosis not present

## 2022-09-13 DIAGNOSIS — Z79899 Other long term (current) drug therapy: Secondary | ICD-10-CM

## 2022-09-13 DIAGNOSIS — I5041 Acute combined systolic (congestive) and diastolic (congestive) heart failure: Secondary | ICD-10-CM

## 2022-09-13 DIAGNOSIS — I700 Atherosclerosis of aorta: Secondary | ICD-10-CM | POA: Diagnosis not present

## 2022-09-13 DIAGNOSIS — B182 Chronic viral hepatitis C: Secondary | ICD-10-CM | POA: Diagnosis present

## 2022-09-13 DIAGNOSIS — R918 Other nonspecific abnormal finding of lung field: Secondary | ICD-10-CM | POA: Diagnosis not present

## 2022-09-13 DIAGNOSIS — F17219 Nicotine dependence, cigarettes, with unspecified nicotine-induced disorders: Secondary | ICD-10-CM | POA: Diagnosis not present

## 2022-09-13 DIAGNOSIS — I517 Cardiomegaly: Secondary | ICD-10-CM | POA: Diagnosis not present

## 2022-09-13 DIAGNOSIS — J440 Chronic obstructive pulmonary disease with acute lower respiratory infection: Secondary | ICD-10-CM | POA: Diagnosis present

## 2022-09-13 DIAGNOSIS — K769 Liver disease, unspecified: Secondary | ICD-10-CM | POA: Diagnosis not present

## 2022-09-13 DIAGNOSIS — I409 Acute myocarditis, unspecified: Principal | ICD-10-CM | POA: Diagnosis present

## 2022-09-13 DIAGNOSIS — F32A Depression, unspecified: Secondary | ICD-10-CM | POA: Diagnosis present

## 2022-09-13 DIAGNOSIS — R0602 Shortness of breath: Secondary | ICD-10-CM | POA: Diagnosis not present

## 2022-09-13 DIAGNOSIS — R079 Chest pain, unspecified: Principal | ICD-10-CM

## 2022-09-13 DIAGNOSIS — I5031 Acute diastolic (congestive) heart failure: Secondary | ICD-10-CM | POA: Diagnosis not present

## 2022-09-13 DIAGNOSIS — I771 Stricture of artery: Secondary | ICD-10-CM | POA: Diagnosis not present

## 2022-09-13 DIAGNOSIS — R188 Other ascites: Secondary | ICD-10-CM | POA: Diagnosis present

## 2022-09-13 DIAGNOSIS — I514 Myocarditis, unspecified: Secondary | ICD-10-CM | POA: Diagnosis present

## 2022-09-13 DIAGNOSIS — Z7982 Long term (current) use of aspirin: Secondary | ICD-10-CM

## 2022-09-13 DIAGNOSIS — I408 Other acute myocarditis: Secondary | ICD-10-CM | POA: Diagnosis not present

## 2022-09-13 DIAGNOSIS — I739 Peripheral vascular disease, unspecified: Secondary | ICD-10-CM | POA: Diagnosis present

## 2022-09-13 DIAGNOSIS — K529 Noninfective gastroenteritis and colitis, unspecified: Secondary | ICD-10-CM | POA: Diagnosis present

## 2022-09-13 DIAGNOSIS — Z825 Family history of asthma and other chronic lower respiratory diseases: Secondary | ICD-10-CM | POA: Diagnosis not present

## 2022-09-13 DIAGNOSIS — T451X5A Adverse effect of antineoplastic and immunosuppressive drugs, initial encounter: Secondary | ICD-10-CM | POA: Diagnosis present

## 2022-09-13 DIAGNOSIS — R1310 Dysphagia, unspecified: Secondary | ICD-10-CM | POA: Diagnosis present

## 2022-09-13 DIAGNOSIS — I358 Other nonrheumatic aortic valve disorders: Secondary | ICD-10-CM | POA: Diagnosis present

## 2022-09-13 DIAGNOSIS — J9811 Atelectasis: Secondary | ICD-10-CM | POA: Diagnosis not present

## 2022-09-13 DIAGNOSIS — I213 ST elevation (STEMI) myocardial infarction of unspecified site: Secondary | ICD-10-CM | POA: Diagnosis not present

## 2022-09-13 DIAGNOSIS — E8779 Other fluid overload: Secondary | ICD-10-CM | POA: Diagnosis not present

## 2022-09-13 DIAGNOSIS — Z888 Allergy status to other drugs, medicaments and biological substances status: Secondary | ICD-10-CM

## 2022-09-13 DIAGNOSIS — R0989 Other specified symptoms and signs involving the circulatory and respiratory systems: Secondary | ICD-10-CM | POA: Diagnosis not present

## 2022-09-13 LAB — CBC WITH DIFFERENTIAL/PLATELET
Abs Immature Granulocytes: 0.04 10*3/uL (ref 0.00–0.07)
Basophils Absolute: 0 10*3/uL (ref 0.0–0.1)
Basophils Relative: 0 %
Eosinophils Absolute: 0 10*3/uL (ref 0.0–0.5)
Eosinophils Relative: 1 %
HCT: 37.2 % (ref 36.0–46.0)
Hemoglobin: 12.3 g/dL (ref 12.0–15.0)
Immature Granulocytes: 1 %
Lymphocytes Relative: 8 %
Lymphs Abs: 0.6 10*3/uL — ABNORMAL LOW (ref 0.7–4.0)
MCH: 29.5 pg (ref 26.0–34.0)
MCHC: 33.1 g/dL (ref 30.0–36.0)
MCV: 89.2 fL (ref 80.0–100.0)
Monocytes Absolute: 0.4 10*3/uL (ref 0.1–1.0)
Monocytes Relative: 6 %
Neutro Abs: 6.8 10*3/uL (ref 1.7–7.7)
Neutrophils Relative %: 84 %
Platelets: 307 10*3/uL (ref 150–400)
RBC: 4.17 MIL/uL (ref 3.87–5.11)
RDW: 15.7 % — ABNORMAL HIGH (ref 11.5–15.5)
WBC: 8 10*3/uL (ref 4.0–10.5)
nRBC: 0 % (ref 0.0–0.2)

## 2022-09-13 LAB — PROTIME-INR
INR: 1.4 — ABNORMAL HIGH (ref 0.8–1.2)
Prothrombin Time: 17.5 seconds — ABNORMAL HIGH (ref 11.4–15.2)

## 2022-09-13 LAB — COMPREHENSIVE METABOLIC PANEL
ALT: 263 U/L — ABNORMAL HIGH (ref 0–44)
AST: 288 U/L — ABNORMAL HIGH (ref 15–41)
Albumin: 3.3 g/dL — ABNORMAL LOW (ref 3.5–5.0)
Alkaline Phosphatase: 198 U/L — ABNORMAL HIGH (ref 38–126)
Anion gap: 9 (ref 5–15)
BUN: 20 mg/dL (ref 8–23)
CO2: 22 mmol/L (ref 22–32)
Calcium: 8.9 mg/dL (ref 8.9–10.3)
Chloride: 101 mmol/L (ref 98–111)
Creatinine, Ser: 0.69 mg/dL (ref 0.44–1.00)
GFR, Estimated: >60 mL/min (ref >60)
Glucose, Bld: 109 mg/dL — ABNORMAL HIGH (ref 70–99)
Potassium: 4.1 mmol/L (ref 3.5–5.1)
Sodium: 132 mmol/L — ABNORMAL LOW (ref 135–145)
Total Bilirubin: 0.8 mg/dL (ref 0.3–1.2)
Total Protein: 7.5 g/dL (ref 6.5–8.1)

## 2022-09-13 LAB — BLOOD GAS, ARTERIAL
Acid-base deficit: 8.1 mmol/L — ABNORMAL HIGH (ref 0.0–2.0)
Bicarbonate: 15.1 mmol/L — ABNORMAL LOW (ref 20.0–28.0)
Drawn by: 548871
O2 Saturation: 98.8 %
Patient temperature: 37
pCO2 arterial: 25 mmHg — ABNORMAL LOW (ref 32–48)
pH, Arterial: 7.39 (ref 7.35–7.45)
pO2, Arterial: 119 mmHg — ABNORMAL HIGH (ref 83–108)

## 2022-09-13 LAB — C-REACTIVE PROTEIN: CRP: 0.7 mg/dL (ref <1.0)

## 2022-09-13 LAB — TROPONIN I (HIGH SENSITIVITY)
Troponin I (High Sensitivity): 1521 ng/L (ref <18)
Troponin I (High Sensitivity): 994 ng/L (ref <18)

## 2022-09-13 LAB — ECHOCARDIOGRAM LIMITED
Height: 66 in
S' Lateral: 2.6 cm
Weight: 2240 oz

## 2022-09-13 LAB — HEPARIN LEVEL (UNFRACTIONATED): Heparin Unfractionated: >1.1 IU/mL — ABNORMAL HIGH (ref 0.30–0.70)

## 2022-09-13 LAB — LIPID PANEL
Cholesterol: 159 mg/dL (ref 0–200)
HDL: 53 mg/dL (ref >40)
LDL Cholesterol: 87 mg/dL (ref 0–99)
Total CHOL/HDL Ratio: 3 RATIO
Triglycerides: 95 mg/dL (ref <150)
VLDL: 19 mg/dL (ref 0–40)

## 2022-09-13 LAB — I-STAT CHEM 8, ED
BUN: 19 mg/dL (ref 8–23)
Calcium, Ion: 0.92 mmol/L — ABNORMAL LOW (ref 1.15–1.40)
Chloride: 106 mmol/L (ref 98–111)
Creatinine, Ser: 0.5 mg/dL (ref 0.44–1.00)
Glucose, Bld: 93 mg/dL (ref 70–99)
HCT: 32 % — ABNORMAL LOW (ref 36.0–46.0)
Hemoglobin: 10.9 g/dL — ABNORMAL LOW (ref 12.0–15.0)
Potassium: 4.1 mmol/L (ref 3.5–5.1)
Sodium: 135 mmol/L (ref 135–145)
TCO2: 18 mmol/L — ABNORMAL LOW (ref 22–32)

## 2022-09-13 LAB — GLUCOSE, CAPILLARY
Glucose-Capillary: 162 mg/dL — ABNORMAL HIGH (ref 70–99)
Glucose-Capillary: 139 mg/dL — ABNORMAL HIGH (ref 70–99)

## 2022-09-13 LAB — APTT
aPTT: 31 seconds (ref 24–36)
aPTT: 57 seconds — ABNORMAL HIGH (ref 24–36)

## 2022-09-13 LAB — I-STAT CG4 LACTIC ACID, ED: Lactic Acid, Venous: 1.3 mmol/L (ref 0.5–1.9)

## 2022-09-13 LAB — SEDIMENTATION RATE: Sed Rate: 56 mm/hr — ABNORMAL HIGH (ref 0–22)

## 2022-09-13 LAB — CK: Total CK: 2683 U/L — ABNORMAL HIGH (ref 38–234)

## 2022-09-13 LAB — BRAIN NATRIURETIC PEPTIDE: B Natriuretic Peptide: 278.5 pg/mL — ABNORMAL HIGH (ref 0.0–100.0)

## 2022-09-13 LAB — HEMOGLOBIN A1C
Hgb A1c MFr Bld: 6.2 % — ABNORMAL HIGH (ref 4.8–5.6)
Mean Plasma Glucose: 131.24 mg/dL

## 2022-09-13 MED ORDER — NICOTINE POLACRILEX 2 MG MT GUM
2.0000 mg | CHEWING_GUM | OROMUCOSAL | Status: DC | PRN
  Filled 2022-09-13: qty 1

## 2022-09-13 MED ORDER — ATORVASTATIN CALCIUM 10 MG PO TABS
20.0000 mg | ORAL_TABLET | Freq: Every day | ORAL | Status: DC
  Administered 2022-09-13: 20 mg via ORAL
  Filled 2022-09-13: qty 2

## 2022-09-13 MED ORDER — FUROSEMIDE 10 MG/ML IJ SOLN
40.0000 mg | Freq: Once | INTRAMUSCULAR | Status: AC
  Administered 2022-09-13: 40 mg via INTRAVENOUS
  Filled 2022-09-13: qty 4

## 2022-09-13 MED ORDER — ACETAMINOPHEN 325 MG PO TABS: 650.0000 mg | ORAL_TABLET | Freq: Four times a day (QID) | ORAL | Status: DC | PRN

## 2022-09-13 MED ORDER — METOPROLOL TARTRATE 25 MG PO TABS
25.0000 mg | ORAL_TABLET | Freq: Two times a day (BID) | ORAL | Status: DC
  Administered 2022-09-13 – 2022-09-15 (×5): 25 mg via ORAL
  Filled 2022-09-13 (×7): qty 1

## 2022-09-13 MED ORDER — HEPARIN (PORCINE) 25000 UT/250ML-% IV SOLN
1000.0000 [IU]/h | INTRAVENOUS | Status: DC
  Filled 2022-09-13: qty 250

## 2022-09-13 MED ORDER — ALBUTEROL SULFATE (2.5 MG/3ML) 0.083% IN NEBU
3.0000 mL | INHALATION_SOLUTION | Freq: Four times a day (QID) | RESPIRATORY_TRACT | Status: DC | PRN
  Administered 2022-09-15 – 2022-09-17 (×3): 3 mL via RESPIRATORY_TRACT
  Filled 2022-09-13 (×3): qty 3

## 2022-09-13 MED ORDER — POLYETHYLENE GLYCOL 3350 17 G PO PACK: 17.0000 g | PACK | Freq: Every day | ORAL | Status: DC | PRN

## 2022-09-13 MED ORDER — SODIUM CHLORIDE 0.9 % IV SOLN
1000.0000 mg | Freq: Once | INTRAVENOUS | Status: AC
  Administered 2022-09-13: 1000 mg via INTRAVENOUS
  Filled 2022-09-13: qty 16

## 2022-09-13 MED ORDER — HEPARIN SODIUM (PORCINE) 5000 UNIT/ML IJ SOLN
60.0000 [IU]/kg | Freq: Once | INTRAMUSCULAR | Status: AC
  Administered 2022-09-13: 3800 [IU] via INTRAVENOUS
  Filled 2022-09-13: qty 1

## 2022-09-13 MED ORDER — SODIUM CHLORIDE 0.9 % IV SOLN: INTRAVENOUS | Status: DC

## 2022-09-13 MED ORDER — SODIUM CHLORIDE 0.9 % IV SOLN
500.0000 mg | INTRAVENOUS | Status: AC
  Administered 2022-09-13 – 2022-09-15 (×3): 500 mg via INTRAVENOUS
  Filled 2022-09-13 (×3): qty 5

## 2022-09-13 MED ORDER — KETOROLAC TROMETHAMINE 60 MG/2ML IM SOLN: 30.0000 mg | Freq: Once | INTRAMUSCULAR | Status: DC

## 2022-09-13 MED ORDER — UMECLIDINIUM BROMIDE 62.5 MCG/ACT IN AEPB
1.0000 | INHALATION_SPRAY | Freq: Every day | RESPIRATORY_TRACT | Status: DC
  Administered 2022-09-14 – 2022-09-20 (×7): 1 via RESPIRATORY_TRACT
  Filled 2022-09-13 (×2): qty 7

## 2022-09-13 MED ORDER — ACETAMINOPHEN 650 MG RE SUPP: 650.0000 mg | Freq: Four times a day (QID) | RECTAL | Status: DC | PRN

## 2022-09-13 MED ORDER — CILOSTAZOL 100 MG PO TABS
100.0000 mg | ORAL_TABLET | Freq: Two times a day (BID) | ORAL | Status: DC
  Administered 2022-09-13 – 2022-09-20 (×14): 100 mg via ORAL
  Filled 2022-09-13 (×16): qty 1

## 2022-09-13 MED ORDER — PANTOPRAZOLE SODIUM 20 MG PO TBEC
20.0000 mg | DELAYED_RELEASE_TABLET | Freq: Every day | ORAL | Status: DC
  Administered 2022-09-14 – 2022-09-15 (×2): 20 mg via ORAL
  Filled 2022-09-13 (×3): qty 1

## 2022-09-13 MED ORDER — SODIUM CHLORIDE 0.9 % IV SOLN
1.0000 g | INTRAVENOUS | Status: AC
  Administered 2022-09-13 – 2022-09-17 (×5): 1 g via INTRAVENOUS
  Filled 2022-09-13 (×5): qty 10

## 2022-09-13 MED ORDER — DILTIAZEM HCL 25 MG/5ML IV SOLN
15.0000 mg | Freq: Once | INTRAVENOUS | Status: AC
  Administered 2022-09-13: 15 mg via INTRAVENOUS
  Filled 2022-09-13: qty 5

## 2022-09-13 MED ORDER — SODIUM CHLORIDE 0.9% FLUSH: 3.0000 mL | Freq: Two times a day (BID) | INTRAVENOUS | Status: DC

## 2022-09-13 MED ORDER — DIAZEPAM 5 MG/ML IJ SOLN
5.0000 mg | Freq: Once | INTRAMUSCULAR | Status: AC
  Administered 2022-09-13: 5 mg via INTRAVENOUS
  Filled 2022-09-13: qty 2

## 2022-09-13 MED ORDER — TIOTROPIUM BROMIDE MONOHYDRATE 2.5 MCG/ACT IN AERS: 2.0000 | INHALATION_SPRAY | Freq: Every day | RESPIRATORY_TRACT | Status: DC

## 2022-09-13 MED ORDER — INSULIN ASPART 100 UNIT/ML IJ SOLN
0.0000 [IU] | Freq: Three times a day (TID) | INTRAMUSCULAR | Status: DC
  Administered 2022-09-13 – 2022-09-14 (×2): 3 [IU] via SUBCUTANEOUS
  Administered 2022-09-14 – 2022-09-15 (×4): 2 [IU] via SUBCUTANEOUS
  Administered 2022-09-16: 3 [IU] via SUBCUTANEOUS
  Administered 2022-09-16 – 2022-09-20 (×7): 2 [IU] via SUBCUTANEOUS

## 2022-09-13 MED ORDER — INSULIN ASPART 100 UNIT/ML IJ SOLN: 0.0000 [IU] | Freq: Every day | INTRAMUSCULAR | Status: DC

## 2022-09-13 MED ORDER — IOHEXOL 350 MG/ML SOLN
100.0000 mL | Freq: Once | INTRAVENOUS | Status: AC | PRN
  Administered 2022-09-13: 100 mL via INTRAVENOUS

## 2022-09-13 MED ORDER — ASPIRIN 325 MG PO TABS
ORAL_TABLET | ORAL | Status: AC
  Filled 2022-09-13: qty 1

## 2022-09-13 MED ORDER — SODIUM CHLORIDE 0.9% FLUSH
3.0000 mL | Freq: Two times a day (BID) | INTRAVENOUS | Status: DC
  Administered 2022-09-13 – 2022-09-20 (×14): 3 mL via INTRAVENOUS

## 2022-09-13 MED ORDER — ASPIRIN 81 MG PO CHEW
324.0000 mg | CHEWABLE_TABLET | Freq: Once | ORAL | Status: AC
  Administered 2022-09-13: 324 mg via ORAL
  Filled 2022-09-13: qty 4

## 2022-09-13 MED ORDER — HEPARIN (PORCINE) 25000 UT/250ML-% IV SOLN
1100.0000 [IU]/h | INTRAVENOUS | Status: DC
  Administered 2022-09-13: 1000 [IU]/h via INTRAVENOUS
  Filled 2022-09-13: qty 250

## 2022-09-13 NOTE — ED Notes (Signed)
ED TO INPATIENT HANDOFF REPORT  ED Nurse Name and Phone #: Joan Mayans [phone]  S Name/Age/Gender Christine Hurley 72 y.o. female Room/Bed: 003C/003C  Code Status   Code Status: Full Code  Home/SNF/Other Home Patient oriented to: self, place, time, and situation Is this baseline? Yes   Triage Complete: Triage complete  Chief Complaint Myocarditis [facility]) [I51.4]  Triage Note Patient coming to ED for evaluation of SHOB.  Reports recently admitted for NSTEMI.  Has cardiac cath x 1 placed.  Discharged yesterday and began having increased SHOB.  States she is unable to lay flat.  Fells like something is sitting on her chest.  No current chest pain.  Increased RR and only able to speak a few words at a time  Patient came from home by POV with c/o SOB. She states she was recently seen for same complaint. She also voiced she used an old inhaler (Spiriva respimat) that made symptoms worse and uses Incruse ellipta.   Allergies Allergies  Allergen Reactions   Famotidine     "Made me tired and weak"    Level of Care/Admitting Diagnosis ED Disposition     ED Disposition  Admit   Condition  --   Comment  Hospital Area: [facility] [[id number]]  Level of Care: Telemetry Cardiac [[id number]]  May admit patient to [facility] or [name] if equivalent level of care is available:: No  Covid Evaluation: Asymptomatic - no recent exposure (last 10 days) testing not required  Diagnosis: Myocarditis [facility]) [[id number]]  Admitting Physician: Nolberto Hanlon [[id number]]  Attending Physician: Nolberto Hanlon [[id number]]  Certification:: I certify this patient will need inpatient services for at least 2 midnights  Estimated Length of Stay: 3          B Medical/Surgery History Past Medical History:  Diagnosis Date   Abnormal nuclear stress test February '13   Small, partially reversible inferolateral ischemia. Marked hypertensive response to exercise. Low risk.   Arthritis    Chest  pain with minimal risk for cardiac etiology February 2013   Evaluated with echocardiogram and Myoview, do   COPD (chronic obstructive pulmonary disease) (HCC)    Depression    Hepatitis C    Hypertension    Osteopenia of spine 05/22/2016   PAD (peripheral artery disease) (HCC) 10/01/2021   ABI: monophasic and decreased ABIs. Refer to vascular   Right tibial fracture     Past Surgical History:  Procedure Laterality Date   DOPPLER ECHOCARDIOGRAPHY  03/26/2011   LVEF>55% normal LV wall thickness, normal LA, mild aortic sclerosis with trace to mild AI, mild to moderate TR, RVSP of , RA pressure about 5 mmHg   LEFT HEART CATH AND CORONARY ANGIOGRAPHY N/A 09/10/2022   Procedure: LEFT HEART CATH AND CORONARY ANGIOGRAPHY;  Surgeon: Swazil[place], Peter M, MD;  Location: [facility] INVASIVE CV LAB;  Service: Cardiovascular;  Laterality: N/A;   NM MYOVIEW LTD  03/27/11   post stress ejection fraction is 72%, abnormal myocardial perfusion study, this is a low risk scan   TIBIA FRACTURE SURGERY Right 09/2008   fracture of right lower leg with open reduction and internal fixation     A IV Location/Drains/Wounds Patient Lines/Drains/Airways Status     Active Line/Drains/Airways     Name Placement date Placement time Site Days   Peripheral IV 09/13/22 20 G 1" Left Antecubital 09/13/22  0726  Antecubital  less than 1   Peripheral IV 09/13/22 20 G 1" Right Antecubital 09/13/22  0727  Antecubital  less than 1            Intake/Output Last 24 hours No intake or output data in the 24 hours ending 09/13/22 1507  Labs/Imaging Results for orders placed or performed during the hospital encounter of 09/13/22 (from the past 48 hour(s))  Hemoglobin A1c     Status: Abnormal   Collection Time: 09/13/22  7:27 AM  Result Value Ref Range   Hgb A1c MFr Bld 6.2 (H) 4.8 - 5.6 %    Comment: (NOTE) Pre diabetes:          5.7%-6.4%  Diabetes:              >6.4%  Glycemic control for   <7.0% adults with diabetes     Mean Plasma Glucose 131.24 mg/dL    Comment: Performed at [facility], [address] [place], [place] [address]  CBC with Differential/Platelet     Status: Abnormal   Collection Time: 09/13/22  7:27 AM  Result Value Ref Range   WBC 8.0 4.0 - 10.5 K/uL   RBC 4.17 3.87 - 5.11 MIL/uL   Hemoglobin 12.3 12.0 - 15.0 g/dL   HCT 13.2 44.0 - 10.2 %   MCV 89.2 80.0 - 100.0 fL   MCH 29.5 26.0 - 34.0 pg   MCHC 33.1 30.0 - 36.0 g/dL   RDW 72.5 (H) 36.6 - 44.0 %   Platelets 307 150 - 400 K/uL   nRBC 0.0 0.0 - 0.2 %   Neutrophils Relative % 84 %   Neutro Abs 6.8 1.7 - 7.7 K/uL   Lymphocytes Relative 8 %   Lymphs Abs 0.6 (L) 0.7 - 4.0 K/uL   Monocytes Relative 6 %   Monocytes Absolute 0.4 0.1 - 1.0 K/uL   Eosinophils Relative 1 %   Eosinophils Absolute 0.0 0.0 - 0.5 K/uL   Basophils Relative 0 %   Basophils Absolute 0.0 0.0 - 0.1 K/uL   Immature Granulocytes 1 %   Abs Immature Granulocytes 0.04 0.00 - 0.07 K/uL    Comment: Performed at [facility], [address] [place], [place] [address]  Protime-INR     Status: Abnormal   Collection Time: 09/13/22  7:27 AM  Result Value Ref Range   Prothrombin Time 17.5 (H) 11.4 - 15.2 seconds   INR 1.4 (H) 0.8 - 1.2    Comment: (NOTE) INR goal varies based on device and disease states. Performed at [facility], [address] [place], [place] [address]   APTT     Status: None   Collection Time: 09/13/22  7:27 AM  Result Value Ref Range   aPTT 31 24 - 36 seconds    Comment: Performed at [facility], [address] [place], [place] [address]  Comprehensive metabolic panel     Status: Abnormal   Collection Time: 09/13/22  7:27 AM  Result Value Ref Range   Sodium 132 (L) 135 - 145 mmol/L   Potassium 4.1 3.5 - 5.1 mmol/L   Chloride 101 98 - 111 mmol/L   CO2 22 22 - 32 mmol/L   Glucose, Bld 109 (H) 70 - 99 mg/dL    Comment: Glucose reference range applies only to samples taken  after fasting for at least 8 hours.   BUN 20 8 - 23 mg/dL   Creatinine, Ser 3.32 0.44 - 1.00 mg/dL   Calcium 8.9 8.9 - 95.1 mg/dL   Total Protein 7.5 6.5 - 8.1 g/dL  Albumin 3.3 (L) 3.5 - 5.0 g/dL   AST 841 (H) 15 - 41 U/L   ALT 263 (H) 0 - 44 U/L   Alkaline Phosphatase 198 (H) 38 - 126 U/L   Total Bilirubin 0.8 0.3 - 1.2 mg/dL   GFR, Estimated >32 >44 mL/min    Comment: (NOTE) Calculated using the CKD-EPI Creatinine Equation (2021)    Anion gap 9 5 - 15    Comment: Performed at [facility], [address] [place], [place] [address]  Troponin I (High Sensitivity)     Status: Abnormal   Collection Time: 09/13/22  7:27 AM  Result Value Ref Range   Troponin I (High Sensitivity) 1,521 (HH) <18 ng/L    Comment: CRITICAL RESULT CALLED TO, READ BACK BY AND VERIFIED WITH BRUNSON, T RN @ 0848 ON 09/13/2022 BY Deedra Ehrich, K (NOTE) Elevated high sensitivity troponin I (hsTnI) values and significant  changes across serial measurements may suggest ACS but many other  chronic and acute conditions are known to elevate hsTnI results.  Refer to the "Links" section for chest pain algorithms and additional  guidance. Performed at [facility], [address], [place], [place] [address]   Lipid panel     Status: None   Collection Time: 09/13/22  7:27 AM  Result Value Ref Range   Cholesterol 159 0 - 200 mg/dL   Triglycerides 95 <440 mg/dL   HDL 53 >34 mg/dL   Total CHOL/HDL Ratio 3.0 RATIO   VLDL 19 0 - 40 mg/dL   LDL Cholesterol 87 0 - 99 mg/dL    Comment:        Total Cholesterol/HDL:CHD Risk Coronary Heart Disease Risk Table                     Men   Women  1/2 Average Risk   3.4   3.3  Average Risk       5.0   4.4  2 X Average Risk   9.6   7.1  3 X Average Risk  23.4   11.0        Use the calculated Patient Ratio above and the CHD Risk Table to determine the patient's CHD Risk.        ATP III CLASSIFICATION (LDL):  <100     mg/dL   Optimal   742-595  mg/dL   Near or Above                    Optimal  130-159  mg/dL   Borderline  638-756  mg/dL   High  >433     mg/dL   Very High Performed at [facility], [address], [place], [place] [address]   I-Stat CG4 Lactic Acid, ED     Status: None   Collection Time: 09/13/22  7:43 AM  Result Value Ref Range   Lactic Acid, Venous 1.3 0.5 - 1.9 mmol/L  I-stat chem 8, ed     Status: Abnormal   Collection Time: 09/13/22  7:43 AM  Result Value Ref Range   Sodium 135 135 - 145 mmol/L   Potassium 4.1 3.5 - 5.1 mmol/L   Chloride 106 98 - 111 mmol/L   BUN 19 8 - 23 mg/dL   Creatinine, Ser 8.41 0.44 - 1.00 mg/dL   Glucose, Bld 93 70 - 99 mg/dL    Comment: Glucose reference range applies only to samples taken after fasting for at least  8 hours.   Calcium, Ion 0.92 (L) 1.15 - 1.40 mmol/L   TCO2 18 (L) 22 - 32 mmol/L   Hemoglobin 10.9 (L) 12.0 - 15.0 g/dL   HCT 45.4 (L) 09.8 - 11.9 %  Brain natriuretic peptide     Status: Abnormal   Collection Time: 09/13/22  8:41 AM  Result Value Ref Range   B Natriuretic Peptide 278.5 (H) 0.0 - 100.0 pg/mL    Comment: Performed at [facility], [address] [place], [place] [address]  Troponin I (High Sensitivity)     Status: Abnormal   Collection Time: 09/13/22  9:27 AM  Result Value Ref Range   Troponin I (High Sensitivity) 994 (HH) <18 ng/L    Comment: CRITICAL RESULT CALLED TO, READ BACK BY AND VERIFIED WITH S.THORNTON RN @1054  09/13/22 E,BENTON (NOTE) Elevated high sensitivity troponin I (hsTnI) values and significant  changes across serial measurements may suggest ACS but many other  chronic and acute conditions are known to elevate hsTnI results.  Refer to the "Links" section for chest pain algorithms and additional  guidance. Performed at [facility], [address] [place], [place] [address]   C-reactive protein     Status: None   Collection Time: 09/13/22 11:13 AM  Result Value Ref  Range   CRP 0.7 <1.0 mg/dL    Comment: Performed at [facility], [address], [place], [place] [address]   ECHOCARDIOGRAM LIMITED  Result Date: 09/13/2022    ECHOCARDIOGRAM LIMITED REPORT   [name] Date of Exam: 09/13/2022 Medical Rec #:  [id number]       Height:       66.0 in Accession #:    [id number]      Weight:       140.0 lb Date of Birth:  Jan 20, 1951       BSA:          1.719 m Patient Age:    72 years        BP:           136/84 mmHg Patient Gender: F               HR:           98 bpm. Exam Location:  Inpatient Procedure: Limited Echo and Limited Color Doppler Indications:     Chest Pain R07.9  History:         Patient has prior history of Echocardiogram examinations, most                  recent 09/09/2022. CAD and Previous Myocardial Infarction, PAD,                  Arrythmias:Tachycardia; Risk Factors:Hypertension, Current                  Smoker and Dyslipidemia.  Sonographer:     Lucendia Herrlich Referring Phys:  [id number] Perlie Gold Diagnosing Phys: Weston Brass MD IMPRESSIONS  1. Left ventricular ejection fraction, by estimation, is 50 to 55%. The left ventricle has low normal function. The left ventricle demonstrates regional wall motion abnormalities (see scoring diagram/findings for description).  2. Right ventricular systolic function is mildly reduced. The right ventricular size is normal. The estimated right ventricular systolic pressure is 28.4 mmHg.  3. The mitral valve is grossly normal. Trivial mitral valve regurgitation.  4. The aortic valve is grossly normal. Aortic valve regurgitation is trivial.  5.  The inferior vena cava is dilated in size with <50% respiratory variability, suggesting right atrial pressure of 15 mmHg. FINDINGS  Left Ventricle: Left ventricular ejection fraction, by estimation, is 50 to 55%. The left ventricle has low normal function. The left ventricle demonstrates regional wall motion abnormalities. There is borderline left  ventricular hypertrophy.  LV Wall Scoring: The anterior septum, mid inferoseptal segment, and basal inferoseptal segment are hypokinetic. Right Ventricle: The right ventricular size is normal. Right ventricular systolic function is mildly reduced. The tricuspid regurgitant velocity is 1.83 m/s, and with an assumed right atrial pressure of 15 mmHg, the estimated right ventricular systolic pressure is 28.4 mmHg. Pericardium: Trivial pericardial effusion is present. Mitral Valve: The mitral valve is grossly normal. Trivial mitral valve regurgitation. Tricuspid Valve: The tricuspid valve is grossly normal. Tricuspid valve regurgitation is trivial. Aortic Valve: The aortic valve is grossly normal. Aortic valve regurgitation is trivial. Venous: The inferior vena cava is dilated in size with less than 50% respiratory variability, suggesting right atrial pressure of 15 mmHg. LEFT VENTRICLE PLAX 2D LVIDd:         4.00 cm LVIDs:         2.60 cm LV PW:         0.90 cm LV IVS:        1.00 cm  IVC IVC diam: 2.30 cm LEFT ATRIUM         Index LA diam:    3.40 cm 1.98 cm/m  TRICUSPID VALVE TR Peak grad:   13.4 mmHg TR Vmax:        183.00 cm/s Weston Brass MD Electronically signed by Weston Brass MD Signature Date/Time: 09/13/2022/11:20:31 AM    Final (Updated)    CT Angio Chest/Abd/Pel for Dissection W and/or Wo Contrast  Result Date: 09/13/2022 CLINICAL DATA:  Shortness of breath, recent NSTEMI and cardiac catheterization, suspected multifocal hepatocellular carcinoma * Tracking Code: BO * EXAM: CT ANGIOGRAPHY CHEST, ABDOMEN AND PELVIS TECHNIQUE: Non-contrast CT of the chest was initially obtained. Multidetector CT imaging through the chest, abdomen and pelvis was performed using the standard protocol during bolus administration of intravenous contrast. Multiplanar reconstructed images and MIPs were obtained and reviewed to evaluate the vascular anatomy. RADIATION DOSE REDUCTION: This exam was performed according to the  departmental dose-optimization program which includes automated exposure control, adjustment of the mA and/or kV according to patient size and/or use of iterative reconstruction technique. CONTRAST:  OMNIPAQUE IOHEXOL 350 MG/ML SOLN COMPARISON:  CT chest angiogram, 09/08/2022, CT chest abdomen pelvis angiogram, 07/18/2022 MR abdomen, 07/19/2018 FINDINGS: CTA CHEST FINDINGS VASCULAR Aorta: Satisfactory opacification of the aorta. Normal contour and caliber of the thoracic aorta. No evidence of aneurysm, dissection, or other acute aortic pathology. Moderate mixed calcific atherosclerosis. Cardiovascular: No evidence of pulmonary embolism on limited non-tailored examination. Mild cardiomegaly. Left and right coronary artery calcifications. No pericardial effusion. Review of the MIP images confirms the above findings. NON VASCULAR Mediastinum/Nodes: No enlarged mediastinal, hilar, or axillary lymph nodes. Thyroid gland, trachea, and esophagus demonstrate no significant findings. Lungs/Pleura: New small right pleural effusion. Similar dependent bibasilar atelectasis or consolidation. Musculoskeletal: No chest wall abnormality. No acute osseous findings. Review of the MIP images confirms the above findings. CTA ABDOMEN AND PELVIS FINDINGS VASCULAR Normal contour and caliber of the abdominal aorta. No evidence of aneurysm, dissection, or other acute aortic pathology. Standard branching pattern of the abdominal aorta with solitary bilateral renal arteries. Moderate mixed calcific atherosclerosis. Review of the MIP images confirms the above findings. NON-VASCULAR  Hepatobiliary: Coarse, nodular cirrhotic morphology of the liver, again with multiple hyperenhancing lesions throughout the liver and extensive portal venous thrombus, better assessed by prior MR (series 11, image 124). No gallstones, gallbladder wall thickening, or biliary dilatation. Pancreas: Unremarkable. No pancreatic ductal dilatation or surrounding  inflammatory changes. Spleen: Normal in size without significant abnormality. Adrenals/Urinary Tract: Adrenal glands are unremarkable. Kidneys are normal, without renal calculi, solid lesion, or hydronephrosis. Bladder is unremarkable. Stomach/Bowel: Stomach is within normal limits. Appendix appears normal. No evidence of bowel wall thickening, distention, or inflammatory changes. Lymphatic: No enlarged abdominal or pelvic lymph nodes. Reproductive: No mass or other significant abnormality. Other: No abdominal wall hernia or abnormality. Moderate volume simple fluid attenuation ascites throughout the abdomen and pelvis. Musculoskeletal: No acute osseous findings. IMPRESSION: 1. No evidence of aortic aneurysm, dissection, or other acute aortic pathology. 2. No evidence of pulmonary embolism on limited non-tailored examination. 3. New small right pleural effusion. Similar dependent bibasilar atelectasis or consolidation. 4. Coarse, nodular cirrhotic morphology of the liver, again with multiple hyperenhancing lesions throughout the liver and extensive portal venous thrombus, better assessed by prior MR and presumably reflecting multifocal hepatocellular carcinoma. 5. Moderate volume simple fluid attenuation ascites throughout the abdomen and pelvis. 6. Coronary artery disease. Aortic Atherosclerosis (ICD10-I70.0). Electronically Signed   By: Jearld Lesch M.D.   On: 09/13/2022 08:39   DG Chest Port 1 View  Result Date: 09/13/2022 CLINICAL DATA:  Code STEMI EXAM: PORTABLE CHEST 1 VIEW COMPARISON:  09/08/2022 radiograph and chest CT FINDINGS: Low volume chest with hazy density at the bases, greater on the right. Aortic tortuosity accentuated by rotation, unchanged. No edema, effusion, or pneumothorax. IMPRESSION: Low volume chest with atelectasis at the bases, stable from recent chest CT. Electronically Signed   By: Tiburcio Pea M.D.   On: 09/13/2022 08:12    Pending Labs Unresulted Labs (From admission, onward)      Start     Ordered   09/14/22 0500  Heparin level (unfractionated)  Daily,   R      09/13/22 1142   09/14/22 0500  APTT  Daily,   R      09/13/22 1142   09/14/22 0500  APTT  Tomorrow morning,   R        09/13/22 1143   09/14/22 0500  Protime-INR  Tomorrow morning,   R        09/13/22 1143   09/14/22 0500  Basic metabolic panel  Tomorrow morning,   R        09/13/22 1143   09/14/22 0500  CBC  Tomorrow morning,   R        09/13/22 1143   09/14/22 0500  Hepatic function panel  Tomorrow morning,   R        09/13/22 1450   09/13/22 2000  Heparin level (unfractionated)  Once-Timed,   URGENT        09/13/22 1142   09/13/22 2000  APTT  Once-Timed,   STAT        09/13/22 1142   09/13/22 1506  HCV RNA quant  Once,   R        09/13/22 1506   09/13/22 1502  Respiratory (~20 pathogens) panel by PCR  (Respiratory panel by PCR (~20 pathogens, ~24 hr TAT)  w precautions)  Once,   R        09/13/22 1502   09/13/22 1502  SARS Coronavirus 2 by RT PCR (hospital order, performed in [facility]  hospital lab) *cepheid single result test* Anterior Nasal Swab  (Tier 2 - SARS Coronavirus 2 by RT PCR (hospital order, performed in [facility] hospital lab) *cepheid single result test*)  Once,   R        09/13/22 1502   09/13/22 1502  Culture, blood (Routine X 2) w Reflex to ID Panel  BLOOD CULTURE X 2,   R (with TIMED occurrences)      09/13/22 1502   09/13/22 1200  Sedimentation rate  Once,   STAT        09/13/22 1200            Vitals/Pain Today's Vitals   09/13/22 0932 09/13/22 1004 09/13/22 1100 09/13/22 1223  BP:   119/84 113/69  Pulse:   94 93  Resp:   (!) 21 17  Temp:    97.9 F (36.6 C)  TempSrc:    Oral  SpO2:   100% 100%  Weight:      Height:      PainSc: 2  0-No pain      Isolation Precautions Airborne and Contact precautions  Medications Medications  0.9 %  sodium chloride infusion ( Intravenous New Bag/Given 09/13/22 0732)  heparin ADULT infusion 100 units/mL (25000  units/21mL) (1,000 Units/hr Intravenous New Bag/Given 09/13/22 1245)  0.9 %  sodium chloride infusion ( Intravenous New Bag/Given 09/13/22 1246)  polyethylene glycol (MIRALAX / GLYCOLAX) packet 17 g (has no administration in time range)  sodium chloride flush (NS) 0.9 % injection 3 mL (3 mLs Intravenous Given 09/13/22 1247)  acetaminophen (TYLENOL) tablet 650 mg (has no administration in time range)    Or  acetaminophen (TYLENOL) suppository 650 mg (has no administration in time range)  nicotine polacrilex (NICORETTE) gum 2 mg (has no administration in time range)  furosemide (LASIX) injection 40 mg (has no administration in time range)  aspirin chewable tablet 324 mg (324 mg Oral Given 09/13/22 0734)  heparin injection 3,800 Units (3,800 Units Intravenous Given 09/13/22 0735)  iohexol (OMNIPAQUE) 350 MG/ML injection 100 mL (100 mLs Intravenous Contrast Given 09/13/22 0806)    Mobility walks     Focused Assessments Pulmonary Assessment Handoff:  Lung sounds:   O2 Device: Room Air O2 Flow Rate (L/min): 2 L/min    R Recommendations: See Admitting Provider Note  Report given to:   Additional Notes:

## 2022-09-13 NOTE — ED Triage Notes (Signed)
Patient coming to ED for evaluation of SHOB.  Reports recently admitted for NSTEMI.  Has cardiac cath x 1 placed.  Discharged yesterday and began having increased SHOB.  States she is unable to lay flat.  Fells like something is sitting on her chest.  No current chest pain.  Increased RR and only able to speak a few words at a time

## 2022-09-13 NOTE — Progress Notes (Signed)
5 mg IV Valium given per order. Remaining medication wasted in [employer] with Valda Favia., RN as witness.

## 2022-09-13 NOTE — Assessment & Plan Note (Addendum)
Durvalumab and tremelimumab have been reported to cause myocarditis and pericarditis . I spoke with dr. Truett Perna of oncology support to manage this. Case reports of support steroid use for  managing this situation, however, concensus guidelines are not available due to the rarity of this complication. Will give 1 gm methylprednisolone tonight. Patient seems to have HCV mentioned in the chart which will need monitoring. I will check HCV levels as well.

## 2022-09-13 NOTE — Progress Notes (Addendum)
ANTICOAGULATION CONSULT NOTE - Initial Consult  Pharmacy Consult for Heparin Indication: chest pain/ACS  Allergies  Allergen Reactions   Famotidine     "Made me tired and weak"    Patient Measurements: Height: 5\' 6"  (167.6 cm) Weight: 63.5 kg (140 lb) IBW/kg (Calculated) : 59.3 Heparin Dosing Weight: 63.5 kg  Vital Signs: Temp: 97.8 F (36.6 C) (08/03 0722) Temp Source: Oral (08/03 0722) BP: 136/84 (08/03 0900) Pulse Rate: 101 (08/03 0900)  Labs: Recent Labs    09/11/22 0500 09/12/22 0446 09/13/22 0727 09/13/22 0743  HGB 12.5 11.5* 12.3 10.9*  HCT 38.5 35.1* 37.2 32.0*  PLT 269 235 307  --   APTT  --   --  31  --   LABPROT  --   --  17.5*  --   INR  --   --  1.4*  --   CREATININE 0.62 0.64 0.69 0.50  TROPONINIHS  --   --  1,521*  --     Estimated Creatinine Clearance: 59.5 mL/min (by C-G formula based on SCr of 0.5 mg/dL).   Medical History: Past Medical History:  Diagnosis Date   Abnormal nuclear stress test February '13   Small, partially reversible inferolateral ischemia. Marked hypertensive response to exercise. Low risk.   Arthritis    Chest pain with minimal risk for cardiac etiology February 2013   Evaluated with echocardiogram and Myoview, do   COPD (chronic obstructive pulmonary disease) (HCC)    Depression    Hepatitis C    Hypertension    Osteopenia of spine 05/22/2016   PAD (peripheral artery disease) (HCC) 10/01/2021   ABI: monophasic and decreased ABIs. Refer to vascular   Right tibial fracture      Medications:  (Not in a hospital admission)  Scheduled:  Infusions:   sodium chloride 20 mL/hr at 09/13/22 0732   PRN:   Assessment: 13 yof with a history of hepatocellular carcinoma currently on chemotherapy, hypertension, PAD, COPD, hep C, depression, received last chemotherapy on 08/15/2022. Patient presented to [facility] ED with SOB since last night. Patient admitted 7/29-8/2 at [facility] for NSTEMI.  Heparin per pharmacy consult placed for chest  pain/ACS. Notably, heparin bolus given in [facility] ED prior to pharmacy consult review on transfer to [facility] ED.  Patient is on apixaban prior to arrival. Given discharge yesterday and return to ED, unsure of last apixaban dose at home. However, it was given 8/2 AM while at [facility]. Will require aPTT monitoring due to likely falsely high anti-Xa level secondary to DOAC use.  Per chart review, patient required infusion rate increase to 1150 units/hr before placed back on eliquis (7/31).  Hgb 10.9; plt 307  Goal of Therapy:  Heparin level 0.3-0.7 units/ml aPTT 66-102 seconds Monitor platelets by anticoagulation protocol: Yes   Plan:  3800 unit IV heparin bolus given at [facility] ED Start heparin infusion at 1000 units/hr given (previously on 1150 units/hr) Check aPTT & anti-Xa level in 8 hours and daily while on heparin Continue to monitor via aPTT until levels are correlated Continue to monitor H&H and platelets  Delmar Landau, PharmD, BCPS 09/13/2022 10:07 AM ED Clinical Pharmacist -  [phone]  ADDENDUM: Cardiology now recommending no need for heparin from their standpoint given recent work-up. D/c heparin per pharmacy.  Delmar Landau, PharmD, BCPS 09/13/2022 10:59 AM ED Clinical Pharmacist -  [phone]  ADDENDUM #2: Following results of ECHO, cardiology now deciding that heparin gtt is more appropriate than DOAC at this point in work up Heparin  infusion to be started per original plan above.  Thanks, Delmar Landau, PharmD, BCPS 09/13/2022 11:43 AM ED Clinical Pharmacist -  [phone]

## 2022-09-13 NOTE — Assessment & Plan Note (Signed)
Mild pleural effusion on CAT scan as well as a small amount of simple fluid attenuation ascites throughout abdomen and pelvis.  Patient is symptomatic in terms of sensation of shortness of breath.  I will give a dose of Lasix and monitor clinically

## 2022-09-13 NOTE — ED Notes (Signed)
[facility] called. 

## 2022-09-13 NOTE — Consult Note (Signed)
Cardiology Consultation   Patient ID: Christine Hurley MRN: [id number]; DOB: 1950/05/22  Admit date: 09/13/2022 Date of Consult: 09/13/2022  PCP:  Willow Ora, MD   [facility] Providers Cardiologist:  Chilton Si, MD        Patient Profile:   [name] is a 72 y.o. female with a hx of Christine Hurley is a 72 y.o. female with a hx of aortic valve sclerosis, hypertension, smoking history, COPD, PAD, hepatitis C, and hepatocellular carcinoma  who is being seen 09/13/2022 for the evaluation of chest pain and abnormal ECG at the request of [name].  History of Present Illness:   [name] underwent nuclear stress test in 2015 that was nonischemic.  Echocardiogram at that time with a preserved EF mild aortic sclerosis mild to moderate TR.  She was referred back to cardiology in 2015 for dyspnea on exertion by pulmonology.  Repeat echocardiogram 2017 continue to show preserved EF and grade 1 DD.   Recently lower extremity duplex 09/23/2021 showed moderate bilateral lower extremity arterial disease, she was referred to [facility].  I do not see that that happened.   She was hospitalized 07/2022 for colitis and CT imaging showed multiple liver masses, portal venous thrombosis, colitis, high-grade stenosis/occlusion of the proximal superficial femoral arteries bilaterally, and left adrenal nodule.  Liver MRI showed extensive tumor identified throughout both lobes of the liver.  Further workup was diagnostic for multifocal hepatocellular carcinoma, she is following with Dr. [name].  She has known transaminitis.  She is currently receiving chemotherapy treatment: remelimumab-actl C1 D1 + Durvalumab q28d.  She has been on Eliquis for approximately 1 month for portal vein thrombosis.  She was seen by oncology 09/04/2022 with pruritus felt secondary to her infusion treatment and was given Pepcid, Benadryl, and steroids.   She presented to the infusion center for shortness of breath  and DOE earlier this week and was treated with steroids but continued to have trouble breathing and presented to the ER.  She was hypoxic with O2 80% on RA requiring O2. She was tachycardic at 120 bpm with BP  140/100. She subsequently developed chest pressure relieved with IV morphine. EKG showed ST elevation in V1/V2 and a septal infarct pattern.  Case was discussed with on-call STEMI MD Dr. [name] who did not feel acute lab activation was necessary given that the patient was now chest pain-free. She was admitted with urgent cardiology consult. On cath she was found to have mild non-obstructive disease but does have extensive coronary calcification on CT images.   Following discharge on 8/1, patient returned to the ED today with recurrent chest pressure and shortness of breath. Patient says that both worsen significantly when laying flat and improve with sitting up/leaning forward. ECG with concerning septal and anterior lead ST segment elevation but per interventional provider review, no cath lab activation given non-obstructive disease on 09/10/22 LHC. Patient transferred to [facility] ED from [facility] for further evaluation and management.   On exam in the ED today, patient uncomfortable appearing. She confirms chest tightness and shortness of breath that worsen when supine and improve when sitting up. Says that pain/shortness of breath returned last night after she had a spell of coughing.    Past Medical History:  Diagnosis Date   Abnormal nuclear stress test February '13   Small, partially reversible inferolateral ischemia. Marked hypertensive response to exercise. Low risk.   Arthritis    Chest pain with minimal risk for cardiac etiology February 2013  Evaluated with echocardiogram and Myoview, do   COPD (chronic obstructive pulmonary disease) (HCC)    Depression    Hepatitis C    Hypertension    Osteopenia of spine 05/22/2016   PAD (peripheral artery disease) (HCC) 10/01/2021   ABI: monophasic and  decreased ABIs. Refer to vascular   Right tibial fracture      Past Surgical History:  Procedure Laterality Date   DOPPLER ECHOCARDIOGRAPHY  03/26/2011   LVEF>55% normal LV wall thickness, normal LA, mild aortic sclerosis with trace to mild AI, mild to moderate TR, RVSP of , RA pressure about 5 mmHg   LEFT HEART CATH AND CORONARY ANGIOGRAPHY N/A 09/10/2022   Procedure: LEFT HEART CATH AND CORONARY ANGIOGRAPHY;  Surgeon: [place], Peter M, MD;  Location: [facility] INVASIVE CV LAB;  Service: Cardiovascular;  Laterality: N/A;   NM MYOVIEW LTD  03/27/11   post stress ejection fraction is 72%, abnormal myocardial perfusion study, this is a low risk scan   TIBIA FRACTURE SURGERY Right 09/2008   fracture of right lower leg with open reduction and internal fixation       Inpatient Medications: Scheduled Meds:  Continuous Infusions:  sodium chloride 20 mL/hr at 09/13/22 0732   heparin     PRN Meds:   Allergies:    Allergies  Allergen Reactions   Famotidine     "Made me tired and weak"    Social History:   Social History   Socioeconomic History   Marital status: Divorced    Spouse name: Not on file   Number of children: 1   Years of education: Not on file   Highest education level: Not on file  Occupational History   Not on file  Tobacco Use   Smoking status: Every Day    Current packs/day: 0.25    Average packs/day: 0.3 packs/day for 45.0 years (11.3 ttl pk-yrs)    Types: Cigarettes   Smokeless tobacco: Never   Tobacco comments:    2 cigs per day   Vaping Use   Vaping status: Never Used  Substance and Sexual Activity   Alcohol use: Yes    Alcohol/week: 2.0 standard drinks of alcohol    Types: 2 Cans of beer per week    Comment: socially    Drug use: Not Currently    Types: Marijuana    Comment: 2 days ago    Sexual activity: Yes    Birth control/protection: Post-menopausal  Other Topics Concern   Not on file  Social History Narrative   Not on file   Social  Determinants of Health   Financial Resource Strain: Low Risk  (03/13/2022)   Overall Financial Resource Strain (CARDIA)    Difficulty of Paying Living Expenses: Not hard at all  Food Insecurity: No Food Insecurity (09/08/2022)   Hunger Vital Sign    Worried About Running Out of Food in the Last Year: Never true    Ran Out of Food in the Last Year: Never true  Transportation Needs: No Transportation Needs (09/08/2022)   PRAPARE - Administrator, Civil Service (Medical): No    Lack of Transportation (Non-Medical): No  Physical Activity: Inactive (03/13/2022)   Exercise Vital Sign    Days of Exercise per Week: 0 days    Minutes of Exercise per Session: 0 min  Stress: No Stress Concern Present (03/13/2022)   [employer] of Occupational Health - Occupational Stress Questionnaire    Feeling of Stress : Not at all  Social  Connections: Moderately Isolated (03/13/2022)   Social Connection and Isolation Panel [NHANES]    Frequency of Communication with Friends and Family: More than three times a week    Frequency of Social Gatherings with Friends and Family: More than three times a week    Attends Religious Services: More than 4 times per year    Active Member of [employer] or Organizations: No    Attends Banker Meetings: Never    Marital Status: Divorced  Catering manager Violence: Not At Risk (09/08/2022)   Humiliation, Afraid, Rape, and Kick questionnaire    Fear of Current or Ex-Partner: No    Emotionally Abused: No    Physically Abused: No    Sexually Abused: No    Family History:    Family History  Problem Relation Age of Onset   Asthma Mother    Cancer Mother        Brain   Cancer - Prostate Father    Cancer Maternal Grandmother        GYN cancer     ROS:  Please see the history of present illness.   All other ROS reviewed and negative.     Physical Exam/Data:   Vitals:   09/13/22 0630 09/13/22 0719 09/13/22 0722 09/13/22 0900  BP: (!) 136/92  (!)  154/107 136/84  Pulse: 96  93 (!) 101  Resp: 17  (!) 29 (!) 23  Temp: 97.8 F (36.6 C)  97.8 F (36.6 C)   TempSrc: Oral  Oral   SpO2: 98%  99% 98%  Weight:  63.5 kg    Height:  5\' 6"  (1.676 m)     No intake or output data in the 24 hours ending 09/13/22 1058    09/13/2022    7:19 AM 09/09/2022    1:17 PM 09/09/2022    5:40 AM  Last 3 Weights  Weight (lbs) 140 lb 141 lb 8.6 oz 142 lb 3.2 oz  Weight (kg) 63.504 kg 64.2 kg 64.5 kg     Body mass index is 22.6 kg/m.  General:  Well nourished, well developed. Uncomfortable appearing. HEENT: normal Neck: no JVD Vascular: No carotid bruits; Distal pulses 2+ bilaterally Cardiac:  normal S1, S2; RRR; no murmur  Lungs:  clear to auscultation bilaterally, no wheezing, rhonchi or rales  Abd: soft, nontender, no hepatomegaly  Ext: no edema Musculoskeletal:  No deformities, BUE and BLE strength normal and equal Skin: warm and dry  Neuro:  CNs 2-12 intact, no focal abnormalities noted Psych:  Normal affect   EKG:  The EKG was personally reviewed and demonstrates:  sinus rhythm with acute ST segment elevation in V1,V3, V3. Also with PQ elevation in AVR. Telemetry:  Telemetry was personally reviewed and demonstrates:  sinus rhythm/sinus tachycardia  Relevant CV Studies:  09/10/22 LHC  Mild nonobstructive CAD Normal LVEDP 7 mm Hg   Diagnostic Dominance: Right     09/09/22 TTE  IMPRESSIONS     1. Technically difficult study with poor visualization of cardiac  structures.   2. Left ventricular ejection fraction, by estimation, is 60 to 65%. The  left ventricle has normal function. The left ventricle has no regional  wall motion abnormalities.   3. Right ventricular systolic function is normal. The right ventricular  size is normal. There is normal pulmonary artery systolic pressure.   4. The mitral valve is normal in structure. No evidence of mitral valve  regurgitation. No evidence of mitral stenosis.   5. The aortic valve  is normal in structure. There is mild calcification  of the aortic valve. Aortic valve regurgitation is trivial. No aortic  stenosis is present.   6. The inferior vena cava is normal in size with greater than 50%  respiratory variability, suggesting right atrial pressure of 3 mmHg.   FINDINGS   Left Ventricle: Left ventricular ejection fraction, by estimation, is 60  to 65%. The left ventricle has normal function. The left ventricle has no  regional wall motion abnormalities. The left ventricular internal cavity  size was normal in size. There is   no left ventricular hypertrophy. Left ventricular diastolic function  could not be evaluated due to nondiagnostic images.   Right Ventricle: The right ventricular size is normal. No increase in  right ventricular wall thickness. Right ventricular systolic function is  normal. There is normal pulmonary artery systolic pressure. The tricuspid  regurgitant velocity is 2.08 m/s, and   with an assumed right atrial pressure of 3 mmHg, the estimated right  ventricular systolic pressure is 20.3 mmHg.   Left Atrium: Left atrial size was normal in size.   Right Atrium: Right atrial size was normal in size.   Pericardium: There is no evidence of pericardial effusion.   Mitral Valve: The mitral valve is normal in structure. No evidence of  mitral valve regurgitation. No evidence of mitral valve stenosis.   Tricuspid Valve: The tricuspid valve is normal in structure. Tricuspid  valve regurgitation is trivial. No evidence of tricuspid stenosis.   Aortic Valve: The aortic valve is normal in structure. There is mild  calcification of the aortic valve. Aortic valve regurgitation is trivial.  No aortic stenosis is present.   Pulmonic Valve: The pulmonic valve was normal in structure. Pulmonic valve  regurgitation is not visualized. No evidence of pulmonic stenosis.   Aorta: The aortic root is normal in size and structure.   Venous: The inferior  vena cava is normal in size with greater than 50%  respiratory variability, suggesting right atrial pressure of 3 mmHg.   IAS/Shunts: No atrial level shunt detected by color flow Doppler.    09/13/22 Limited TTE  IMPRESSIONS     1. Left ventricular ejection fraction, by estimation, is 50 to 55%. The  left ventricle has low normal function. The left ventricle demonstrates  regional wall motion abnormalities (see scoring diagram/findings for  description).   2. Right ventricular systolic function is mildly reduced. The right  ventricular size is normal. The estimated right ventricular systolic  pressure is 28.4 mmHg.   3. The mitral valve is grossly normal. Trivial mitral valve  regurgitation.   4. The aortic valve is grossly normal. Aortic valve regurgitation is  trivial.   5. The inferior vena cava is dilated in size with <50% respiratory  variability, suggesting right atrial pressure of 15 mmHg.   FINDINGS   Left Ventricle: Left ventricular ejection fraction, by estimation, is 50  to 55%. The left ventricle has low normal function. The left ventricle  demonstrates regional wall motion abnormalities. There is borderline left  ventricular hypertrophy.     LV Wall Scoring:  The anterior septum, mid inferoseptal segment, and basal inferoseptal  segment  are hypokinetic.   Right Ventricle: The right ventricular size is normal. Right ventricular  systolic function is mildly reduced. The tricuspid regurgitant velocity is  1.83 m/s, and with an assumed right atrial pressure of 15 mmHg, the  estimated right ventricular systolic  pressure is 28.4 mmHg.   Pericardium: Trivial pericardial effusion  is present.   Mitral Valve: The mitral valve is grossly normal. Trivial mitral valve  regurgitation.   Tricuspid Valve: The tricuspid valve is grossly normal. Tricuspid valve  regurgitation is trivial.   Aortic Valve: The aortic valve is grossly normal. Aortic valve  regurgitation  is trivial.   Venous: The inferior vena cava is dilated in size with less than 50%  respiratory variability, suggesting right atrial pressure of 15 mmHg.   Laboratory Data:  High Sensitivity Troponin:   Recent Labs  Lab 09/08/22 2057 09/08/22 2140 09/09/22 0215 09/13/22 0727 09/13/22 0927  TROPONINIHS 467* 472* 483* 1,521* 994*     Chemistry Recent Labs  Lab 09/09/22 0215 09/10/22 0709 09/11/22 0500 09/12/22 0446 09/13/22 0727 09/13/22 0743  NA 135   < > 136 133* 132* 135  K 3.3*   < > 3.6 3.7 4.1 4.1  CL 108   < > 105 104 101 106  CO2 18*   < > 20* 20* 22  --   GLUCOSE 109*   < > 96 104* 109* 93  BUN 24*   < > 19 19 20 19   CREATININE 0.86   < > 0.62 0.64 0.69 0.50  CALCIUM 8.4*   < > 8.9 8.4* 8.9  --   MG 1.9  --   --  2.0  --   --   GFRNONAA >60   < > >60 >60 >60  --   ANIONGAP 9   < > 11 9 9   --    < > = values in this interval not displayed.    Recent Labs  Lab 09/11/22 0500 09/12/22 0446 09/13/22 0727  PROT 7.5 6.5 7.5  ALBUMIN 3.4* 3.0* 3.3*  AST 319* 252* 288*  ALT 332* 245* 263*  ALKPHOS 236* 185* 198*  BILITOT 0.9 1.0 0.8   Lipids  Recent Labs  Lab 09/13/22 0727  CHOL 159  TRIG 95  HDL 53  LDLCALC 87  CHOLHDL 3.0    Hematology Recent Labs  Lab 09/11/22 0500 09/12/22 0446 09/13/22 0727 09/13/22 0743  WBC 7.3 6.7 8.0  --   RBC 4.22 3.87 4.17  --   HGB 12.5 11.5* 12.3 10.9*  HCT 38.5 35.1* 37.2 32.0*  MCV 91.2 90.7 89.2  --   MCH 29.6 29.7 29.5  --   MCHC 32.5 32.8 33.1  --   RDW 15.4 15.3 15.7*  --   PLT 269 235 307  --    Thyroid No results for input(s): "TSH", "FREET4" in the last 168 hours.  BNP Recent Labs  Lab 09/08/22 1353 09/13/22 0841  BNP 91.1 278.5*    DDimer No results for input(s): "DDIMER" in the last 168 hours.   Radiology/Studies:  CT Angio Chest/Abd/Pel for Dissection W and/or Wo Contrast  Result Date: 09/13/2022 CLINICAL DATA:  Shortness of breath, recent NSTEMI and cardiac catheterization, suspected  multifocal hepatocellular carcinoma * Tracking Code: BO * EXAM: CT ANGIOGRAPHY CHEST, ABDOMEN AND PELVIS TECHNIQUE: Non-contrast CT of the chest was initially obtained. Multidetector CT imaging through the chest, abdomen and pelvis was performed using the standard protocol during bolus administration of intravenous contrast. Multiplanar reconstructed images and MIPs were obtained and reviewed to evaluate the vascular anatomy. RADIATION DOSE REDUCTION: This exam was performed according to the departmental dose-optimization program which includes automated exposure control, adjustment of the mA and/or kV according to patient size and/or use of iterative reconstruction technique. CONTRAST:  OMNIPAQUE IOHEXOL 350 MG/ML SOLN COMPARISON:  CT  chest angiogram, 09/08/2022, CT chest abdomen pelvis angiogram, 07/18/2022 MR abdomen, 07/19/2018 FINDINGS: CTA CHEST FINDINGS VASCULAR Aorta: Satisfactory opacification of the aorta. Normal contour and caliber of the thoracic aorta. No evidence of aneurysm, dissection, or other acute aortic pathology. Moderate mixed calcific atherosclerosis. Cardiovascular: No evidence of pulmonary embolism on limited non-tailored examination. Mild cardiomegaly. Left and right coronary artery calcifications. No pericardial effusion. Review of the MIP images confirms the above findings. NON VASCULAR Mediastinum/Nodes: No enlarged mediastinal, hilar, or axillary lymph nodes. Thyroid gland, trachea, and esophagus demonstrate no significant findings. Lungs/Pleura: New small right pleural effusion. Similar dependent bibasilar atelectasis or consolidation. Musculoskeletal: No chest wall abnormality. No acute osseous findings. Review of the MIP images confirms the above findings. CTA ABDOMEN AND PELVIS FINDINGS VASCULAR Normal contour and caliber of the abdominal aorta. No evidence of aneurysm, dissection, or other acute aortic pathology. Standard branching pattern of the abdominal aorta with solitary  bilateral renal arteries. Moderate mixed calcific atherosclerosis. Review of the MIP images confirms the above findings. NON-VASCULAR Hepatobiliary: Coarse, nodular cirrhotic morphology of the liver, again with multiple hyperenhancing lesions throughout the liver and extensive portal venous thrombus, better assessed by prior MR (series 11, image 124). No gallstones, gallbladder wall thickening, or biliary dilatation. Pancreas: Unremarkable. No pancreatic ductal dilatation or surrounding inflammatory changes. Spleen: Normal in size without significant abnormality. Adrenals/Urinary Tract: Adrenal glands are unremarkable. Kidneys are normal, without renal calculi, solid lesion, or hydronephrosis. Bladder is unremarkable. Stomach/Bowel: Stomach is within normal limits. Appendix appears normal. No evidence of bowel wall thickening, distention, or inflammatory changes. Lymphatic: No enlarged abdominal or pelvic lymph nodes. Reproductive: No mass or other significant abnormality. Other: No abdominal wall hernia or abnormality. Moderate volume simple fluid attenuation ascites throughout the abdomen and pelvis. Musculoskeletal: No acute osseous findings. IMPRESSION: 1. No evidence of aortic aneurysm, dissection, or other acute aortic pathology. 2. No evidence of pulmonary embolism on limited non-tailored examination. 3. New small right pleural effusion. Similar dependent bibasilar atelectasis or consolidation. 4. Coarse, nodular cirrhotic morphology of the liver, again with multiple hyperenhancing lesions throughout the liver and extensive portal venous thrombus, better assessed by prior MR and presumably reflecting multifocal hepatocellular carcinoma. 5. Moderate volume simple fluid attenuation ascites throughout the abdomen and pelvis. 6. Coronary artery disease. Aortic Atherosclerosis (ICD10-I70.0). Electronically Signed   By: Jearld Lesch M.D.   On: 09/13/2022 08:39   DG Chest Port 1 View  Result Date:  09/13/2022 CLINICAL DATA:  Code STEMI EXAM: PORTABLE CHEST 1 VIEW COMPARISON:  09/08/2022 radiograph and chest CT FINDINGS: Low volume chest with hazy density at the bases, greater on the right. Aortic tortuosity accentuated by rotation, unchanged. No edema, effusion, or pneumothorax. IMPRESSION: Low volume chest with atelectasis at the bases, stable from recent chest CT. Electronically Signed   By: Tiburcio Pea M.D.   On: 09/13/2022 08:12   CARDIAC CATHETERIZATION  Result Date: 09/10/2022 Mild nonobstructive CAD Normal LVEDP 7 mm Hg Plan: medical management  ECHOCARDIOGRAM COMPLETE  Result Date: 09/09/2022    ECHOCARDIOGRAM REPORT   Patient Name:   VEGAS COFFIN Date of Exam: 09/09/2022 Medical Rec #:  [id number]       Height:       66.0 in Accession #:    [id number]      Weight:       142.2 lb Date of Birth:  [birth year]       BSA:          1.730 m Patient Age:  72 years        BP:           101/78 mmHg Patient Gender: F               HR:           81 bpm. Exam Location:  Inpatient Procedure: 2D Echo, Cardiac Doppler and Color Doppler Indications:    Chest Pain  History:        Patient has prior history of Echocardiogram examinations, most                 recent 11/24/2015. COPD; Risk Factors:Dyslipidemia and                 Hypertension.  Sonographer:    Harriette Bouillon RDCS Referring Phys: [id number] ANGELA NICOLE DUKE IMPRESSIONS  1. Technically difficult study with poor visualization of cardiac structures.  2. Left ventricular ejection fraction, by estimation, is 60 to 65%. The left ventricle has normal function. The left ventricle has no regional wall motion abnormalities.  3. Right ventricular systolic function is normal. The right ventricular size is normal. There is normal pulmonary artery systolic pressure.  4. The mitral valve is normal in structure. No evidence of mitral valve regurgitation. No evidence of mitral stenosis.  5. The aortic valve is normal in structure. There is mild calcification of  the aortic valve. Aortic valve regurgitation is trivial. No aortic stenosis is present.  6. The inferior vena cava is normal in size with greater than 50% respiratory variability, suggesting right atrial pressure of 3 mmHg. FINDINGS  Left Ventricle: Left ventricular ejection fraction, by estimation, is 60 to 65%. The left ventricle has normal function. The left ventricle has no regional wall motion abnormalities. The left ventricular internal cavity size was normal in size. There is  no left ventricular hypertrophy. Left ventricular diastolic function could not be evaluated due to nondiagnostic images. Right Ventricle: The right ventricular size is normal. No increase in right ventricular wall thickness. Right ventricular systolic function is normal. There is normal pulmonary artery systolic pressure. The tricuspid regurgitant velocity is 2.08 m/s, and  with an assumed right atrial pressure of 3 mmHg, the estimated right ventricular systolic pressure is 20.3 mmHg. Left Atrium: Left atrial size was normal in size. Right Atrium: Right atrial size was normal in size. Pericardium: There is no evidence of pericardial effusion. Mitral Valve: The mitral valve is normal in structure. No evidence of mitral valve regurgitation. No evidence of mitral valve stenosis. Tricuspid Valve: The tricuspid valve is normal in structure. Tricuspid valve regurgitation is trivial. No evidence of tricuspid stenosis. Aortic Valve: The aortic valve is normal in structure. There is mild calcification of the aortic valve. Aortic valve regurgitation is trivial. No aortic stenosis is present. Pulmonic Valve: The pulmonic valve was normal in structure. Pulmonic valve regurgitation is not visualized. No evidence of pulmonic stenosis. Aorta: The aortic root is normal in size and structure. Venous: The inferior vena cava is normal in size with greater than 50% respiratory variability, suggesting right atrial pressure of 3 mmHg. IAS/Shunts: No atrial  level shunt detected by color flow Doppler.  LEFT VENTRICLE PLAX 2D LVIDd:         4.10 cm   Diastology LVIDs:         2.87 cm   LV e' medial:    5.44 cm/s LV IVS:        0.90 cm   LV E/e' medial:  8.5 LVOT diam:  1.70 cm   LV e' lateral:   5.11 cm/s LV SV:         46        LV E/e' lateral: 9.1 LV SV Index:   27 LVOT Area:     2.27 cm  RIGHT VENTRICLE            IVC RV S prime:     9.25 cm/s  IVC diam: 1.70 cm TAPSE (M-mode): 1.5 cm LEFT ATRIUM             Index        RIGHT ATRIUM          Index LA diam:        3.00 cm 1.73 cm/m   RA Area:     6.80 cm LA Vol (A2C):   20.4 ml 11.79 ml/m  RA Volume:   12.50 ml 7.23 ml/m LA Vol (A4C):   25.0 ml 14.45 ml/m LA Biplane Vol: 23.6 ml 13.64 ml/m  AORTIC VALVE LVOT Vmax:   136.00 cm/s LVOT Vmean:  79.300 cm/s LVOT VTI:    0.202 m  AORTA Ao Root diam: 3.10 cm MITRAL VALVE               TRICUSPID VALVE MV Area (PHT): 2.48 cm    TR Peak grad:   17.3 mmHg MV Decel Time: 306 msec    TR Vmax:        208.00 cm/s MV E velocity: 46.50 cm/s MV A velocity: 90.30 cm/s  SHUNTS MV E/A ratio:  0.51        Systemic VTI:  0.20 m                            Systemic Diam: 1.70 cm Aditya Sabharwal Electronically signed by Dorthula Nettles Signature Date/Time: 09/09/2022/2:15:48 PM    Final      Assessment and Plan:   Chest pain Abnormal ECG Elevated troponin  Patient returned to the [facility] ED overnight with recurrent chest pain and shortness of breath, made worse when supine and improved sitting up. Of note, just had ischemic evaluation on 09/10/22 due to ST elevation and was found to have mild, non-obstructive disease. Troponin today 1521->994. ECG with significant ST segment elevation in V1-V3.  Although patient with ECG concerning for acute ischemia, given 09/10/22 LHC with non-obstructive disease, not felt to represent STEMI per STEMI MD Dr. Allyson Sabal. On exam, patient very clearly has exacerbation of pain when supine, improves with sitting up. Strong suspicion for  pericarditis/myocarditis. This may be a result of patient recently starting Durvalumab and Imjudo on 08/15/22 for treatment of hepatocellular carcinoma. Limited echocardiogram does show slight reduction in LVEF when compared to 7/30 study, now 50-55% with anterior septum, mid inferoseptal segment, and basal inferoseptal segment hypokinesis. Reviewed echo read with Dr. Nelly Laurence. We will continue heparin to facilitate intervention if clinical condition changes and continue workup for myocarditis/pericarditis. Repeat ECG Check ESR and CRP Check STAT cardiac MRI. If labs/imaging consistent with myocarditis/pericarditis, use of NSAIDs will be limited by abnormal liver function.  Cardiology will continue to follow this patient very closely.   Dyspnea  Patient reporting shortness of breath in association with her chest pain. CXR and CT imaging show small right pleural effusion. Physical exam with diminished breath sounds in lower lobes. TTE noted LVEF 50-55%, decreased from preserved on 09/09/22. IVC with <50% respiratory variability and RA pressure . BNP 278.5.  IV lasix  20mg  x1 dose given elevated BNP and RA pressure.   Hypertension  Continue home Amlodipine 10mg , metoprolol Tartrate 25mg  BID.  Peripheral Artery disease  Continue Aspirin, Cilostazol, Atorvastatin. Also on Eliquis with recent poral vein thrombosis.   Portal vein thrombosis  Patient has been on Eliquis. With potential for interventional needs, recommend continuation of heparin at this time.   Per primary team: Hepatocellular carcinoma Hepatitis C   Risk Assessment/Risk Scores:                For questions or updates, please contact [facility] Please consult [email or web address] for contact info under    Signed, Perlie Gold, PA-C  09/13/2022 10:58 AM

## 2022-09-13 NOTE — ED Triage Notes (Addendum)
Patient came from home by POV with c/o SOB. She states she was recently seen for same complaint. She also voiced she used an old inhaler (Spiriva respimat) that made symptoms worse and uses Incruse ellipta.

## 2022-09-13 NOTE — Progress Notes (Signed)
Given CT chest findnig, immunocompromised status, will start empiric rx for pneumonia.

## 2022-09-13 NOTE — Progress Notes (Signed)
IP PROGRESS NOTE  Subjective:   Christine Hurley is followed by Dr. [name] for hepatocellular carcinoma.  She was treated with durvalumab and tremelimumab on 08/15/2022. She developed pruritus a few weeks following treatment.  She was noted to have ecchymoses on her thighs.  She was treated with Pepcid and over-the-counter Benadryl for the pruritus. She presented to emergency room 09/08/2022 with dyspnea.  The troponin was elevated.  She was diagnosed with an NSTEMI.  She was found to have mild nonobstructive CAD on cardiac catheterization.  She was treated with medical therapy and discharged to home on 09/12/2022. She presented to the emergency room today with dyspnea and anterior chest discomfort.  She describes a heavy upper anterior chest pain that is worse with both sitting and lying.  She feels as though someone is standing on her chest.  An EKG revealed ST elevation in the troponin is significantly elevated. She has been evaluated by cardiology.  They feel an acute coronary syndrome is unlikely.  Cardiology is concerned she has myopericarditis.  An echocardiogram revealed low normal LV function with septal hypokinesis.  She continues to have heavy chest discomfort.  Dyspnea is better while on oxygen. Objective: Vital signs in last 24 hours: Blood pressure (!) 124/91, pulse (!) 49, temperature 97.9 F (36.6 C), temperature source Oral, resp. rate 20, height 5\' 6"  (1.676 m), weight 145 lb 11.6 oz (66.1 kg), SpO2 99%.  Intake/Output from previous day: No intake/output data recorded.  Physical Exam:  HEENT: No thrush Lungs: Scattered end inspiratory coarse rhonchi at the posterior chest bilaterally, no respiratory distress Cardiac: Regular rate and rhythm, no rub Abdomen: No hepatosplenomegaly, mild tenderness in the right subcostal region Extremities: No leg edema Skin: No rash  Lab Results: Recent Labs    09/12/22 0446 09/13/22 0727 09/13/22 0743  WBC 6.7 8.0  --   HGB 11.5* 12.3 10.9*   HCT 35.1* 37.2 32.0*  PLT 235 307  --     BMET Recent Labs    09/12/22 0446 09/13/22 0727 09/13/22 0743  NA 133* 132* 135  K 3.7 4.1 4.1  CL 104 101 106  CO2 20* 22  --   GLUCOSE 104* 109* 93  BUN 19 20 19   CREATININE 0.64 0.69 0.50  CALCIUM 8.4* 8.9  --     Lab Results  Component Value Date   CEA1 2.4 07/19/2022   ZOX096 <2 07/19/2022    Studies/Results: ECHOCARDIOGRAM LIMITED  Result Date: 09/13/2022    ECHOCARDIOGRAM LIMITED REPORT   [name] Date of Exam: 09/13/2022 Medical Rec #:  [id number]       Height:       66.0 in Accession #:    [id number]      Weight:       140.0 lb Date of Birth:  [birth year]       BSA:          1.719 m Patient Age:    72 years        BP:           136/84 mmHg Patient Gender: F               HR:           98 bpm. Exam Location:  Inpatient Procedure: Limited Echo and Limited Color Doppler Indications:     Chest Pain R07.9  History:         Patient has prior history of Echocardiogram examinations, most  recent 09/09/2022. CAD and Previous Myocardial Infarction, PAD,                  Arrythmias:Tachycardia; Risk Factors:Hypertension, Current                  Smoker and Dyslipidemia.  Sonographer:     Lucendia Herrlich Referring Phys:  [id number] Perlie Gold Diagnosing Phys: Weston Brass MD IMPRESSIONS  1. Left ventricular ejection fraction, by estimation, is 50 to 55%. The left ventricle has low normal function. The left ventricle demonstrates regional wall motion abnormalities (see scoring diagram/findings for description).  2. Right ventricular systolic function is mildly reduced. The right ventricular size is normal. The estimated right ventricular systolic pressure is 28.4 mmHg.  3. The mitral valve is grossly normal. Trivial mitral valve regurgitation.  4. The aortic valve is grossly normal. Aortic valve regurgitation is trivial.  5. The inferior vena cava is dilated in size with <50% respiratory variability, suggesting  right atrial pressure of 15 mmHg. FINDINGS  Left Ventricle: Left ventricular ejection fraction, by estimation, is 50 to 55%. The left ventricle has low normal function. The left ventricle demonstrates regional wall motion abnormalities. There is borderline left ventricular hypertrophy.  LV Wall Scoring: The anterior septum, mid inferoseptal segment, and basal inferoseptal segment are hypokinetic. Right Ventricle: The right ventricular size is normal. Right ventricular systolic function is mildly reduced. The tricuspid regurgitant velocity is 1.83 m/s, and with an assumed right atrial pressure of 15 mmHg, the estimated right ventricular systolic pressure is 28.4 mmHg. Pericardium: Trivial pericardial effusion is present. Mitral Valve: The mitral valve is grossly normal. Trivial mitral valve regurgitation. Tricuspid Valve: The tricuspid valve is grossly normal. Tricuspid valve regurgitation is trivial. Aortic Valve: The aortic valve is grossly normal. Aortic valve regurgitation is trivial. Venous: The inferior vena cava is dilated in size with less than 50% respiratory variability, suggesting right atrial pressure of 15 mmHg. LEFT VENTRICLE PLAX 2D LVIDd:         4.00 cm LVIDs:         2.60 cm LV PW:         0.90 cm LV IVS:        1.00 cm  IVC IVC diam: 2.30 cm LEFT ATRIUM         Index LA diam:    3.40 cm 1.98 cm/m  TRICUSPID VALVE TR Peak grad:   13.4 mmHg TR Vmax:        183.00 cm/s Weston Brass MD Electronically signed by Weston Brass MD Signature Date/Time: 09/13/2022/11:20:31 AM    Final (Updated)    CT Angio Chest/Abd/Pel for Dissection W and/or Wo Contrast  Result Date: 09/13/2022 CLINICAL DATA:  Shortness of breath, recent NSTEMI and cardiac catheterization, suspected multifocal hepatocellular carcinoma * Tracking Code: BO * EXAM: CT ANGIOGRAPHY CHEST, ABDOMEN AND PELVIS TECHNIQUE: Non-contrast CT of the chest was initially obtained. Multidetector CT imaging through the chest, abdomen and pelvis was  performed using the standard protocol during bolus administration of intravenous contrast. Multiplanar reconstructed images and MIPs were obtained and reviewed to evaluate the vascular anatomy. RADIATION DOSE REDUCTION: This exam was performed according to the departmental dose-optimization program which includes automated exposure control, adjustment of the mA and/or kV according to patient size and/or use of iterative reconstruction technique. CONTRAST:  OMNIPAQUE IOHEXOL 350 MG/ML SOLN COMPARISON:  CT chest angiogram, 09/08/2022, CT chest abdomen pelvis angiogram, 07/18/2022 MR abdomen, 07/19/2018 FINDINGS: CTA CHEST FINDINGS VASCULAR Aorta: Satisfactory opacification of the aorta.  Normal contour and caliber of the thoracic aorta. No evidence of aneurysm, dissection, or other acute aortic pathology. Moderate mixed calcific atherosclerosis. Cardiovascular: No evidence of pulmonary embolism on limited non-tailored examination. Mild cardiomegaly. Left and right coronary artery calcifications. No pericardial effusion. Review of the MIP images confirms the above findings. NON VASCULAR Mediastinum/Nodes: No enlarged mediastinal, hilar, or axillary lymph nodes. Thyroid gland, trachea, and esophagus demonstrate no significant findings. Lungs/Pleura: New small right pleural effusion. Similar dependent bibasilar atelectasis or consolidation. Musculoskeletal: No chest wall abnormality. No acute osseous findings. Review of the MIP images confirms the above findings. CTA ABDOMEN AND PELVIS FINDINGS VASCULAR Normal contour and caliber of the abdominal aorta. No evidence of aneurysm, dissection, or other acute aortic pathology. Standard branching pattern of the abdominal aorta with solitary bilateral renal arteries. Moderate mixed calcific atherosclerosis. Review of the MIP images confirms the above findings. NON-VASCULAR Hepatobiliary: Coarse, nodular cirrhotic morphology of the liver, again with multiple hyperenhancing  lesions throughout the liver and extensive portal venous thrombus, better assessed by prior MR (series 11, image 124). No gallstones, gallbladder wall thickening, or biliary dilatation. Pancreas: Unremarkable. No pancreatic ductal dilatation or surrounding inflammatory changes. Spleen: Normal in size without significant abnormality. Adrenals/Urinary Tract: Adrenal glands are unremarkable. Kidneys are normal, without renal calculi, solid lesion, or hydronephrosis. Bladder is unremarkable. Stomach/Bowel: Stomach is within normal limits. Appendix appears normal. No evidence of bowel wall thickening, distention, or inflammatory changes. Lymphatic: No enlarged abdominal or pelvic lymph nodes. Reproductive: No mass or other significant abnormality. Other: No abdominal wall hernia or abnormality. Moderate volume simple fluid attenuation ascites throughout the abdomen and pelvis. Musculoskeletal: No acute osseous findings. IMPRESSION: 1. No evidence of aortic aneurysm, dissection, or other acute aortic pathology. 2. No evidence of pulmonary embolism on limited non-tailored examination. 3. New small right pleural effusion. Similar dependent bibasilar atelectasis or consolidation. 4. Coarse, nodular cirrhotic morphology of the liver, again with multiple hyperenhancing lesions throughout the liver and extensive portal venous thrombus, better assessed by prior MR and presumably reflecting multifocal hepatocellular carcinoma. 5. Moderate volume simple fluid attenuation ascites throughout the abdomen and pelvis. 6. Coronary artery disease. Aortic Atherosclerosis (ICD10-I70.0). Electronically Signed   By: Jearld Lesch M.D.   On: 09/13/2022 08:39   DG Chest Port 1 View  Result Date: 09/13/2022 CLINICAL DATA:  Code STEMI EXAM: PORTABLE CHEST 1 VIEW COMPARISON:  09/08/2022 radiograph and chest CT FINDINGS: Low volume chest with hazy density at the bases, greater on the right. Aortic tortuosity accentuated by rotation, unchanged.  No edema, effusion, or pneumothorax. IMPRESSION: Low volume chest with atelectasis at the bases, stable from recent chest CT. Electronically Signed   By: Tiburcio Pea M.D.   On: 09/13/2022 08:12    Medications: I have reviewed the patient's current medications.  Assessment/Plan:  Hepatocellular carcinoma, multifocal liver masses, status post 1 treatment with tremelimumab and durvalumab on 08/15/2022 COPD History of hepatitis C Chronic diarrhea Peripheral arterial disease Admission 09/08/2022 with chest pain and dyspnea, nonobstructive CAD on cardiac catheterization 09/10/2022, treated with medical therapy for NSTEMI Admission 09/13/2022 with recurrent chest pain and dyspnea, ST elevation, elevated troponin  Ms. Aaronson has hepatocellular carcinoma.  She completed 1 treatment with tremelimumab and durvalumab 08/15/2022.  She was admitted earlier this week with chest pain and dyspnea and diagnosed with an NSTEMI, treated with medical therapy.  She Leander Rams presents today with persistent dyspnea and positional chest discomfort.  The troponin is higher compared to earlier this week.  Cardiology has a low  suspicion for an acute coronary syndrome and is concerned the patient has myocarditis.  There is a significant probability of myocarditis.  I agree with steroid treatment and close cardiology follow-up.  Immunotherapy will be placed on hold.  We will consider alternate treatment option for the hepatocellular carcinoma based on her recovery from the acute presentation.  Recommendations: Cardiac monitoring, follow-up of troponin, and medical therapy for heart disease per cardiology Prednisone or methylprednisolone 1-2 mg/kg daily Hold durvalumab  Consider additional immunosuppressive treatments based on response to steroids over the next few days Check CPK to rule out myositis Monitor elevated liver enzymes-potentially increased secondary to hepatocellular carcinoma versus toxicity from  immunotherapy Oncology will continue following her in the hospital, Dr. [name] will return on 09/15/2022.   LOS: 0 days   Thornton Papas, MD   09/13/2022, 5:30 PM

## 2022-09-13 NOTE — ED Provider Notes (Signed)
Patient transferred here from [facility] ED for NSTEMI.  Patient reported she is having increasing shortness of breath since last night.  She reports chest pain when she lays back.  Patient had anterior ST elevations on EKG at [facility] and spoke with cardiology who determined that she is not need cath lab activation.  Initial troponin is 1400.  He was started on heparin drip.  On her arrival here she is hemodynamically stable.  Repeat EKG was performed that continues to show anterior ST elevations with some trace depressions in lead III and aVF.  Repeat troponin is drawn now.  Cardiology will be notified of her arrival. Clinical Course as of 09/13/22 1106  Sat Sep 13, 2022  1004 I spoke with Dr. Nelly Laurence with cardiology who will evaluate. Suspect pericarditis with pain presentation and negative cath 2 days ago. [VK]  1037 Cardiology evaluated the patient at bedside and high concern for pericarditis. With cancer history on chemo recommended admission to medicine and will continue to follow. [VK]  1106 Patient accepted for admission by Dr. Maryjean Ka [VK]    Clinical Course User Index [VK] Rexford Maus, DO      Rexford Maus, [place] 09/13/22 1106

## 2022-09-13 NOTE — H&P (Addendum)
History and Physical    Patient: Christine [facility] WJX:[id number] DOB: [birth year] DOA: 09/13/2022 DOS: the patient was seen and examined on 09/13/2022 PCP: Willow Ora, MD  Patient coming from: Home  Chief Complaint:  Chief Complaint  Patient presents with   Shortness of Breath   HPI: Christine Hurley is a 72 y.o. female with medical history significant of discharge from [facility] long hospital yesterday after she was treated there for suspected NSTEMI.  Patient also had a coronary angiogram done and there was no finding of severe CAD no interventions were done.  Patient was symptomatically feeling much better.  However on returning home yesterday afternoon, patient started feeling some exertional dyspnea and then since last evening has been feeling discomfort in her chest that is not exactly described as a pain by patient.  And is positional.  Son is having to reposition the patient frequently at different degrees of incline/rest of upper back to keep the patient comfortable.  There has been no fever no cough no vomiting or diarrhea no loss of consciousness no palpitations.  Patient shortness of breath has progressed such that she is short of breath even at rest now.  Patient returned to [facility], ER initially today.  Workup there showed a worsening troponin level as well as new EKG changes.  Patient was subsequently transferred to [facility], ER and has been discussed with cardiology.  See below.  Medical admission is sought  Patient is currently being maintained on 2 to 3 L/min of supplementary oxygen, reports ongoing sensation of shortness of breath causing her to interrupt her speech to breath.  Her chest discomfort is mostly gone.  Review of Systems: As mentioned in the history of present illness. All other systems reviewed and are negative. Past Medical History:  Diagnosis Date   Abnormal nuclear stress test February '13   Small, partially reversible inferolateral ischemia. Marked  hypertensive response to exercise. Low risk.   Arthritis    Chest pain with minimal risk for cardiac etiology February 2013   Evaluated with echocardiogram and Myoview, do   COPD (chronic obstructive pulmonary disease) (HCC)    Depression    Hepatitis C    Hypertension    Osteopenia of spine 05/22/2016   PAD (peripheral artery disease) (HCC) 10/01/2021   ABI: monophasic and decreased ABIs. Refer to vascular   Right tibial fracture     Past Surgical History:  Procedure Laterality Date   DOPPLER ECHOCARDIOGRAPHY  03/26/2011   LVEF>55% normal LV wall thickness, normal LA, mild aortic sclerosis with trace to mild AI, mild to moderate TR, RVSP of , RA pressure about 5 mmHg   LEFT HEART CATH AND CORONARY ANGIOGRAPHY N/A 09/10/2022   Procedure: LEFT HEART CATH AND CORONARY ANGIOGRAPHY;  Surgeon: Swaz[place], Peter M, MD;  Location: [facility] INVASIVE CV LAB;  Service: Cardiovascular;  Laterality: N/A;   NM MYOVIEW LTD  03/27/11   post stress ejection fraction is 72%, abnormal myocardial perfusion study, this is a low risk scan   TIBIA FRACTURE SURGERY Right 09/2008   fracture of right lower leg with open reduction and internal fixation   Social History:  reports that she has been smoking cigarettes. She has a 11.3 pack-year smoking history. She has never used smokeless tobacco. She reports current alcohol use of about 2.0 standard drinks of alcohol per week. She reports that she does not currently use drugs after having used the following drugs: Marijuana. Advised to quit tobacco. Allergies  Allergen Reactions   Famotidine     "  Made me tired and weak"    Family History  Problem Relation Age of Onset   Asthma Mother    Cancer Mother        Brain   Cancer - Prostate Father    Cancer Maternal Grandmother        GYN cancer    Prior to Admission medications   Medication Sig Start Date End Date Taking? Authorizing Provider  albuterol (VENTOLIN HFA) 108 (90 Base) MCG/ACT inhaler INHALE 1 TO 2  PUFFS INTO THE LUNGS EVERY 6 HOURS AS NEEDED FOR WHEEZING OR SHORTNESS OF BREATH Patient taking differently: Inhale 2 puffs into the lungs every 6 (six) hours as needed for wheezing or shortness of breath. 07/10/20  Yes Willow Ora, MD  amLODipine (NORVASC) 10 MG tablet Take 1 tablet (10 mg total) by mouth daily. 07/28/22  Yes Willow Ora, MD  apixaban (ELIQUIS) 5 MG TABS tablet Take 1 tablet (5mg ) by mouth twice daily Patient taking differently: Take 5 mg by mouth in the morning and at bedtime. 07/21/22  Yes Ghimire, Werner Lean, MD  aspirin EC 81 MG tablet Take 1 tablet (81 mg total) by mouth daily. Swallow whole. 09/12/22 09/12/23 Yes Willeen Niece, MD  atorvastatin (LIPITOR) 20 MG tablet Take 1 tablet (20 mg total) by mouth at bedtime. 09/12/22 10/12/22 Yes Willeen Niece, MD  cilostazol (PLETAL) 100 MG tablet Take 100 mg by mouth 2 (two) times daily.   Yes [provider]  hydrocortisone 1 % ointment Apply 1 Application topically 2 (two) times daily. Patient taking differently: Apply 1 Application topically See admin instructions. Apply to affected areas 2 times a day 09/04/22  Yes Walisiewicz, Kaitlyn E, PA-C  Iron-Vitamins (GERITOL COMPLETE) TABS Take 1 tablet by mouth daily with breakfast.   Yes [provider]  metoprolol tartrate (LOPRESSOR) 25 MG tablet Take 1 tablet (25 mg total) by mouth 2 (two) times daily. 09/12/22 10/12/22 Yes Willeen Niece, MD  SPIRIVA RESPIMAT 2.5 MCG/ACT AERS INHALE 2 PUFFS INTO THE LUNGS DAILY Patient not taking: Reported on 09/13/2022 02/19/22   Willow Ora, MD    Physical Exam: Vitals:   09/13/22 0722 09/13/22 0900 09/13/22 1100 09/13/22 1223  BP: (!) 154/107 136/84 119/84 113/69  Pulse: 93 (!) 101 94 93  Resp: (!) 29 (!) 23 (!) 21 17  Temp: 97.8 F (36.6 C)   97.9 F (36.6 C)  TempSrc: Oral   Oral  SpO2: 99% 98% 100% 100%  Weight:      Height:       General: No distress is apparent on initial encounter with the patient.  She seems to  be restful on supplementary oxygen. Respiratory exam: Bilateral intravesicular Cardiovascular exam S1-S2 normal Abdomen all quadrants soft nontender Extremities warm without edema no focal deficit Data Reviewed:  Labs on Admission:  Results for orders placed or performed during the hospital encounter of 09/13/22 (from the past 24 hour(s))  Hemoglobin A1c     Status: Abnormal   Collection Time: 09/13/22  7:27 AM  Result Value Ref Range   Hgb A1c MFr Bld 6.2 (H) 4.8 - 5.6 %   Mean Plasma Glucose 131.24 mg/dL  CBC with Differential/Platelet     Status: Abnormal   Collection Time: 09/13/22  7:27 AM  Result Value Ref Range   WBC 8.0 4.0 - 10.5 K/uL   RBC 4.17 3.87 - 5.11 MIL/uL   Hemoglobin 12.3 12.0 - 15.0 g/dL   HCT 56.3 87.5 - 64.3 %  MCV 89.2 80.0 - 100.0 fL   MCH 29.5 26.0 - 34.0 pg   MCHC 33.1 30.0 - 36.0 g/dL   RDW 81.1 (H) 91.4 - 78.2 %   Platelets 307 150 - 400 K/uL   nRBC 0.0 0.0 - 0.2 %   Neutrophils Relative % 84 %   Neutro Abs 6.8 1.7 - 7.7 K/uL   Lymphocytes Relative 8 %   Lymphs Abs 0.6 (L) 0.7 - 4.0 K/uL   Monocytes Relative 6 %   Monocytes Absolute 0.4 0.1 - 1.0 K/uL   Eosinophils Relative 1 %   Eosinophils Absolute 0.0 0.0 - 0.5 K/uL   Basophils Relative 0 %   Basophils Absolute 0.0 0.0 - 0.1 K/uL   Immature Granulocytes 1 %   Abs Immature Granulocytes 0.04 0.00 - 0.07 K/uL  Protime-INR     Status: Abnormal   Collection Time: 09/13/22  7:27 AM  Result Value Ref Range   Prothrombin Time 17.5 (H) 11.4 - 15.2 seconds   INR 1.4 (H) 0.8 - 1.2  APTT     Status: None   Collection Time: 09/13/22  7:27 AM  Result Value Ref Range   aPTT 31 24 - 36 seconds  Comprehensive metabolic panel     Status: Abnormal   Collection Time: 09/13/22  7:27 AM  Result Value Ref Range   Sodium 132 (L) 135 - 145 mmol/L   Potassium 4.1 3.5 - 5.1 mmol/L   Chloride 101 98 - 111 mmol/L   CO2 22 22 - 32 mmol/L   Glucose, Bld 109 (H) 70 - 99 mg/dL   BUN 20 8 - 23 mg/dL    Creatinine, Ser 9.56 0.44 - 1.00 mg/dL   Calcium 8.9 8.9 - 21.3 mg/dL   Total Protein 7.5 6.5 - 8.1 g/dL   Albumin 3.3 (L) 3.5 - 5.0 g/dL   AST 086 (H) 15 - 41 U/L   ALT 263 (H) 0 - 44 U/L   Alkaline Phosphatase 198 (H) 38 - 126 U/L   Total Bilirubin 0.8 0.3 - 1.2 mg/dL   GFR, Estimated >57 >84 mL/min   Anion gap 9 5 - 15  Troponin I (High Sensitivity)     Status: Abnormal   Collection Time: 09/13/22  7:27 AM  Result Value Ref Range   Troponin I (High Sensitivity) 1,521 (HH) <18 ng/L  Lipid panel     Status: None   Collection Time: 09/13/22  7:27 AM  Result Value Ref Range   Cholesterol 159 0 - 200 mg/dL   Triglycerides 95 <696 mg/dL   HDL 53 >29 mg/dL   Total CHOL/HDL Ratio 3.0 RATIO   VLDL 19 0 - 40 mg/dL   LDL Cholesterol 87 0 - 99 mg/dL  I-Stat CG4 Lactic Acid, ED     Status: None   Collection Time: 09/13/22  7:43 AM  Result Value Ref Range   Lactic Acid, Venous 1.3 0.5 - 1.9 mmol/L  I-stat chem 8, ed     Status: Abnormal   Collection Time: 09/13/22  7:43 AM  Result Value Ref Range   Sodium 135 135 - 145 mmol/L   Potassium 4.1 3.5 - 5.1 mmol/L   Chloride 106 98 - 111 mmol/L   BUN 19 8 - 23 mg/dL   Creatinine, Ser 5.28 0.44 - 1.00 mg/dL   Glucose, Bld 93 70 - 99 mg/dL   Calcium, Ion 4.13 (L) 1.15 - 1.40 mmol/L   TCO2 18 (L) 22 - 32 mmol/L  Hemoglobin 10.9 (L) 12.0 - 15.0 g/dL   HCT 13.2 (L) 44.0 - 10.2 %  Brain natriuretic peptide     Status: Abnormal   Collection Time: 09/13/22  8:41 AM  Result Value Ref Range   B Natriuretic Peptide 278.5 (H) 0.0 - 100.0 pg/mL  Troponin I (High Sensitivity)     Status: Abnormal   Collection Time: 09/13/22  9:27 AM  Result Value Ref Range   Troponin I (High Sensitivity) 994 (HH) <18 ng/L  C-reactive protein     Status: None   Collection Time: 09/13/22 11:13 AM  Result Value Ref Range   CRP 0.7 <1.0 mg/dL   Basic Metabolic Panel: Recent Labs  Lab 09/09/22 0215 09/10/22 0709 09/11/22 0500 09/12/22 0446 09/13/22 0727  09/13/22 0743  NA 135 137 136 133* 132* 135  K 3.3* 4.5 3.6 3.7 4.1 4.1  CL 108 107 105 104 101 106  CO2 18* 21* 20* 20* 22  --   GLUCOSE 109* 101* 96 104* 109* 93  BUN 24* 22 19 19 20 19   CREATININE 0.86 0.69 0.62 0.64 0.69 0.50  CALCIUM 8.4* 9.0 8.9 8.4* 8.9  --   MG 1.9  --   --  2.0  --   --   PHOS  --   --   --  4.3  --   --    Liver Function Tests: Recent Labs  Lab 09/09/22 0215 09/11/22 0500 09/12/22 0446 09/13/22 0727  AST 261* 319* 252* 288*  ALT 233* 332* 245* 263*  ALKPHOS 220* 236* 185* 198*  BILITOT 0.4 0.9 1.0 0.8  PROT 6.4* 7.5 6.5 7.5  ALBUMIN 3.0* 3.4* 3.0* 3.3*   No results for input(s): "LIPASE", "AMYLASE" in the last 168 hours. No results for input(s): "AMMONIA" in the last 168 hours. CBC: Recent Labs  Lab 09/08/22 1334 09/09/22 0215 09/10/22 0709 09/11/22 0500 09/12/22 0446 09/13/22 0727 09/13/22 0743  WBC 7.3 6.7 6.6 7.3 6.7 8.0  --   NEUTROABS 6.0  --   --   --   --  6.8  --   HGB 12.3 10.8* 11.6* 12.5 11.5* 12.3 10.9*  HCT 37.3 32.7* 36.1 38.5 35.1* 37.2 32.0*  MCV 88.4 88.9 92.6 91.2 90.7 89.2  --   PLT 289 280 263 269 235 307  --    Cardiac Enzymes: Recent Labs  Lab 09/08/22 2057 09/08/22 2140 09/09/22 0215 09/13/22 0727 09/13/22 0927  TROPONINIHS 467* 472* 483* 1,521* 994*    BNP (last 3 results) No results for input(s): "PROBNP" in the last 8760 hours. CBG: No results for input(s): "GLUCAP" in the last 168 hours.  Radiological Exams on Admission:  ECHOCARDIOGRAM LIMITED  Result Date: 09/13/2022    ECHOCARDIOGRAM LIMITED REPORT   Patient Name:   SWAY GUTTIERREZ Date of Exam: 09/13/2022 Medical Rec #:  [id number]       Height:       66.0 in Accession #:    [id number]      Weight:       140.0 lb Date of Birth:  February 01, 1951       BSA:          1.719 m Patient Age:    72 years        BP:           136/84 mmHg Patient Gender: F               HR:  98 bpm. Exam Location:  Inpatient Procedure: Limited Echo and Limited Color  Doppler Indications:     Chest Pain R07.9  History:         Patient has prior history of Echocardiogram examinations, most                  recent 09/09/2022. CAD and Previous Myocardial Infarction, PAD,                  Arrythmias:Tachycardia; Risk Factors:Hypertension, Current                  Smoker and Dyslipidemia.  Sonographer:     Lucendia Herrlich Referring Phys:  [id number] Perlie Gold Diagnosing Phys: Weston Brass MD IMPRESSIONS  1. Left ventricular ejection fraction, by estimation, is 50 to 55%. The left ventricle has low normal function. The left ventricle demonstrates regional wall motion abnormalities (see scoring diagram/findings for description).  2. Right ventricular systolic function is mildly reduced. The right ventricular size is normal. The estimated right ventricular systolic pressure is 28.4 mmHg.  3. The mitral valve is grossly normal. Trivial mitral valve regurgitation.  4. The aortic valve is grossly normal. Aortic valve regurgitation is trivial.  5. The inferior vena cava is dilated in size with <50% respiratory variability, suggesting right atrial pressure of 15 mmHg. FINDINGS  Left Ventricle: Left ventricular ejection fraction, by estimation, is 50 to 55%. The left ventricle has low normal function. The left ventricle demonstrates regional wall motion abnormalities. There is borderline left ventricular hypertrophy.  LV Wall Scoring: The anterior septum, mid inferoseptal segment, and basal inferoseptal segment are hypokinetic. Right Ventricle: The right ventricular size is normal. Right ventricular systolic function is mildly reduced. The tricuspid regurgitant velocity is 1.83 m/s, and with an assumed right atrial pressure of 15 mmHg, the estimated right ventricular systolic pressure is 28.4 mmHg. Pericardium: Trivial pericardial effusion is present. Mitral Valve: The mitral valve is grossly normal. Trivial mitral valve regurgitation. Tricuspid Valve: The tricuspid valve is grossly  normal. Tricuspid valve regurgitation is trivial. Aortic Valve: The aortic valve is grossly normal. Aortic valve regurgitation is trivial. Venous: The inferior vena cava is dilated in size with less than 50% respiratory variability, suggesting right atrial pressure of 15 mmHg. LEFT VENTRICLE PLAX 2D LVIDd:         4.00 cm LVIDs:         2.60 cm LV PW:         0.90 cm LV IVS:        1.00 cm  IVC IVC diam: 2.30 cm LEFT ATRIUM         Index LA diam:    3.40 cm 1.98 cm/m  TRICUSPID VALVE TR Peak grad:   13.4 mmHg TR Vmax:        183.00 cm/s Weston Brass MD Electronically signed by Weston Brass MD Signature Date/Time: 09/13/2022/11:20:31 AM    Final (Updated)    CT Angio Chest/Abd/Pel for Dissection W and/or Wo Contrast  Result Date: 09/13/2022 CLINICAL DATA:  Shortness of breath, recent NSTEMI and cardiac catheterization, suspected multifocal hepatocellular carcinoma * Tracking Code: BO * EXAM: CT ANGIOGRAPHY CHEST, ABDOMEN AND PELVIS TECHNIQUE: Non-contrast CT of the chest was initially obtained. Multidetector CT imaging through the chest, abdomen and pelvis was performed using the standard protocol during bolus administration of intravenous contrast. Multiplanar reconstructed images and MIPs were obtained and reviewed to evaluate the vascular anatomy. RADIATION DOSE REDUCTION: This exam was performed according to the departmental dose-optimization  program which includes automated exposure control, adjustment of the mA and/or kV according to patient size and/or use of iterative reconstruction technique. CONTRAST:  OMNIPAQUE IOHEXOL 350 MG/ML SOLN COMPARISON:  CT chest angiogram, 09/08/2022, CT chest abdomen pelvis angiogram, 07/18/2022 MR abdomen, 07/19/2018 FINDINGS: CTA CHEST FINDINGS VASCULAR Aorta: Satisfactory opacification of the aorta. Normal contour and caliber of the thoracic aorta. No evidence of aneurysm, dissection, or other acute aortic pathology. Moderate mixed calcific atherosclerosis.  Cardiovascular: No evidence of pulmonary embolism on limited non-tailored examination. Mild cardiomegaly. Left and right coronary artery calcifications. No pericardial effusion. Review of the MIP images confirms the above findings. NON VASCULAR Mediastinum/Nodes: No enlarged mediastinal, hilar, or axillary lymph nodes. Thyroid gland, trachea, and esophagus demonstrate no significant findings. Lungs/Pleura: New small right pleural effusion. Similar dependent bibasilar atelectasis or consolidation. Musculoskeletal: No chest wall abnormality. No acute osseous findings. Review of the MIP images confirms the above findings. CTA ABDOMEN AND PELVIS FINDINGS VASCULAR Normal contour and caliber of the abdominal aorta. No evidence of aneurysm, dissection, or other acute aortic pathology. Standard branching pattern of the abdominal aorta with solitary bilateral renal arteries. Moderate mixed calcific atherosclerosis. Review of the MIP images confirms the above findings. NON-VASCULAR Hepatobiliary: Coarse, nodular cirrhotic morphology of the liver, again with multiple hyperenhancing lesions throughout the liver and extensive portal venous thrombus, better assessed by prior MR (series 11, image 124). No gallstones, gallbladder wall thickening, or biliary dilatation. Pancreas: Unremarkable. No pancreatic ductal dilatation or surrounding inflammatory changes. Spleen: Normal in size without significant abnormality. Adrenals/Urinary Tract: Adrenal glands are unremarkable. Kidneys are normal, without renal calculi, solid lesion, or hydronephrosis. Bladder is unremarkable. Stomach/Bowel: Stomach is within normal limits. Appendix appears normal. No evidence of bowel wall thickening, distention, or inflammatory changes. Lymphatic: No enlarged abdominal or pelvic lymph nodes. Reproductive: No mass or other significant abnormality. Other: No abdominal wall hernia or abnormality. Moderate volume simple fluid attenuation ascites throughout  the abdomen and pelvis. Musculoskeletal: No acute osseous findings. IMPRESSION: 1. No evidence of aortic aneurysm, dissection, or other acute aortic pathology. 2. No evidence of pulmonary embolism on limited non-tailored examination. 3. New small right pleural effusion. Similar dependent bibasilar atelectasis or consolidation. 4. Coarse, nodular cirrhotic morphology of the liver, again with multiple hyperenhancing lesions throughout the liver and extensive portal venous thrombus, better assessed by prior MR and presumably reflecting multifocal hepatocellular carcinoma. 5. Moderate volume simple fluid attenuation ascites throughout the abdomen and pelvis. 6. Coronary artery disease. Aortic Atherosclerosis (ICD10-I70.0). Electronically Signed   By: Jearld Lesch M.D.   On: 09/13/2022 08:39   DG Chest Port 1 View  Result Date: 09/13/2022 CLINICAL DATA:  Code STEMI EXAM: PORTABLE CHEST 1 VIEW COMPARISON:  09/08/2022 radiograph and chest CT FINDINGS: Low volume chest with hazy density at the bases, greater on the right. Aortic tortuosity accentuated by rotation, unchanged. No edema, effusion, or pneumothorax. IMPRESSION: Low volume chest with atelectasis at the bases, stable from recent chest CT. Electronically Signed   By: Tiburcio Pea M.D.   On: 09/13/2022 08:12    EKG: Independently reviewed. Down sloping ST elvation V I II III   Assessment and Plan: * Myocarditis [facility]) Patient initially presented to [name], ER on September 08, 2022 with complaints of shortness of breath on exertion with finding of troponin elevation and diagnosis of NSTEMI.  At the time patient had a coronary angiogram done which was notable for rather clean coronaries.  Patient was discharged home on September 12, 2022.  Unfortunately  patient had persistent symptoms of chest discomfort and shortness of breath at home.  Patient returned to [name], ER on September 13, 2022 with finding of new ST-T wave changes and worse troponin elevation.   Patient is transferred to [facility], ER.  At this time clinical impression is myocarditis, likely related to chemotherapeutic agents given, see below.  I will also check viral panel. HIV. HCV itself can cause myocarditis. Blood culture.  Fluid overload Mild pleural effusion on CAT scan as well as a small amount of simple fluid attenuation ascites throughout abdomen and pelvis.  Patient is symptomatic in terms of sensation of shortness of breath.  I will give a dose of Lasix and monitor clinically  Hepatocellular carcinoma (HCC) Durvalumab and tremelimumab have been reported to cause myocarditis and pericarditis . I spoke with dr. Truett Perna of oncology support to manage this. Case reports of support steroid use for  managing this situation, however, concensus guidelines are not available due to the rarity of this complication. Will give 1 gm methylprednisolone tonight. Patient seems to have HCV mentioned in the chart which will need monitoring. I will check HCV levels as well.  Portal vein thrombosis This is known since at least July 19, 2022.  Continue with heparin infusion at this time.  Cardiology not raising any objection to same  Cigarette nicotine dependence with nicotine-induced disorder Cell to quit, nicotine replacement therapy, nurse to provide smoking education  Chronic hepatitis C without hepatic coma (HCC) Monitoring LFTs      Advance Care Planning:   Code Status: Full Code   Consults: cardiology on board. I will seek oncology input as well.  Family Communication: family at bedside during this encounter.  Severity of Illness: The appropriate patient status for this patient is INPATIENT. Inpatient status is judged to be reasonable and necessary in order to provide the required intensity of service to ensure the patient's safety. The patient's presenting symptoms, physical exam findings, and initial radiographic and laboratory data in the context of their chronic comorbidities  is felt to place them at high risk for further clinical deterioration. Furthermore, it is not anticipated that the patient will be medically stable for discharge from the hospital within 2 midnights of admission.   * I certify that at the point of admission it is my clinical judgment that the patient will require inpatient hospital care spanning beyond 2 midnights from the point of admission due to high intensity of service, high risk for further deterioration and high frequency of surveillance required.*  Author: Nolberto Hanlon, MD 09/13/2022 2:42 PM  For on call review [email or web address].

## 2022-09-13 NOTE — ED Notes (Signed)
Report given to Asher Muir at [facility] ED.

## 2022-09-13 NOTE — Progress Notes (Signed)
Echocardiogram 2D Echocardiogram has been performed.  Lucendia Herrlich 09/13/2022, 11:07 AM

## 2022-09-13 NOTE — Assessment & Plan Note (Addendum)
Patient initially presented to [facility] ER on September 08, 2022 with complaints of shortness of breath on exertion with finding of troponin elevation and diagnosis of NSTEMI.  At the time patient had a coronary angiogram done which was notable for rather clean coronaries.  Patient was discharged home on September 12, 2022.  Unfortunately patient had persistent symptoms of chest discomfort and shortness of breath at home.  Patient returned to [facility] ER on September 13, 2022 with finding of new ST-T wave changes and worse troponin elevation.  Patient is transferred to [facility], ER.  At this time clinical impression is myocarditis, likely related to chemotherapeutic agents given, see below.  I will also check viral panel. HIV. HCV itself can cause myocarditis. Blood culture.

## 2022-09-13 NOTE — ED Provider Notes (Signed)
[place] EMERGENCY DEPARTMENT AT [facility] Provider Note   CSN: [id number] Arrival date & time: 09/13/22  1191     History  Chief Complaint  Patient presents with   Shortness of Breath    Christine Hurley is a 72 y.o. female.  72 year old female with history of NSTEMI presents with chest pain x 4 hours.  Patient states that it is a pressure in her chest which is worse with lying flat.  Has been associate with shortness of breath.  States pressure also gets worse when she walks.  Used her home inhaler without relief.  Denies any palpitations.  No history of presyncope.  Review of old records show that she did not have a stent placed.       Home Medications Prior to Admission medications   Medication Sig Start Date End Date Taking? Authorizing Provider  albuterol (VENTOLIN HFA) 108 (90 Base) MCG/ACT inhaler INHALE 1 TO 2 PUFFS INTO THE LUNGS EVERY 6 HOURS AS NEEDED FOR WHEEZING OR SHORTNESS OF BREATH Patient taking differently: Inhale 2 puffs into the lungs every 6 (six) hours as needed for wheezing or shortness of breath. 07/10/20   Willow Ora, MD  amLODipine (NORVASC) 10 MG tablet Take 1 tablet (10 mg total) by mouth daily. 07/28/22   Willow Ora, MD  apixaban (ELIQUIS) 5 MG TABS tablet Take 1 tablet (5mg ) by mouth twice daily Patient taking differently: Take 5 mg by mouth in the morning and at bedtime. 07/21/22   Ghimire, Werner Lean, MD  aspirin EC 81 MG tablet Take 1 tablet (81 mg total) by mouth daily. Swallow whole. 09/12/22 09/12/23  Willeen Niece, MD  atorvastatin (LIPITOR) 20 MG tablet Take 1 tablet (20 mg total) by mouth at bedtime. 09/12/22 10/12/22  Willeen Niece, MD  cilostazol (PLETAL) 100 MG tablet Take 100 mg by mouth 2 (two) times daily.    [provider]  hydrocortisone 1 % ointment Apply 1 Application topically 2 (two) times daily. Patient taking differently: Apply 1 Application topically See admin instructions. Apply to affected areas 2  times a day 09/04/22   Shanon Ace, PA-C  Iron-Vitamins (GERITOL COMPLETE) TABS Take 1 tablet by mouth daily with breakfast.    [provider]  metoprolol tartrate (LOPRESSOR) 25 MG tablet Take 1 tablet (25 mg total) by mouth 2 (two) times daily. 09/12/22 10/12/22  Willeen Niece, MD  SPIRIVA RESPIMAT 2.5 MCG/ACT AERS INHALE 2 PUFFS INTO THE LUNGS DAILY Patient taking differently: Inhale 1-2 puffs into the lungs daily as needed (for shortness of breath). 02/19/22   Willow Ora, MD      Allergies    Famotidine    Review of Systems   Review of Systems  All other systems reviewed and are negative.   Physical Exam Updated Vital Signs BP (!) 154/107 (BP Location: Left Arm)   Pulse 93   Temp 97.8 F (36.6 C) (Oral)   Resp (!) 29   Ht 1.676 m (5\' 6" )   Wt 63.5 kg   SpO2 99%   BMI 22.60 kg/m  Physical Exam Vitals and nursing note reviewed.  Constitutional:      General: She is not in acute distress.    Appearance: Normal appearance. She is well-developed. She is not toxic-appearing.  HENT:     Head: Normocephalic and atraumatic.  Eyes:     General: Lids are normal.     Conjunctiva/sclera: Conjunctivae normal.     Pupils: Pupils are equal, round,  and reactive to light.  Neck:     Thyroid: No thyroid mass.     Trachea: No tracheal deviation.  Cardiovascular:     Rate and Rhythm: Normal rate and regular rhythm.     Heart sounds: Normal heart sounds. No murmur heard.    No gallop.  Pulmonary:     Effort: Pulmonary effort is normal. No respiratory distress.     Breath sounds: Normal breath sounds. No stridor. No decreased breath sounds, wheezing, rhonchi or rales.  Abdominal:     General: There is no distension.     Palpations: Abdomen is soft.     Tenderness: There is no abdominal tenderness. There is no rebound.  Musculoskeletal:        General: No tenderness. Normal range of motion.     Cervical back: Normal range of motion and neck supple.  Skin:     General: Skin is warm and dry.     Findings: No abrasion or rash.  Neurological:     Mental Status: She is alert and oriented to person, place, and time. Mental status is at baseline.     GCS: GCS eye subscore is 4. GCS verbal subscore is 5. GCS motor subscore is 6.     Cranial Nerves: Cranial nerves are intact. No cranial nerve deficit.     Sensory: No sensory deficit.     Motor: Motor function is intact.  Psychiatric:        Attention and Perception: Attention normal.        Speech: Speech normal.        Behavior: Behavior normal.     ED Results / Procedures / Treatments   Labs (all labs ordered are listed, but only abnormal results are displayed) Labs Reviewed  HEMOGLOBIN A1C  CBC WITH DIFFERENTIAL/PLATELET  PROTIME-INR  APTT  COMPREHENSIVE METABOLIC PANEL  LIPID PANEL  I-STAT CG4 LACTIC ACID, ED  TROPONIN I (HIGH SENSITIVITY)    EKG EKG Interpretation Date/Time:  Saturday September 13 2022 07:18:14 EDT Ventricular Rate:  93 PR Interval:  127 QRS Duration:  111 QT Interval:  370 QTC Calculation: 461 R Axis:   -50  Text Interpretation: Sinus rhythm Inferior infarct, old Anterolateral infarct, acute (LAD) >>> Acute MI <<< Confirmed by Lorre Nick ([id number]) on 09/13/2022 7:31:34 AM  Radiology No results found.  Procedures Procedures    Medications Ordered in ED Medications  0.9 %  sodium chloride infusion (has no administration in time range)  aspirin chewable tablet 324 mg (has no administration in time range)  heparin injection 3,800 Units (has no administration in time range)    ED Course/ Medical Decision Making/ A&P                                 Medical Decision Making Amount and/or Complexity of Data Reviewed Labs: ordered. Radiology: ordered.  Risk OTC drugs. Prescription drug management.   Patient is EKG per my interpretation shows acute anterior wall MI with reciprocal changes.  She is in sinus rhythm.  Patient started on heparin and given  aspirin.  Pain is currently a 2/10  7:52 AM Discussed case with Dr. Gery Pray from cardiology.  He was reviewed patient's current EKG as well as old study and would like to hold off taking the patient to the heart catheterization lab at this point.  He recommends continued heparin therapy.  Patient's chest x-ray upon review interpretation concerning for possible  CHF.  Patient's mediastinum looks widened to me.  Patient to have CT angio of chest to rule out dissection.  8:53 AM Patient CT angio chest abdomen pelvis negative for dissection.  Patient started on IV heparin.  Case discussed with cardiologist again who agrees for transfer and admission to [facility].  Family notified.  CRITICAL CARE Performed by: Toy Baker Total critical care time: 75 minutes Critical care time was exclusive of separately billable procedures and treating other patients. Critical care was necessary to treat or prevent imminent or life-threatening deterioration. Critical care was time spent personally by me on the following activities: development of treatment plan with patient and/or surrogate as well as nursing, discussions with consultants, evaluation of patient's response to treatment, examination of patient, obtaining history from patient or surrogate, ordering and performing treatments and interventions, ordering and review of laboratory studies, ordering and review of radiographic studies, pulse oximetry and re-evaluation of patient's condition.        Final Clinical Impression(s) / ED Diagnoses Final diagnoses:  None    Rx / DC Orders ED Discharge Orders     None         Lorre Nick, MD 09/13/22 [phone]

## 2022-09-13 NOTE — Progress Notes (Signed)
RN called to room by patient saying she couldn't breath. On arrival to room patient sitting on edge of bed, very dyspnea, O2 on 3 liters. Patient placed on nonrebreather, MD made ware of the situation, rapid called to bedside. Schedule IV lasix was also given while awaiting orders. See new orders from MD. Patient asked to come of the nonrebreather, because she could not breath with it on and was placed on 10 liters [place]. Rapid at bedside, patient HR went up to 140's as well. IV steroid given, patient begin more relax, breathing and HR improve as well.

## 2022-09-13 NOTE — ED Notes (Signed)
EKG completed and given to MD Theresia Lo.

## 2022-09-13 NOTE — Significant Event (Signed)
Rapid Response Event Note   Reason for Call :  SOB  Initial Focused Assessment:  On arrival pt A&O with c/o SOB stating she is unable to breathe out of her mouth. She appears anxious with HR and RR elevated despite spO2 99%. Lungs Clear/Diminished throughout.   Vitals: HR 141, BP 150/116, RR 28, spO2 99% on 5L [place]   Interventions:  CXR- PTA Abg Scheduled Solumedrol given  Valium EKG Diltiazem  Plan of Care:  Continue to monitor pt respiratory status. RN to call with any changes or concerns.    Event Summary:   MD Notified: Dr Maryjean Ka- PTA Call Time: 1758 Arrival Time: 1801 End Time: 1840  Mordecai Rasmussen, RN

## 2022-09-13 NOTE — Assessment & Plan Note (Signed)
Cell to quit, nicotine replacement therapy, nurse to provide smoking education

## 2022-09-13 NOTE — Assessment & Plan Note (Signed)
Monitoring LFTs

## 2022-09-13 NOTE — Progress Notes (Signed)
ANTICOAGULATION CONSULT NOTE - Initial Consult  Pharmacy Consult for Heparin Indication: chest pain/ACS  Allergies  Allergen Reactions   Famotidine     "Made me tired and weak"    Patient Measurements: Height: 5\' 6"  (167.6 cm) Weight: 66.1 kg (145 lb 11.6 oz) IBW/kg (Calculated) : 59.3 Heparin Dosing Weight: 63.5 kg  Vital Signs: Temp: 97.6 F (36.4 C) (08/03 1927) Temp Source: Oral (08/03 1927) BP: 122/90 (08/03 2130) Pulse Rate: 102 (08/03 2130)  Labs: Recent Labs    09/11/22 0500 09/12/22 0446 09/13/22 0727 09/13/22 0743 09/13/22 0927 09/13/22 2120  HGB 12.5 11.5* 12.3 10.9*  --   --   HCT 38.5 35.1* 37.2 32.0*  --   --   PLT 269 235 307  --   --   --   APTT  --   --  31  --   --  57*  LABPROT  --   --  17.5*  --   --   --   INR  --   --  1.4*  --   --   --   HEPARINUNFRC  --   --   --   --   --  >1.10*  CREATININE 0.62 0.64 0.69 0.50  --   --   TROPONINIHS  --   --  1,521*  --  994*  --     Estimated Creatinine Clearance: 59.5 mL/min (by C-G formula based on SCr of 0.5 mg/dL).   Medical History: Past Medical History:  Diagnosis Date   Abnormal nuclear stress test February '13   Small, partially reversible inferolateral ischemia. Marked hypertensive response to exercise. Low risk.   Arthritis    Chest pain with minimal risk for cardiac etiology February 2013   Evaluated with echocardiogram and Myoview, do   COPD (chronic obstructive pulmonary disease) (HCC)    Depression    Hepatitis C    Hypertension    Osteopenia of spine 05/22/2016   PAD (peripheral artery disease) (HCC) 10/01/2021   ABI: monophasic and decreased ABIs. Refer to vascular   Right tibial fracture      Medications:  Medications Prior to Admission  Medication Sig Dispense Refill Last Dose   albuterol (VENTOLIN HFA) 108 (90 Base) MCG/ACT inhaler INHALE 1 TO 2 PUFFS INTO THE LUNGS EVERY 6 HOURS AS NEEDED FOR WHEEZING OR SHORTNESS OF BREATH (Patient taking differently: Inhale 2 puffs  into the lungs every 6 (six) hours as needed for wheezing or shortness of breath.) 6.7 g 2 09/13/2022   amLODipine (NORVASC) 10 MG tablet Take 1 tablet (10 mg total) by mouth daily. 90 tablet 3 Past Week   apixaban (ELIQUIS) 5 MG TABS tablet Take 1 tablet (5mg ) by mouth twice daily (Patient taking differently: Take 5 mg by mouth in the morning and at bedtime.) 90 tablet 1 09/12/2022 at 1700   aspirin EC 81 MG tablet Take 1 tablet (81 mg total) by mouth daily. Swallow whole. 150 tablet 2 Past Week   atorvastatin (LIPITOR) 20 MG tablet Take 1 tablet (20 mg total) by mouth at bedtime. 30 tablet 1 09/12/2022   cilostazol (PLETAL) 100 MG tablet Take 100 mg by mouth 2 (two) times daily.   09/12/2022   hydrocortisone 1 % ointment Apply 1 Application topically 2 (two) times daily. (Patient taking differently: Apply 1 Application topically See admin instructions. Apply to affected areas 2 times a day) 30 g 0 09/12/2022   Iron-Vitamins (GERITOL COMPLETE) TABS Take 1 tablet by mouth  daily with breakfast.   Past Week   metoprolol tartrate (LOPRESSOR) 25 MG tablet Take 1 tablet (25 mg total) by mouth 2 (two) times daily. 60 tablet 1 09/12/2022 at 1600   SPIRIVA RESPIMAT 2.5 MCG/ACT AERS INHALE 2 PUFFS INTO THE LUNGS DAILY (Patient not taking: Reported on 09/13/2022) 4 g 11 Not Taking   Scheduled:   atorvastatin  20 mg Oral QHS   cilostazol  100 mg Oral BID   diazepam  5 mg Intravenous Once   insulin aspart  0-15 Units Subcutaneous TID WC   insulin aspart  0-5 Units Subcutaneous QHS   metoprolol tartrate  25 mg Oral BID   pantoprazole  20 mg Oral Daily   sodium chloride flush  3 mL Intravenous Q12H   umeclidinium bromide  1 puff Inhalation Daily   Infusions:   azithromycin 500 mg (09/13/22 2237)   cefTRIAXone (ROCEPHIN)  IV Stopped (09/13/22 2204)   heparin 1,000 Units/hr (09/13/22 2232)   PRN: acetaminophen **OR** acetaminophen, albuterol, nicotine polacrilex, polyethylene glycol  Assessment: 72 yof with a  history of hepatocellular carcinoma currently on chemotherapy, hypertension, PAD, COPD, hep C, depression, received last chemotherapy on 08/15/2022. Patient presented to [facility] ED with SOB since last night. Patient admitted 7/29-8/2 at [facility] for NSTEMI.  Heparin per pharmacy consult placed for chest pain/ACS. Notably, heparin bolus given in [facility] ED prior to pharmacy consult review on transfer to [facility] ED.  Patient is on apixaban prior to arrival. Given discharge yesterday and return to ED, unsure of last apixaban dose at home. However, it was given 8/2 AM while at [facility]. Will require aPTT monitoring due to likely falsely high anti-Xa level secondary to DOAC use.  Heparin level > 1.10 is not correlating with aPTT of 57 (subtherapeutic). Hgb (10.9) and PLTs (307) remain stable.  Goal of Therapy:  Heparin level 0.3-0.7 units/ml aPTT 66-102 seconds Monitor platelets by anticoagulation protocol: Yes   Plan:  Increase heparin infusion to 1100 units/hr given Check aPTT & anti-Xa level in 8 hours and daily while on heparin Continue to monitor via aPTT until levels are correlated Continue to monitor H&H and platelets  Romie Minus, PharmD PGY1 Pharmacy Resident  Please check AMION for all [facility] Pharmacy phone numbers After 10:00 PM, call Main Pharmacy [phone]

## 2022-09-13 NOTE — Assessment & Plan Note (Signed)
This is known since at least July 19, 2022.  Continue with heparin infusion at this time.  Cardiology not raising any objection to same

## 2022-09-13 NOTE — ED Notes (Signed)
Code Stemi

## 2022-09-14 ENCOUNTER — Other Ambulatory Visit: Payer: Self-pay

## 2022-09-14 DIAGNOSIS — I408 Other acute myocarditis: Secondary | ICD-10-CM | POA: Diagnosis not present

## 2022-09-14 DIAGNOSIS — C220 Liver cell carcinoma: Secondary | ICD-10-CM | POA: Diagnosis not present

## 2022-09-14 LAB — PROTIME-INR
INR: 1.3 — ABNORMAL HIGH (ref 0.8–1.2)
Prothrombin Time: 16.6 s — ABNORMAL HIGH (ref 11.4–15.2)

## 2022-09-14 LAB — CK TOTAL AND CKMB (NOT AT ARMC)
CK, MB: 280.6 ng/mL — ABNORMAL HIGH (ref 0.5–5.0)
Total CK: 2159 U/L — ABNORMAL HIGH (ref 38–234)

## 2022-09-14 LAB — BASIC METABOLIC PANEL WITH GFR
Anion gap: 18 — ABNORMAL HIGH (ref 5–15)
BUN: 18 mg/dL (ref 8–23)
CO2: 17 mmol/L — ABNORMAL LOW (ref 22–32)
Calcium: 8.6 mg/dL — ABNORMAL LOW (ref 8.9–10.3)
Chloride: 102 mmol/L (ref 98–111)
Creatinine, Ser: 0.9 mg/dL (ref 0.44–1.00)
GFR, Estimated: >60 mL/min (ref >60)
Glucose, Bld: 150 mg/dL — ABNORMAL HIGH (ref 70–99)
Potassium: 4.1 mmol/L (ref 3.5–5.1)
Sodium: 137 mmol/L (ref 135–145)

## 2022-09-14 LAB — C-REACTIVE PROTEIN: CRP: 0.7 mg/dL (ref <1.0)

## 2022-09-14 LAB — HEPATIC FUNCTION PANEL
ALT: 236 U/L — ABNORMAL HIGH (ref 0–44)
AST: 235 U/L — ABNORMAL HIGH (ref 15–41)
Albumin: 2.9 g/dL — ABNORMAL LOW (ref 3.5–5.0)
Alkaline Phosphatase: 174 U/L — ABNORMAL HIGH (ref 38–126)
Bilirubin, Direct: 0.3 mg/dL — ABNORMAL HIGH (ref 0.0–0.2)
Indirect Bilirubin: 0.7 mg/dL (ref 0.3–0.9)
Total Bilirubin: 1 mg/dL (ref 0.3–1.2)
Total Protein: 6.8 g/dL (ref 6.5–8.1)

## 2022-09-14 LAB — CBC
HCT: 35.3 % — ABNORMAL LOW (ref 36.0–46.0)
Hemoglobin: 11.7 g/dL — ABNORMAL LOW (ref 12.0–15.0)
MCH: 29.1 pg (ref 26.0–34.0)
MCHC: 33.1 g/dL (ref 30.0–36.0)
MCV: 87.8 fL (ref 80.0–100.0)
Platelets: 324 10*3/uL (ref 150–400)
RBC: 4.02 MIL/uL (ref 3.87–5.11)
RDW: 15.7 % — ABNORMAL HIGH (ref 11.5–15.5)
WBC: 8.4 10*3/uL (ref 4.0–10.5)
nRBC: 0 % (ref 0.0–0.2)

## 2022-09-14 LAB — GLUCOSE, CAPILLARY
Glucose-Capillary: 149 mg/dL — ABNORMAL HIGH (ref 70–99)
Glucose-Capillary: 177 mg/dL — ABNORMAL HIGH (ref 70–99)
Glucose-Capillary: 109 mg/dL — ABNORMAL HIGH (ref 70–99)
Glucose-Capillary: 131 mg/dL — ABNORMAL HIGH (ref 70–99)

## 2022-09-14 LAB — APTT: aPTT: 117 s — ABNORMAL HIGH (ref 24–36)

## 2022-09-14 LAB — HCV RNA QUANT: HCV Quantitative: NOT DETECTED [IU]/mL (ref >50)

## 2022-09-14 LAB — LACTIC ACID, PLASMA: Lactic Acid, Venous: 1.3 mmol/L (ref 0.5–1.9)

## 2022-09-14 LAB — SEDIMENTATION RATE: Sed Rate: 48 mm/hr — ABNORMAL HIGH (ref 0–22)

## 2022-09-14 LAB — TROPONIN I (HIGH SENSITIVITY): Troponin I (High Sensitivity): 752 ng/L (ref <18)

## 2022-09-14 LAB — HEPARIN LEVEL (UNFRACTIONATED): Heparin Unfractionated: >1.1 [IU]/mL — ABNORMAL HIGH (ref 0.30–0.70)

## 2022-09-14 LAB — PROCALCITONIN: Procalcitonin: 0.87 ng/mL

## 2022-09-14 MED ORDER — METHYLPREDNISOLONE SODIUM SUCC 125 MG IJ SOLR
125.0000 mg | Freq: Two times a day (BID) | INTRAMUSCULAR | Status: DC
  Administered 2022-09-14 – 2022-09-16 (×6): 125 mg via INTRAVENOUS
  Filled 2022-09-14 (×6): qty 2

## 2022-09-14 MED ORDER — IBUPROFEN 200 MG PO TABS
600.0000 mg | ORAL_TABLET | Freq: Four times a day (QID) | ORAL | Status: DC | PRN
  Administered 2022-09-15: 600 mg via ORAL
  Filled 2022-09-14: qty 3

## 2022-09-14 MED ORDER — LORAZEPAM 1 MG PO TABS
1.0000 mg | ORAL_TABLET | Freq: Once | ORAL | Status: AC
  Administered 2022-09-14: 1 mg via ORAL
  Filled 2022-09-14: qty 1

## 2022-09-14 MED ORDER — APIXABAN 5 MG PO TABS
5.0000 mg | ORAL_TABLET | Freq: Two times a day (BID) | ORAL | Status: DC
  Administered 2022-09-14 – 2022-09-20 (×13): 5 mg via ORAL
  Filled 2022-09-14 (×13): qty 1

## 2022-09-14 NOTE — Progress Notes (Addendum)
ANTICOAGULATION CONSULT NOTE   Pharmacy Consult for apixaban Indication: hx of recent portal vein thrombosis (07/19/22)  Allergies  Allergen Reactions   Famotidine     "Made me tired and weak"    Patient Measurements: Height: 5\' 6"  (167.6 cm) Weight: 66.1 kg (145 lb 11.6 oz) IBW/kg (Calculated) : 59.3 Heparin Dosing Weight: 63.5 kg  Vital Signs: Temp: 97.8 F (36.6 C) (08/04 0746) Temp Source: Oral (08/04 0746) BP: 123/90 (08/04 0746) Pulse Rate: 91 (08/04 0746)  Labs: Recent Labs    09/12/22 0446 09/13/22 0727 09/13/22 0743 09/13/22 0927 09/13/22 2120 09/14/22 0024 09/14/22 0545 09/14/22 0757  HGB 11.5* 12.3 10.9*  --   --  11.7*  --   --   HCT 35.1* 37.2 32.0*  --   --  35.3*  --   --   PLT 235 307  --   --   --  324  --   --   APTT  --  31  --   --  57*  --   --  117*  LABPROT  --  17.5*  --   --   --  16.6*  --   --   INR  --  1.4*  --   --   --  1.3*  --   --   HEPARINUNFRC  --   --   --   --  >1.10*  --   --  >1.10*  CREATININE 0.64 0.69 0.50  --   --  0.90  --   --   CKTOTAL  --   --   --   --  2,683*  --   --  2,159*  CKMB  --   --   --   --   --   --   --  280.6*  TROPONINIHS  --  1,521*  --  994*  --   --  752*  --     Estimated Creatinine Clearance: 52.9 mL/min (by C-G formula based on SCr of 0.9 mg/dL).   Medical History: Past Medical History:  Diagnosis Date   Abnormal nuclear stress test February '13   Small, partially reversible inferolateral ischemia. Marked hypertensive response to exercise. Low risk.   Arthritis    Chest pain with minimal risk for cardiac etiology February 2013   Evaluated with echocardiogram and Myoview, do   COPD (chronic obstructive pulmonary disease) (HCC)    Depression    Hepatitis C    Hypertension    Osteopenia of spine 05/22/2016   PAD (peripheral artery disease) (HCC) 10/01/2021   ABI: monophasic and decreased ABIs. Refer to vascular   Right tibial fracture      Medications:  Medications Prior to Admission   Medication Sig Dispense Refill Last Dose   albuterol (VENTOLIN HFA) 108 (90 Base) MCG/ACT inhaler INHALE 1 TO 2 PUFFS INTO THE LUNGS EVERY 6 HOURS AS NEEDED FOR WHEEZING OR SHORTNESS OF BREATH (Patient taking differently: Inhale 2 puffs into the lungs every 6 (six) hours as needed for wheezing or shortness of breath.) 6.7 g 2 09/13/2022   amLODipine (NORVASC) 10 MG tablet Take 1 tablet (10 mg total) by mouth daily. 90 tablet 3 Past Week   apixaban (ELIQUIS) 5 MG TABS tablet Take 1 tablet (5mg ) by mouth twice daily (Patient taking differently: Take 5 mg by mouth in the morning and at bedtime.) 90 tablet 1 09/12/2022 at 1700   aspirin EC 81 MG tablet Take 1 tablet (81 mg total)  by mouth daily. Swallow whole. 150 tablet 2 Past Week   atorvastatin (LIPITOR) 20 MG tablet Take 1 tablet (20 mg total) by mouth at bedtime. 30 tablet 1 09/12/2022   cilostazol (PLETAL) 100 MG tablet Take 100 mg by mouth 2 (two) times daily.   09/12/2022   hydrocortisone 1 % ointment Apply 1 Application topically 2 (two) times daily. (Patient taking differently: Apply 1 Application topically See admin instructions. Apply to affected areas 2 times a day) 30 g 0 09/12/2022   Iron-Vitamins (GERITOL COMPLETE) TABS Take 1 tablet by mouth daily with breakfast.   Past Week   metoprolol tartrate (LOPRESSOR) 25 MG tablet Take 1 tablet (25 mg total) by mouth 2 (two) times daily. 60 tablet 1 09/12/2022 at 1600   SPIRIVA RESPIMAT 2.5 MCG/ACT AERS INHALE 2 PUFFS INTO THE LUNGS DAILY (Patient not taking: Reported on 09/13/2022) 4 g 11 Not Taking   Scheduled:   apixaban  5 mg Oral BID   cilostazol  100 mg Oral BID   insulin aspart  0-15 Units Subcutaneous TID WC   insulin aspart  0-5 Units Subcutaneous QHS   methylPREDNISolone (SOLU-MEDROL) injection  125 mg Intravenous Q12H   metoprolol tartrate  25 mg Oral BID   pantoprazole  20 mg Oral Daily   sodium chloride flush  3 mL Intravenous Q12H   umeclidinium bromide  1 puff Inhalation Daily    Infusions:   azithromycin Stopped (09/13/22 2338)   cefTRIAXone (ROCEPHIN)  IV Stopped (09/13/22 2204)   PRN: albuterol, ibuprofen, nicotine polacrilex, polyethylene glycol  Assessment: 72 year old female with a history of hepatocellular carcinoma currently on chemotherapy, hypertension, PAD, COPD, hep C, depression, received last chemotherapy on 08/15/2022. Patient presented to [facility] ED with SOB since last night. Patient admitted 7/29-8/2 at [facility] for NSTEMI.  Patient was on apixaban 5 mg BID PTA due to a recent portal vein thrombosis (07/2022). Was held during this admission due to concern for ACS and was started on heparin infusion. Now cleared to stop heparin per MD and restart PTA apixaban.   Pharmacy consulted to transition from heparin to apixaban. Hgb 11.7 (inc from 10.9) , Plt 324 wnl  Goal of Therapy:  Monitor platelets by anticoagulation protocol: Yes   Plan:  Stop heparin infusion Restart PTA apixaban 5 mg BID First dose now, then resume normal BID schedule Monitor CBC and for signs/symptoms of bleeding   Stephenie Acres, PharmD PGY1 Pharmacy Resident 09/14/2022 11:30 AM   Please check AMION for all [facility] Pharmacy phone numbers After 10:00 PM, call Main Pharmacy [phone]

## 2022-09-14 NOTE — Plan of Care (Signed)
  Problem: Education: Goal: Understanding of cardiac disease, CV risk reduction, and recovery process will improve Outcome: Progressing Goal: Individualized Educational Video(s) Outcome: Progressing   Problem: Activity: Goal: Ability to tolerate increased activity will improve Outcome: Progressing   Problem: Cardiac: Goal: Ability to achieve and maintain adequate cardiovascular perfusion will improve Outcome: Progressing   Problem: Health Behavior/Discharge Planning: Goal: Ability to safely manage health-related needs after discharge will improve Outcome: Progressing   Problem: Education: Goal: Understanding of CV disease, CV risk reduction, and recovery process will improve Outcome: Progressing Goal: Individualized Educational Video(s) Outcome: Progressing   Problem: Activity: Goal: Ability to return to baseline activity level will improve Outcome: Progressing   Problem: Cardiovascular: Goal: Ability to achieve and maintain adequate cardiovascular perfusion will improve Outcome: Progressing Goal: Vascular access site(s) Level 0-1 will be maintained Outcome: Progressing   Problem: Health Behavior/Discharge Planning: Goal: Ability to safely manage health-related needs after discharge will improve Outcome: Progressing   Problem: Education: Goal: Ability to describe self-care measures that may prevent or decrease complications (Diabetes Survival Skills Education) will improve Outcome: Progressing Goal: Individualized Educational Video(s) Outcome: Progressing   Problem: Coping: Goal: Ability to adjust to condition or change in health will improve Outcome: Progressing   Problem: Fluid Volume: Goal: Ability to maintain a balanced intake and output will improve Outcome: Progressing   Problem: Health Behavior/Discharge Planning: Goal: Ability to identify and utilize available resources and services will improve Outcome: Progressing Goal: Ability to manage health-related  needs will improve Outcome: Progressing   Problem: Metabolic: Goal: Ability to maintain appropriate glucose levels will improve Outcome: Progressing   Problem: Nutritional: Goal: Maintenance of adequate nutrition will improve Outcome: Progressing Goal: Progress toward achieving an optimal weight will improve Outcome: Progressing   Problem: Skin Integrity: Goal: Risk for impaired skin integrity will decrease Outcome: Progressing   Problem: Tissue Perfusion: Goal: Adequacy of tissue perfusion will improve Outcome: Progressing   Problem: Education: Goal: Knowledge of General Education information will improve Description: Including pain rating scale, medication(s)/side effects and non-pharmacologic comfort measures Outcome: Progressing   Problem: Health Behavior/Discharge Planning: Goal: Ability to manage health-related needs will improve Outcome: Progressing   Problem: Clinical Measurements: Goal: Ability to maintain clinical measurements within normal limits will improve Outcome: Progressing Goal: Will remain free from infection Outcome: Progressing Goal: Diagnostic test results will improve Outcome: Progressing Goal: Respiratory complications will improve Outcome: Progressing Goal: Cardiovascular complication will be avoided Outcome: Progressing   Problem: Activity: Goal: Risk for activity intolerance will decrease Outcome: Progressing   Problem: Nutrition: Goal: Adequate nutrition will be maintained Outcome: Progressing   Problem: Coping: Goal: Level of anxiety will decrease Outcome: Progressing   Problem: Elimination: Goal: Will not experience complications related to bowel motility Outcome: Progressing Goal: Will not experience complications related to urinary retention Outcome: Progressing   Problem: Pain Managment: Goal: General experience of comfort will improve Outcome: Progressing   Problem: Safety: Goal: Ability to remain free from injury will  improve Outcome: Progressing   Problem: Skin Integrity: Goal: Risk for impaired skin integrity will decrease Outcome: Progressing

## 2022-09-14 NOTE — Discharge Instructions (Addendum)
Information on my medicine - ELIQUIS (apixaban)  This medication education was reviewed with me or my healthcare representative as part of my discharge preparation.  You were taking this medication prior to this hospital admission.    Why was Eliquis prescribed for you?- Started  prior to this hospital admission, in June 2024 for portal vein thrombosis (blood clot).  Eliquis was prescribed to treat blood clots that may have been found in the veins of your legs (deep vein thrombosis) or in your lungs (pulmonary embolism) and to reduce the risk of them occurring again.  What do You need to know about Eliquis ? Continue ONE 5 mg tablet taken TWICE daily.  Eliquis may be taken with or without food.   Try to take the dose about the same time in the morning and in the evening. If you have difficulty swallowing the tablet whole please discuss with your pharmacist how to take the medication safely.  Take Eliquis exactly as prescribed and DO NOT stop taking Eliquis without talking to the doctor who prescribed the medication.  Stopping may increase your risk of developing a new blood clot.  Refill your prescription before you run out.  After discharge, you should have regular check-up appointments with your healthcare provider that is prescribing your Eliquis.    What do you do if you miss a dose? If a dose of ELIQUIS is not taken at the scheduled time, take it as soon as possible on the same day and twice-daily administration should be resumed. The dose should not be doubled to make up for a missed dose.  Important Safety Information A possible side effect of Eliquis is bleeding. You should call your healthcare provider right away if you experience any of the following: Bleeding from an injury or your nose that does not stop. Unusual colored urine (red or dark brown) or unusual colored stools (red or black). Unusual bruising for unknown reasons. A serious fall or if you hit your head (even  if there is no bleeding).  Some medicines may interact with Eliquis and might increase your risk of bleeding or clotting while on Eliquis. To help avoid this, consult your healthcare provider or pharmacist prior to using any new prescription or non-prescription medications, including herbals, vitamins, non-steroidal anti-inflammatory drugs (NSAIDs) and supplements.  This website has more information on Eliquis (apixaban): http://www.eliquis.com/eliquis/home

## 2022-09-14 NOTE — Progress Notes (Signed)
PROGRESS NOTE    Patient: Christine Hurley                            PCP: Willow Ora, MD                    DOB: 1950-03-02            DOA: 09/13/2022 ZOX:[id number]             DOS: 09/14/2022, 1:50 PM   LOS: 1 day   Date of Service: The patient was seen and examined on 09/14/2022  Subjective:   The patient was seen and examined this morning. Hemodynamically stable. No issues overnight .  Brief Narrative:   Christine Hurley is a 72 y.o. female with medical history significant of hepatocellular carcinoma-liver mass (s/p 1 treatment with tremelimumab and durvalumab on 08/15/2022.  Was discharge from [facility] hospital yesterday after she was treated there for suspected NSTEMI.  Patient also had a coronary angiogram done and there was no finding of severe CAD no interventions were done.   She returned from home after one day with complain of chest pain pl- started feeling some exertional dyspnea.  Son is having to reposition the patient frequently at different degrees of incline/rest of upper back to keep the patient comfortable.  There has been no fever no cough no vomiting or diarrhea no loss of consciousness no palpitations.  Patient shortness of breath has progressed such that she is short of breath even at rest now.  Patient returned to [facility] at [facility] for  Workup:  showed a worsening troponin, CK  as well as new EKG changes.  Patient was subsequently transferred to [facility], ER and has been discussed with cardiology.    Cardiology and patient's primary oncologist has seen the patient Is determined the patient may have pericarditis, myocarditis  Started on IV steroids, O2 supplements Her chest discomfort is mostly gone.     Assessment & Plan:   Principal Problem:   Myocarditis (HCC) Active Problems:   Chronic hepatitis C without hepatic coma (HCC)   Cigarette nicotine dependence with nicotine-induced disorder   Portal vein thrombosis   Hepatocellular carcinoma (HCC)    Fluid overload   Assessment and Plan: * Myocarditis (HCC)/myopericarditis -Improved shortness of breath, resolved chest pain -Status post non-STEMI last week,S/P cardiac cath 09/10/2022 Was D/Ced September 12, 2022 No stenting needed -Elevated troponin, trending down -Elevated CK, trending down - myocarditis, likely related to chemotherapeutic agents given, see below. -Per oncology received Solu-Medrol 1000 mg on 09/13/2022, continue with 25 mg IV twice daily -Cardiology planning for see MRI   Fluid overload/?PNA  Afebrile, with no leukocytosis  POA: Mild pleural effusion on CAT scan as well as a small amount of simple fluid attenuation ascites throughout abdomen and pelvis.  Patient is symptomatic in terms of sensation of shortness of breath.   -1 dose of Lasix IV was given on admission ?  PNA, started on broad-spectrum antibiotics, follow-up with inflammatory markers, cultures>> escalating antibiotics   Hepatocellular carcinoma IIA  Durvalumab and tremelimumab have been reported to cause myocarditis and pericarditis . I -Oncology - Dr.  Truett Perna consulted, following closely 1 gm methylprednisolone given follow-up 125 mg IV twice daily  -patient seems to have HCV mentioned in the chart which will need monitoring.    Portal vein thrombosis This is known since at least July 19, 2022.  Was started on  heparin infusion at this time.  Cardiology, transitioning to Eliquis   Cigarette nicotine dependence with nicotine-induced disorder Cell to quit, nicotine replacement therapy, nurse to provide smoking education   Chronic hepatitis C without hepatic coma [facility]) Monitoring LFTs    Latest Ref Rng & Units 09/14/2022   12:24 AM 09/13/2022    7:27 AM 09/12/2022    4:46 AM  Hepatic Function  Total Protein 6.5 - 8.1 g/dL 6.8  7.5  6.5   Albumin 3.5 - 5.0 g/dL 2.9  3.3  3.0   AST 15 - 41 U/L 235  288  252   ALT 0 - 44 U/L 236  263  245   Alk Phosphatase 38 - 126 U/L 174  198  185   Total Bilirubin  0.3 - 1.2 mg/dL 1.0  0.8  1.0   Bilirubin, Direct 0.0 - 0.2 mg/dL 0.3                 Nutritional status:  The patient's BMI is: Body mass index is 23.52 kg/m. I agree with the assessment and plan as outlined ------------------------------------------------------------------------------------------------------------------------------------------  DVT prophylaxis:  SCDs Start: 09/13/22 1142 apixaban (ELIQUIS) tablet 5 mg   Code Status:   Code Status: Full Code  Family Communication: Family member was present on the phone, The above findings and plan of care has been discussed with patient (and family)  in detail,  they expressed understanding and agreement of above. -Advance care planning has been discussed.   Admission status:   Status is: Inpatient Remains inpatient appropriate because: Needing close monitoring by cardiology) IV stable   Disposition: From  - home             Planning for discharge in 1-2 days: to Home   Procedures:   No admission procedures for hospital encounter.   Antimicrobials:  Anti-infectives (From admission, onward)    Start     Dose/Rate Route Frequency Ordered Stop   09/13/22 2130  cefTRIAXone (ROCEPHIN) 1 g in sodium chloride 0.9 % 100 mL IVPB        1 g 200 mL/hr over 30 Minutes Intravenous Every 24 hours 09/13/22 2044     09/13/22 2130  azithromycin (ZITHROMAX) 500 mg in sodium chloride 0.9 % 250 mL IVPB        500 mg 250 mL/hr over 60 Minutes Intravenous Every 24 hours 09/13/22 2044 09/16/22 2129        Medication:   apixaban  5 mg Oral BID   cilostazol  100 mg Oral BID   insulin aspart  0-15 Units Subcutaneous TID WC   insulin aspart  0-5 Units Subcutaneous QHS   methylPREDNISolone (SOLU-MEDROL) injection  125 mg Intravenous Q12H   metoprolol tartrate  25 mg Oral BID   pantoprazole  20 mg Oral Daily   sodium chloride flush  3 mL Intravenous Q12H   umeclidinium bromide  1 puff Inhalation Daily    albuterol, ibuprofen,  nicotine polacrilex, polyethylene glycol   Objective:   Vitals:   09/14/22 0400 09/14/22 0600 09/14/22 0746 09/14/22 1227  BP: (!) 131/91 133/80 (!) 123/90 (!) 130/115  Pulse: 86 92 91 88  Resp: 16  18 16   Temp: 97.8 F (36.6 C)  97.8 F (36.6 C) 97.9 F (36.6 C)  TempSrc: Oral  Oral Oral  SpO2: 98% 98% 97%   Weight:      Height:        Intake/Output Summary (Last 24 hours) at 09/14/2022 1350 Last data  filed at 09/14/2022 1300 Gross per 24 hour  Intake 963.51 ml  Output 2100 ml  Net -1136.49 ml   Filed Weights   09/13/22 0719 09/13/22 1651  Weight: 63.5 kg 66.1 kg     Physical examination:   Constitution:  Alert, cooperative, no distress, SOB but   Appears calm and comfortable  Psychiatric:   Normal and stable mood and affect, cognition intact,   HEENT:        Normocephalic, PERRL, otherwise with in Normal limits  Chest:         Chest symmetric Cardio vascular:  S1/S2, RRR, No murmure, No Rubs or Gallops  pulmonary: Clear to auscultation bilaterally, respirations unlabored, negative wheezes / crackles Abdomen: Soft, non-tender, non-distended, bowel sounds,no masses, no organomegaly Muscular skeletal: Limited exam - in bed, able to move all 4 extremities,   Neuro: CNII-XII intact. , normal motor and sensation, reflexes intact  Extremities: No pitting edema lower extremities, +2 pulses  Skin: Dry, warm to touch, negative for any Rashes, No open wounds Wounds: per nursing documentation   ------------------------------------------------------------------------------------------------------------------------------------------    LABs:     Latest Ref Rng & Units 09/14/2022   12:24 AM 09/13/2022    7:43 AM 09/13/2022    7:27 AM  CBC  WBC 4.0 - 10.5 K/uL 8.4   8.0   Hemoglobin 12.0 - 15.0 g/dL 95.6  21.3  08.6   Hematocrit 36.0 - 46.0 % 35.3  32.0  37.2   Platelets 150 - 400 K/uL 324   307       Latest Ref Rng & Units 09/14/2022   12:24 AM 09/13/2022    7:43 AM 09/13/2022     7:27 AM  CMP  Glucose 70 - 99 mg/dL 578  93  469   BUN 8 - 23 mg/dL 18  19  20    Creatinine 0.44 - 1.00 mg/dL 6.29  5.28  4.13   Sodium 135 - 145 mmol/L 137  135  132   Potassium 3.5 - 5.1 mmol/L 4.1  4.1  4.1   Chloride 98 - 111 mmol/L 102  106  101   CO2 22 - 32 mmol/L 17   22   Calcium 8.9 - 10.3 mg/dL 8.6   8.9   Total Protein 6.5 - 8.1 g/dL 6.8   7.5   Total Bilirubin 0.3 - 1.2 mg/dL 1.0   0.8   Alkaline Phos 38 - 126 U/L 174   198   AST 15 - 41 U/L 235   288   ALT 0 - 44 U/L 236   263        Micro Results Recent Results (from the past 240 hour(s))  SARS Coronavirus 2 by RT PCR (hospital order, performed in [facility] hospital lab) *cepheid single result test* Anterior Nasal Swab     Status: None   Collection Time: 09/08/22  1:34 PM   Specimen: Anterior Nasal Swab  Result Value Ref Range Status   SARS Coronavirus 2 by RT PCR NEGATIVE NEGATIVE Final    Comment: (NOTE) SARS-CoV-2 target nucleic acids are NOT DETECTED.  The SARS-CoV-2 RNA is generally detectable in upper and lower respiratory specimens during the acute phase of infection. The lowest concentration of SARS-CoV-2 viral copies this assay can detect is 250 copies / mL. A negative result does not preclude SARS-CoV-2 infection and should not be used as the sole basis for treatment or other patient management decisions.  A negative result may occur with improper specimen collection /  handling, submission of specimen other than nasopharyngeal swab, presence of viral mutation(s) within the areas targeted by this assay, and inadequate number of viral copies (<250 copies / mL). A negative result must be combined with clinical observations, patient history, and epidemiological information.  Fact Sheet for Patients:   RoadLapTop.co.za  Fact Sheet for Healthcare Providers: http://kim-miller.com/  This test is not yet approved or  cleared by the [place] FDA  and has been authorized for detection and/or diagnosis of SARS-CoV-2 by FDA under an Emergency Use Authorization (EUA).  This EUA will remain in effect (meaning this test can be used) for the duration of the COVID-19 declaration under Section 564(b)(1) of the Act, 21 U.S.C. section 360bbb-3(b)(1), unless the authorization is terminated or revoked sooner.  Performed at [facility], [address] [place], [place] [address]   C Difficile Quick Screen w PCR reflex     Status: Abnormal   Collection Time: 09/09/22 12:42 PM   Specimen: STOOL  Result Value Ref Range Status   C Diff antigen POSITIVE (A) NEGATIVE Final   C Diff toxin NEGATIVE NEGATIVE Final   C Diff interpretation Results are indeterminate. See PCR results.  Final    Comment: Performed at [facility], [address] [place], [place] [address]  C. Diff by PCR, Reflexed     Status: None   Collection Time: 09/09/22 12:42 PM  Result Value Ref Range Status   Toxigenic C. Difficile by PCR NEGATIVE NEGATIVE Final    Comment: Patient is colonized with non toxigenic C. difficile. May not need treatment unless significant symptoms are present. Performed at [facility], [address] [place], [place] [address]   Culture, blood (Routine X 2) w Reflex to ID Panel     Status: None (Preliminary result)   Collection Time: 09/13/22  9:18 PM   Specimen: BLOOD RIGHT HAND  Result Value Ref Range Status   Specimen Description BLOOD RIGHT HAND  Final   Special Requests   Final    BOTTLES DRAWN AEROBIC ONLY Blood Culture adequate volume   Culture   Final    NO GROWTH < 12 HOURS Performed at [facility], [address] [place], [place] [address]    Report Status PENDING  Incomplete  Culture, blood (Routine X 2) w Reflex to ID Panel     Status: None (Preliminary result)   Collection Time: 09/13/22  9:21 PM   Specimen: BLOOD LEFT HAND  Result Value Ref Range Status   Specimen  Description BLOOD LEFT HAND  Final   Special Requests   Final    BOTTLES DRAWN AEROBIC AND ANAEROBIC Blood Culture adequate volume   Culture   Final    NO GROWTH < 12 HOURS Performed at [facility], [address] [place], [place] [address]    Report Status PENDING  Incomplete    Radiology Reports DG CHEST PORT 1 VIEW  Result Date: 09/13/2022 CLINICAL DATA:  Dyspnea, shortness of breath. EXAM: PORTABLE CHEST 1 VIEW COMPARISON:  Chest radiograph and CT earlier today FINDINGS: Stable low lung volumes. Unchanged heart size and mediastinal contours. Bibasilar atelectasis. Minimal right pleural effusion. No pneumothorax. IMPRESSION: Low lung volumes with bibasilar atelectasis. Left lung base atelectasis has increased from earlier today. Minimal right pleural effusion. Electronically Signed   By: Narda Rutherford M.D.   On: 09/13/2022 18:09    SIGNED: Kendell Bane, MD, FHM. FAAFP. [facility] - [facility] Time  spent - 55  min.  In seeing, evaluating and examining the patient. Reviewing medical records, labs, drawn plan of care. [facility],  Pager (please use [email or web address] to page/ text) Please use Epic Secure Chat for non-urgent communication (7AM-7PM)  If 7PM-7AM, please contact night-coverage [email or web address], 09/14/2022, 1:50 PM

## 2022-09-14 NOTE — Progress Notes (Signed)
Progress Note  Patient Name: Christine Hurley Date of Encounter: 09/14/2022  Primary Cardiologist: Chilton Si, MD   Subjective   "About to have a panic attack"  Inpatient Medications    Scheduled Meds:  atorvastatin  20 mg Oral QHS   cilostazol  100 mg Oral BID   insulin aspart  0-15 Units Subcutaneous TID WC   insulin aspart  0-5 Units Subcutaneous QHS   metoprolol tartrate  25 mg Oral BID   pantoprazole  20 mg Oral Daily   sodium chloride flush  3 mL Intravenous Q12H   umeclidinium bromide  1 puff Inhalation Daily   Continuous Infusions:  azithromycin Stopped (09/13/22 2338)   cefTRIAXone (ROCEPHIN)  IV Stopped (09/13/22 2204)   heparin 1,100 Units/hr (09/14/22 0132)   PRN Meds: acetaminophen **OR** acetaminophen, albuterol, nicotine polacrilex, polyethylene glycol   Vital Signs    Vitals:   09/14/22 0200 09/14/22 0400 09/14/22 0600 09/14/22 0746  BP: (!) 136/96 (!) 131/91 133/80 (!) 123/90  Pulse: 85 86 92 91  Resp:  16  18  Temp:  97.8 F (36.6 C)  97.8 F (36.6 C)  TempSrc:  Oral  Oral  SpO2: 97% 98% 98% 97%  Weight:      Height:        Intake/Output Summary (Last 24 hours) at 09/14/2022 0811 Last data filed at 09/14/2022 0700 Gross per 24 hour  Intake 483.51 ml  Output 1600 ml  Net -1116.49 ml   Filed Weights   09/13/22 0719 09/13/22 1651  Weight: 63.5 kg 66.1 kg    Telemetry    Sinus rhythm - Personally Reviewed  ECG    Anterior ST elevation on admission - Personally Reviewed  Physical Exam   GEN: No acute distress.   Neck: No JVD Cardiac: RRR, no murmurs, rubs, or gallops.  Respiratory: Clear to auscultation bilaterally. GI: Soft, nontender, non-distended  MS: No edema; No deformity. Neuro:  Nonfocal  Psych: Normal affect   Labs    Chemistry Recent Labs  Lab 09/12/22 0446 09/13/22 0727 09/13/22 0743 09/14/22 0024  NA 133* 132* 135 137  K 3.7 4.1 4.1 4.1  CL 104 101 106 102  CO2 20* 22  --  17*  GLUCOSE 104* 109* 93  150*  BUN 19 20 19 18   CREATININE 0.64 0.69 0.50 0.90  CALCIUM 8.4* 8.9  --  8.6*  PROT 6.5 7.5  --  6.8  ALBUMIN 3.0* 3.3*  --  2.9*  AST 252* 288*  --  235*  ALT 245* 263*  --  236*  ALKPHOS 185* 198*  --  174*  BILITOT 1.0 0.8  --  1.0  GFRNONAA >60 >60  --  >60  ANIONGAP 9 9  --  18*     Hematology Recent Labs  Lab 09/12/22 0446 09/13/22 0727 09/13/22 0743 09/14/22 0024  WBC 6.7 8.0  --  8.4  RBC 3.87 4.17  --  4.02  HGB 11.5* 12.3 10.9* 11.7*  HCT 35.1* 37.2 32.0* 35.3*  MCV 90.7 89.2  --  87.8  MCH 29.7 29.5  --  29.1  MCHC 32.8 33.1  --  33.1  RDW 15.3 15.7*  --  15.7*  PLT 235 307  --  324    Cardiac EnzymesNo results for input(s): "TROPONINI" in the last 168 hours. No results for input(s): "TROPIPOC" in the last 168 hours.   BNP Recent Labs  Lab 09/08/22 1353 09/13/22 0841  BNP 91.1 278.5*  DDimer No results for input(s): "DDIMER" in the last 168 hours.   Summary of Pertinent studies    TTE: 8/3 ejection fraction is 50 to 55% with anterior, mid, and basal septal hypokinesis  Cardiac cath: 09/10/2022 normal coronary arteries  Imaging: CMR ordered  Labs: TnI peaked 1,521. CK 2,683 ESR 56  Patient Profile     72 y.o. female with stage IIIa hepatocellular carcinoma, recently initiated therapy with durvalumab and Imjudo who has had recurrent presentations with chest pain and ST elevation on EKG with normal coronary arteries.  Assessment & Plan    Myopericarditis ST elevation in the absence of CAD, PR elevation in Avr, positional pain, elevated troponin, inflammatory biomoarkers, new wall motion abnormality in a non-coronary pattern  EF is preserved overall Attributable to immunotherapy for hepatocellular carcinoma Patient is receiving steroids; we will defer steroid management to oncology We will follow closely from cardiac perspective Plan for CMRI  Hepatocellular carcinoma IIIa multiple liver masses Complicated by portal vein  thrombosis   Portal vein thrombosis Heparin started during initial evaluation From the cardiac perspective, she may be transitioned to Eliquis  Hypertension Continue metoprolol 25 Continue amlodipine 10  Anxiety Consider palliative consult Will give a single dose of ativan      For questions or updates, please contact [facility] Please consult [email or web address] for contact info under Cardiology/STEMI.      Signed, Maurice Small, MD 09/14/2022, 8:11 AM

## 2022-09-14 NOTE — Progress Notes (Signed)
IP PROGRESS NOTE  Subjective:   Ms. Graber reports improvement in chest pain and dyspnea.  She continues to have chest pain with prolonged lying.  No new complaint. Objective: Vital signs in last 24 hours: Blood pressure 133/80, pulse 92, temperature 97.8 F (36.6 C), temperature source Oral, resp. rate 16, height 5\' 6"  (1.676 m), weight 145 lb 11.6 oz (66.1 kg), SpO2 98%.  Intake/Output from previous day: 08/03 0701 - 08/04 0700 In: 483.5 [I.V.:132; IV Piggyback:351.5] Out: 1600 [Urine:1600]  Physical Exam:  HEENT: No thrush Lungs: Decreased breath sounds at the lower posterior chest bilaterally, no respiratory distress Cardiac: Regular rate and rhythm, no rub Abdomen: No hepatosplenomegaly, mild tenderness in the right subcostal region Extremities: No leg edema Skin: No rash  Lab Results: Recent Labs    09/13/22 0727 09/13/22 0743 09/14/22 0024  WBC 8.0  --  8.4  HGB 12.3 10.9* 11.7*  HCT 37.2 32.0* 35.3*  PLT 307  --  324    BMET Recent Labs    09/13/22 0727 09/13/22 0743 09/14/22 0024  NA 132* 135 137  K 4.1 4.1 4.1  CL 101 106 102  CO2 22  --  17*  GLUCOSE 109* 93 150*  BUN 20 19 18   CREATININE 0.69 0.50 0.90  CALCIUM 8.9  --  8.6*    Lab Results  Component Value Date   CEA1 2.4 07/19/2022   ZOX096 <2 07/19/2022    Studies/Results: DG CHEST PORT 1 VIEW  Result Date: 09/13/2022 CLINICAL DATA:  Dyspnea, shortness of breath. EXAM: PORTABLE CHEST 1 VIEW COMPARISON:  Chest radiograph and CT earlier today FINDINGS: Stable low lung volumes. Unchanged heart size and mediastinal contours. Bibasilar atelectasis. Minimal right pleural effusion. No pneumothorax. IMPRESSION: Low lung volumes with bibasilar atelectasis. Left lung base atelectasis has increased from earlier today. Minimal right pleural effusion. Electronically Signed   By: Narda Rutherford M.D.   On: 09/13/2022 18:09   ECHOCARDIOGRAM LIMITED  Result Date: 09/13/2022    ECHOCARDIOGRAM LIMITED  REPORT   Patient Name:   SACRED ROA Date of Exam: 09/13/2022 Medical Rec #:  [id number]       Height:       66.0 in Accession #:    [id number]      Weight:       140.0 lb Date of Birth:  1950-08-24       BSA:          1.719 m Patient Age:    72 years        BP:           136/84 mmHg Patient Gender: F               HR:           98 bpm. Exam Location:  Inpatient Procedure: Limited Echo and Limited Color Doppler Indications:     Chest Pain R07.9  History:         Patient has prior history of Echocardiogram examinations, most                  recent 09/09/2022. CAD and Previous Myocardial Infarction, PAD,                  Arrythmias:Tachycardia; Risk Factors:Hypertension, Current                  Smoker and Dyslipidemia.  Sonographer:     Lucendia Herrlich Referring Phys:  [id number] Perlie Gold Diagnosing Phys: Weston Brass MD  IMPRESSIONS  1. Left ventricular ejection fraction, by estimation, is 50 to 55%. The left ventricle has low normal function. The left ventricle demonstrates regional wall motion abnormalities (see scoring diagram/findings for description).  2. Right ventricular systolic function is mildly reduced. The right ventricular size is normal. The estimated right ventricular systolic pressure is 28.4 mmHg.  3. The mitral valve is grossly normal. Trivial mitral valve regurgitation.  4. The aortic valve is grossly normal. Aortic valve regurgitation is trivial.  5. The inferior vena cava is dilated in size with <50% respiratory variability, suggesting right atrial pressure of 15 mmHg. FINDINGS  Left Ventricle: Left ventricular ejection fraction, by estimation, is 50 to 55%. The left ventricle has low normal function. The left ventricle demonstrates regional wall motion abnormalities. There is borderline left ventricular hypertrophy.  LV Wall Scoring: The anterior septum, mid inferoseptal segment, and basal inferoseptal segment are hypokinetic. Right Ventricle: The right ventricular size is normal. Right  ventricular systolic function is mildly reduced. The tricuspid regurgitant velocity is 1.83 m/s, and with an assumed right atrial pressure of 15 mmHg, the estimated right ventricular systolic pressure is 28.4 mmHg. Pericardium: Trivial pericardial effusion is present. Mitral Valve: The mitral valve is grossly normal. Trivial mitral valve regurgitation. Tricuspid Valve: The tricuspid valve is grossly normal. Tricuspid valve regurgitation is trivial. Aortic Valve: The aortic valve is grossly normal. Aortic valve regurgitation is trivial. Venous: The inferior vena cava is dilated in size with less than 50% respiratory variability, suggesting right atrial pressure of 15 mmHg. LEFT VENTRICLE PLAX 2D LVIDd:         4.00 cm LVIDs:         2.60 cm LV PW:         0.90 cm LV IVS:        1.00 cm  IVC IVC diam: 2.30 cm LEFT ATRIUM         Index LA diam:    3.40 cm 1.98 cm/m  TRICUSPID VALVE TR Peak grad:   13.4 mmHg TR Vmax:        183.00 cm/s Weston Brass MD Electronically signed by Weston Brass MD Signature Date/Time: 09/13/2022/11:20:31 AM    Final (Updated)    CT Angio Chest/Abd/Pel for Dissection W and/or Wo Contrast  Result Date: 09/13/2022 CLINICAL DATA:  Shortness of breath, recent NSTEMI and cardiac catheterization, suspected multifocal hepatocellular carcinoma * Tracking Code: BO * EXAM: CT ANGIOGRAPHY CHEST, ABDOMEN AND PELVIS TECHNIQUE: Non-contrast CT of the chest was initially obtained. Multidetector CT imaging through the chest, abdomen and pelvis was performed using the standard protocol during bolus administration of intravenous contrast. Multiplanar reconstructed images and MIPs were obtained and reviewed to evaluate the vascular anatomy. RADIATION DOSE REDUCTION: This exam was performed according to the departmental dose-optimization program which includes automated exposure control, adjustment of the mA and/or kV according to patient size and/or use of iterative reconstruction technique. CONTRAST:   OMNIPAQUE IOHEXOL 350 MG/ML SOLN COMPARISON:  CT chest angiogram, 09/08/2022, CT chest abdomen pelvis angiogram, 07/18/2022 MR abdomen, 07/19/2018 FINDINGS: CTA CHEST FINDINGS VASCULAR Aorta: Satisfactory opacification of the aorta. Normal contour and caliber of the thoracic aorta. No evidence of aneurysm, dissection, or other acute aortic pathology. Moderate mixed calcific atherosclerosis. Cardiovascular: No evidence of pulmonary embolism on limited non-tailored examination. Mild cardiomegaly. Left and right coronary artery calcifications. No pericardial effusion. Review of the MIP images confirms the above findings. NON VASCULAR Mediastinum/Nodes: No enlarged mediastinal, hilar, or axillary lymph nodes. Thyroid gland, trachea, and esophagus  demonstrate no significant findings. Lungs/Pleura: New small right pleural effusion. Similar dependent bibasilar atelectasis or consolidation. Musculoskeletal: No chest wall abnormality. No acute osseous findings. Review of the MIP images confirms the above findings. CTA ABDOMEN AND PELVIS FINDINGS VASCULAR Normal contour and caliber of the abdominal aorta. No evidence of aneurysm, dissection, or other acute aortic pathology. Standard branching pattern of the abdominal aorta with solitary bilateral renal arteries. Moderate mixed calcific atherosclerosis. Review of the MIP images confirms the above findings. NON-VASCULAR Hepatobiliary: Coarse, nodular cirrhotic morphology of the liver, again with multiple hyperenhancing lesions throughout the liver and extensive portal venous thrombus, better assessed by prior MR (series 11, image 124). No gallstones, gallbladder wall thickening, or biliary dilatation. Pancreas: Unremarkable. No pancreatic ductal dilatation or surrounding inflammatory changes. Spleen: Normal in size without significant abnormality. Adrenals/Urinary Tract: Adrenal glands are unremarkable. Kidneys are normal, without renal calculi, solid lesion, or  hydronephrosis. Bladder is unremarkable. Stomach/Bowel: Stomach is within normal limits. Appendix appears normal. No evidence of bowel wall thickening, distention, or inflammatory changes. Lymphatic: No enlarged abdominal or pelvic lymph nodes. Reproductive: No mass or other significant abnormality. Other: No abdominal wall hernia or abnormality. Moderate volume simple fluid attenuation ascites throughout the abdomen and pelvis. Musculoskeletal: No acute osseous findings. IMPRESSION: 1. No evidence of aortic aneurysm, dissection, or other acute aortic pathology. 2. No evidence of pulmonary embolism on limited non-tailored examination. 3. New small right pleural effusion. Similar dependent bibasilar atelectasis or consolidation. 4. Coarse, nodular cirrhotic morphology of the liver, again with multiple hyperenhancing lesions throughout the liver and extensive portal venous thrombus, better assessed by prior MR and presumably reflecting multifocal hepatocellular carcinoma. 5. Moderate volume simple fluid attenuation ascites throughout the abdomen and pelvis. 6. Coronary artery disease. Aortic Atherosclerosis (ICD10-I70.0). Electronically Signed   By: Jearld Lesch M.D.   On: 09/13/2022 08:39   DG Chest Port 1 View  Result Date: 09/13/2022 CLINICAL DATA:  Code STEMI EXAM: PORTABLE CHEST 1 VIEW COMPARISON:  09/08/2022 radiograph and chest CT FINDINGS: Low volume chest with hazy density at the bases, greater on the right. Aortic tortuosity accentuated by rotation, unchanged. No edema, effusion, or pneumothorax. IMPRESSION: Low volume chest with atelectasis at the bases, stable from recent chest CT. Electronically Signed   By: Tiburcio Pea M.D.   On: 09/13/2022 08:12    Medications: I have reviewed the patient's current medications.  Assessment/Plan:  Hepatocellular carcinoma, multifocal liver masses, status post 1 treatment with tremelimumab and durvalumab on 08/15/2022 COPD History of hepatitis C Chronic  diarrhea Peripheral arterial disease Admission 09/08/2022 with chest pain and dyspnea, nonobstructive CAD on cardiac catheterization 09/10/2022, treated with medical therapy for NSTEMI Admission 09/13/2022 with recurrent chest pain and dyspnea, ST elevation, elevated troponin, clinical diagnosis of myocarditis Solu-Medrol 1000 mg 09/13/2022  Ms. Vreeland has hepatocellular carcinoma.  She completed 1 treatment with tremelimumab and durvalumab 08/15/2022.  She was admitted earlier this week with chest pain and dyspnea and diagnosed with an NSTEMI, treated with medical therapy.  She presented yesterday with persistent dyspnea and positional chest discomfort.  The troponin was higher compared to earlier this week.  Cardiology feels she has myocarditis..  There is a significant probability of myocarditis.  She began steroid therapy yesterday.  Her clinical status appears improved today.  The CK is elevated, likely cardiac related, but she could have a component of myositis.   We will consider alternate treatment option for the hepatocellular carcinoma based on her recovery from the acute presentation.  Recommendations: Cardiac  monitoring, follow-up of troponin, and medical therapy for heart disease per cardiology Continue steroids for myocarditis, dosing per cardiology, I am not sure if she needs the 1000 mg Solu-Medrol dose Hold durvalumab  Consider additional immunosuppressive treatments based on response to steroids over the next few days Check CK mb Monitor elevated liver enzymes-potentially increased secondary to hepatocellular carcinoma versus toxicity from immunotherapy Oncology will continue following her in the hospital, Dr. [name] will return on 09/15/2022.   LOS: 1 day   Thornton Papas, MD   09/14/2022, 7:38 AM

## 2022-09-15 ENCOUNTER — Inpatient Hospital Stay (HOSPITAL_COMMUNITY): Payer: Medicare HMO

## 2022-09-15 ENCOUNTER — Other Ambulatory Visit: Payer: Self-pay

## 2022-09-15 DIAGNOSIS — C220 Liver cell carcinoma: Secondary | ICD-10-CM | POA: Diagnosis not present

## 2022-09-15 DIAGNOSIS — I81 Portal vein thrombosis: Secondary | ICD-10-CM | POA: Diagnosis not present

## 2022-09-15 DIAGNOSIS — J90 Pleural effusion, not elsewhere classified: Secondary | ICD-10-CM | POA: Diagnosis not present

## 2022-09-15 DIAGNOSIS — I408 Other acute myocarditis: Secondary | ICD-10-CM | POA: Diagnosis not present

## 2022-09-15 LAB — RESPIRATORY PANEL BY PCR

## 2022-09-15 LAB — GLUCOSE, CAPILLARY
Glucose-Capillary: 132 mg/dL — ABNORMAL HIGH (ref 70–99)
Glucose-Capillary: 147 mg/dL — ABNORMAL HIGH (ref 70–99)
Glucose-Capillary: 132 mg/dL — ABNORMAL HIGH (ref 70–99)
Glucose-Capillary: 134 mg/dL — ABNORMAL HIGH (ref 70–99)

## 2022-09-15 LAB — SARS CORONAVIRUS 2 BY RT PCR: SARS Coronavirus 2 by RT PCR: NEGATIVE

## 2022-09-15 LAB — TROPONIN I (HIGH SENSITIVITY): Troponin I (High Sensitivity): 600 ng/L (ref <18)

## 2022-09-15 MED ORDER — GADOBUTROL 1 MMOL/ML IV SOLN
6.0000 mL | Freq: Once | INTRAVENOUS | Status: AC | PRN
  Administered 2022-09-15: 6 mL via INTRAVENOUS

## 2022-09-15 MED ORDER — PANTOPRAZOLE SODIUM 40 MG PO TBEC
40.0000 mg | DELAYED_RELEASE_TABLET | Freq: Every day | ORAL | Status: DC
  Administered 2022-09-16 – 2022-09-20 (×5): 40 mg via ORAL
  Filled 2022-09-15 (×5): qty 1

## 2022-09-15 MED ORDER — COLCHICINE 0.6 MG PO TABS
0.6000 mg | ORAL_TABLET | Freq: Two times a day (BID) | ORAL | Status: DC
  Administered 2022-09-15 – 2022-09-18 (×7): 0.6 mg via ORAL
  Filled 2022-09-15 (×7): qty 1

## 2022-09-15 MED ORDER — LORAZEPAM 0.5 MG PO TABS
0.5000 mg | ORAL_TABLET | Freq: Once | ORAL | Status: AC | PRN
  Administered 2022-09-15: 0.5 mg via ORAL
  Filled 2022-09-15: qty 1

## 2022-09-15 MED ORDER — FUROSEMIDE 10 MG/ML IJ SOLN
40.0000 mg | Freq: Two times a day (BID) | INTRAMUSCULAR | Status: DC
  Administered 2022-09-15 – 2022-09-17 (×6): 40 mg via INTRAVENOUS
  Filled 2022-09-15 (×7): qty 4

## 2022-09-15 NOTE — Plan of Care (Signed)
  Problem: Education: Goal: Understanding of cardiac disease, CV risk reduction, and recovery process will improve Outcome: Progressing Goal: Individualized Educational Video(s) Outcome: Progressing   Problem: Activity: Goal: Ability to tolerate increased activity will improve Outcome: Progressing   Problem: Cardiac: Goal: Ability to achieve and maintain adequate cardiovascular perfusion will improve Outcome: Progressing   Problem: Health Behavior/Discharge Planning: Goal: Ability to safely manage health-related needs after discharge will improve Outcome: Progressing   Problem: Education: Goal: Understanding of CV disease, CV risk reduction, and recovery process will improve Outcome: Progressing Goal: Individualized Educational Video(s) Outcome: Progressing   Problem: Activity: Goal: Ability to return to baseline activity level will improve Outcome: Progressing   Problem: Cardiovascular: Goal: Ability to achieve and maintain adequate cardiovascular perfusion will improve Outcome: Progressing Goal: Vascular access site(s) Level 0-1 will be maintained Outcome: Progressing   Problem: Health Behavior/Discharge Planning: Goal: Ability to safely manage health-related needs after discharge will improve Outcome: Progressing   Problem: Education: Goal: Ability to describe self-care measures that may prevent or decrease complications (Diabetes Survival Skills Education) will improve Outcome: Progressing Goal: Individualized Educational Video(s) Outcome: Progressing   Problem: Coping: Goal: Ability to adjust to condition or change in health will improve Outcome: Progressing   Problem: Fluid Volume: Goal: Ability to maintain a balanced intake and output will improve Outcome: Progressing   Problem: Health Behavior/Discharge Planning: Goal: Ability to identify and utilize available resources and services will improve Outcome: Progressing Goal: Ability to manage health-related  needs will improve Outcome: Progressing   Problem: Metabolic: Goal: Ability to maintain appropriate glucose levels will improve Outcome: Progressing   Problem: Nutritional: Goal: Maintenance of adequate nutrition will improve Outcome: Progressing Goal: Progress toward achieving an optimal weight will improve Outcome: Progressing   Problem: Skin Integrity: Goal: Risk for impaired skin integrity will decrease Outcome: Progressing   Problem: Tissue Perfusion: Goal: Adequacy of tissue perfusion will improve Outcome: Progressing   Problem: Education: Goal: Knowledge of General Education information will improve Description: Including pain rating scale, medication(s)/side effects and non-pharmacologic comfort measures Outcome: Progressing   Problem: Health Behavior/Discharge Planning: Goal: Ability to manage health-related needs will improve Outcome: Progressing   Problem: Clinical Measurements: Goal: Ability to maintain clinical measurements within normal limits will improve Outcome: Progressing Goal: Will remain free from infection Outcome: Progressing Goal: Diagnostic test results will improve Outcome: Progressing Goal: Respiratory complications will improve Outcome: Progressing Goal: Cardiovascular complication will be avoided Outcome: Progressing   Problem: Activity: Goal: Risk for activity intolerance will decrease Outcome: Progressing   Problem: Nutrition: Goal: Adequate nutrition will be maintained Outcome: Progressing   Problem: Coping: Goal: Level of anxiety will decrease Outcome: Progressing   Problem: Elimination: Goal: Will not experience complications related to bowel motility Outcome: Progressing Goal: Will not experience complications related to urinary retention Outcome: Progressing   Problem: Pain Managment: Goal: General experience of comfort will improve Outcome: Progressing   Problem: Safety: Goal: Ability to remain free from injury will  improve Outcome: Progressing   Problem: Skin Integrity: Goal: Risk for impaired skin integrity will decrease Outcome: Progressing

## 2022-09-15 NOTE — Progress Notes (Addendum)
Progress Note  Patient Name: Christine Hurley Date of Encounter: 09/15/2022  Primary Cardiologist: Chilton Si, MD   Subjective   BP 132/95.  Reports she can have persistent chest pain, worse with laying flat or taking deep breath.  Also feels short of breath  Inpatient Medications    Scheduled Meds:  apixaban  5 mg Oral BID   cilostazol  100 mg Oral BID   insulin aspart  0-15 Units Subcutaneous TID WC   insulin aspart  0-5 Units Subcutaneous QHS   methylPREDNISolone (SOLU-MEDROL) injection  125 mg Intravenous Q12H   metoprolol tartrate  25 mg Oral BID   pantoprazole  20 mg Oral Daily   sodium chloride flush  3 mL Intravenous Q12H   umeclidinium bromide  1 puff Inhalation Daily   Continuous Infusions:  azithromycin Stopped (09/14/22 2312)   cefTRIAXone (ROCEPHIN)  IV Stopped (09/14/22 2235)   PRN Meds: albuterol, ibuprofen, nicotine polacrilex, polyethylene glycol   Vital Signs    Vitals:   09/15/22 0400 09/15/22 0448 09/15/22 0600 09/15/22 0805  BP: (!) 135/93   (!) 132/95  Pulse: 99 (!) 101  (!) 103  Resp:      Temp: 97.9 F (36.6 C)   97.6 F (36.4 C)  TempSrc: Axillary   Oral  SpO2: 93% 95%  93%  Weight:   65.9 kg   Height:        Intake/Output Summary (Last 24 hours) at 09/15/2022 0853 Last data filed at 09/15/2022 0981 Gross per 24 hour  Intake 952.68 ml  Output 700 ml  Net 252.68 ml   Filed Weights   09/13/22 0719 09/13/22 1651 09/15/22 0600  Weight: 63.5 kg 66.1 kg 65.9 kg    Telemetry    Sinus rhythm - Personally Reviewed  ECG    Anterior ST elevation on admission - Personally Reviewed  Physical Exam   GEN: No acute distress.   Neck: + JVD Cardiac: RRR, no murmurs, rubs, or gallops.  Respiratory: Diminished breath sounds GI: Soft, nontender, non-distended  MS: No edema; No deformity. Neuro:  Nonfocal  Psych: Normal affect   Labs    Chemistry Recent Labs  Lab 09/13/22 0727 09/13/22 0743 09/14/22 0024 09/15/22 0208  NA  132* 135 137 137  K 4.1 4.1 4.1 4.1  CL 101 106 102 104  CO2 22  --  17* 21*  GLUCOSE 109* 93 150* 135*  BUN 20 19 18  26*  CREATININE 0.69 0.50 0.90 0.76  CALCIUM 8.9  --  8.6* 8.9  PROT 7.5  --  6.8 7.2  ALBUMIN 3.3*  --  2.9* 3.1*  AST 288*  --  235* 248*  ALT 263*  --  236* 418*  ALKPHOS 198*  --  174* 181*  BILITOT 0.8  --  1.0 0.7  GFRNONAA >60  --  >60 >60  ANIONGAP 9  --  18* 12     Hematology Recent Labs  Lab 09/12/22 0446 09/13/22 0727 09/13/22 0743 09/14/22 0024  WBC 6.7 8.0  --  8.4  RBC 3.87 4.17  --  4.02  HGB 11.5* 12.3 10.9* 11.7*  HCT 35.1* 37.2 32.0* 35.3*  MCV 90.7 89.2  --  87.8  MCH 29.7 29.5  --  29.1  MCHC 32.8 33.1  --  33.1  RDW 15.3 15.7*  --  15.7*  PLT 235 307  --  324    Cardiac EnzymesNo results for input(s): "TROPONINI" in the last 168 hours. No results for input(s): "  TROPIPOC" in the last 168 hours.   BNP Recent Labs  Lab 09/08/22 1353 09/13/22 0841  BNP 91.1 278.5*     DDimer No results for input(s): "DDIMER" in the last 168 hours.   Summary of Pertinent studies    TTE: 8/3 ejection fraction is 50 to 55% with anterior, mid, and basal septal hypokinesis  Cardiac cath: 09/10/2022 normal coronary arteries  Imaging: CMR ordered  Labs: TnI peaked 1,521. 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  She was admitted 09/08/2022 with NSTEMI.  Troponin peak 483.  Echo 7/30 showed EF 60 to 65%, normal RV function.  Cath 7/31 showed mild nonobstructive CAD.  She was discharged and presented back on 8/3 with chest pain and troponin elevation to 1521.  Echo 8/3 shows EF 50 to 55%, mild RV dysfunction.  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 prednisone daily, and taper over 4 to 6 weeks.  Today is 3rd day of IV solumedrol.  Seems to be responding to steroids, troponin has been downtrending, would plan likely transition to PO prednisone 60 mg daily tomorrow then wean by 10 mg weekly until off.  Discussed with oncology and pharmacy -Suspect component of pericarditis as well, as reports chest pain worsened with deep inspiration or lying flat.  Will add colchicine  Acute diastolic heart failure: in setting of suspected myocarditis as above. Appears volume overloaded, will start IV lasix 40 mg BID.  Portal vein thrombosis: Started on heparin initially, has transitioned to Eliquis  Hepatocellular carcinoma: Oncology following      For questions or updates, please contact [facility] Please consult [email or web address] for contact info under Cardiology/STEMI.      Signed, Little Ishikawa, MD 09/15/2022, 8:53 AM

## 2022-09-15 NOTE — Consult Note (Signed)
   [facility] CM Inpatient Consult   09/15/2022  Christine Hurley [birth year] [id number]  [facility] [[facility]]  Accountable Care Organization [ACO] Patient: [employer]  Primary Care Provider: Willow Ora, MD with [place] [facility] is listed to provide the transition of care  Patient evaluated for less than 7 days readmission community based chronic complex disease management services with [facility] Care Management Program as a benefit of patient's [employer].    Plan:  Continue to follow with inpatient [facility] team for disposition, patient with ongoing medical management needs at this time.     Of note, [facility] Care Management services does not replace or interfere with any services that are arranged by inpatient case management or social work.  For additional questions or referrals please contact:    Charlesetta Shanks, RN BSN CCM [facility]  [phone] business mobile phone Toll free office [phone]  *Concierge Line  [phone] Fax number: [phone] [email or web address]

## 2022-09-15 NOTE — Progress Notes (Signed)
PROGRESS NOTE    Christine Hurley  WUJ:[id number] DOB: [birth year] DOA: 09/13/2022 PCP: Willow Ora, MD    Brief Narrative:   Christine Hurley is a 72 y.o. female with medical history significant of hepatocellular carcinoma-liver mass (s/p 1 treatment with tremelimumab and durvalumab on 08/15/2022 who was discharged from [facility] long hospital one day piror to presentation for suspected non-ST elevation MI.  She did have angiogram done and there was no findings for severe CAD so no intervention was done.  After she went home she again complained of chest pain with exertional dyspnea followed by progressive shortness of breath to the extent of shortness of breath at rest.  In the ED, workup showed worsening troponin, CK  as well as new EKG changes.  Cardiology and patient's primary oncologist has seen the patient and have determined the patient may have pericarditis, myocarditis patient has been started on IV steroid, oxygen supplementation.  Assessment and Plan:  * Myocarditis (HCC)/myopericarditis Mildly improved chest discomfort but still with shortness of breath.  Had episode of breathing difficulty in the night.  History of non-STEMI S/P cardiac cath 09/10/2022.  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 Per oncology received Solu-Medrol 1000 mg on 09/13/2022, continue with 125 mg IV twice daily.  Day 3 of IV steroids.  Plan is to continue steroid for 4 to 6 weeks on discharge.  Likely transition to prednisone soon.  Cardiology on board and underwent  MRI of the heart on 09/15/2022.  Troponins are downtrending.  Report pending.   Fluid overload Mild pleural effusion on CAT scan as well as a small amount of simple fluid attenuation ascites throughout abdomen and pelvis.  Cardiology on board and patient has been started on Lasix 40 twice daily. colchicine has been initiated as well.  Question pneumonia so was started on Rocephin and Zithromax.  CRP was 0.7.  Sed rate was  elevated at 48.  Procalcitonin at 0.8.  Hepatocellular carcinoma IIA  Durvalumab and tremelimumab have been reported to cause myocarditis and pericarditis .  Continue to hold.  Oncology on board.  Initially received 1 g of Solu-Medrol followed by 125 twice daily.   Portal vein thrombosis Diagnosed in June 2024.  Was initially on heparin drip which has been changed to Eliquis.   Nicotine use disorder.  On nicotine patch.  Chronic hepatitis C without hepatic coma (HCC) Continue to monitor LFTs.    DVT prophylaxis: SCDs Start: 09/13/22 1142 apixaban (ELIQUIS) tablet 5 mg   Code Status:     Code Status: Full Code  Disposition: Uncertain at this time.  Likely home in 2 to 3 days.  Status is: Inpatient  Remains inpatient appropriate because: IV high-dose Solu-Medrol, IV diuretics, pending clinical improvement.   Family Communication: Spoke with the patient's son at bedside.  Consultants:  Cardiology Oncology  Procedures:  None   Antimicrobials:  Cardiac MRI  Anti-infectives (From admission, onward)    Start     Dose/Rate Route Frequency Ordered Stop   09/13/22 2130  cefTRIAXone (ROCEPHIN) 1 g in sodium chloride 0.9 % 100 mL IVPB        1 g 200 mL/hr over 30 Minutes Intravenous Every 24 hours 09/13/22 2044     09/13/22 2130  azithromycin (ZITHROMAX) 500 mg in sodium chloride 0.9 % 250 mL IVPB        500 mg 250 mL/hr over 60 Minutes Intravenous Every 24 hours 09/13/22 2044 09/16/22 2129       Subjective: Today, patient  was seen and examined at bedside.  Patient's son at bedside.  Complains of shortness of breath especially had a spell in the night with shortness of breath.  Has some chest discomfort mostly on lying flat or taking a deep breath.  Feels a little better than yesterday.  No fever chills or rigor.  Objective: Vitals:   09/15/22 0600 09/15/22 0805 09/15/22 1000 09/15/22 1200  BP:  (!) 132/95 (!) 142/104 (!) 144/96  Pulse:  (!) 103 (!) 101   Resp:       Temp:  97.6 F (36.4 C)    TempSrc:  Oral    SpO2:  93% 94%   Weight: 65.9 kg     Height:        Intake/Output Summary (Last 24 hours) at 09/15/2022 1328 Last data filed at 09/15/2022 0800 Gross per 24 hour  Intake 472.68 ml  Output 450 ml  Net 22.68 ml   Filed Weights   09/13/22 0719 09/13/22 1651 09/15/22 0600  Weight: 63.5 kg 66.1 kg 65.9 kg    Physical Examination: Body mass index is 23.45 kg/m.   General:  Average built, not in obvious distress, on nasal cannula oxygen at 2 L/min. HENT:   No scleral pallor or icterus noted. Oral mucosa is moist.  Distended neck veins. Chest:  .  Diminished breath sounds bilaterally.  Tachypneic CVS: S1 &S2 heard. No murmur.  Regular rate and rhythm. Abdomen: Soft, mild tenderness over the right upper quadrant., nondistended.  Bowel sounds are heard.   Extremities: No cyanosis, clubbing or edema.  Peripheral pulses are palpable. Psych: Alert, awake and oriented, normal mood CNS:  No cranial nerve deficits.  Power equal in all extremities.   Skin: Warm and dry.  No rashes noted.  Data Reviewed:   CBC: Recent Labs  Lab 09/08/22 1334 09/09/22 0215 09/10/22 0709 09/11/22 0500 09/12/22 0446 09/13/22 0727 09/13/22 0743 09/14/22 0024  WBC 7.3   < > 6.6 7.3 6.7 8.0  --  8.4  NEUTROABS 6.0  --   --   --   --  6.8  --   --   HGB 12.3   < > 11.6* 12.5 11.5* 12.3 10.9* 11.7*  HCT 37.3   < > 36.1 38.5 35.1* 37.2 32.0* 35.3*  MCV 88.4   < > 92.6 91.2 90.7 89.2  --  87.8  PLT 289   < > 263 269 235 307  --  324   < > = values in this interval not displayed.    Basic Metabolic Panel: Recent Labs  Lab 09/09/22 0215 09/10/22 0709 09/11/22 0500 09/12/22 0446 09/13/22 0727 09/13/22 0743 09/14/22 0024 09/15/22 0208  NA 135   < > 136 133* 132* 135 137 137  K 3.3*   < > 3.6 3.7 4.1 4.1 4.1 4.1  CL 108   < > 105 104 101 106 102 104  CO2 18*   < > 20* 20* 22  --  17* 21*  GLUCOSE 109*   < > 96 104* 109* 93 150* 135*  BUN 24*   < > 19  19 20 19 18  26*  CREATININE 0.86   < > 0.62 0.64 0.69 0.50 0.90 0.76  CALCIUM 8.4*   < > 8.9 8.4* 8.9  --  8.6* 8.9  MG 1.9  --   --  2.0  --   --   --   --   PHOS  --   --   --  4.3  --   --   --   --    < > = values in this interval not displayed.    Liver Function Tests: Recent Labs  Lab 09/11/22 0500 09/12/22 0446 09/13/22 0727 09/14/22 0024 09/15/22 0208  AST 319* 252* 288* 235* 248*  ALT 332* 245* 263* 236* 418*  ALKPHOS 236* 185* 198* 174* 181*  BILITOT 0.9 1.0 0.8 1.0 0.7  PROT 7.5 6.5 7.5 6.8 7.2  ALBUMIN 3.4* 3.0* 3.3* 2.9* 3.1*     Radiology Studies: DG CHEST PORT 1 VIEW  Result Date: 09/13/2022 CLINICAL DATA:  Dyspnea, shortness of breath. EXAM: PORTABLE CHEST 1 VIEW COMPARISON:  Chest radiograph and CT earlier today FINDINGS: Stable low lung volumes. Unchanged heart size and mediastinal contours. Bibasilar atelectasis. Minimal right pleural effusion. No pneumothorax. IMPRESSION: Low lung volumes with bibasilar atelectasis. Left lung base atelectasis has increased from earlier today. Minimal right pleural effusion. Electronically Signed   By: Narda Rutherford M.D.   On: 09/13/2022 18:09      LOS: 2 days    Joycelyn Das, MD [facility] Available via Epic secure chat 7am-7pm After these hours, please refer to coverage provider listed on [email or web address] 09/15/2022, 1:28 PM

## 2022-09-15 NOTE — Progress Notes (Addendum)
Christine Hurley   DOB:[birth year]   ZO#:[id number]   JWJ#:[id number]  Med/onc follow up note   Subjective: Patient is well-known to me, under my care for her recently diagnosed liver cancer.  She started immunotherapy on August 15, 2022.  She was originally hospitalized at [facility] for non-STEMI, cardiac catheterization was unremarkable.  She was readmitted to [facility] yesterday, due to worsening dyspnea on exertion, and was diagnosed arose myocarditis.  She was started on high-dose Solu-Medrol yesterday, has been seen by cardiology service, she is overall feeling better.    Objective:  Vitals:   09/15/22 1200 09/15/22 1400  BP: (!) 144/96 (!) 136/94  Pulse:    Resp:    Temp:    SpO2:      Body mass index is 23.45 kg/m.  Intake/Output Summary (Last 24 hours) at 09/15/2022 1950 Last data filed at 09/15/2022 1451 Gross per 24 hour  Intake 472.68 ml  Output 1250 ml  Net -777.32 ml     Sclerae unicteric  Oropharynx clear  MSK no focal spinal tenderness, no peripheral edema  Neuro nonfocal     CBG (last 3)  Recent Labs    09/15/22 0617 09/15/22 1302 09/15/22 1615  GLUCAP 132* 147* 132*     Labs:   Urine Studies No results for input(s): "UHGB", "CRYS" in the last 72 hours.  Invalid input(s): "UACOL", "UAPR", "USPG", "UPH", "UTP", "UGL", "UKET", "UBIL", "UNIT", "UROB", "ULEU", "UEPI", "UWBC", "URBC", "UBAC", "CAST", "UCOM", "BILUA"  Basic Metabolic Panel: Recent Labs  Lab 09/09/22 0215 09/10/22 0709 09/11/22 0500 09/12/22 0446 09/13/22 0727 09/13/22 0743 09/14/22 0024 09/15/22 0208  NA 135   < > 136 133* 132* 135 137 137  K 3.3*   < > 3.6 3.7 4.1 4.1 4.1 4.1  CL 108   < > 105 104 101 106 102 104  CO2 18*   < > 20* 20* 22  --  17* 21*  GLUCOSE 109*   < > 96 104* 109* 93 150* 135*  BUN 24*   < > 19 19 20 19 18  26*  CREATININE 0.86   < > 0.62 0.64 0.69 0.50 0.90 0.76  CALCIUM 8.4*   < > 8.9 8.4* 8.9  --  8.6* 8.9  MG 1.9  --   --  2.0  --   --   --   --    PHOS  --   --   --  4.3  --   --   --   --    < > = values in this interval not displayed.   GFR Estimated Creatinine Clearance: 59.5 mL/min (by C-G formula based on SCr of 0.76 mg/dL). Liver Function Tests: Recent Labs  Lab 09/11/22 0500 09/12/22 0446 09/13/22 0727 09/14/22 0024 09/15/22 0208  AST 319* 252* 288* 235* 248*  ALT 332* 245* 263* 236* 418*  ALKPHOS 236* 185* 198* 174* 181*  BILITOT 0.9 1.0 0.8 1.0 0.7  PROT 7.5 6.5 7.5 6.8 7.2  ALBUMIN 3.4* 3.0* 3.3* 2.9* 3.1*   No results for input(s): "LIPASE", "AMYLASE" in the last 168 hours. No results for input(s): "AMMONIA" in the last 168 hours. Coagulation profile Recent Labs  Lab 09/10/22 0709 09/13/22 0727 09/14/22 0024  INR 1.1 1.4* 1.3*    CBC: Recent Labs  Lab 09/10/22 0709 09/11/22 0500 09/12/22 0446 09/13/22 0727 09/13/22 0743 09/14/22 0024  WBC 6.6 7.3 6.7 8.0  --  8.4  NEUTROABS  --   --   --  6.8  --   --   HGB 11.6* 12.5 11.5* 12.3 10.9* 11.7*  HCT 36.1 38.5 35.1* 37.2 32.0* 35.3*  MCV 92.6 91.2 90.7 89.2  --  87.8  PLT 263 269 235 307  --  324   Cardiac Enzymes: Recent Labs  Lab 09/13/22 2120 09/14/22 0757 09/15/22 0208  CKTOTAL 2,683* 2,159* 1,401*  CKMB  --  280.6*  --    BNP: Invalid input(s): "POCBNP" CBG: Recent Labs  Lab 09/14/22 1531 09/14/22 2109 09/15/22 0617 09/15/22 1302 09/15/22 1615  GLUCAP 109* 131* 132* 147* 132*   D-Dimer No results for input(s): "DDIMER" in the last 72 hours. Hgb A1c Recent Labs    09/13/22 0727  HGBA1C 6.2*   Lipid Profile Recent Labs    09/13/22 0727  CHOL 159  HDL 53  LDLCALC 87  TRIG 95  CHOLHDL 3.0   Thyroid function studies No results for input(s): "TSH", "T4TOTAL", "T3FREE", "THYROIDAB" in the last 72 hours.  Invalid input(s): "FREET3" Anemia work up No results for input(s): "VITAMINB12", "FOLATE", "FERRITIN", "TIBC", "IRON", "RETICCTPCT" in the last 72 hours. Microbiology Recent Results (from the past 240 hour(s))   SARS Coronavirus 2 by RT PCR (hospital order, performed in [facility] hospital lab) *cepheid single result test* Anterior Nasal Swab     Status: None   Collection Time: 09/08/22  1:34 PM   Specimen: Anterior Nasal Swab  Result Value Ref Range Status   SARS Coronavirus 2 by RT PCR NEGATIVE NEGATIVE Final    Comment: (NOTE) SARS-CoV-2 target nucleic acids are NOT DETECTED.  The SARS-CoV-2 RNA is generally detectable in upper and lower respiratory specimens during the acute phase of infection. The lowest concentration of SARS-CoV-2 viral copies this assay can detect is 250 copies / mL. A negative result does not preclude SARS-CoV-2 infection and should not be used as the sole basis for treatment or other patient management decisions.  A negative result may occur with improper specimen collection / handling, submission of specimen other than nasopharyngeal swab, presence of viral mutation(s) within the areas targeted by this assay, and inadequate number of viral copies (<250 copies / mL). A negative result must be combined with clinical observations, patient history, and epidemiological information.  Fact Sheet for Patients:   RoadLapTop.co.za  Fact Sheet for Healthcare Providers: http://kim-miller.com/  This test is not yet approved or  cleared by the [place] FDA and has been authorized for detection and/or diagnosis of SARS-CoV-2 by FDA under an Emergency Use Authorization (EUA).  This EUA will remain in effect (meaning this test can be used) for the duration of the COVID-19 declaration under Section 564(b)(1) of the Act, 21 U.S.C. section 360bbb-3(b)(1), unless the authorization is terminated or revoked sooner.  Performed at [facility], [address], [place], [place] [address]   C Difficile Quick Screen w PCR reflex     Status: Abnormal   Collection Time: 09/09/22 12:42 PM   Specimen: STOOL  Result  Value Ref Range Status   C Diff antigen POSITIVE (A) NEGATIVE Final   C Diff toxin NEGATIVE NEGATIVE Final   C Diff interpretation Results are indeterminate. See PCR results.  Final    Comment: Performed at [facility], [address] [place], [place] [address]  C. Diff by PCR, Reflexed     Status: None   Collection Time: 09/09/22 12:42 PM  Result Value Ref Range Status   Toxigenic C. Difficile by PCR NEGATIVE NEGATIVE Final  Comment: Patient is colonized with non toxigenic C. difficile. May not need treatment unless significant symptoms are present. Performed at [facility], [address], [place], [place] [address]   Respiratory (~20 pathogens) panel by PCR     Status: None   Collection Time: 09/13/22  3:02 PM   Specimen: Nasopharyngeal Swab; Respiratory  Result Value Ref Range Status   Adenovirus NOT DETECTED NOT DETECTED Final   Coronavirus 229E NOT DETECTED NOT DETECTED Final    Comment: (NOTE) The Coronavirus on the Respiratory Panel, DOES NOT test for the novel  Coronavirus (2019 nCoV)    Coronavirus HKU1 NOT DETECTED NOT DETECTED Final   Coronavirus NL63 NOT DETECTED NOT DETECTED Final   Coronavirus OC43 NOT DETECTED NOT DETECTED Final   Metapneumovirus NOT DETECTED NOT DETECTED Final   Rhinovirus / Enterovirus NOT DETECTED NOT DETECTED Final   Influenza A NOT DETECTED NOT DETECTED Final   Influenza B NOT DETECTED NOT DETECTED Final   Parainfluenza Virus 1 NOT DETECTED NOT DETECTED Final   Parainfluenza Virus 2 NOT DETECTED NOT DETECTED Final   Parainfluenza Virus 3 NOT DETECTED NOT DETECTED Final   Parainfluenza Virus 4 NOT DETECTED NOT DETECTED Final   Respiratory Syncytial Virus NOT DETECTED NOT DETECTED Final   Bordetella pertussis NOT DETECTED NOT DETECTED Final   Bordetella Parapertussis NOT DETECTED NOT DETECTED Final   Chlamydophila pneumoniae NOT DETECTED NOT DETECTED Final   Mycoplasma pneumoniae NOT DETECTED NOT DETECTED Final     Comment: Performed at [facility], [address], [place], [place] [address]  SARS Coronavirus 2 by RT PCR (hospital order, performed in [facility] hospital lab) *cepheid single result test* Anterior Nasal Swab     Status: None   Collection Time: 09/13/22  3:02 PM   Specimen: Anterior Nasal Swab  Result Value Ref Range Status   SARS Coronavirus 2 by RT PCR NEGATIVE NEGATIVE Final    Comment: Performed at [facility], [address], [place], [place] [address]  Culture, blood (Routine X 2) w Reflex to ID Panel     Status: None (Preliminary result)   Collection Time: 09/13/22  9:18 PM   Specimen: BLOOD RIGHT HAND  Result Value Ref Range Status   Specimen Description BLOOD RIGHT HAND  Final   Special Requests   Final    BOTTLES DRAWN AEROBIC ONLY Blood Culture adequate volume   Culture   Final    NO GROWTH 2 DAYS Performed at [facility], [address], [place], [place] [address]    Report Status PENDING  Incomplete  Culture, blood (Routine X 2) w Reflex to ID Panel     Status: None (Preliminary result)   Collection Time: 09/13/22  9:21 PM   Specimen: BLOOD LEFT HAND  Result Value Ref Range Status   Specimen Description BLOOD LEFT HAND  Final   Special Requests   Final    BOTTLES DRAWN AEROBIC AND ANAEROBIC Blood Culture adequate volume   Culture   Final    NO GROWTH 2 DAYS Performed at [facility], [address], [place], [place] [address]    Report Status PENDING  Incomplete      Studies:  MR CARDIAC MORPHOLOGY W WO CONTRAST  Result Date: 09/15/2022 CLINICAL DATA:  Evaluate for myocarditis EXAM: CARDIAC MRI TECHNIQUE: The patient was scanned on a 1.5 Tesla Siemens magnet. A dedicated cardiac coil was used. Functional imaging was done using Fiesta sequences. 2,3, and  4 chamber views were done to assess for RWMA's. Modified Simpson's rule using a short axis stack was used to calculate an ejection fraction on a dedicated work Chief Technology Officer. The patient received 6 cc of Gadavist. After 10 minutes inversion recovery sequences were used to assess for infiltration and scar tissue. Phase contrast velocity mapping was performed above the aortic and pulmonic valves CONTRAST:  6 cc  of Gadavist FINDINGS: Left ventricle: -Normal size -Normal wall thickness -Moderate systolic dysfunction -Elevated ECV (35%) -Elevated myocardial T2 values throughout mid and apical slices -Patchy midwall LGE in mid to apical septum -RV insertion site LGE LV EF:  35% (Normal 52-79%) Absolute volumes: LV EDV: (Normal 78-167 mL) LV ESV: 76mL (Normal 21-64 mL) LV SV: 42mL (Normal 52-114 mL) CO: 4.5L/min (Normal 2.7-6.3 L/min) Indexed volumes: LV EDV: 38mL/sq-m (Normal 50-96 mL/sq-m) LV ESV: 45mL/sq-m (Normal 10-40 mL/sq-m) LV SV: 68mL/sq-m (Normal 33-64 mL/sq-m) CI: 2.6L/min/sq-m (Normal 1.9-3.9 L/min/sq-m) Right ventricle: Normal size with moderate systolic dysfunction RV EF: 30% (Normal 52-80%) Absolute volumes: RV EDV: (Normal 79-175 mL) RV ESV: (Normal 13-75 mL) RV SV: 45mL (Normal 56-110 mL) CO: 4.8L/min (Normal 2.7-6 L/min) Indexed volumes: RV EDV: 10mL/sq-m (Normal 51-97 mL/sq-m) RV ESV: 40mL/sq-m (Normal 9-42 mL/sq-m) RV SV: 40mL/sq-m (Normal 35-61 mL/sq-m) CI: 2.8L/min/sq-m (Normal 1.8-3.8 L/min/sq-m) Left atrium: Mild enlargement Right atrium: Normal size Mitral valve: Trivial regurgitation Aortic valve: Trivial regurgitation Tricuspid valve: Trivial regurgitation Pulmonic valve: Trivial regurgitation Aorta: Dilated ascending aorta measuring 39mm Pericardium: Small effusion Extracardiac structures: Small to moderate right pleural effusion IMPRESSION: 1. Meets criteria for acute myocarditis, with regional elevation in myocardial T2 values and patchy midwall LGE in mid to apical septum. 2. RV insertion site LGE, which is a nonspecific scar pattern often seen in setting of elevated pulmonary pressures 3. Normal LV size, normal wall thickness, and  moderate systolic dysfunction (EF 35%) 4.  Normal RV size with moderate systolic dysfunction (EF 30%) Electronically Signed   By: Epifanio Lesches M.D.   On: 09/15/2022 17:40   MR CARDIAC VELOCITY FLOW MAP  Result Date: 09/15/2022 CLINICAL DATA:  Evaluate for myocarditis EXAM: CARDIAC MRI TECHNIQUE: The patient was scanned on a 1.5 Tesla Siemens magnet. A dedicated cardiac coil was used. Functional imaging was done using Fiesta sequences. 2,3, and 4 chamber views were done to assess for RWMA's. Modified Simpson's rule using a short axis stack was used to calculate an ejection fraction on a dedicated work Research officer, trade [employer]. The patient received 6 cc of Gadavist. After 10 minutes inversion recovery sequences were used to assess for infiltration and scar tissue. Phase contrast velocity mapping was performed above the aortic and pulmonic valves CONTRAST:  6 cc  of Gadavist FINDINGS: Left ventricle: -Normal size -Normal wall thickness -Moderate systolic dysfunction -Elevated ECV (35%) -Elevated myocardial T2 values throughout mid and apical slices -Patchy midwall LGE in mid to apical septum -RV insertion site LGE LV EF:  35% (Normal 52-79%) Absolute volumes: LV EDV: (Normal 78-167 mL) LV ESV: 76mL (Normal 21-64 mL) LV SV: 42mL (Normal 52-114 mL) CO: 4.5L/min (Normal 2.7-6.3 L/min) Indexed volumes: LV EDV: 2mL/sq-m (Normal 50-96 mL/sq-m) LV ESV: 38mL/sq-m (Normal 10-40 mL/sq-m) LV SV: 65mL/sq-m (Normal 33-64 mL/sq-m) CI: 2.6L/min/sq-m (Normal 1.9-3.9 L/min/sq-m) Right ventricle: Normal size with moderate systolic dysfunction RV EF: 30% (Normal 52-80%) Absolute volumes: RV EDV: (Normal 79-175 mL) RV ESV: (Normal 13-75 mL) RV SV: 45mL (Normal 56-110 mL) CO: 4.8L/min (Normal 2.7-6 L/min) Indexed volumes:  RV EDV: 58mL/sq-m (Normal 51-97 mL/sq-m) RV ESV: 70mL/sq-m (Normal 9-42 mL/sq-m) RV SV: 10mL/sq-m (Normal 35-61 mL/sq-m) CI: 2.8L/min/sq-m (Normal 1.8-3.8 L/min/sq-m) Left atrium: Mild  enlargement Right atrium: Normal size Mitral valve: Trivial regurgitation Aortic valve: Trivial regurgitation Tricuspid valve: Trivial regurgitation Pulmonic valve: Trivial regurgitation Aorta: Dilated ascending aorta measuring 39mm Pericardium: Small effusion Extracardiac structures: Small to moderate right pleural effusion IMPRESSION: 1. Meets criteria for acute myocarditis, with regional elevation in myocardial T2 values and patchy midwall LGE in mid to apical septum. 2. RV insertion site LGE, which is a nonspecific scar pattern often seen in setting of elevated pulmonary pressures 3. Normal LV size, normal wall thickness, and moderate systolic dysfunction (EF 35%) 4.  Normal RV size with moderate systolic dysfunction (EF 30%) Electronically Signed   By: Epifanio Lesches M.D.   On: 09/15/2022 17:40   MR CARDIAC VELOCITY FLOW MAP  Result Date: 09/15/2022 CLINICAL DATA:  Evaluate for myocarditis EXAM: CARDIAC MRI TECHNIQUE: The patient was scanned on a 1.5 Tesla Siemens magnet. A dedicated cardiac coil was used. Functional imaging was done using Fiesta sequences. 2,3, and 4 chamber views were done to assess for RWMA's. Modified Simpson's rule using a short axis stack was used to calculate an ejection fraction on a dedicated work Research officer, trade union. The patient received 6 cc of Gadavist. After 10 minutes inversion recovery sequences were used to assess for infiltration and scar tissue. Phase contrast velocity mapping was performed above the aortic and pulmonic valves CONTRAST:  6 cc  of Gadavist FINDINGS: Left ventricle: -Normal size -Normal wall thickness -Moderate systolic dysfunction -Elevated ECV (35%) -Elevated myocardial T2 values throughout mid and apical slices -Patchy midwall LGE in mid to apical septum -RV insertion site LGE LV EF:  35% (Normal 52-79%) Absolute volumes: LV EDV: (Normal 78-167 mL) LV ESV: 76mL (Normal 21-64 mL) LV SV: 42mL (Normal 52-114 mL) CO: 4.5L/min (Normal  2.7-6.3 L/min) Indexed volumes: LV EDV: 55mL/sq-m (Normal 50-96 mL/sq-m) LV ESV: 82mL/sq-m (Normal 10-40 mL/sq-m) LV SV: 77mL/sq-m (Normal 33-64 mL/sq-m) CI: 2.6L/min/sq-m (Normal 1.9-3.9 L/min/sq-m) Right ventricle: Normal size with moderate systolic dysfunction RV EF: 30% (Normal 52-80%) Absolute volumes: RV EDV: (Normal 79-175 mL) RV ESV: (Normal 13-75 mL) RV SV: 45mL (Normal 56-110 mL) CO: 4.8L/min (Normal 2.7-6 L/min) Indexed volumes: RV EDV: 40mL/sq-m (Normal 51-97 mL/sq-m) RV ESV: 60mL/sq-m (Normal 9-42 mL/sq-m) RV SV: 6mL/sq-m (Normal 35-61 mL/sq-m) CI: 2.8L/min/sq-m (Normal 1.8-3.8 L/min/sq-m) Left atrium: Mild enlargement Right atrium: Normal size Mitral valve: Trivial regurgitation Aortic valve: Trivial regurgitation Tricuspid valve: Trivial regurgitation Pulmonic valve: Trivial regurgitation Aorta: Dilated ascending aorta measuring 39mm Pericardium: Small effusion Extracardiac structures: Small to moderate right pleural effusion IMPRESSION: 1. Meets criteria for acute myocarditis, with regional elevation in myocardial T2 values and patchy midwall LGE in mid to apical septum. 2. RV insertion site LGE, which is a nonspecific scar pattern often seen in setting of elevated pulmonary pressures 3. Normal LV size, normal wall thickness, and moderate systolic dysfunction (EF 35%) 4.  Normal RV size with moderate systolic dysfunction (EF 30%) Electronically Signed   By: Epifanio Lesches M.D.   On: 09/15/2022 17:40    Assessment: 73 y.o. female   Myocarditis, probably related to recent immunotherapy. Acute diastolic heart failure Unresectable hepatocellular carcinoma, status post 1 cycle immunotherapy durvalumab and tremelimymab on 08/15/22 Portal vein thrombosis Chronic hepatitis C infection    Plan:  -I agree with cardiology, that this is likely immunotherapy induced myocarditis.  Her cardiac MRI today  confirmed acute myocarditis.  I spoke with cardiologist Dr. Bjorn Pippin this morning,  at the etiology of myocarditis, such as viral myocarditis, felt to be much less likely. -I agree with steroids, she has been on high-dose of Solu-Medrol, okay to transition to oral prednisone 60 mg in 1-2 days, will taper to 50mg  and 40mg  on week 2 and 3, then stay on 40mg  for about 3-4 weeks then taper off in 3 weeks. I will work with her cardiologist to manager her steroid tapering.  -Further immunotherapy would be contraindicated due to the severe side effects.  I will recommend second line TKI such as Lenvima, when she recovers well from myocarditis in 1-2 months.  -I appreciate the excellent care from cardiology service and hospitalist team, I will f/u as needed while she is in hospital, and see her back in 1-2 week after discharge.  Malachy Mood MD 09/15/2022

## 2022-09-15 NOTE — Progress Notes (Signed)
CRITICAL VALUE STICKER  CRITICAL VALUE: EKG STEMI   RECEIVER (on-site recipient of call): Elita Quick, RN   DATE & TIME NOTIFIED: 09/15/22 @ 1205  MD NOTIFIED: Dr. Bjorn Pippin  TIME OF NOTIFICATION: 1206  RESPONSE:  EKG similar to previous EKG - abnormal result suspected to be related to Myocarditis. Patient asymptomatic, will continue to monitor.

## 2022-09-16 ENCOUNTER — Other Ambulatory Visit: Payer: Self-pay

## 2022-09-16 ENCOUNTER — Inpatient Hospital Stay (HOSPITAL_COMMUNITY): Payer: Medicare HMO

## 2022-09-16 DIAGNOSIS — I5041 Acute combined systolic (congestive) and diastolic (congestive) heart failure: Secondary | ICD-10-CM

## 2022-09-16 DIAGNOSIS — I408 Other acute myocarditis: Secondary | ICD-10-CM | POA: Diagnosis not present

## 2022-09-16 DIAGNOSIS — C220 Liver cell carcinoma: Secondary | ICD-10-CM | POA: Diagnosis not present

## 2022-09-16 DIAGNOSIS — E8779 Other fluid overload: Secondary | ICD-10-CM

## 2022-09-16 DIAGNOSIS — B182 Chronic viral hepatitis C: Secondary | ICD-10-CM

## 2022-09-16 DIAGNOSIS — I81 Portal vein thrombosis: Secondary | ICD-10-CM | POA: Diagnosis not present

## 2022-09-16 DIAGNOSIS — F17219 Nicotine dependence, cigarettes, with unspecified nicotine-induced disorders: Secondary | ICD-10-CM

## 2022-09-16 LAB — GLUCOSE, CAPILLARY
Glucose-Capillary: 191 mg/dL — ABNORMAL HIGH (ref 70–99)
Glucose-Capillary: 119 mg/dL — ABNORMAL HIGH (ref 70–99)
Glucose-Capillary: 132 mg/dL — ABNORMAL HIGH (ref 70–99)
Glucose-Capillary: 139 mg/dL — ABNORMAL HIGH (ref 70–99)

## 2022-09-16 LAB — TROPONIN I (HIGH SENSITIVITY): Troponin I (High Sensitivity): 331 ng/L (ref <18)

## 2022-09-16 MED ORDER — POTASSIUM CHLORIDE CRYS ER 20 MEQ PO TBCR
40.0000 meq | EXTENDED_RELEASE_TABLET | Freq: Once | ORAL | Status: AC
  Administered 2022-09-16: 40 meq via ORAL
  Filled 2022-09-16: qty 2

## 2022-09-16 MED ORDER — LOSARTAN POTASSIUM 25 MG PO TABS
25.0000 mg | ORAL_TABLET | Freq: Every day | ORAL | Status: DC
  Administered 2022-09-16 – 2022-09-20 (×5): 25 mg via ORAL
  Filled 2022-09-16 (×5): qty 1

## 2022-09-16 MED ORDER — LORAZEPAM 0.5 MG PO TABS
0.5000 mg | ORAL_TABLET | Freq: Every evening | ORAL | Status: DC | PRN
  Administered 2022-09-16 – 2022-09-19 (×4): 0.5 mg via ORAL
  Filled 2022-09-16 (×4): qty 1

## 2022-09-16 MED ORDER — POTASSIUM CHLORIDE CRYS ER 20 MEQ PO TBCR: 20.0000 meq | EXTENDED_RELEASE_TABLET | Freq: Once | ORAL | Status: DC

## 2022-09-16 MED ORDER — LORAZEPAM 2 MG/ML IJ SOLN
1.0000 mg | Freq: Once | INTRAMUSCULAR | Status: AC | PRN
  Administered 2022-09-16: 1 mg via INTRAVENOUS
  Filled 2022-09-16: qty 1

## 2022-09-16 NOTE — TOC Initial Note (Addendum)
Transition of Care [facility]) - Initial/Assessment Note    Patient Details  Name: Christine Hurley MRN: [id number] Date of Birth: 1950/05/10  Transition of Care [facility]) CM/SW Contact:    Leone Haven, RN Phone Number: 09/16/2022, 8:19 AM  Clinical Narrative:                 Risk for Readmit completed.  From home with her fiance, she has PCP, Dr. Mardelle Matte, with follow up on AVS, she has insurance on file.  She states she currently does not have any HH services in place at this time and states that she does not need any HH services.  This NCM asked if could set up a HHRN for her she states no, she does not need one.  She has a walker that she uses when she ambulates. She states she may need home oxygen at dc, and if so she does not have a preference of the agency.   She states her fiance will transport her home at dc, she [place] [address] [place] [address] [place]   Expected Discharge Plan: Home/Self Care Barriers to Discharge: Continued Medical Work up   Patient Goals and CMS Choice Patient states their goals for this hospitalization and ongoing recovery are:: go home   Choice offered to / list presented to : NA      Expected Discharge Plan and Services In-house Referral: NA Discharge Planning Services: CM Consult Post Acute Care Choice: NA Living arrangements for the past 2 months: Single Family Home                 DME Arranged: N/A DME Agency: NA       HH Arranged: NA          Prior Living Arrangements/Services Living arrangements for the past 2 months: Single Family Home Lives with:: Significant Other (fiance) Patient language and need for interpreter reviewed:: Yes Do you feel safe going back to the place where you live?: Yes      Need for Family Participation in Patient Care: Yes (Comment) Care giver support system in place?: Yes (comment) Current home services: DME (walker) Criminal Activity/Legal Involvement Pertinent to Current  Situation/Hospitalization: No - Comment as needed  Activities of Daily Living      Permission Sought/Granted Permission sought to share information with : Case Manager Permission granted to share information with : Yes, Verbal Permission Granted              Emotional Assessment   Attitude/Demeanor/Rapport: Engaged Affect (typically observed): Appropriate Orientation: : Oriented to Self, Oriented to Place, Oriented to  Time, Oriented to Situation Alcohol / Substance Use: Alcohol Use, Illicit Drugs Psych Involvement: No (comment)  Admission diagnosis:  Myocarditis (HCC) [I51.4] Chest pain, unspecified type [R07.9] Patient Active Problem List   Diagnosis Date Noted   Myocarditis (HCC) 09/13/2022   Fluid overload 09/13/2022   Demand ischemia 09/11/2022   CAD in native artery 09/11/2022   Pure hypercholesterolemia 09/11/2022   Tachycardia 09/09/2022   NSTEMI (non-ST elevated myocardial infarction) (HCC) 09/08/2022   Hypokalemia 09/08/2022   Hepatocellular carcinoma (HCC) 07/24/2022   Colitis 07/19/2022   Liver lesion 07/19/2022   Portal vein thrombosis 07/19/2022   PAD (peripheral artery disease) (HCC) 10/01/2021   Intermittent claudication (HCC) 09/19/2021   Mixed hyperlipidemia 01/17/2019   Marijuana use, episodic 06/18/2017   Cigarette nicotine dependence with nicotine-induced disorder 11/03/2016   Osteopenia of neck of femur 05/22/2016   Hypertensive urgency 11/22/2015  Abnormal thallium stress test 03/07/2015   Chronic obstructive pulmonary disease (HCC) 07/01/2013   Essential hypertension 05/26/2013   Chronic hepatitis C without hepatic coma (HCC) 11/04/2006   PCP:  Willow Ora, MD Pharmacy:   [facility] [address] - [place], [place] - [address] [facility] [place] [address] Phone: [phone] Fax: [phone]  [id number] [place], [place] - [address] AT [facility] [address] [facility] [address]  [place] [address] Phone: [phone] Fax: [phone]     Social Determinants of Health (SDOH) Social History: SDOH Screenings   Food Insecurity: No Food Insecurity (09/08/2022)  Housing: Low Risk  (09/08/2022)  Transportation Needs: No Transportation Needs (09/08/2022)  Utilities: Not At Risk (09/08/2022)  Depression (PHQ2-9): Low Risk  (07/28/2022)  Financial Resource Strain: Low Risk  (03/13/2022)  Physical Activity: Inactive (03/13/2022)  Social Connections: Moderately Isolated (03/13/2022)  Stress: No Stress Concern Present (03/13/2022)  Tobacco Use: High Risk (09/13/2022)   SDOH Interventions:     Readmission Risk Interventions    09/16/2022    8:11 AM  Readmission Risk Prevention Plan  Transportation Screening Complete  PCP or Specialist Appt within 3-5 Days Complete  HRI or Home Care Consult Complete  Social Work Consult for Recovery Care Planning/Counseling --  Palliative Care Screening Not Applicable  Medication Review Oceanographer) Complete

## 2022-09-16 NOTE — Progress Notes (Signed)
PROGRESS NOTE    Christine Hurley  IHK:[id number] DOB: Dec 04, 1950 DOA: 09/13/2022 PCP: Willow Ora, MD    Brief Narrative:   Christine Hurley is a 72 y.o. female with medical history significant of hepatocellular carcinoma-liver mass (s/p 1 treatment with tremelimumab and durvalumab on 7/5/20240 who was discharged from [facility] one day piror to presentation for suspected non-ST elevation MI.  She did have angiogram done and there was no findings for severe CAD so no intervention was done.  After she went home she again complained of chest pain with exertional dyspnea followed by progressive shortness of breath to the extent of shortness of breath at rest.  In the ED, workup showed worsening troponin, CK  as well as new EKG changes.  Cardiology and patient's primary oncologist has seen the patient and have determined the patient may have pericarditis/ myocarditis and patient has been started on IV steroid, oxygen supplementation and IV diuretics.  Cardiology actively following.  Still pending clinically improvement.  Assessment and Plan:  * Myocarditis (HCC)/myopericarditis Still complains of shortness of breath and had the episode of anxiety and shortness of breath in the nighttime. History of non-STEMI S/P cardiac cath 09/10/2022.  COVID-19 and respiratory viral panel negative. 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  Patient has received Solu-Medrol 1000 mg on 09/13/2022, and he is currently on 125 mg IV twice daily.  Plan is to continue steroid for 4 to 6 weeks on discharge.  Continue colchicine for likely transition to oral prednisone soon.  Cardiology on board and underwent  MRI of the heart on 09/15/2022 with findings of myocarditis.  Troponins are downtrending.  Cardiology following.     Fluid overload Mild pleural effusion on CT scan as well as a small amount of simple fluid attenuation ascites throughout abdomen and pelvis.  Currently on Lasix 40  mg IV twice daily.   Question pneumonia so was started on Rocephin.  CRP was 0.7.  Sed rate was elevated at 48.  Procalcitonin at 0.8.  Patient is negative balance for 2263 mL. Had 1400 mL urine output in last 24 hours.  Hepatocellular carcinoma IIA  Durvalumab and tremelimumab have been reported to cause myocarditis and pericarditis .  Continue to hold.  Oncology on board.  Initially received 1 g of Solu-Medrol followed by 125 milligram twice daily.   Portal vein thrombosis Diagnosed in June 2024.  Was initially on heparin drip which has been changed to Eliquis.   Nicotine use disorder.  On nicotine patch.  Chronic hepatitis C without hepatic coma (HCC) Continue to monitor LFTs.    DVT prophylaxis: SCDs Start: 09/13/22 1142 apixaban (ELIQUIS) tablet 5 mg   Code Status:     Code Status: Full Code  Disposition: Uncertain at this time.  Likely home in 2 to 3 days.  Status is: Inpatient  Remains inpatient appropriate because: IV high-dose Solu-Medrol, IV diuretics, pending clinical improvement.   Family Communication: Spoke with the patient's son at bedside on 09/15/2022.[name]  Consultants:  Cardiology Oncology  Procedures:  None   Antimicrobials:  Cardiac MRI  Anti-infectives (From admission, onward)    Start     Dose/Rate Route Frequency Ordered Stop   09/13/22 2130  cefTRIAXone (ROCEPHIN) 1 g in sodium chloride 0.9 % 100 mL IVPB        1 g 200 mL/hr over 30 Minutes Intravenous Every 24 hours 09/13/22 2044     09/13/22 2130  azithromycin (ZITHROMAX) 500 mg in sodium chloride 0.9 %  250 mL IVPB        500 mg 250 mL/hr over 60 Minutes Intravenous Every 24 hours 09/13/22 2044 09/16/22 0006       Subjective: Today, patient was seen and examined at bedside.  Had a spell of shortness of breath and anxiety yesterday.  Repeat chest x-ray was done.  Ativan was given x 1.  Complains of diarrhea as well.  Denies any chest pain.  Objective: Vitals:   09/16/22 0509 09/16/22 0747  09/16/22 0759 09/16/22 1110  BP: (!) 140/89 (!) 145/94  (!) 147/88  Pulse: 97 99  96  Resp: 20 18  18   Temp: 98.2 F (36.8 C) 97.8 F (36.6 C)  97.8 F (36.6 C)  TempSrc: Oral Oral  Oral  SpO2: 100% 94% 95% 98%  Weight:      Height:        Intake/Output Summary (Last 24 hours) at 09/16/2022 1146 Last data filed at 09/16/2022 0503 Gross per 24 hour  Intake --  Output 1150 ml  Net -1150 ml   Filed Weights   09/13/22 1651 09/15/22 0600 09/16/22 0503  Weight: 66.1 kg 65.9 kg 63 kg    Physical Examination: Body mass index is 22.42 kg/m.   General:  Average built, not in obvious distress, on nasal cannula oxygen at 3 L/min.   HENT:   No scleral pallor or icterus noted. Oral mucosa is moist.  Distended neck vein. Chest:  .  Diminished breath sounds bilaterally.   CVS: S1 &S2 heard. No murmur.  Regular rate and rhythm. Abdomen: Soft, mild tenderness over the right upper quadrant., nondistended.  Bowel sounds are heard.   Extremities: No cyanosis, clubbing or edema.  Peripheral pulses are palpable. Psych: Alert, awake and oriented, normal mood CNS:  No cranial nerve deficits.  Power equal in all extremities.   Skin: Warm and dry.  No rashes noted.  Data Reviewed:   CBC: Recent Labs  Lab 09/11/22 0500 09/12/22 0446 09/13/22 0727 09/13/22 0743 09/14/22 0024 09/16/22 0056  WBC 7.3 6.7 8.0  --  8.4 11.5*  NEUTROABS  --   --  6.8  --   --   --   HGB 12.5 11.5* 12.3 10.9* 11.7* 11.1*  HCT 38.5 35.1* 37.2 32.0* 35.3* 34.2*  MCV 91.2 90.7 89.2  --  87.8 89.3  PLT 269 235 307  --  324 333    Basic Metabolic Panel: Recent Labs  Lab 09/12/22 0446 09/13/22 0727 09/13/22 0743 09/14/22 0024 09/15/22 0208 09/16/22 0056  NA 133* 132* 135 137 137 138  K 3.7 4.1 4.1 4.1 4.1 3.7  CL 104 101 106 102 104 101  CO2 20* 22  --  17* 21* 22  GLUCOSE 104* 109* 93 150* 135* 137*  BUN 19 20 19 18  26* 33*  CREATININE 0.64 0.69 0.50 0.90 0.76 0.88  CALCIUM 8.4* 8.9  --  8.6* 8.9 9.0   MG 2.0  --   --   --   --  2.3  PHOS 4.3  --   --   --   --   --     Liver Function Tests: Recent Labs  Lab 09/12/22 0446 09/13/22 0727 09/14/22 0024 09/15/22 0208 09/16/22 0056  AST 252* 288* 235* 248* 199*  ALT 245* 263* 236* 418* 383*  ALKPHOS 185* 198* 174* 181* 224*  BILITOT 1.0 0.8 1.0 0.7 0.5  PROT 6.5 7.5 6.8 7.2 6.6  ALBUMIN 3.0* 3.3* 2.9* 3.1* 3.0*  Radiology Studies: DG CHEST PORT 1 VIEW  Result Date: 09/16/2022 CLINICAL DATA:  [id number] with shortness of breath. EXAM: PORTABLE CHEST 1 VIEW COMPARISON:  Portable chest and CTA chest both 09/13/2022. FINDINGS: The lungs expiratory with consolidation or atelectasis in the hypoinflated lower lung zones and small right pleural effusion with minimal left pleural fluid. The lungs are otherwise clear as far as visualized. The aorta is tortuous and calcified with stable mediastinum. There is mild cardiomegaly without CHF pattern. Degenerative change thoracic spine and slight dextroscoliosis. Comparison to the prior study reveals no significant change in overall aeration. IMPRESSION: 1. Expiratory chest x-ray with consolidation or atelectasis in the hypoinflated lower lung zones and small right pleural effusion with minimal left pleural fluid. 2. Mild cardiomegaly. Electronically Signed   By: Almira Bar M.D.   On: 09/16/2022 00:50   MR CARDIAC MORPHOLOGY W WO CONTRAST  Result Date: 09/15/2022 CLINICAL DATA:  Evaluate for myocarditis EXAM: CARDIAC MRI TECHNIQUE: The patient was scanned on a 1.5 Tesla Siemens magnet. A dedicated cardiac coil was used. Functional imaging was done using Fiesta sequences. 2,3, and 4 chamber views were done to assess for RWMA's. Modified Simpson's rule using a short axis stack was used to calculate an ejection fraction on a dedicated work [employer], [employer]. The patient received 6 cc of Gadavist. After 10 minutes inversion recovery sequences were used to assess for infiltration and scar tissue.  Phase contrast velocity mapping was performed above the aortic and pulmonic valves CONTRAST:  6 cc  of Gadavist FINDINGS: Left ventricle: -Normal size -Normal wall thickness -Moderate systolic dysfunction -Elevated ECV (35%) -Elevated myocardial T2 values throughout mid and apical slices -Patchy midwall LGE in mid to apical septum -RV insertion site LGE LV EF:  35% (Normal 52-79%) Absolute volumes: LV EDV: (Normal 78-167 mL) LV ESV: 76mL (Normal 21-64 mL) LV SV: 42mL (Normal 52-114 mL) CO: 4.5L/min (Normal 2.7-6.3 L/min) Indexed volumes: LV EDV: 42mL/sq-m (Normal 50-96 mL/sq-m) LV ESV: 52mL/sq-m (Normal 10-40 mL/sq-m) LV SV: 65mL/sq-m (Normal 33-64 mL/sq-m) CI: 2.6L/min/sq-m (Normal 1.9-3.9 L/min/sq-m) Right ventricle: Normal size with moderate systolic dysfunction RV EF: 30% (Normal 52-80%) Absolute volumes: RV EDV: (Normal 79-175 mL) RV ESV: (Normal 13-75 mL) RV SV: 45mL (Normal 56-110 mL) CO: 4.8L/min (Normal 2.7-6 L/min) Indexed volumes: RV EDV: 77mL/sq-m (Normal 51-97 mL/sq-m) RV ESV: 60mL/sq-m (Normal 9-42 mL/sq-m) RV SV: 95mL/sq-m (Normal 35-61 mL/sq-m) CI: 2.8L/min/sq-m (Normal 1.8-3.8 L/min/sq-m) Left atrium: Mild enlargement Right atrium: Normal size Mitral valve: Trivial regurgitation Aortic valve: Trivial regurgitation Tricuspid valve: Trivial regurgitation Pulmonic valve: Trivial regurgitation Aorta: Dilated ascending aorta measuring 39mm Pericardium: Small effusion Extracardiac structures: Small to moderate right pleural effusion IMPRESSION: 1. Meets criteria for acute myocarditis, with regional elevation in myocardial T2 values and patchy midwall LGE in mid to apical septum. 2. RV insertion site LGE, which is a nonspecific scar pattern often seen in setting of elevated pulmonary pressures 3. Normal LV size, normal wall thickness, and moderate systolic dysfunction (EF 35%) 4.  Normal RV size with moderate systolic dysfunction (EF 30%) Electronically Signed   By: Epifanio Lesches  M.D.   On: 09/15/2022 17:40   MR CARDIAC VELOCITY FLOW MAP  Result Date: 09/15/2022 CLINICAL DATA:  Evaluate for myocarditis EXAM: CARDIAC MRI TECHNIQUE: The patient was scanned on a 1.5 Tesla Siemens magnet. A dedicated cardiac coil was used. Functional imaging was done using Fiesta sequences. 2,3, and 4 chamber views were done to assess for RWMA's. Modified Simpson's rule using a  short axis stack was used to calculate an ejection fraction on a dedicated work [employer] officer, [employer]. The patient received 6 cc of Gadavist. After 10 minutes inversion recovery sequences were used to assess for infiltration and scar tissue. Phase contrast velocity mapping was performed above the aortic and pulmonic valves CONTRAST:  6 cc  of Gadavist FINDINGS: Left ventricle: -Normal size -Normal wall thickness -Moderate systolic dysfunction -Elevated ECV (35%) -Elevated myocardial T2 values throughout mid and apical slices -Patchy midwall LGE in mid to apical septum -RV insertion site LGE LV EF:  35% (Normal 52-79%) Absolute volumes: LV EDV: (Normal 78-167 mL) LV ESV: 76mL (Normal 21-64 mL) LV SV: 42mL (Normal 52-114 mL) CO: 4.5L/min (Normal 2.7-6.3 L/min) Indexed volumes: LV EDV: 34mL/sq-m (Normal 50-96 mL/sq-m) LV ESV: 27mL/sq-m (Normal 10-40 mL/sq-m) LV SV: 41mL/sq-m (Normal 33-64 mL/sq-m) CI: 2.6L/min/sq-m (Normal 1.9-3.9 L/min/sq-m) Right ventricle: Normal size with moderate systolic dysfunction RV EF: 30% (Normal 52-80%) Absolute volumes: RV EDV: (Normal 79-175 mL) RV ESV: (Normal 13-75 mL) RV SV: 45mL (Normal 56-110 mL) CO: 4.8L/min (Normal 2.7-6 L/min) Indexed volumes: RV EDV: 83mL/sq-m (Normal 51-97 mL/sq-m) RV ESV: 64mL/sq-m (Normal 9-42 mL/sq-m) RV SV: 33mL/sq-m (Normal 35-61 mL/sq-m) CI: 2.8L/min/sq-m (Normal 1.8-3.8 L/min/sq-m) Left atrium: Mild enlargement Right atrium: Normal size Mitral valve: Trivial regurgitation Aortic valve: Trivial regurgitation Tricuspid valve: Trivial regurgitation  Pulmonic valve: Trivial regurgitation Aorta: Dilated ascending aorta measuring 39mm Pericardium: Small effusion Extracardiac structures: Small to moderate right pleural effusion IMPRESSION: 1. Meets criteria for acute myocarditis, with regional elevation in myocardial T2 values and patchy midwall LGE in mid to apical septum. 2. RV insertion site LGE, which is a nonspecific scar pattern often seen in setting of elevated pulmonary pressures 3. Normal LV size, normal wall thickness, and moderate systolic dysfunction (EF 35%) 4.  Normal RV size with moderate systolic dysfunction (EF 30%) Electronically Signed   By: Epifanio Lesches M.D.   On: 09/15/2022 17:40   MR CARDIAC VELOCITY FLOW MAP  Result Date: 09/15/2022 CLINICAL DATA:  Evaluate for myocarditis EXAM: CARDIAC MRI TECHNIQUE: The patient was scanned on a 1.5 Tesla Siemens magnet. A dedicated cardiac coil was used. Functional imaging was done using Fiesta sequences. 2,3, and 4 chamber views were done to assess for RWMA's. Modified Simpson's rule using a short axis stack was used to calculate an ejection fraction on a dedicated work [employer] officer, [employer]. The patient received 6 cc of Gadavist. After 10 minutes inversion recovery sequences were used to assess for infiltration and scar tissue. Phase contrast velocity mapping was performed above the aortic and pulmonic valves CONTRAST:  6 cc  of Gadavist FINDINGS: Left ventricle: -Normal size -Normal wall thickness -Moderate systolic dysfunction -Elevated ECV (35%) -Elevated myocardial T2 values throughout mid and apical slices -Patchy midwall LGE in mid to apical septum -RV insertion site LGE LV EF:  35% (Normal 52-79%) Absolute volumes: LV EDV: (Normal 78-167 mL) LV ESV: 76mL (Normal 21-64 mL) LV SV: 42mL (Normal 52-114 mL) CO: 4.5L/min (Normal 2.7-6.3 L/min) Indexed volumes: LV EDV: 22mL/sq-m (Normal 50-96 mL/sq-m) LV ESV: 79mL/sq-m (Normal 10-40 mL/sq-m) LV SV: 93mL/sq-m (Normal 33-64  mL/sq-m) CI: 2.6L/min/sq-m (Normal 1.9-3.9 L/min/sq-m) Right ventricle: Normal size with moderate systolic dysfunction RV EF: 30% (Normal 52-80%) Absolute volumes: RV EDV: (Normal 79-175 mL) RV ESV: (Normal 13-75 mL) RV SV: 45mL (Normal 56-110 mL) CO: 4.8L/min (Normal 2.7-6 L/min) Indexed volumes: RV EDV: 62mL/sq-m (Normal 51-97 mL/sq-m) RV ESV: 65mL/sq-m (Normal 9-42 mL/sq-m) RV SV:  66mL/sq-m (Normal 35-61 mL/sq-m) CI: 2.8L/min/sq-m (Normal 1.8-3.8 L/min/sq-m) Left atrium: Mild enlargement Right atrium: Normal size Mitral valve: Trivial regurgitation Aortic valve: Trivial regurgitation Tricuspid valve: Trivial regurgitation Pulmonic valve: Trivial regurgitation Aorta: Dilated ascending aorta measuring 39mm Pericardium: Small effusion Extracardiac structures: Small to moderate right pleural effusion IMPRESSION: 1. Meets criteria for acute myocarditis, with regional elevation in myocardial T2 values and patchy midwall LGE in mid to apical septum. 2. RV insertion site LGE, which is a nonspecific scar pattern often seen in setting of elevated pulmonary pressures 3. Normal LV size, normal wall thickness, and moderate systolic dysfunction (EF 35%) 4.  Normal RV size with moderate systolic dysfunction (EF 30%) Electronically Signed   By: Epifanio Lesches M.D.   On: 09/15/2022 17:40      LOS: 3 days    Joycelyn Das, MD [facility] Available via Epic secure chat 7am-7pm After these hours, please refer to coverage provider listed on [email or web address] 09/16/2022, 11:46 AM

## 2022-09-16 NOTE — Care Management Important Message (Signed)
Important Message  Patient Details  Name: Christine Hurley MRN: [id number] Date of Birth: 07/09/1950   Medicare Important Message Given:  Yes     Renie Ora 09/16/2022, 8:55 AM

## 2022-09-16 NOTE — Progress Notes (Signed)
Progress Note  Patient Name: Christine Hurley Date of Encounter: 09/16/2022  Primary Cardiologist: Chilton Si, MD   Subjective   Net -1.4 L yesterday, net -2.3 L on admission.  Creatinine stable at 0.88.  Weight down 6 lbs from yesterday. Troponin continues to trend down.  BP 140/89.  Reports chest pain has resolved but remains short of breath   Inpatient Medications    Scheduled Meds:  apixaban  5 mg Oral BID   cilostazol  100 mg Oral BID   colchicine  0.6 mg Oral BID   furosemide  40 mg Intravenous BID   insulin aspart  0-15 Units Subcutaneous TID WC   insulin aspart  0-5 Units Subcutaneous QHS   methylPREDNISolone (SOLU-MEDROL) injection  125 mg Intravenous Q12H   metoprolol tartrate  25 mg Oral BID   pantoprazole  40 mg Oral Daily   sodium chloride flush  3 mL Intravenous Q12H   umeclidinium bromide  1 puff Inhalation Daily   Continuous Infusions:  cefTRIAXone (ROCEPHIN)  IV 1 g (09/15/22 2145)   PRN Meds: albuterol, ibuprofen, nicotine polacrilex, polyethylene glycol   Vital Signs    Vitals:   09/16/22 0000 09/16/22 0503 09/16/22 0509 09/16/22 0759  BP: 132/89  (!) 140/89   Pulse: 83  97   Resp: 19  20   Temp: 97.7 F (36.5 C)  98.2 F (36.8 C)   TempSrc: Oral  Oral   SpO2: 95%  100% 95%  Weight:  63 kg    Height:        Intake/Output Summary (Last 24 hours) at 09/16/2022 0837 Last data filed at 09/16/2022 0503 Gross per 24 hour  Intake --  Output 1150 ml  Net -1150 ml   Filed Weights   09/13/22 1651 09/15/22 0600 09/16/22 0503  Weight: 66.1 kg 65.9 kg 63 kg    Telemetry    Sinus rhythm - Personally Reviewed  ECG    Anterior ST elevation on admission - Personally Reviewed  Physical Exam   GEN: No acute distress.   Neck: + JVD Cardiac: RRR, no murmurs, rubs, or gallops.  Respiratory: Diminished breath sounds GI: Soft, nontender, non-distended  MS: No edema; No deformity. Neuro:  Nonfocal  Psych: Normal affect   Labs     Chemistry Recent Labs  Lab 09/14/22 0024 09/15/22 0208 09/16/22 0056  NA 137 137 138  K 4.1 4.1 3.7  CL 102 104 101  CO2 17* 21* 22  GLUCOSE 150* 135* 137*  BUN 18 26* 33*  CREATININE 0.90 0.76 0.88  CALCIUM 8.6* 8.9 9.0  PROT 6.8 7.2 6.6  ALBUMIN 2.9* 3.1* 3.0*  AST 235* 248* 199*  ALT 236* 418* 383*  ALKPHOS 174* 181* 224*  BILITOT 1.0 0.7 0.5  GFRNONAA >60 >60 >60  ANIONGAP 18* 12 15     Hematology Recent Labs  Lab 09/13/22 0727 09/13/22 0743 09/14/22 0024 09/16/22 0056  WBC 8.0  --  8.4 11.5*  RBC 4.17  --  4.02 3.83*  HGB 12.3 10.9* 11.7* 11.1*  HCT 37.2 32.0* 35.3* 34.2*  MCV 89.2  --  87.8 89.3  MCH 29.5  --  29.1 29.0  MCHC 33.1  --  33.1 32.5  RDW 15.7*  --  15.7* 16.0*  PLT 307  --  324 333    Cardiac EnzymesNo results for input(s): "TROPONINI" in the last 168 hours. No results for input(s): "TROPIPOC" in the last 168 hours.   BNP Recent Labs  Lab 09/13/22  0841  BNP 278.5*     DDimer No results for input(s): "DDIMER" in the last 168 hours.   Summary of Pertinent studies    TTE: 8/3 ejection fraction is 50 to 55% with anterior, mid, and basal septal hypokinesis  Cardiac cath: 09/10/2022 normal coronary arteries  Imaging: CMR consistent with acute myocarditis, LVEF 35%, RVEF 30%  Labs: TnI peaked 1,521. CK 2,683 ESR 56  Patient Profile     89F with stage IIIa hepatocellular carcinoma, PAD, COPD, HCV we are consulted for troponin elevation  Assessment & Plan    Immune checkpoint inhibitor myocarditis: She was treated with tremelimumab and durvalumab on 08/15/2022.  She was admitted 09/08/2022 with NSTEMI.  Troponin peak 483.  Echo 7/30 showed EF 60 to 65%, normal RV function.  Cath 7/31 showed mild nonobstructive CAD.  She was discharged and presented back on 8/3 with chest pain and troponin elevation to 1521.  Echo 8/3 shows EF 50 to 55%, mild RV dysfunction.  Suspected checkpoint inhibitor myocarditis, as durvalumab has been associated  with myocarditis.  CMR 8/5 consistent with acute myocarditis, LVEF 35%, RVEF 30% -Holding further durvalumab and  tremelimumab -For checkpoint inhibitor myocarditis, recommended treatment is IV Solu-Medrol daily x 3-5 days, then 1mg /kg p.o. prednisone daily, and taper over 4 to 6 weeks.  Seems to be responding to steroids, troponin has been downtrending and she reports chest pain has resolved.  Discussed switching her to p.o. prednisone today but states she is having difficulty swallowing pills and would prefer to stay on IV steroids for 1 more day.  Will continue IV Solu-Medrol today, plan to transition to p.o. prednisone 60 mg daily tomorrow with plans for slow wean over 4 to 6 weeks -Suspect component of pericarditis as well, as reports chest pain worsened with deep inspiration or lying flat.  Added colchicine  Acute combined heart failure: in setting of suspected myocarditis as above. Echo on admission showed EF 50-55%.  CMR 8/5 showed LVEF 35%, RVEF 30%.   -Continue IV lasix 40 mg BID -Strict I/Os and daily weights -She had been started on metoprolol, would hold for now in setting of decompensated heart failure -Start losartan 25 mg daily -Can add additional GDMT as tolerated  Portal vein thrombosis: Started on heparin initially, has transitioned to Eliquis  Hepatocellular carcinoma: Oncology following.      For questions or updates, please contact [facility] Please consult [email or web address] for contact info under Cardiology/STEMI.      Signed, Little Ishikawa, MD 09/16/2022, 8:37 AM

## 2022-09-16 NOTE — Progress Notes (Signed)
Night cross-coverage  Notified by RN that patient woke up complaining of shortness of breath and appears very anxious.  She is not complaining of any chest pain.  She has crackles on auscultation of the lungs but no wheezing.  She was given an albuterol neb treatment without much improvement.  She is on scheduled IV Lasix 40 mg twice daily for fluid overload and last dose was around 5:19 PM. Current vital signs: Temperature 97.7 F, heart rate 82, respiratory rate 28, blood pressure 132/89, and SpO2 96% on 3 L.  No change in oxygen requirement, she was on 2 to 3 L supplemental oxygen during daytime as well.  Her breathing has improved but she continues to appear anxious.  RN reporting a similar episode yesterday night which improved after she was given Ativan.    Stat chest x-ray ordered and currently pending.  She is on IV steroids for myocarditis/myopericarditis which could be making her anxiety worse.  Will give IV Ativan 1 mg x 1.  She may need an additional dose of IV Lasix tonight depending on chest x-ray findings, renal function and blood pressure stable.  Continue to monitor closely.  Recent cardiac cath done 7/31 was showing mild nonobstructive CAD.

## 2022-09-16 NOTE — Plan of Care (Signed)
  Problem: Education: Goal: Understanding of CV disease, CV risk reduction, and recovery process will improve Outcome: Progressing   Problem: Metabolic: Goal: Ability to maintain appropriate glucose levels will improve Outcome: Progressing   Problem: Clinical Measurements: Goal: Will remain free from infection Outcome: Progressing   Problem: Education: Goal: Understanding of cardiac disease, CV risk reduction, and recovery process will improve Outcome: Not Progressing   Problem: Activity: Goal: Ability to tolerate increased activity will improve Outcome: Not Progressing   Problem: Cardiac: Goal: Ability to achieve and maintain adequate cardiovascular perfusion will improve Outcome: Not Progressing   Problem: Health Behavior/Discharge Planning: Goal: Ability to safely manage health-related needs after discharge will improve Outcome: Not Progressing   Problem: Activity: Goal: Ability to return to baseline activity level will improve Outcome: Not Progressing   Problem: Coping: Goal: Ability to adjust to condition or change in health will improve Outcome: Not Progressing   Problem: Fluid Volume: Goal: Ability to maintain a balanced intake and output will improve Outcome: Not Progressing   Problem: Nutritional: Goal: Maintenance of adequate nutrition will improve Outcome: Not Progressing Goal: Progress toward achieving an optimal weight will improve Outcome: Not Progressing   Problem: Clinical Measurements: Goal: Diagnostic test results will improve Outcome: Not Progressing Goal: Respiratory complications will improve Outcome: Not Progressing

## 2022-09-16 NOTE — Progress Notes (Signed)
Telemetry reported a change in ST elevation. RN reviewed cardiac monitoring, notified Dr. Tyson Babinski and performed an EKG for review.

## 2022-09-17 DIAGNOSIS — I5041 Acute combined systolic (congestive) and diastolic (congestive) heart failure: Secondary | ICD-10-CM | POA: Diagnosis not present

## 2022-09-17 DIAGNOSIS — I81 Portal vein thrombosis: Secondary | ICD-10-CM | POA: Diagnosis not present

## 2022-09-17 DIAGNOSIS — C220 Liver cell carcinoma: Secondary | ICD-10-CM | POA: Diagnosis not present

## 2022-09-17 DIAGNOSIS — I401 Isolated myocarditis: Secondary | ICD-10-CM

## 2022-09-17 DIAGNOSIS — B182 Chronic viral hepatitis C: Secondary | ICD-10-CM | POA: Diagnosis not present

## 2022-09-17 DIAGNOSIS — I408 Other acute myocarditis: Secondary | ICD-10-CM | POA: Diagnosis not present

## 2022-09-17 LAB — GLUCOSE, CAPILLARY
Glucose-Capillary: 125 mg/dL — ABNORMAL HIGH (ref 70–99)
Glucose-Capillary: 113 mg/dL — ABNORMAL HIGH (ref 70–99)
Glucose-Capillary: 140 mg/dL — ABNORMAL HIGH (ref 70–99)
Glucose-Capillary: 111 mg/dL — ABNORMAL HIGH (ref 70–99)

## 2022-09-17 LAB — BASIC METABOLIC PANEL
Anion gap: 11 (ref 5–15)
BUN: 32 mg/dL — ABNORMAL HIGH (ref 8–23)
CO2: 20 mmol/L — ABNORMAL LOW (ref 22–32)
Calcium: 8.9 mg/dL (ref 8.9–10.3)
Chloride: 106 mmol/L (ref 98–111)
Creatinine, Ser: 0.77 mg/dL (ref 0.44–1.00)
GFR, Estimated: >60 mL/min (ref >60)
Glucose, Bld: 144 mg/dL — ABNORMAL HIGH (ref 70–99)
Potassium: 3.6 mmol/L (ref 3.5–5.1)
Sodium: 137 mmol/L (ref 135–145)

## 2022-09-17 LAB — CBC
HCT: 35.9 % — ABNORMAL LOW (ref 36.0–46.0)
Hemoglobin: 11.9 g/dL — ABNORMAL LOW (ref 12.0–15.0)
MCH: 29.8 pg (ref 26.0–34.0)
MCHC: 33.1 g/dL (ref 30.0–36.0)
MCV: 90 fL (ref 80.0–100.0)
Platelets: 324 K/uL (ref 150–400)
RBC: 3.99 MIL/uL (ref 3.87–5.11)
RDW: 15.8 % — ABNORMAL HIGH (ref 11.5–15.5)
WBC: 11.8 K/uL — ABNORMAL HIGH (ref 4.0–10.5)
nRBC: 0 % (ref 0.0–0.2)

## 2022-09-17 LAB — TROPONIN I (HIGH SENSITIVITY): Troponin I (High Sensitivity): 199 ng/L

## 2022-09-17 MED ORDER — MAGIC MOUTHWASH
5.0000 mL | Freq: Three times a day (TID) | ORAL | Status: DC | PRN
  Filled 2022-09-17 (×2): qty 5

## 2022-09-17 MED ORDER — PREDNISONE 20 MG PO TABS
60.0000 mg | ORAL_TABLET | Freq: Every day | ORAL | Status: DC
  Administered 2022-09-17 – 2022-09-20 (×4): 60 mg via ORAL
  Filled 2022-09-17 (×5): qty 3

## 2022-09-17 MED ORDER — POTASSIUM CHLORIDE CRYS ER 20 MEQ PO TBCR
40.0000 meq | EXTENDED_RELEASE_TABLET | Freq: Every day | ORAL | Status: DC
  Administered 2022-09-18: 40 meq via ORAL
  Filled 2022-09-17: qty 2

## 2022-09-17 MED ORDER — SPIRONOLACTONE 12.5 MG HALF TABLET
12.5000 mg | ORAL_TABLET | Freq: Every day | ORAL | Status: DC
  Administered 2022-09-17 – 2022-09-20 (×4): 12.5 mg via ORAL
  Filled 2022-09-17 (×4): qty 1

## 2022-09-17 MED ORDER — POTASSIUM CHLORIDE CRYS ER 20 MEQ PO TBCR
40.0000 meq | EXTENDED_RELEASE_TABLET | Freq: Once | ORAL | Status: AC
  Administered 2022-09-17: 40 meq via ORAL
  Filled 2022-09-17: qty 2

## 2022-09-17 NOTE — Evaluation (Signed)
Clinical/Bedside Swallow Evaluation Patient Details  Name: Christine Hurley MRN: [id number] Date of Birth: 12/20/1950  Today's Date: 09/17/2022 Time: SLP Start Time (ACUTE ONLY): 1430 SLP Stop Time (ACUTE ONLY): 1450 SLP Time Calculation (min) (ACUTE ONLY): 20 min  Past Medical History:  Past Medical History:  Diagnosis Date   Abnormal nuclear stress test February '13   Small, partially reversible inferolateral ischemia. Marked hypertensive response to exercise. Low risk.   Arthritis    Chest pain with minimal risk for cardiac etiology February 2013   Evaluated with echocardiogram and Myoview, do   COPD (chronic obstructive pulmonary disease) (HCC)    Depression    Hepatitis C    Hypertension    Osteopenia of spine 05/22/2016   PAD (peripheral artery disease) (HCC) 10/01/2021   ABI: monophasic and decreased ABIs. Refer to vascular   Right tibial fracture     Past Surgical History:  Past Surgical History:  Procedure Laterality Date   DOPPLER ECHOCARDIOGRAPHY  03/26/2011   LVEF>55% normal LV wall thickness, normal LA, mild aortic sclerosis with trace to mild AI, mild to moderate TR, RVSP of , RA pressure about 5 mmHg   LEFT HEART CATH AND CORONARY ANGIOGRAPHY N/A 09/10/2022   Procedure: LEFT HEART CATH AND CORONARY ANGIOGRAPHY;  Surgeon: [place], Peter M, MD;  Location: [facility];  Service: Cardiovascular;  Laterality: N/A;   NM MYOVIEW LTD  03/27/11   post stress ejection fraction is 72%, abnormal myocardial perfusion study, this is a low risk scan   TIBIA FRACTURE SURGERY Right 09/2008   fracture of right lower leg with open reduction and internal fixation   HPI:  Patient is a 72 y.o. female with PMH:  COPD, HTN, arthritis, depression, hepatitis C, osteopenia of spine, hepatocellular carcinoma-liver mass (s/p 1 treatment with tremelimumab and durvalumab on 7/5/20240). She presented to the ED on 09/13/22 with three days h/o weakness, SOB on exertion. She was found to have  markedly high troponins, cardiology consulted with diagnosis of non-ST elevation MI. On 09/17/22, patient reported to Cardiologist that she was having difficulty swallowing, sensation of food sticking in throat and SLP swallow evaluation ordered.    Assessment / Plan / Recommendation  Clinical Impression  Patient is presnting with clinical s/s of dysphagia as per this bedside swallow evaluation, however etiology is not known. Patient has no documented or reported h/o GERD or esophageal dysphagia. She reports that she is primarily breathing through her mouth and when she swallows, "my nose stops up and it (PO's) hangs in my throat until I get a breath." She endorses globus sensation in throat as well. She told SLP that she had a breathing treatment today at noon and that helped a lot and she was able to breath well and also able to eat well at lunch time. Unfortunately, the effects of the breathing treatment did not last and then her problems "came back worse." When patient swallowed a single sip of water, she was observed to have immediate cough which sounded congested as well as patient c/o difficulty breathing. This subsided and no other PO's attempted. SLP is recommending to proceed with MBS to r/o pharyngeal phase dysphagia. Patient and son both in agreement. SLP Visit Diagnosis: Dysphagia, unspecified (R13.10)    Aspiration Risk  Mild aspiration risk    Diet Recommendation Regular;Dysphagia 3 (Mech soft)    Liquid Administration via: Cup;Straw Medication Administration: Whole meds with liquid Supervision: Patient able to self feed Compensations: Slow rate;Small sips/bites Postural Changes: Seated upright  at 90 degrees    Other  Recommendations      Recommendations for follow up therapy are one component of a multi-disciplinary discharge planning process, led by the attending physician.  Recommendations may be updated based on patient status, additional functional criteria and insurance  authorization.  Follow up Recommendations Other (comment) (TBD after MBS results)      Assistance Recommended at Discharge    Functional Status Assessment Patient has had a recent decline in their functional status and demonstrates the ability to make significant improvements in function in a reasonable and predictable amount of time.  Frequency and Duration min 1 x/week  1 week       Prognosis Prognosis for improved oropharyngeal function: Good      Swallow Study   General Date of Onset: 09/17/22 HPI: Patient is a 72 y.o. female with PMH:  COPD, HTN, arthritis, depression, hepatitis C, osteopenia of spine, hepatocellular carcinoma-liver mass (s/p 1 treatment with tremelimumab and durvalumab on 7/5/20240). She presented to the ED on 09/13/22 with three days h/o weakness, SOB on exertion. She was found to have markedly high troponins, cardiology consulted with diagnosis of non-ST elevation MI. On 09/17/22, patient reported to Cardiologist that she was having difficulty swallowing, sensation of food sticking in throat and SLP swallow evaluation ordered. Type of Study: Bedside Swallow Evaluation Previous Swallow Assessment: none found Diet Prior to this Study: Regular;Thin liquids (Level 0) Temperature Spikes Noted: No Respiratory Status: Nasal cannula History of Recent Intubation: No Behavior/Cognition: Alert;Pleasant mood;Cooperative Oral Cavity Assessment: Within Functional Limits Oral Care Completed by SLP: No Oral Cavity - Dentition: Adequate natural dentition Vision: Functional for self-feeding Self-Feeding Abilities: Able to feed self Patient Positioning: Upright in bed Baseline Vocal Quality: Other (comment) (clear and intelligible, patient reports her normal voice is much louder) Volitional Cough: Congested;Strong Volitional Swallow: Able to elicit    Oral/Motor/Sensory Function Overall Oral Motor/Sensory Function: Within functional limits   Ice Chips     Thin Liquid Thin  Liquid: Impaired Presentation: Self Fed;Straw Pharyngeal  Phase Impairments: Cough - Immediate;Throat Clearing - Immediate    Nectar Thick Nectar Thick Liquid: Not tested   Honey Thick Honey Thick Liquid: Not tested   Puree Puree: Not tested   Solid     Solid: Not tested     Angela Nevin, MA, CCC-SLP Speech Therapy

## 2022-09-17 NOTE — Plan of Care (Signed)
  Problem: Education: Goal: Understanding of cardiac disease, CV risk reduction, and recovery process will improve Outcome: Progressing Goal: Individualized Educational Video(s) Outcome: Progressing   Problem: Activity: Goal: Ability to tolerate increased activity will improve Outcome: Progressing   Problem: Cardiac: Goal: Ability to achieve and maintain adequate cardiovascular perfusion will improve Outcome: Progressing   Problem: Health Behavior/Discharge Planning: Goal: Ability to safely manage health-related needs after discharge will improve Outcome: Progressing   Problem: Education: Goal: Understanding of CV disease, CV risk reduction, and recovery process will improve Outcome: Progressing Goal: Individualized Educational Video(s) Outcome: Progressing   Problem: Activity: Goal: Ability to return to baseline activity level will improve Outcome: Progressing   Problem: Cardiovascular: Goal: Ability to achieve and maintain adequate cardiovascular perfusion will improve Outcome: Progressing Goal: Vascular access site(s) Level 0-1 will be maintained Outcome: Progressing   Problem: Health Behavior/Discharge Planning: Goal: Ability to safely manage health-related needs after discharge will improve Outcome: Progressing   Problem: Education: Goal: Ability to describe self-care measures that may prevent or decrease complications (Diabetes Survival Skills Education) will improve Outcome: Progressing Goal: Individualized Educational Video(s) Outcome: Progressing   Problem: Coping: Goal: Ability to adjust to condition or change in health will improve Outcome: Progressing   Problem: Fluid Volume: Goal: Ability to maintain a balanced intake and output will improve Outcome: Progressing   Problem: Health Behavior/Discharge Planning: Goal: Ability to identify and utilize available resources and services will improve Outcome: Progressing Goal: Ability to manage health-related  needs will improve Outcome: Progressing   Problem: Metabolic: Goal: Ability to maintain appropriate glucose levels will improve Outcome: Progressing   Problem: Nutritional: Goal: Maintenance of adequate nutrition will improve Outcome: Progressing Goal: Progress toward achieving an optimal weight will improve Outcome: Progressing   Problem: Skin Integrity: Goal: Risk for impaired skin integrity will decrease Outcome: Progressing   Problem: Tissue Perfusion: Goal: Adequacy of tissue perfusion will improve Outcome: Progressing   Problem: Education: Goal: Knowledge of General Education information will improve Description: Including pain rating scale, medication(s)/side effects and non-pharmacologic comfort measures Outcome: Progressing   Problem: Health Behavior/Discharge Planning: Goal: Ability to manage health-related needs will improve Outcome: Progressing   Problem: Clinical Measurements: Goal: Ability to maintain clinical measurements within normal limits will improve Outcome: Progressing Goal: Will remain free from infection Outcome: Progressing Goal: Diagnostic test results will improve Outcome: Progressing Goal: Respiratory complications will improve Outcome: Progressing Goal: Cardiovascular complication will be avoided Outcome: Progressing   Problem: Activity: Goal: Risk for activity intolerance will decrease Outcome: Progressing   Problem: Nutrition: Goal: Adequate nutrition will be maintained Outcome: Progressing   Problem: Coping: Goal: Level of anxiety will decrease Outcome: Progressing   Problem: Elimination: Goal: Will not experience complications related to bowel motility Outcome: Progressing Goal: Will not experience complications related to urinary retention Outcome: Progressing   Problem: Pain Managment: Goal: General experience of comfort will improve Outcome: Progressing   Problem: Safety: Goal: Ability to remain free from injury will  improve Outcome: Progressing   Problem: Skin Integrity: Goal: Risk for impaired skin integrity will decrease Outcome: Progressing

## 2022-09-17 NOTE — Progress Notes (Signed)
Progress Note  Patient Name: Christine Hurley Date of Encounter: 09/17/2022  Primary Cardiologist: Chilton Si, MD   Subjective   Net -750cc yesterday (but incomplete I/Os), net -3.0 L on admission.  Creatinine stable at 0.78.  Troponin continues to trend down.  BP 141/92.  Reports chest pain has resolved.  Continues to have shortness of breath   Inpatient Medications    Scheduled Meds:  apixaban  5 mg Oral BID   cilostazol  100 mg Oral BID   colchicine  0.6 mg Oral BID   furosemide  40 mg Intravenous BID   insulin aspart  0-15 Units Subcutaneous TID WC   insulin aspart  0-5 Units Subcutaneous QHS   losartan  25 mg Oral Daily   pantoprazole  40 mg Oral Daily   potassium chloride  20 mEq Oral Once   sodium chloride flush  3 mL Intravenous Q12H   umeclidinium bromide  1 puff Inhalation Daily   Continuous Infusions:  cefTRIAXone (ROCEPHIN)  IV 1 g (09/16/22 2229)   PRN Meds: albuterol, ibuprofen, LORazepam, nicotine polacrilex, polyethylene glycol   Vital Signs    Vitals:   09/16/22 2318 09/17/22 0353 09/17/22 0736 09/17/22 0755  BP: 130/86 120/84  (!) 141/92  Pulse: 95 96  (!) 102  Resp: 19 19  19   Temp: 98.2 F (36.8 C) 97.9 F (36.6 C)  98.8 F (37.1 C)  TempSrc: Oral Axillary  Oral  SpO2: 96% 96% 97% 100%  Weight:  63.2 kg    Height:        Intake/Output Summary (Last 24 hours) at 09/17/2022 0842 Last data filed at 09/17/2022 0731 Gross per 24 hour  Intake --  Output 751 ml  Net -751 ml   Filed Weights   09/15/22 0600 09/16/22 0503 09/17/22 0353  Weight: 65.9 kg 63 kg 63.2 kg    Telemetry    Sinus rhythm - Personally Reviewed  ECG    Anterior ST elevation on admission - Personally Reviewed  Physical Exam   GEN: No acute distress.   Neck: + JVD Cardiac: RRR, no murmurs, rubs, or gallops.  Respiratory: Diminished breath sounds GI: Soft, nontender, non-distended  MS: No edema; No deformity. Neuro:  Nonfocal  Psych: Normal affect   Labs     Chemistry Recent Labs  Lab 09/15/22 0208 09/16/22 0056 09/16/22 2328  NA 137 138 138  K 4.1 3.7 3.4*  CL 104 101 102  CO2 21* 22 20*  GLUCOSE 135* 137* 110*  BUN 26* 33* 34*  CREATININE 0.76 0.88 0.78  CALCIUM 8.9 9.0 9.0  PROT 7.2 6.6 7.0  ALBUMIN 3.1* 3.0* 3.2*  AST 248* 199* 179*  ALT 418* 383* 458*  ALKPHOS 181* 224* 245*  BILITOT 0.7 0.5 0.7  GFRNONAA >60 >60 >60  ANIONGAP 12 15 16*     Hematology Recent Labs  Lab 09/14/22 0024 09/16/22 0056 09/16/22 2328  WBC 8.4 11.5* 11.8*  RBC 4.02 3.83* 3.99  HGB 11.7* 11.1* 11.9*  HCT 35.3* 34.2* 35.9*  MCV 87.8 89.3 90.0  MCH 29.1 29.0 29.8  MCHC 33.1 32.5 33.1  RDW 15.7* 16.0* 15.8*  PLT 324 333 324    Cardiac EnzymesNo results for input(s): "TROPONINI" in the last 168 hours. No results for input(s): "TROPIPOC" in the last 168 hours.   BNP Recent Labs  Lab 09/13/22 0841  BNP 278.5*     DDimer No results for input(s): "DDIMER" in the last 168 hours.   Summary of Pertinent  studies    TTE: 8/3 ejection fraction is 50 to 55% with anterior, mid, and basal septal hypokinesis  Cardiac cath: 09/10/2022 normal coronary arteries  Imaging: CMR consistent with acute myocarditis, LVEF 35%, RVEF 30%  Labs: TnI peaked 1,521. CK 2,683 ESR 56  Patient Profile     24F with stage IIIa hepatocellular carcinoma, PAD, COPD, HCV we are consulted for troponin elevation  Assessment & Plan    Immune checkpoint inhibitor myocarditis: She was treated with tremelimumab and durvalumab on 08/15/2022.  She was admitted 09/08/2022 with NSTEMI.  Troponin peak 483.  Echo 7/30 showed EF 60 to 65%, normal RV function.  Cath 7/31 showed mild nonobstructive CAD.  She was discharged and presented back on 8/3 with chest pain and troponin elevation to 1521.  Echo 8/3 shows EF 50 to 55%, mild RV dysfunction.  Suspected checkpoint inhibitor myocarditis, as durvalumab has been associated with myocarditis.  CMR 8/5 consistent with acute  myocarditis, LVEF 35%, RVEF 30% -Holding further durvalumab and  tremelimumab -For checkpoint inhibitor myocarditis, recommended treatment is IV Solu-Medrol daily x 3-5 days, then 1mg /kg p.o. prednisone daily, and taper over 4 to 6 weeks.  Seems to be responding to steroids, troponin has been downtrending and she reports chest pain has resolved.  Will transition to p.o. prednisone 60 mg daily today with plans for slow wean over 4 to 6 weeks -Suspect component of pericarditis as well, as reports chest pain worsened with deep inspiration or lying flat.  Added colchicine  Acute combined heart failure: in setting of suspected myocarditis as above. Echo on admission showed EF 50-55%.  CMR 8/5 showed LVEF 35%, RVEF 30%.   -Continue IV lasix 40 mg BID -Strict I/Os and daily weights -She had been started on metoprolol, would hold for now in setting of decompensated heart failure -Continue losartan 25 mg daily -Add spironolactone 12.5 mg daily -Can add additional GDMT as tolerated  Portal vein thrombosis: Started on heparin initially, has transitioned to Eliquis  Hepatocellular carcinoma: Oncology following.  Dysphagia: reports having difficulty swallowing pills and eating.  Will discuss with primary team, may need swallow study vs GI eval  For questions or updates, please contact [facility] Please consult [email or web address] for contact info under Cardiology/STEMI.      Signed, Christine Ishikawa, MD 09/17/2022, 8:42 AM

## 2022-09-17 NOTE — Progress Notes (Signed)
PROGRESS NOTE    Christine Hurley  QIO:[id number] DOB: 06-Dec-1950 DOA: 09/13/2022 PCP: Willow Ora, MD    Brief Narrative:   Christine Hurley is a 72 y.o. female with medical history significant of hepatocellular carcinoma-liver mass (s/p 1 treatment with tremelimumab and durvalumab on 7/5/20240 who was discharged from [facility] long hospital one day piror to presentation for suspected non-ST elevation MI.  She did have angiogram done and there was no findings for severe CAD so no intervention was done.  After she went home she again complained of chest pain with exertional dyspnea followed by progressive shortness of breath to the extent of shortness of breath at rest.  In the ED, workup showed worsening troponin, CK  as well as new EKG changes.  Cardiology and patient's primary oncologist has seen the patient and have determined the patient may have pericarditis/ myocarditis and patient has been started on  steroid, oxygen supplementation and IV diuretics.  Cardiology actively following, pending clinical improvement  Assessment and Plan:  * Myocarditis (HCC)/myopericarditis Improvement with dyspnea and shortness of breath but complains of dysphagia.  History of non-STEMI S/P cardiac cath 09/10/2022.  COVID-19 and respiratory viral panel negative. 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  Patient  received Solu-Medrol 1000 mg on 09/13/2022, and was on 125 mg IV twice daily.  Steroid has been changed to prednisone 60 mg daily.  Plan is to continue for 4 to 6 weeks on discharge.  Continue colchicine.  Cardiac MRI on 09/15/2022 with findings of myocarditis.  Troponins are downtrending.  Cardiology on board and patient clinically improving.  Dysphagia, states that there is a sticking sensation in the throat.  Will put the patient on Magic mouthwash, speech and swallow evaluation.   Fluid overload Mild pleural effusion on CT scan as well as a small amount of simple fluid  attenuation ascites throughout abdomen and pelvis.  Currently on Lasix 40 mg IV twice daily.   Question pneumonia so was started on Rocephin and Zithromax and has completed the course.[name]  CRP was 0.7.  Sed rate was elevated at 48.  Procalcitonin at 0.8.  Patient is negative balance for 3395 mL.   Hepatocellular carcinoma IIA  Durvalumab and tremelimumab have been reported to cause myocarditis and pericarditis .  Continue to hold.  Oncology on board.  On prednisone at this time.  Portal vein thrombosis Diagnosed in June 2024.  On Eliquis  Nicotine use disorder.  On nicotine patch.  Chronic hepatitis C without hepatic coma (HCC) Monitor LFTs.     DVT prophylaxis: SCDs Start: 09/13/22 1142 apixaban (ELIQUIS) tablet 5 mg   Code Status:     Code Status: Full Code  Disposition: Uncertain at this time.  Likely home in 2 to 3 days once okay with cardiology..  Status is: Inpatient  Remains inpatient appropriate because: Steroids, IV diuretics, pending clinical improvement.   Family Communication:  Spoke with the patient's son at bedside on 09/17/2022.[name]  Consultants:  Cardiology Oncology  Procedures:  Cardiac MRI  Antimicrobials:  Completed Rocephin and Zithromax  Anti-infectives (From admission, onward)    Start     Dose/Rate Route Frequency Ordered Stop   09/13/22 2130  cefTRIAXone (ROCEPHIN) 1 g in sodium chloride 0.9 % 100 mL IVPB        1 g 200 mL/hr over 30 Minutes Intravenous Every 24 hours 09/13/22 2044 09/17/22 2359   09/13/22 2130  azithromycin (ZITHROMAX) 500 mg in sodium chloride 0.9 % 250 mL IVPB  500 mg 250 mL/hr over 60 Minutes Intravenous Every 24 hours 09/13/22 2044 09/16/22 0006      Subjective: Today, patient was seen and examined at bedside.  Complains of throat discomfort difficulty swallowing with some food stickiness.  Overall improved but some shortness of breath.  No chest pain.  Objective: Vitals:   09/17/22 0353 09/17/22 0736 09/17/22 0755  09/17/22 1125  BP: 120/84  (!) 141/92   Pulse: 96  (!) 102   Resp: 19  19   Temp: 97.9 F (36.6 C)  98.8 F (37.1 C)   TempSrc: Axillary  Oral Oral  SpO2: 96% 97% 100%   Weight: 63.2 kg     Height:        Intake/Output Summary (Last 24 hours) at 09/17/2022 1302 Last data filed at 09/17/2022 0800 Gross per 24 hour  Intake 120 ml  Output 751 ml  Net -631 ml   Filed Weights   09/15/22 0600 09/16/22 0503 09/17/22 0353  Weight: 65.9 kg 63 kg 63.2 kg    Physical Examination: Body mass index is 22.49 kg/m.   General:  Average built, not in obvious distress, on nasal cannula oxygen, HENT:   No scleral pallor or icterus noted. Oral mucosa is moist.  Prominent neck veins. Chest:  Diminished breath sounds bilaterally.   CVS: S1 &S2 heard. No murmur.  Regular rate and rhythm. Abdomen: Soft,  nondistended.  Bowel sounds are heard.  Mild right upper quadrant tenderness Extremities: No cyanosis, clubbing or edema.  Peripheral pulses are palpable. Psych: Alert, awake and oriented, normal mood CNS:  No cranial nerve deficits.  Power equal in all extremities.   Skin: Warm and dry.  No rashes noted.  Data Reviewed:   CBC: Recent Labs  Lab 09/12/22 0446 09/13/22 0727 09/13/22 0743 09/14/22 0024 09/16/22 0056 09/16/22 2328  WBC 6.7 8.0  --  8.4 11.5* 11.8*  NEUTROABS  --  6.8  --   --   --   --   HGB 11.5* 12.3 10.9* 11.7* 11.1* 11.9*  HCT 35.1* 37.2 32.0* 35.3* 34.2* 35.9*  MCV 90.7 89.2  --  87.8 89.3 90.0  PLT 235 307  --  324 333 324    Basic Metabolic Panel: Recent Labs  Lab 09/12/22 0446 09/13/22 0727 09/14/22 0024 09/15/22 0208 09/16/22 0056 09/16/22 2328 09/17/22 0915  NA 133*   < > 137 137 138 138 137  K 3.7   < > 4.1 4.1 3.7 3.4* 3.6  CL 104   < > 102 104 101 102 106  CO2 20*   < > 17* 21* 22 20* 20*  GLUCOSE 104*   < > 150* 135* 137* 110* 144*  BUN 19   < > 18 26* 33* 34* 32*  CREATININE 0.64   < > 0.90 0.76 0.88 0.78 0.77  CALCIUM 8.4*   < > 8.6* 8.9 9.0  9.0 8.9  MG 2.0  --   --   --  2.3 2.1  --   PHOS 4.3  --   --   --   --   --   --    < > = values in this interval not displayed.    Liver Function Tests: Recent Labs  Lab 09/13/22 0727 09/14/22 0024 09/15/22 0208 09/16/22 0056 09/16/22 2328  AST 288* 235* 248* 199* 179*  ALT 263* 236* 418* 383* 458*  ALKPHOS 198* 174* 181* 224* 245*  BILITOT 0.8 1.0 0.7 0.5 0.7  PROT 7.5 6.8  7.2 6.6 7.0  ALBUMIN 3.3* 2.9* 3.1* 3.0* 3.2*     Radiology Studies: DG CHEST PORT 1 VIEW  Result Date: 09/16/2022 CLINICAL DATA:  [id number] with shortness of breath. EXAM: PORTABLE CHEST 1 VIEW COMPARISON:  Portable chest and CTA chest both 09/13/2022. FINDINGS: The lungs expiratory with consolidation or atelectasis in the hypoinflated lower lung zones and small right pleural effusion with minimal left pleural fluid. The lungs are otherwise clear as far as visualized. The aorta is tortuous and calcified with stable mediastinum. There is mild cardiomegaly without CHF pattern. Degenerative change thoracic spine and slight dextroscoliosis. Comparison to the prior study reveals no significant change in overall aeration. IMPRESSION: 1. Expiratory chest x-ray with consolidation or atelectasis in the hypoinflated lower lung zones and small right pleural effusion with minimal left pleural fluid. 2. Mild cardiomegaly. Electronically Signed   By: Almira Bar M.D.   On: 09/16/2022 00:50      LOS: 4 days    Joycelyn Das, MD [facility] Available via Epic secure chat 7am-7pm After these hours, please refer to coverage provider listed on [email or web address] 09/17/2022, 1:02 PM

## 2022-09-18 ENCOUNTER — Inpatient Hospital Stay (HOSPITAL_COMMUNITY): Payer: Medicare HMO

## 2022-09-18 DIAGNOSIS — I408 Other acute myocarditis: Secondary | ICD-10-CM | POA: Diagnosis not present

## 2022-09-18 DIAGNOSIS — I409 Acute myocarditis, unspecified: Secondary | ICD-10-CM | POA: Diagnosis not present

## 2022-09-18 DIAGNOSIS — B182 Chronic viral hepatitis C: Secondary | ICD-10-CM | POA: Diagnosis not present

## 2022-09-18 DIAGNOSIS — F17219 Nicotine dependence, cigarettes, with unspecified nicotine-induced disorders: Secondary | ICD-10-CM | POA: Diagnosis not present

## 2022-09-18 DIAGNOSIS — I81 Portal vein thrombosis: Secondary | ICD-10-CM | POA: Diagnosis not present

## 2022-09-18 DIAGNOSIS — I5041 Acute combined systolic (congestive) and diastolic (congestive) heart failure: Secondary | ICD-10-CM | POA: Diagnosis not present

## 2022-09-18 LAB — CULTURE, BLOOD (ROUTINE X 2)
Culture: NO GROWTH
Culture: NO GROWTH
Special Requests: ADEQUATE
Special Requests: ADEQUATE

## 2022-09-18 LAB — GLUCOSE, CAPILLARY
Glucose-Capillary: 103 mg/dL — ABNORMAL HIGH (ref 70–99)
Glucose-Capillary: 125 mg/dL — ABNORMAL HIGH (ref 70–99)
Glucose-Capillary: 103 mg/dL — ABNORMAL HIGH (ref 70–99)
Glucose-Capillary: 127 mg/dL — ABNORMAL HIGH (ref 70–99)

## 2022-09-18 LAB — TROPONIN I (HIGH SENSITIVITY): Troponin I (High Sensitivity): 126 ng/L (ref <18)

## 2022-09-18 MED ORDER — COLCHICINE 0.6 MG PO TABS
0.6000 mg | ORAL_TABLET | Freq: Every day | ORAL | Status: DC
  Administered 2022-09-19 – 2022-09-20 (×2): 0.6 mg via ORAL
  Filled 2022-09-18 (×2): qty 1

## 2022-09-18 MED ORDER — CARVEDILOL 3.125 MG PO TABS
3.1250 mg | ORAL_TABLET | Freq: Two times a day (BID) | ORAL | Status: DC
  Administered 2022-09-18 – 2022-09-20 (×5): 3.125 mg via ORAL
  Filled 2022-09-18 (×5): qty 1

## 2022-09-18 MED ORDER — METOPROLOL SUCCINATE ER 25 MG PO TB24: 25.0000 mg | ORAL_TABLET | Freq: Every day | ORAL | Status: DC

## 2022-09-18 MED ORDER — FUROSEMIDE 40 MG PO TABS
40.0000 mg | ORAL_TABLET | Freq: Every day | ORAL | Status: DC
  Administered 2022-09-18 – 2022-09-20 (×3): 40 mg via ORAL
  Filled 2022-09-18 (×3): qty 1

## 2022-09-18 NOTE — Plan of Care (Signed)
  Problem: Education: Goal: Understanding of cardiac disease, CV risk reduction, and recovery process will improve Outcome: Progressing Goal: Individualized Educational Video(s) Outcome: Progressing   Problem: Activity: Goal: Ability to tolerate increased activity will improve Outcome: Progressing   Problem: Cardiac: Goal: Ability to achieve and maintain adequate cardiovascular perfusion will improve Outcome: Progressing   Problem: Health Behavior/Discharge Planning: Goal: Ability to safely manage health-related needs after discharge will improve Outcome: Progressing   Problem: Education: Goal: Understanding of CV disease, CV risk reduction, and recovery process will improve Outcome: Progressing Goal: Individualized Educational Video(s) Outcome: Progressing   Problem: Activity: Goal: Ability to return to baseline activity level will improve Outcome: Progressing   Problem: Cardiovascular: Goal: Ability to achieve and maintain adequate cardiovascular perfusion will improve Outcome: Progressing Goal: Vascular access site(s) Level 0-1 will be maintained Outcome: Progressing   Problem: Health Behavior/Discharge Planning: Goal: Ability to safely manage health-related needs after discharge will improve Outcome: Progressing   Problem: Education: Goal: Ability to describe self-care measures that may prevent or decrease complications (Diabetes Survival Skills Education) will improve Outcome: Progressing Goal: Individualized Educational Video(s) Outcome: Progressing   Problem: Coping: Goal: Ability to adjust to condition or change in health will improve Outcome: Progressing   Problem: Fluid Volume: Goal: Ability to maintain a balanced intake and output will improve Outcome: Progressing   Problem: Health Behavior/Discharge Planning: Goal: Ability to identify and utilize available resources and services will improve Outcome: Progressing Goal: Ability to manage health-related  needs will improve Outcome: Progressing   Problem: Metabolic: Goal: Ability to maintain appropriate glucose levels will improve Outcome: Progressing   Problem: Nutritional: Goal: Maintenance of adequate nutrition will improve Outcome: Progressing Goal: Progress toward achieving an optimal weight will improve Outcome: Progressing   Problem: Skin Integrity: Goal: Risk for impaired skin integrity will decrease Outcome: Progressing   Problem: Tissue Perfusion: Goal: Adequacy of tissue perfusion will improve Outcome: Progressing   Problem: Education: Goal: Knowledge of General Education information will improve Description: Including pain rating scale, medication(s)/side effects and non-pharmacologic comfort measures Outcome: Progressing   Problem: Health Behavior/Discharge Planning: Goal: Ability to manage health-related needs will improve Outcome: Progressing   Problem: Clinical Measurements: Goal: Ability to maintain clinical measurements within normal limits will improve Outcome: Progressing Goal: Will remain free from infection Outcome: Progressing Goal: Diagnostic test results will improve Outcome: Progressing Goal: Respiratory complications will improve Outcome: Progressing Goal: Cardiovascular complication will be avoided Outcome: Progressing   Problem: Activity: Goal: Risk for activity intolerance will decrease Outcome: Progressing   Problem: Nutrition: Goal: Adequate nutrition will be maintained Outcome: Progressing   Problem: Coping: Goal: Level of anxiety will decrease Outcome: Progressing   Problem: Elimination: Goal: Will not experience complications related to bowel motility Outcome: Progressing Goal: Will not experience complications related to urinary retention Outcome: Progressing   Problem: Pain Managment: Goal: General experience of comfort will improve Outcome: Progressing   Problem: Safety: Goal: Ability to remain free from injury will  improve Outcome: Progressing   Problem: Skin Integrity: Goal: Risk for impaired skin integrity will decrease Outcome: Progressing

## 2022-09-18 NOTE — Progress Notes (Signed)
PROGRESS NOTE    Christine Hurley  WUJ:[id number] DOB: 04/09/1950 DOA: 09/13/2022 PCP: Willow Ora, MD    Brief Narrative:  Christine Hurley is a 72 y.o. female with medical history significant of hepatocellular carcinoma-liver mass (s/p 1 treatment with tremelimumab and durvalumab on 7/5/20240 who was discharged from [facility] long hospital one day piror to presentation for suspected non-ST elevation MI.  She did have angiogram done and there was no findings for severe CAD so no intervention was done.  After she went home she again complained of chest pain with exertional dyspnea followed by progressive shortness of breath to the extent of shortness of breath at rest.  In the ED, workup showed worsening troponin, CK  as well as new EKG changes.  Cardiology and patient's primary oncologist has seen the patient and have determined the patient may have pericarditis/ myocarditis and patient has been started on  steroid, oxygen supplementation and IV diuretics.  Cardiology actively following, pending clinical improvement  Assessment and Plan:  * Myocarditis (HCC)/myopericarditis Cardiac MRI on 09/15/2022 with findings of myocarditis.  History of non-STEMI S/P cardiac cath 09/10/2022.  COVID-19 and respiratory viral panel negative. 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  Patient  received Solu-Medrol 1000 mg on 09/13/2022, and was on 125 mg IV twice daily.  Steroid has been changed to prednisone 60 mg daily since 8/7.  Plan is to continue for 4 to 6 weeks on discharge.  Continue colchicine.   Troponins with downtrend.  Cardiology on board.  Patient still with shortness of breath on minimal exertion.  No chest pain  Dysphagia,  sticking sensation in the throat.  Gets better in certain positions.  Continue Magic mouthwash, speech and swallow evaluation.  For modified barium swallow today.   Fluid overload Mild pleural effusion on CT scan as well as a small amount of simple  fluid attenuation ascites throughout abdomen and pelvis.  Lasix has been changed to 40 g p.o. daily.  Question pneumonia so was started on Rocephin and Zithromax and has completed the course.[name]  CRP was 0.7.  Sed rate was elevated at 48.  Procalcitonin at 0.8.  Patient is negative balance for 3355 mL.  Follow cardiology recommendations.  Hepatocellular carcinoma IIA  Durvalumab and tremelimumab have been reported to cause myocarditis and pericarditis .  Continue to hold.  Oncology on board.  On prednisone at this time.  Portal vein thrombosis Diagnosed in June 2024.  On Eliquis  Nicotine use disorder.  On nicotine patch.  Chronic hepatitis C without hepatic coma (HCC) Monitor LFTs.     DVT prophylaxis: SCDs Start: 09/13/22 1142 apixaban (ELIQUIS) tablet 5 mg   Code Status:     Code Status: Full Code  Disposition: Uncertain at this time.  Likely home in 2 to 3 days once okay with cardiology.  Will get PT evaluation.  Status is: Inpatient  Remains inpatient appropriate because: Steroids, diuretics, pending clinical improvement.   Family Communication:  Spoke with the patient's son at bedside on 09/17/2022.[name]  Consultants:  Cardiology Oncology  Procedures:  Cardiac MRI  Antimicrobials:  None currently.   Subjective: Today, patient was seen and examined at bedside.  Denies any chest pain or overt dyspnea but has some shortness of breath on minimal exertion.  Complains of difficulty swallowing and gets better in certain position.    Objective: Vitals:   09/18/22 0019 09/18/22 0328 09/18/22 0610 09/18/22 0815  BP: 122/80 112/73  110/76  Pulse: 95 96  95  Resp: 17 20  20   Temp: 97.8 F (36.6 C) 98.1 F (36.7 C)  98.1 F (36.7 C)  TempSrc: Oral Oral  Oral  SpO2: 96% 96%  96%  Weight:   61.7 kg   Height:        Intake/Output Summary (Last 24 hours) at 09/18/2022 1058 Last data filed at 09/18/2022 1610 Gross per 24 hour  Intake 240 ml  Output 701 ml  Net -461 ml   Filed  Weights   09/16/22 0503 09/17/22 0353 09/18/22 0610  Weight: 63 kg 63.2 kg 61.7 kg    Physical Examination: Body mass index is 21.95 kg/m.  General:  Average built, not in obvious distress, on nasal cannula oxygen HENT:   No scleral pallor or icterus noted. Oral mucosa is moist.  Chest:    Diminished breath sounds bilaterally. No crackles or wheezes.  CVS: S1 &S2 heard. No murmur.  Regular rate and rhythm. Abdomen: Soft, nontender, mild right upper quadrant tenderness.  Nondistended.  Bowel sounds are heard.   Extremities: No cyanosis, clubbing or edema.  Peripheral pulses are palpable. Psych: Alert, awake and oriented, normal mood CNS:  No cranial nerve deficits.  Power equal in all extremities.   Skin: Warm and dry.  No rashes noted.  Data Reviewed:   CBC: Recent Labs  Lab 09/13/22 0727 09/13/22 0743 09/14/22 0024 09/16/22 0056 09/16/22 2328 09/18/22 0217  WBC 8.0  --  8.4 11.5* 11.8* 11.5*  NEUTROABS 6.8  --   --   --   --   --   HGB 12.3 10.9* 11.7* 11.1* 11.9* 11.7*  HCT 37.2 32.0* 35.3* 34.2* 35.9* 36.0  MCV 89.2  --  87.8 89.3 90.0 89.3  PLT 307  --  324 333 324 337    Basic Metabolic Panel: Recent Labs  Lab 09/12/22 0446 09/13/22 0727 09/15/22 0208 09/16/22 0056 09/16/22 2328 09/17/22 0915 09/18/22 0217  NA 133*   < > 137 138 138 137 136  K 3.7   < > 4.1 3.7 3.4* 3.6 3.3*  CL 104   < > 104 101 102 106 103  CO2 20*   < > 21* 22 20* 20* 22  GLUCOSE 104*   < > 135* 137* 110* 144* 97  BUN 19   < > 26* 33* 34* 32* 34*  CREATININE 0.64   < > 0.76 0.88 0.78 0.77 0.95  CALCIUM 8.4*   < > 8.9 9.0 9.0 8.9 8.6*  MG 2.0  --   --  2.3 2.1  --  2.2  PHOS 4.3  --   --   --   --   --   --    < > = values in this interval not displayed.    Liver Function Tests: Recent Labs  Lab 09/13/22 0727 09/14/22 0024 09/15/22 0208 09/16/22 0056 09/16/22 2328  AST 288* 235* 248* 199* 179*  ALT 263* 236* 418* 383* 458*  ALKPHOS 198* 174* 181* 224* 245*  BILITOT 0.8 1.0  0.7 0.5 0.7  PROT 7.5 6.8 7.2 6.6 7.0  ALBUMIN 3.3* 2.9* 3.1* 3.0* 3.2*     Radiology Studies: No results found.    LOS: 5 days    Joycelyn Das, MD [facility] Available via Epic secure chat 7am-7pm After these hours, please refer to coverage provider listed on [email or web address] 09/18/2022, 10:58 AM

## 2022-09-18 NOTE — Progress Notes (Signed)
Progress Note  Patient Name: Christine Hurley Date of Encounter: 09/18/2022  Primary Cardiologist: Chilton Si, MD   Subjective   Recorded as net -300 cc yesterday but also with unmeasured UOP.  Net -3.4 L on admission.  Creatinine 0.95 this morning.  Troponin continues to trend down.  BP 110/76.  Denies any chest pain but does report some dyspnea   Inpatient Medications    Scheduled Meds:  apixaban  5 mg Oral BID   cilostazol  100 mg Oral BID   colchicine  0.6 mg Oral BID   furosemide  40 mg Intravenous BID   insulin aspart  0-15 Units Subcutaneous TID WC   insulin aspart  0-5 Units Subcutaneous QHS   losartan  25 mg Oral Daily   pantoprazole  40 mg Oral Daily   potassium chloride  40 mEq Oral Daily   predniSONE  60 mg Oral Q breakfast   sodium chloride flush  3 mL Intravenous Q12H   spironolactone  12.5 mg Oral Daily   umeclidinium bromide  1 puff Inhalation Daily   Continuous Infusions:   PRN Meds: albuterol, ibuprofen, LORazepam, magic mouthwash, nicotine polacrilex, polyethylene glycol   Vital Signs    Vitals:   09/18/22 0019 09/18/22 0328 09/18/22 0610 09/18/22 0815  BP: 122/80 112/73  110/76  Pulse: 95 96  95  Resp: 17 20  20   Temp: 97.8 F (36.6 C) 98.1 F (36.7 C)  98.1 F (36.7 C)  TempSrc: Oral Oral  Oral  SpO2: 96% 96%  96%  Weight:   61.7 kg   Height:        Intake/Output Summary (Last 24 hours) at 09/18/2022 0845 Last data filed at 09/18/2022 4696 Gross per 24 hour  Intake 240 ml  Output 701 ml  Net -461 ml   Filed Weights   09/16/22 0503 09/17/22 0353 09/18/22 0610  Weight: 63 kg 63.2 kg 61.7 kg    Telemetry    Sinus rhythm - Personally Reviewed  ECG    Anterior ST elevation on admission - Personally Reviewed  Physical Exam   GEN: No acute distress.   Neck: No JVD Cardiac: RRR, no murmurs, rubs, or gallops.  Respiratory: CTAB GI: Soft, nontender, non-distended  MS: No edema; No deformity. Neuro:  Nonfocal  Psych: Normal  affect   Labs    Chemistry Recent Labs  Lab 09/15/22 0208 09/16/22 0056 09/16/22 2328 09/17/22 0915 09/18/22 0217  NA 137 138 138 137 136  K 4.1 3.7 3.4* 3.6 3.3*  CL 104 101 102 106 103  CO2 21* 22 20* 20* 22  GLUCOSE 135* 137* 110* 144* 97  BUN 26* 33* 34* 32* 34*  CREATININE 0.76 0.88 0.78 0.77 0.95  CALCIUM 8.9 9.0 9.0 8.9 8.6*  PROT 7.2 6.6 7.0  --   --   ALBUMIN 3.1* 3.0* 3.2*  --   --   AST 248* 199* 179*  --   --   ALT 418* 383* 458*  --   --   ALKPHOS 181* 224* 245*  --   --   BILITOT 0.7 0.5 0.7  --   --   GFRNONAA >60 >60 >60 >60 >60  ANIONGAP 12 15 16* 11 11     Hematology Recent Labs  Lab 09/16/22 0056 09/16/22 2328 09/18/22 0217  WBC 11.5* 11.8* 11.5*  RBC 3.83* 3.99 4.03  HGB 11.1* 11.9* 11.7*  HCT 34.2* 35.9* 36.0  MCV 89.3 90.0 89.3  MCH 29.0 29.8 29.0  MCHC 32.5 33.1 32.5  RDW 16.0* 15.8* 15.9*  PLT 333 324 337    Cardiac EnzymesNo results for input(s): "TROPONINI" in the last 168 hours. No results for input(s): "TROPIPOC" in the last 168 hours.   BNP Recent Labs  Lab 09/13/22 0841  BNP 278.5*     DDimer No results for input(s): "DDIMER" in the last 168 hours.   Summary of Pertinent studies    TTE: 8/3 ejection fraction is 50 to 55% with anterior, mid, and basal septal hypokinesis  Cardiac cath: 09/10/2022 normal coronary arteries  Imaging: CMR consistent with acute myocarditis, LVEF 35%, RVEF 30%  Labs: TnI peaked 1,521. CK 2,683 ESR 56  Patient Profile     40F with stage IIIa hepatocellular carcinoma, PAD, COPD, HCV we are consulted for troponin elevation  Assessment & Plan    Immune checkpoint inhibitor myocarditis: She was treated with tremelimumab and durvalumab on 08/15/2022.  She was admitted 09/08/2022 with NSTEMI.  Troponin peak 483.  Echo 7/30 showed EF 60 to 65%, normal RV function.  Cath 7/31 showed mild nonobstructive CAD.  She was discharged and presented back on 8/3 with chest pain and troponin elevation to  1521.  Echo 8/3 shows EF 50 to 55%, mild RV dysfunction.  Suspected checkpoint inhibitor myocarditis, as durvalumab has been associated with myocarditis.  CMR 8/5 consistent with acute myocarditis, LVEF 35%, RVEF 30% -Holding further durvalumab and  tremelimumab -For checkpoint inhibitor myocarditis, recommended treatment is IV Solu-Medrol daily x 3-5 days, then 1mg /kg p.o. prednisone daily, and taper over 4 to 6 weeks.  Seems to be responding to steroids, troponin has been downtrending and she reports chest pain has resolved.  Transitioned to p.o. prednisone 60 mg daily on 8/7 with plans for slow wean over 4 to 6 weeks -Suspect component of pericarditis as well, as reports chest pain worsened with deep inspiration or lying flat.  Added colchicine 0.6 mg daily  Acute combined heart failure: in setting of suspected myocarditis as above. Echo on admission showed EF 50-55%.  CMR 8/5 showed LVEF 35%, RVEF 30%.   -Has diuresed well, appears euvolemic.  Will transition to PO lasix 40 mg daily -Strict I/Os and daily weights -Continue losartan 25 mg daily -Continue spironolactone 12.5 mg daily -Add carvedilol 3.125 mg twice daily -Can add additional GDMT as tolerated  Portal vein thrombosis: Started on heparin initially, has transitioned to Eliquis  Hepatocellular carcinoma: Oncology following.  Dysphagia: reports having difficulty swallowing pills and eating.  Being evaluated by speech therapy  For questions or updates, please contact [facility] Please consult [email or web address] for contact info under Cardiology/STEMI.      Signed, Little Ishikawa, MD 09/18/2022, 8:45 AM

## 2022-09-18 NOTE — Consult Note (Signed)
   [facility] Inpatient Consult   09/18/2022  Maelena Haushalter 02-01-1951 [id number]  [facility] [[facility]]  Accountable Care Organization [ACO] Patient:  [employer] Medicare  Follow up:  [facility] SNP plan noted  Patient has a [employer] program for care coordination as reviewed in [facility] roster and this team is listed for care coordination follow up. Patient will likely have follow up with PCP [place] TOC calls and appointment for post hospital as well.  For questions,  Charlesetta Shanks, RN BSN CCM [facility]  [phone] business mobile phone Toll free office [phone]  *Concierge Line  [phone] Fax number: [phone] [email or web address]

## 2022-09-18 NOTE — Procedures (Signed)
Modified Barium Swallow Study  Patient Details  Name: Christine Hurley MRN: [id number] Date of Birth: 1950/06/25  Today's Date: 09/18/2022  Modified Barium Swallow completed.  Full report located under Chart Review in the Imaging Section.  History of Present Illness Patient is a 72 y.o. female with PMH:  COPD, HTN, arthritis, depression, hepatitis C, osteopenia of spine, hepatocellular carcinoma-liver mass (s/p 1 treatment with tremelimumab and durvalumab on 7/5/20240). She presented to the ED on 09/13/22 with three days h/o weakness, SOB on exertion. She was found to have markedly high troponins, cardiology consulted with diagnosis of non-ST elevation MI. On 09/17/22, patient reported to Cardiologist that she was having difficulty swallowing, sensation of food sticking in throat and SLP swallow evaluation ordered.   Clinical Impression Pt presents with functional oropharyngeal swallowing.  There was no penetration or aspiration of any consistencies trialed.  There was pharyngeal stasis of solid textures with deposition of reside on posterior pharyngeal wall, with is suspected to be 2/2 cervical osteophytes (see image below, or image series 13 in video recording of study in PACS), to which pt was sensate.  There was coughing and attempt to bring residual portion of bolus back up before it was cleared with subsequent swallow.  This is very bothersome to pt.  She notes that it helps to turn her head to the side when episodes like this occur, which may result in the realignment of bony projections to the side of the pharynx instead of impinging into the hypopharynx.  Otherwise there was very little pharyngeal residue and bolus flow was not impeded by potential osteophytes, and she achieves full epiglottic inversion during swallow.  During pill simulation there was brief vallecular stasis of tablet which cleared with subsequent swallow.  On esophageal sweep there was retention of contrast within the esophagus.   Pt is safe to continue current diet, but if symptoms persist and are bothersome, she may benefit from further evaluation to address structual causes of symptoms.  Pt has no further acute ST needs.   Recommend regular texture diet with thin liquids.  Recommend choosing softer foods to pt preferece and continuing compensatory strategies (head turn) which pt has already been implementing.     Factors that may increase risk of adverse event in presence of aspiration Rubye Oaks [employer] Clearance Coots 2021):    Swallow Evaluation Recommendations Recommendations: PO diet PO Diet Recommendation: Regular;Thin liquids (Level 0) Liquid Administration via: Cup;Straw Medication Administration: Whole meds with liquid (as tolerated) Supervision: Patient able to self-feed Swallowing strategies  : Slow rate;Small bites/sips;Multiple dry swallows after each bite/sip (head turn as needed) Postural changes: Position pt fully upright for meals Oral care recommendations: Oral care BID (2x/day) Recommended consults:  (consider workup for presumed osteophytes)      Kerrie Pleasure, MA, CCC-SLP Acute Rehabilitation Services Office: [phone] 09/18/2022,11:42 AM

## 2022-09-19 DIAGNOSIS — I5041 Acute combined systolic (congestive) and diastolic (congestive) heart failure: Secondary | ICD-10-CM | POA: Diagnosis not present

## 2022-09-19 DIAGNOSIS — B182 Chronic viral hepatitis C: Secondary | ICD-10-CM | POA: Diagnosis not present

## 2022-09-19 DIAGNOSIS — I81 Portal vein thrombosis: Secondary | ICD-10-CM | POA: Diagnosis not present

## 2022-09-19 DIAGNOSIS — I408 Other acute myocarditis: Secondary | ICD-10-CM | POA: Diagnosis not present

## 2022-09-19 LAB — GLUCOSE, CAPILLARY
Glucose-Capillary: 88 mg/dL (ref 70–99)
Glucose-Capillary: 135 mg/dL — ABNORMAL HIGH (ref 70–99)
Glucose-Capillary: 145 mg/dL — ABNORMAL HIGH (ref 70–99)
Glucose-Capillary: 140 mg/dL — ABNORMAL HIGH (ref 70–99)

## 2022-09-19 MED ORDER — PREDNISONE 20 MG PO TABS: 40.0000 mg | ORAL_TABLET | Freq: Every day | ORAL | Status: DC

## 2022-09-19 MED ORDER — PREDNISONE 5 MG PO TABS: 5.0000 mg | ORAL_TABLET | Freq: Every day | ORAL | Status: DC

## 2022-09-19 MED ORDER — PREDNISONE 20 MG PO TABS: 30.0000 mg | ORAL_TABLET | Freq: Every day | ORAL | Status: DC

## 2022-09-19 MED ORDER — EMPAGLIFLOZIN 10 MG PO TABS
10.0000 mg | ORAL_TABLET | Freq: Every day | ORAL | Status: DC
  Administered 2022-09-19 – 2022-09-20 (×2): 10 mg via ORAL
  Filled 2022-09-19 (×2): qty 1

## 2022-09-19 MED ORDER — PREDNISONE 20 MG PO TABS: 20.0000 mg | ORAL_TABLET | Freq: Every day | ORAL | Status: DC

## 2022-09-19 MED ORDER — PREDNISONE 10 MG PO TABS: 10.0000 mg | ORAL_TABLET | Freq: Every day | ORAL | Status: DC

## 2022-09-19 MED ORDER — PREDNISONE 20 MG PO TABS: 50.0000 mg | ORAL_TABLET | Freq: Every day | ORAL | Status: DC

## 2022-09-19 NOTE — Progress Notes (Signed)
PROGRESS NOTE    Christine Hurley  ZOX:[id number] DOB: Mar 27, 1950 DOA: 09/13/2022 PCP: Willow Ora, MD    Brief Narrative:   Christine Hurley is a 72 y.o. female with medical history significant of hepatocellular carcinoma-liver mass (s/p 1 treatment with tremelimumab and durvalumab on 7/5/20240 who was discharged from [facility] one day piror to presentation for suspected non-ST elevation MI.  She did have angiogram done and there was no findings for severe CAD so no intervention was done.  After she went home she again complained of chest pain with exertional dyspnea followed by progressive shortness of breath to the extent of shortness of breath at rest.  In the ED, workup showed worsening troponin, CK  as well as new EKG changes.  Cardiology and patient's primary oncologist has seen the patient and have determined the patient may have pericarditis/ myocarditis and patient has been started on  steroid, oxygen supplementation and IV diuretics.  Cardiology actively following, pending clinical improvement  Assessment and Plan:  * Myocarditis (HCC)/myopericarditis Cardiac MRI on 09/15/2022 with findings of myocarditis.  History of non-STEMI S/P cardiac cath 09/10/2022.  COVID-19 and respiratory viral panel negative. 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  Patient  received Solu-Medrol 1000 mg on 09/13/2022, and was on 125 mg IV twice daily.  Steroid has been changed to prednisone 60 mg daily since 8/7 and we will continue tapering doses..  Plan is to continue for 4 to 6 weeks on discharge.  Continue colchicine.   Troponins with downtrend.  Cardiology on board.  Still complains of shortness of breath especially on exertion.  No chest pain will need follow-up with cardiology and oncology as outpatient.  Currently on losartan 25 daily, spironolactone 12.5 daily, Coreg 3.125 mg twice a day and Jardiance 10 mg daily.   Dysphagia,  sticking sensation in the throat.   Gets better in certain positions.  Continue Magic mouthwash, speech and swallow evaluation.  This was modified barium swallow with recommendation of regular diet with thin liquids.  Will get x-ray of the spine to assess for osteophytes.  Fluid overload Mild pleural effusion on CT scan as well as a small amount of simple fluid attenuation ascites throughout abdomen and pelvis.  Lasix has been changed to 40 mg p.o. daily.  Completed course of antibiotic for possible pneumonia.   CRP was 0.7.  Sed rate was elevated at 48.  Procalcitonin at 0.8.  Patient is negative balance for 3155 mL Cardiology following.  Hepatocellular carcinoma IIA  Durvalumab and tremelimumab have been reported to cause myocarditis and pericarditis .  Continue to hold.    On prednisone taper at this time.Oncology saw the patient during hospitalization and will need outpatient oncology follow-up.  Portal vein thrombosis Diagnosed in June 2024.  On Eliquis  Nicotine use disorder.  On nicotine patch.  Chronic hepatitis C without hepatic coma (HCC) Monitor LFTs.     DVT prophylaxis: SCDs Start: 09/13/22 1142 apixaban (ELIQUIS) tablet 5 mg   Code Status:     Code Status: Full Code  Disposition: Likely home with home health, pending PT evaluation.   Status is: Inpatient  Remains inpatient appropriate because: Steroids, diuretics, pending clinical improvement, PT evaluation   Family Communication:  Spoke with the patient's son at bedside on 09/17/2022.[name]  Consultants:  Cardiology Oncology  Procedures:  Cardiac MRI Modified barium swallow  Antimicrobials:  None currently.   Subjective: Today, patient was seen and examined at bedside.  Still complains of mild shortness  of breath on exertion.  No chest pain.   Objective: Vitals:   09/19/22 1027 09/19/22 1028 09/19/22 1036 09/19/22 1205  BP:    108/79  Pulse:    84  Resp: 16 (!) 25 (!) 21 (!) 21  Temp:    97.6 F (36.4 C)  TempSrc:    Oral  SpO2: 100%  100% 99% 96%  Weight:      Height:        Intake/Output Summary (Last 24 hours) at 09/19/2022 1251 Last data filed at 09/19/2022 0300 Gross per 24 hour  Intake 200 ml  Output --  Net 200 ml   Filed Weights   09/17/22 0353 09/18/22 0610 09/19/22 0342  Weight: 63.2 kg 61.7 kg 59.4 kg    Physical Examination: Body mass index is 21.13 kg/m.   General:  Average built, not in obvious distress, on nasal cannula oxygen, elderly female, mildly anxious HENT:   No scleral pallor or icterus noted. Oral mucosa is moist.  Chest:    Diminished breath sounds bilaterally. No crackles or wheezes.  CVS: S1 &S2 heard. No murmur.  Regular rate and rhythm. Abdomen: Soft, nontender, mild right upper quadrant tenderness.  Nondistended.  Bowel sounds are heard.   Extremities: No cyanosis, clubbing or edema.  Peripheral pulses are palpable. Psych: Alert, awake and oriented, normal mood CNS:  No cranial nerve deficits.  Power equal in all extremities.   Skin: Warm and dry.  No rashes noted.  Data Reviewed:   CBC: Recent Labs  Lab 09/13/22 0727 09/13/22 0743 09/14/22 0024 09/16/22 0056 09/16/22 2328 09/18/22 0217 09/19/22 0234  WBC 8.0  --  8.4 11.5* 11.8* 11.5* 11.1*  NEUTROABS 6.8  --   --   --   --   --   --   HGB 12.3   < > 11.7* 11.1* 11.9* 11.7* 11.7*  HCT 37.2   < > 35.3* 34.2* 35.9* 36.0 35.7*  MCV 89.2  --  87.8 89.3 90.0 89.3 88.4  PLT 307  --  324 333 324 337 343   < > = values in this interval not displayed.    Basic Metabolic Panel: Recent Labs  Lab 09/16/22 0056 09/16/22 2328 09/17/22 0915 09/18/22 0217 09/19/22 0234  NA 138 138 137 136 134*  K 3.7 3.4* 3.6 3.3* 4.0  CL 101 102 106 103 104  CO2 22 20* 20* 22 19*  GLUCOSE 137* 110* 144* 97 96  BUN 33* 34* 32* 34* 38*  CREATININE 0.88 0.78 0.77 0.95 0.92  CALCIUM 9.0 9.0 8.9 8.6* 8.7*  MG 2.3 2.1  --  2.2 2.3    Liver Function Tests: Recent Labs  Lab 09/13/22 0727 09/14/22 0024 09/15/22 0208 09/16/22 0056  09/16/22 2328  AST 288* 235* 248* 199* 179*  ALT 263* 236* 418* 383* 458*  ALKPHOS 198* 174* 181* 224* 245*  BILITOT 0.8 1.0 0.7 0.5 0.7  PROT 7.5 6.8 7.2 6.6 7.0  ALBUMIN 3.3* 2.9* 3.1* 3.0* 3.2*     Radiology Studies: DG Swallowing Func-Speech Pathology  Result Date: 09/18/2022 Table formatting from the original result was not included. Images from the original result were not included. Modified Barium Swallow Study Patient Details Name: Christine Hurley MRN: [id number] Date of Birth: 1950-06-26 Today's Date: 09/18/2022 HPI/PMH: HPI: Patient is a 72 y.o. female with PMH:  COPD, HTN, arthritis, depression, hepatitis C, osteopenia of spine, hepatocellular carcinoma-liver mass (s/p 1 treatment with tremelimumab and durvalumab on 7/5/20240). She presented to the ED  on 09/13/22 with three days h/o weakness, SOB on exertion. She was found to have markedly high troponins, cardiology consulted with diagnosis of non-ST elevation MI. On 09/17/22, patient reported to Cardiologist that she was having difficulty swallowing, sensation of food sticking in throat and SLP swallow evaluation ordered. Clinical Impression: Pt presents with functional oropharyngeal swallowing.  There was no penetration or aspiration of any consistencies trialed.  There was pharyngeal stasis of solid textures with deposition of reside on posterior pharyngeal wall, with is suspected to be 2/2 cervical osteophytes (see image below, or image series 13 in video recording of study in PACS), to which pt was sensate.  There was coughing and attempt to bring residual portion of bolus back up before it was cleared with subsequent swallow.  This is very bothersome to pt.  She notes that it helps to turn her head to the side when episodes like this occur, which may result in the realignment of bony projections to the side of the pharynx instead of impinging into the hypopharynx.  Otherwise there was very little pharyngeal residue and bolus flow was not  impeded by potential osteophytes, and she achieves full epiglottic inversion during swallow.  During pill simulation there was brief vallecular stasis of tablet which cleared with subsequent swallow.  On esophageal sweep there was retention of contrast within the esophagus.  Pt is safe to continue current diet, but if symptoms persist and are bothersome, she may benefit from further evaluation to address structual causes of symptoms.  Pt has no further acute ST needs. Recommend regular texture diet with thin liquids.  Recommend choosing softer foods to pt preferece and continuing compensatory strategies (head turn) which pt has already been implementing. Factors that may increase risk of adverse event in presence of aspiration Rubye Oaks [employer] Clearance Coots 2021): No data recorded Recommendations/Plan: Swallowing Evaluation Recommendations Swallowing Evaluation Recommendations Recommendations: PO diet PO Diet Recommendation: Regular; Thin liquids (Level 0) Liquid Administration via: Cup; Straw Medication Administration: Whole meds with liquid (as tolerated) Supervision: Patient able to self-feed Swallowing strategies  : Slow rate; Small bites/sips; Multiple dry swallows after each bite/sip (head turn as needed) Postural changes: Position pt fully upright for meals Oral care recommendations: Oral care BID (2x/day) Recommended consults: -- (consider workup for presumed osteophytes) Treatment Plan Treatment Plan Treatment recommendations: No treatment recommended at this time Follow-up recommendations: No SLP follow up Functional status assessment: Patient has not had a recent decline in their functional status. Treatment frequency: -- (N/A) Recommendations Recommendations for follow up therapy are one component of a multi-disciplinary discharge planning process, led by the attending physician.  Recommendations may be updated based on patient status, additional functional criteria and insurance authorization. Assessment: Orofacial  Exam: Orofacial Exam Oral Cavity: Oral Hygiene: WFL Oral Cavity - Dentition: Adequate natural dentition; Missing dentition Orofacial Anatomy: WFL Oral Motor/Sensory Function: -- (see BSE) Anatomy: Anatomy: Suspected cervical osteophytes Boluses Administered: Boluses Administered Boluses Administered: Thin liquids (Level 0); Mildly thick liquids (Level 2, nectar thick); Moderately thick liquids (Level 3, honey thick); Puree; Solid  Oral Impairment Domain: Oral Impairment Domain Lip Closure: No labial escape Tongue control during bolus hold: Cohesive bolus between tongue to palatal seal Bolus preparation/mastication: Timely and efficient chewing and mashing Bolus transport/lingual motion: Brisk tongue motion Oral residue: Trace residue lining oral structures Location of oral residue : Tongue Initiation of pharyngeal swallow : Posterior angle of the ramus  Pharyngeal Impairment Domain: Pharyngeal Impairment Domain Soft palate elevation: No bolus between soft palate (SP)/pharyngeal wall (PW)  Laryngeal elevation: Complete superior movement of thyroid cartilage with complete approximation of arytenoids to epiglottic petiole Anterior hyoid excursion: Complete anterior movement Epiglottic movement: Complete inversion Laryngeal vestibule closure: Complete, no air/contrast in laryngeal vestibule Pharyngeal stripping wave : Present - complete Pharyngeal contraction (A/P view only): N/A Pharyngoesophageal segment opening: Complete distension and complete duration, no obstruction of flow Tongue base retraction: Trace column of contrast or air between tongue base and PPW Pharyngeal residue: Collection of residue within or on pharyngeal structures Location of pharyngeal residue: Valleculae; Pharyngeal wall  Esophageal Impairment Domain: Esophageal Impairment Domain Esophageal clearance upright position: Esophageal retention Pill: Pill Consistency administered: Thin liquids (Level 0) Thin liquids (Level 0): -- (vallecular stasis)  Penetration/Aspiration Scale Score: Penetration/Aspiration Scale Score 1.  Material does not enter airway: Thin liquids (Level 0); Mildly thick liquids (Level 2, nectar thick); Moderately thick liquids (Level 3, honey thick); Puree; Solid; Pill Compensatory Strategies: Compensatory Strategies Compensatory strategies: Yes Multiple swallows: Effective Effective Multiple Swallows: Pill; Solid Liquid wash: Effective Effective Liquid Wash: Pill   General Information: Caregiver present: No  Diet Prior to this Study: Regular; Thin liquids (Level 0)   Temperature : Normal   No data recorded  Supplemental O2: Nasal cannula   History of Recent Intubation: No  Behavior/Cognition: Alert; Pleasant mood; Cooperative Self-Feeding Abilities: Able to self-feed Baseline vocal quality/speech: Normal No data recorded Volitional Swallow: Able to elicit Exam Limitations: No limitations Goal Planning: Prognosis for improved oropharyngeal function: -- (N/A) No data recorded No data recorded Patient/Family Stated Goal: to be able to breathe better Consulted and agree with results and recommendations: Patient; Physician Pain: Pain Assessment Pain Assessment: Faces Faces Pain Scale: 0 End of Session: Start Time:SLP Start Time (ACUTE ONLY): 1039 Stop Time: SLP Stop Time (ACUTE ONLY): 1056 Time Calculation:SLP Time Calculation (min) (ACUTE ONLY): 17 min Charges: SLP Evaluations $ SLP Speech Visit: 1 Visit SLP Evaluations $MBS Swallow: 1 Procedure SLP visit diagnosis: SLP Visit Diagnosis: Dysphagia, unspecified (R13.10) Past Medical History: Past Medical History: Diagnosis Date  Abnormal nuclear stress test February '13  Small, partially reversible inferolateral ischemia. Marked hypertensive response to exercise. Low risk.  Arthritis   Chest pain with minimal risk for cardiac etiology February 2013  Evaluated with echocardiogram and Myoview, do  COPD (chronic obstructive pulmonary disease) (HCC)   Depression   Hepatitis C   Hypertension    Osteopenia of spine 05/22/2016  PAD (peripheral artery disease) (HCC) 10/01/2021  ABI: monophasic and decreased ABIs. Refer to vascular  Right tibial fracture   Past Surgical History: Past Surgical History: Procedure Laterality Date  DOPPLER ECHOCARDIOGRAPHY  03/26/2011  LVEF>55% normal LV wall thickness, normal LA, mild aortic sclerosis with trace to mild AI, mild to moderate TR, RVSP of , RA pressure about 5 mmHg  LEFT HEART CATH AND CORONARY ANGIOGRAPHY N/A 09/10/2022  Procedure: LEFT HEART CATH AND CORONARY ANGIOGRAPHY;  Surgeon: [place], Peter M, MD;  Location: [facility] INVASIVE CV [facility];  Service: Cardiovascular;  Laterality: N/A;  NM MYOVIEW LTD  03/27/11  post stress ejection fraction is 72%, abnormal myocardial perfusion study, this is a low risk scan  TIBIA FRACTURE SURGERY Right 09/2008  fracture of right lower leg with open reduction and internal fixation Kerrie Pleasure, MA, CCC-SLP Acute Rehabilitation Services Office: [phone] 09/18/2022, 11:44 AM     LOS: 6 days    Joycelyn Das, MD [facility] Available via Epic secure chat 7am-7pm After these hours, please refer to coverage provider listed on [email or web address] 09/19/2022, 12:51 PM

## 2022-09-19 NOTE — Progress Notes (Signed)
D/w Dr. Tyson Babinski and Dr. Bjorn Pippin, we will put in the prednisone taper noted by onc. 60mg  x7>>50mg  x7>>40mg  x 21>>30mg  x7>20mg  x7>>10mg  x7>>5mg  x7  Ulyses Southward, PharmD, BCIDP, AAHIVP, CPP Infectious Disease Pharmacist 09/19/2022 9:08 AM

## 2022-09-19 NOTE — Evaluation (Signed)
Physical Therapy Evaluation Patient Details Name: Christine Hurley MRN: [id number] DOB: [birth year] Today's Date: 09/19/2022  History of Present Illness  Pt is a 72 y/o female admitted 8/3 with progressive SOB, CP with exertional dyspnea and in the ED workup showed worsening troponin, CK as well as new EKG changes. Cardiac MRI on 8/5 found myocarditis.  PMHx  NSTEMI with cardiac cat 7/31, COPD, Hep C, HTN, PAD  Clinical Impression  Pt admitted with/for progressive SOB/CP.  Pt not at her baseline functioning and is significantly deconditioned needing minimal assist for mobility and gait in/around the bed with a RW.  Pt currently limited functionally due to the problems listed below.  (see problems list.)  Pt will benefit from PT to maximize function and safety to be able to get home safely with available assist.         If plan is discharge home, recommend the following: A little help with walking and/or transfers;A little help with bathing/dressing/bathroom   Can travel by private vehicle        Equipment Recommendations None recommended by PT  Recommendations for Other Services       Functional Status Assessment Patient has had a recent decline in their functional status and demonstrates the ability to make significant improvements in function in a reasonable and predictable amount of time.     Precautions / Restrictions Precautions Precautions: Fall Precaution Comments: monitor HR      Mobility  Bed Mobility Overal bed mobility: Needs Assistance Bed Mobility: Supine to Sit, Sit to Supine     Supine to sit: Contact guard Sit to supine: Supervision   General bed mobility comments: effortful, rail for supine to sit via R elbow, no assist sit to supine.    Transfers Overall transfer level: Needs assistance Equipment used: Rolling walker (2 wheels) Transfers: Sit to/from Stand Sit to Stand: Min assist           General transfer comment: cues for hand placement,  stability assist    Ambulation/Gait Ambulation/Gait assistance: Min assist Gait Distance (Feet): 15 Feet Assistive device: Rolling walker (2 wheels) Gait Pattern/deviations: Step-through pattern, Decreased stride length   Gait velocity interpretation: <1.31 ft/sec, indicative of household ambulator   General Gait Details: mildly unsteady in the RW with turns in a confined space.  pt quickly fatiguing, but Sats appeared to be acceptably maintained in mid 90's on [address]  Stairs            Wheelchair Mobility     Tilt Bed    Modified Rankin (Stroke Patients Only)       Balance Overall balance assessment: Needs assistance Sitting-balance support: No upper extremity supported, Feet supported Sitting balance-Leahy Scale: Good     Standing balance support: Single extremity supported, Bilateral upper extremity supported, During functional activity Standing balance-Leahy Scale: Poor                               Pertinent Vitals/Pain Pain Assessment Pain Assessment: No/denies pain    Home Living Family/patient expects to be discharged to:: Private residence Living Arrangements: Spouse/significant other Available Help at Discharge: Family;Available PRN/intermittently (fiance' works until noon) Type of Home: House Home Access: Stairs to enter Entrance Stairs-Rails: Doctor, general practice of Steps: 3   Home Layout: One level Home Equipment: Cane - single Librarian, academic (2 wheels)      Prior Function Prior Level of Function : Independent/Modified Independent  Mobility Comments: using SPC ADLs Comments: Independent with ADL's     Extremity/Trunk Assessment   Upper Extremity Assessment Upper Extremity Assessment: Generalized weakness    Lower Extremity Assessment Lower Extremity Assessment: Generalized weakness    Cervical / Trunk Assessment Cervical / Trunk Assessment: Normal  Communication    Communication Communication: No apparent difficulties  Cognition   Behavior During Therapy: WFL for tasks assessed/performed Overall Cognitive Status: Within Functional Limits for tasks assessed                                          General Comments      Exercises     Assessment/Plan    PT Assessment Patient needs continued PT services  PT Problem List Decreased strength;Decreased activity tolerance;Decreased balance;Decreased mobility;Cardiopulmonary status limiting activity       PT Treatment Interventions Gait training;Stair training;Functional mobility training;Therapeutic activities;Balance training;Patient/family education    PT Goals (Current goals can be found in the Care Plan section)  Acute Rehab PT Goals Patient Stated Goal: I need to get my strength back, more independent PT Goal Formulation: With patient Time For Goal Achievement: 10/03/22 Potential to Achieve Goals: Good    Frequency Min 1X/week     Co-evaluation               AM-PAC PT "6 Clicks" Mobility  Outcome Measure Help needed turning from your back to your side while in a flat bed without using bedrails?: A Little Help needed moving from lying on your back to sitting on the side of a flat bed without using bedrails?: A Little Help needed moving to and from a bed to a chair (including a wheelchair)?: A Little Help needed standing up from a chair using your arms (e.g., wheelchair or bedside chair)?: A Little Help needed to walk in hospital room?: A Little Help needed climbing 3-5 steps with a railing? : A Lot 6 Click Score: 17    End of Session   Activity Tolerance: Patient tolerated treatment well Patient left: in bed;with call bell/phone within reach Nurse Communication: Mobility status PT Visit Diagnosis: Unsteadiness on feet (R26.81);Muscle weakness (generalized) (M62.81);Difficulty in walking, not elsewhere classified (R26.2)    Time: 6578-4696 PT Time  Calculation (min) (ACUTE ONLY): 26 min   Charges:   PT Evaluation $PT Eval Moderate Complexity: 1 Mod PT Treatments $Gait Training: 8-22 mins PT General Charges $$ ACUTE PT VISIT: 1 Visit         09/19/2022  Jacinto Halim., PT Acute Rehabilitation Services [phone]  (office)  Eliseo Gum  09/19/2022, 5:11 PM

## 2022-09-19 NOTE — Progress Notes (Signed)
Progress Note  Patient Name: Christine Hurley Date of Encounter: 09/19/2022  Primary Cardiologist: Chilton Si, MD   Subjective   BP 136/83 this morning.  Creatinine stable at 0.92.  Reports he continues to have some shortness of breath but denies any chest pain.   Inpatient Medications    Scheduled Meds:  apixaban  5 mg Oral BID   carvedilol  3.125 mg Oral BID WC   cilostazol  100 mg Oral BID   colchicine  0.6 mg Oral Daily   furosemide  40 mg Oral Daily   insulin aspart  0-15 Units Subcutaneous TID WC   insulin aspart  0-5 Units Subcutaneous QHS   losartan  25 mg Oral Daily   pantoprazole  40 mg Oral Daily   predniSONE  60 mg Oral Q breakfast   sodium chloride flush  3 mL Intravenous Q12H   spironolactone  12.5 mg Oral Daily   umeclidinium bromide  1 puff Inhalation Daily   Continuous Infusions:   PRN Meds: albuterol, ibuprofen, LORazepam, magic mouthwash, nicotine polacrilex, polyethylene glycol   Vital Signs    Vitals:   09/19/22 0342 09/19/22 0745 09/19/22 0810 09/19/22 0812  BP: 117/82 128/88  136/83  Pulse: 78 84  87  Resp: 20 17  20   Temp: (!) 97.4 F (36.3 C) 97.7 F (36.5 C)  97.7 F (36.5 C)  TempSrc: Oral Oral  Oral  SpO2: 98% 98% 95% 97%  Weight: 59.4 kg     Height:        Intake/Output Summary (Last 24 hours) at 09/19/2022 0857 Last data filed at 09/19/2022 0300 Gross per 24 hour  Intake 200 ml  Output --  Net 200 ml   Filed Weights   09/17/22 0353 09/18/22 0610 09/19/22 0342  Weight: 63.2 kg 61.7 kg 59.4 kg    Telemetry    Sinus rhythm - Personally Reviewed  ECG    Anterior ST elevation on admission - Personally Reviewed  Physical Exam   GEN: No acute distress.   Neck: No JVD Cardiac: RRR, no murmurs, rubs, or gallops.  Respiratory: CTAB GI: Soft, nontender, non-distended  MS: No edema; No deformity. Neuro:  Nonfocal  Psych: Normal affect   Labs    Chemistry Recent Labs  Lab 09/15/22 0208 09/16/22 0056  09/16/22 2328 09/17/22 0915 09/18/22 0217 09/19/22 0234  NA 137 138 138 137 136 134*  K 4.1 3.7 3.4* 3.6 3.3* 4.0  CL 104 101 102 106 103 104  CO2 21* 22 20* 20* 22 19*  GLUCOSE 135* 137* 110* 144* 97 96  BUN 26* 33* 34* 32* 34* 38*  CREATININE 0.76 0.88 0.78 0.77 0.95 0.92  CALCIUM 8.9 9.0 9.0 8.9 8.6* 8.7*  PROT 7.2 6.6 7.0  --   --   --   ALBUMIN 3.1* 3.0* 3.2*  --   --   --   AST 248* 199* 179*  --   --   --   ALT 418* 383* 458*  --   --   --   ALKPHOS 181* 224* 245*  --   --   --   BILITOT 0.7 0.5 0.7  --   --   --   GFRNONAA >60 >60 >60 >60 >60 >60  ANIONGAP 12 15 16* 11 11 11      Hematology Recent Labs  Lab 09/16/22 2328 09/18/22 0217 09/19/22 0234  WBC 11.8* 11.5* 11.1*  RBC 3.99 4.03 4.04  HGB 11.9* 11.7* 11.7*  HCT 35.9*  36.0 35.7*  MCV 90.0 89.3 88.4  MCH 29.8 29.0 29.0  MCHC 33.1 32.5 32.8  RDW 15.8* 15.9* 15.6*  PLT 324 337 343    Cardiac EnzymesNo results for input(s): "TROPONINI" in the last 168 hours. No results for input(s): "TROPIPOC" in the last 168 hours.   BNP Recent Labs  Lab 09/13/22 0841  BNP 278.5*     DDimer No results for input(s): "DDIMER" in the last 168 hours.   Summary of Pertinent studies    TTE: 8/3 ejection fraction is 50 to 55% with anterior, mid, and basal septal hypokinesis  Cardiac cath: 09/10/2022 normal coronary arteries  Imaging: CMR consistent with acute myocarditis, LVEF 35%, RVEF 30%  Labs: TnI peaked 1,521. 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  She was admitted 09/08/2022 with NSTEMI.  Troponin peak 483.  Echo 7/30 showed EF 60 to 65%, normal RV function.  Cath 7/31 showed mild nonobstructive CAD.  She was discharged and presented back on 8/3 with chest pain and troponin elevation  to 1521.  Echo 8/3 shows EF 50 to 55%, mild RV dysfunction.  Suspected checkpoint inhibitor myocarditis, as durvalumab has been associated with myocarditis.  CMR 8/5 consistent with acute myocarditis, LVEF 35%, RVEF 30% -Holding further durvalumab and  tremelimumab -For checkpoint inhibitor myocarditis, recommended treatment is IV Solu-Medrol daily x 3-5 days, then 1mg /kg p.o. prednisone daily, and taper over 4 to 6 weeks.  Seems to be responding to steroids, troponin has been downtrending and she reports chest pain has resolved.  Transitioned to p.o. prednisone 60 mg daily on 8/7 with plans for slow wean over next 6 weeks.  Scheduled cardiology f/u for 8/19 and has oncolofy f/u next week -Suspect component of pericarditis as well, as reports chest pain worsened with deep inspiration or lying flat.  Added colchicine 0.6 mg daily  Acute combined heart failure: in setting of suspected myocarditis as above. Echo on admission showed EF 50-55%.  CMR 8/5 showed LVEF 35%, RVEF 30%.   -Has diuresed well, appears euvolemic.  Transitioned to PO lasix 40 mg daily -Continue losartan 25 mg daily -Continue spironolactone 12.5 mg daily -Continue carvedilol 3.125 mg twice daily -Will add Jardiance 10 mg daily  Portal vein thrombosis: Started on heparin initially, has transitioned to Eliquis  Hepatocellular carcinoma: Oncology following.  Dysphagia: reports having difficulty swallowing pills and eating.  Being evaluated by speech therapy  For questions or updates, please contact [facility] Please consult [email or web address] for contact info under Cardiology/STEMI.      Signed, Christine Ishikawa, MD 09/19/2022, 8:57 AM

## 2022-09-19 NOTE — Progress Notes (Addendum)
1029: Patient stating she can't breathe. SpO2 100% on 2L Secure chat message sent to MD about prn ativan order.  1052: Per hospitalist, ok to give. Prn ativan administered at this time.

## 2022-09-19 NOTE — Plan of Care (Signed)
  Problem: Education: Goal: Understanding of cardiac disease, CV risk reduction, and recovery process will improve Outcome: Progressing Goal: Individualized Educational Video(s) Outcome: Progressing   Problem: Activity: Goal: Ability to tolerate increased activity will improve Outcome: Progressing   Problem: Cardiac: Goal: Ability to achieve and maintain adequate cardiovascular perfusion will improve Outcome: Progressing   Problem: Health Behavior/Discharge Planning: Goal: Ability to safely manage health-related needs after discharge will improve Outcome: Progressing   Problem: Education: Goal: Understanding of CV disease, CV risk reduction, and recovery process will improve Outcome: Progressing Goal: Individualized Educational Video(s) Outcome: Progressing   Problem: Activity: Goal: Ability to return to baseline activity level will improve Outcome: Progressing   Problem: Cardiovascular: Goal: Ability to achieve and maintain adequate cardiovascular perfusion will improve Outcome: Progressing Goal: Vascular access site(s) Level 0-1 will be maintained Outcome: Progressing   Problem: Health Behavior/Discharge Planning: Goal: Ability to safely manage health-related needs after discharge will improve Outcome: Progressing   Problem: Education: Goal: Ability to describe self-care measures that may prevent or decrease complications (Diabetes Survival Skills Education) will improve Outcome: Progressing Goal: Individualized Educational Video(s) Outcome: Progressing   Problem: Coping: Goal: Ability to adjust to condition or change in health will improve Outcome: Progressing   Problem: Fluid Volume: Goal: Ability to maintain a balanced intake and output will improve Outcome: Progressing   Problem: Health Behavior/Discharge Planning: Goal: Ability to identify and utilize available resources and services will improve Outcome: Progressing Goal: Ability to manage health-related  needs will improve Outcome: Progressing   Problem: Metabolic: Goal: Ability to maintain appropriate glucose levels will improve Outcome: Progressing   Problem: Nutritional: Goal: Maintenance of adequate nutrition will improve Outcome: Progressing Goal: Progress toward achieving an optimal weight will improve Outcome: Progressing   Problem: Skin Integrity: Goal: Risk for impaired skin integrity will decrease Outcome: Progressing   Problem: Tissue Perfusion: Goal: Adequacy of tissue perfusion will improve Outcome: Progressing   Problem: Education: Goal: Knowledge of General Education information will improve Description: Including pain rating scale, medication(s)/side effects and non-pharmacologic comfort measures Outcome: Progressing   Problem: Health Behavior/Discharge Planning: Goal: Ability to manage health-related needs will improve Outcome: Progressing   Problem: Clinical Measurements: Goal: Ability to maintain clinical measurements within normal limits will improve Outcome: Progressing Goal: Will remain free from infection Outcome: Progressing Goal: Diagnostic test results will improve Outcome: Progressing Goal: Respiratory complications will improve Outcome: Progressing Goal: Cardiovascular complication will be avoided Outcome: Progressing   Problem: Activity: Goal: Risk for activity intolerance will decrease Outcome: Progressing   Problem: Nutrition: Goal: Adequate nutrition will be maintained Outcome: Progressing   Problem: Coping: Goal: Level of anxiety will decrease Outcome: Progressing   Problem: Elimination: Goal: Will not experience complications related to bowel motility Outcome: Progressing   Problem: Safety: Goal: Ability to remain free from injury will improve Outcome: Progressing   Problem: Skin Integrity: Goal: Risk for impaired skin integrity will decrease Outcome: Progressing

## 2022-09-19 NOTE — Plan of Care (Signed)
  Problem: Elimination: Goal: Will not experience complications related to urinary retention Outcome: Completed/Met   Problem: Pain Managment: Goal: General experience of comfort will improve Outcome: Completed/Met

## 2022-09-20 DIAGNOSIS — E8770 Fluid overload, unspecified: Secondary | ICD-10-CM

## 2022-09-20 DIAGNOSIS — B182 Chronic viral hepatitis C: Secondary | ICD-10-CM | POA: Diagnosis not present

## 2022-09-20 DIAGNOSIS — I408 Other acute myocarditis: Secondary | ICD-10-CM | POA: Diagnosis not present

## 2022-09-20 DIAGNOSIS — I5041 Acute combined systolic (congestive) and diastolic (congestive) heart failure: Secondary | ICD-10-CM | POA: Diagnosis not present

## 2022-09-20 DIAGNOSIS — F17219 Nicotine dependence, cigarettes, with unspecified nicotine-induced disorders: Secondary | ICD-10-CM | POA: Diagnosis not present

## 2022-09-20 LAB — GLUCOSE, CAPILLARY
Glucose-Capillary: 87 mg/dL (ref 70–99)
Glucose-Capillary: 129 mg/dL — ABNORMAL HIGH (ref 70–99)

## 2022-09-20 LAB — TROPONIN I (HIGH SENSITIVITY): Troponin I (High Sensitivity): 56 ng/L — ABNORMAL HIGH (ref <18)

## 2022-09-20 MED ORDER — LOSARTAN POTASSIUM 25 MG PO TABS: 25.0000 mg | ORAL_TABLET | Freq: Every day | ORAL | 2 refills | Status: DC

## 2022-09-20 MED ORDER — HYDROXYZINE HCL 10 MG PO TABS: 10.0000 mg | ORAL_TABLET | Freq: Every evening | ORAL | 0 refills | Status: DC | PRN

## 2022-09-20 MED ORDER — NYSTATIN 100000 UNIT/ML MT SUSP: 5.0000 mL | Freq: Four times a day (QID) | OROMUCOSAL | 0 refills | Status: AC | PRN

## 2022-09-20 MED ORDER — NICOTINE POLACRILEX 2 MG MT GUM: 2.0000 mg | CHEWING_GUM | OROMUCOSAL | 0 refills | Status: DC | PRN

## 2022-09-20 MED ORDER — PANTOPRAZOLE SODIUM 40 MG PO TBEC: 40.0000 mg | DELAYED_RELEASE_TABLET | Freq: Every day | ORAL | 1 refills | Status: DC

## 2022-09-20 MED ORDER — CARVEDILOL 3.125 MG PO TABS: 3.1250 mg | ORAL_TABLET | Freq: Two times a day (BID) | ORAL | 2 refills | Status: DC

## 2022-09-20 MED ORDER — COLCHICINE 0.6 MG PO TABS: 0.6000 mg | ORAL_TABLET | Freq: Every day | ORAL | 0 refills | Status: AC

## 2022-09-20 MED ORDER — SPIRIVA RESPIMAT 2.5 MCG/ACT IN AERS: 2.0000 | INHALATION_SPRAY | Freq: Every day | RESPIRATORY_TRACT | 1 refills | Status: DC

## 2022-09-20 MED ORDER — EMPAGLIFLOZIN 10 MG PO TABS: 10.0000 mg | ORAL_TABLET | Freq: Every day | ORAL | 2 refills | Status: DC

## 2022-09-20 MED ORDER — FUROSEMIDE 40 MG PO TABS: 40.0000 mg | ORAL_TABLET | Freq: Every day | ORAL | 2 refills | Status: DC

## 2022-09-20 MED ORDER — SPIRONOLACTONE 25 MG PO TABS: 12.5000 mg | ORAL_TABLET | Freq: Every day | ORAL | 2 refills | Status: DC

## 2022-09-20 MED ORDER — PREDNISONE 10 MG PO TABS: ORAL_TABLET | ORAL | 0 refills | Status: DC

## 2022-09-20 NOTE — Progress Notes (Addendum)
Patient informed of discharge order. She stated that she will call her ride and the person will be able to pick her up at 2pm. While primary nurse was preparing discharge paperwork, patient pressed call button and stated she would like to speak with nurse. Upon entering the room, patient stated her ride will not be able to come today, but tomorrow. Hospitalist and case manager informed. Taxi cab voucher offer. Patient on phone with family member Christine Hurley at this time. Both patient and Christine Hurley informed that patient is medically ready for discharge. Informed that taxi cab voucher can be arranged. Per Lynden Ang "she didn't come to hospital in no damn cab!" When asked if Lynden Ang will be able to pick her up, she stated she would. Before leaving room, Christine Hurley stated " I will be calling back who I spoke to earlier today."

## 2022-09-20 NOTE — Progress Notes (Signed)
SATURATION QUALIFICATIONS: (This note is used to comply with regulatory documentation for home oxygen)  Patient Saturations on Room Air at Rest = 93%  Patient Saturations on Room Air while Ambulating = 92-96%  Please briefly explain why patient needs home oxygen:  Patient does not qualify for home oxygen. Successfully weaned to room air this morning. Saturations stayed in the 90s while ambulating. Patient return to room with SpO2 at 96% on room air.

## 2022-09-20 NOTE — Progress Notes (Signed)
Physical Therapy Treatment Patient Details Name: Christine Hurley MRN: [id number] DOB: Sep 16, 1950 Today's Date: 09/20/2022   History of Present Illness Pt is a 72 y/o female admitted 8/3 with progressive SOB, CP with exertional dyspnea and in the ED workup showed worsening troponin, CK as well as new EKG changes. Cardiac MRI on 8/5 found myocarditis.  PMHx  NSTEMI with cardiac cat 7/31, COPD, Hep C, HTN, PAD    PT Comments  Pt still quite fatigued even before mobilizing, but was able to ambulate a short distance with the RW and stand by/CG assist and was minimally assisted up/down 3 steps with a rail and stability>boost assist for each step.  Pt's fiance' around for the weekend and will be able to "feel out" pt's needs before he has to work on Monday.    If plan is discharge home, recommend the following: A little help with walking and/or transfers;A little help with bathing/dressing/bathroom;Help with stairs or ramp for entrance   Can travel by private vehicle        Equipment Recommendations  None recommended by PT    Recommendations for Other Services       Precautions / Restrictions Precautions Precautions: Fall Precaution Comments: monitor HR Restrictions Weight Bearing Restrictions: No     Mobility  Bed Mobility Overal bed mobility: Needs Assistance Bed Mobility: Supine to Sit, Sit to Supine     Supine to sit: Supervision Sit to supine: Supervision   General bed mobility comments: easier transition to EOB, no rail, no assist    Transfers Overall transfer level: Needs assistance Equipment used: 1 person hand held assist Transfers: Sit to/from Stand, Bed to chair/wheelchair/BSC Sit to Stand: Supervision Stand pivot transfers: Contact guard assist         General transfer comment: appropriate use of UE's for safety    Ambulation/Gait Ambulation/Gait assistance: Contact guard assist Gait Distance (Feet): 30 Feet Assistive device: Rolling walker (2 wheels), 1  person hand held assist Gait Pattern/deviations: Step-through pattern, Decreased stride length Gait velocity: decreased Gait velocity interpretation: <1.8 ft/sec, indicate of risk for recurrent falls   General Gait Details: less unsteady today, slower with no knee instability.   Stairs Stairs: Yes Stairs assistance: Min assist Stair Management: One rail Right, Step to pattern, Alternating pattern, Forwards Number of Stairs: 3 General stair comments: tentative and mildly weak.  Definite need for min stability>boost assist.  Cues for sequencing.   Wheelchair Mobility     Tilt Bed    Modified Rankin (Stroke Patients Only)       Balance Overall balance assessment: Needs assistance Sitting-balance support: No upper extremity supported, Feet supported Sitting balance-Leahy Scale: Good     Standing balance support: Single extremity supported, Bilateral upper extremity supported Standing balance-Leahy Scale: Poor                              Cognition Arousal: Alert Behavior During Therapy: WFL for tasks assessed/performed Overall Cognitive Status: Within Functional Limits for tasks assessed                                          Exercises      General Comments General comments (skin integrity, edema, etc.): Sats on RA in the lower 90% range,  HR in the 90's bpm with mobility      Pertinent Vitals/Pain Pain  Assessment Pain Assessment: No/denies pain    Home Living                          Prior Function            PT Goals (current goals can now be found in the care plan section) Acute Rehab PT Goals Patient Stated Goal: I need to get my strength back, more independent PT Goal Formulation: With patient Time For Goal Achievement: 10/03/22 Potential to Achieve Goals: Good Progress towards PT goals: Progressing toward goals    Frequency    Min 1X/week      PT Plan      Co-evaluation               AM-PAC PT "6 Clicks" Mobility   Outcome Measure  Help needed turning from your back to your side while in a flat bed without using bedrails?: A Little Help needed moving from lying on your back to sitting on the side of a flat bed without using bedrails?: A Little Help needed moving to and from a bed to a chair (including a wheelchair)?: A Little Help needed standing up from a chair using your arms (e.g., wheelchair or bedside chair)?: A Little Help needed to walk in hospital room?: A Little Help needed climbing 3-5 steps with a railing? : A Little 6 Click Score: 18    End of Session   Activity Tolerance: Patient tolerated treatment well;Patient limited by fatigue Patient left: in bed;with call bell/phone within reach Nurse Communication: Mobility status PT Visit Diagnosis: Unsteadiness on feet (R26.81)     Time: 0981-1914 PT Time Calculation (min) (ACUTE ONLY): 25 min  Charges:    $Gait Training: 8-22 mins $Therapeutic Activity: 8-22 mins PT General Charges $$ ACUTE PT VISIT: 1 Visit                     09/20/2022  Jacinto Halim., PT Acute Rehabilitation Services [phone]  (office)   Eliseo Gum  09/20/2022, 1:16 PM

## 2022-09-20 NOTE — TOC Transition Note (Signed)
Transition of Care [facility]) - CM/SW Discharge Note   Patient Details  Name: Christine Hurley MRN: [id number] Date of Birth: [birth year]  [facility] CM/SW Contact:  Lawerance Sabal, RN Phone Number: 09/20/2022, 3:00 PM   Clinical Narrative:     Patient states her son is on his way to get her.  She is now agreeable to [facility] services, has no preference for provider, [facility]urbish able to accept and HH orders have been placed.   Final next level of care: Home w Home Health Services Barriers to Discharge: No Barriers Identified   Patient Goals and CMS Choice   Choice offered to / list presented to : NA  Discharge Placement                         Discharge Plan and Services Additional resources added to the After Visit Summary for   In-house Referral: NA Discharge Planning Services: CM Consult Post Acute Care Choice: NA          DME Arranged: N/A DME Agency: NA       HH Arranged: PT, OT HH Agency: [facility] Date [facility] Agency Contacted: 09/20/22 Time HH Agency Contacted: 1459 Representative spoke with at [facility] Agency: Kandee Keen  Social Determinants of Health (SDOH) Interventions SDOH Screenings   Food Insecurity: No Food Insecurity (09/20/2022)  Housing: Low Risk  (09/20/2022)  Transportation Needs: No Transportation Needs (09/20/2022)  Utilities: Not At Risk (09/20/2022)  Depression (PHQ2-9): Low Risk  (07/28/2022)  Financial Resource Strain: Low Risk  (03/13/2022)  Physical Activity: Inactive (03/13/2022)  Social Connections: Moderately Isolated (03/13/2022)  Stress: No Stress Concern Present (03/13/2022)  Tobacco Use: High Risk (09/13/2022)     Readmission Risk Interventions    09/16/2022    8:11 AM  Readmission Risk Prevention Plan  Transportation Screening Complete  PCP or Specialist Appt within 3-5 Days Complete  HRI or Home Care Consult Complete  Social Work Consult for Recovery Care Planning/Counseling --  Palliative Care Screening Not Applicable  Medication  Review Oceanographer) Complete

## 2022-09-20 NOTE — Progress Notes (Signed)
Progress Note  Patient Name: Christine Hurley Date of Encounter: 09/20/2022  Primary Cardiologist: Chilton Si, MD   Subjective   BP 136/83 this morning.  Creatinine stable at 0.92.  Reports he continues to have some shortness of breath but denies any chest pain.   Inpatient Medications    Scheduled Meds:  apixaban  5 mg Oral BID   carvedilol  3.125 mg Oral BID WC   cilostazol  100 mg Oral BID   colchicine  0.6 mg Oral Daily   empagliflozin  10 mg Oral Daily   furosemide  40 mg Oral Daily   insulin aspart  0-15 Units Subcutaneous TID WC   insulin aspart  0-5 Units Subcutaneous QHS   losartan  25 mg Oral Daily   pantoprazole  40 mg Oral Daily   [START ON 09/24/2022] predniSONE  50 mg Oral Q breakfast   Followed by   Melene Muller ON 10/01/2022] predniSONE  40 mg Oral Q breakfast   Followed by   Melene Muller ON 10/22/2022] predniSONE  30 mg Oral Q breakfast   Followed by   Melene Muller ON 10/29/2022] predniSONE  20 mg Oral Q breakfast   Followed by   Melene Muller ON 11/05/2022] predniSONE  10 mg Oral Q breakfast   Followed by   Melene Muller ON 11/12/2022] predniSONE  5 mg Oral Q breakfast   predniSONE  60 mg Oral Q breakfast   sodium chloride flush  3 mL Intravenous Q12H   spironolactone  12.5 mg Oral Daily   umeclidinium bromide  1 puff Inhalation Daily   Continuous Infusions:   PRN Meds: albuterol, ibuprofen, LORazepam, magic mouthwash, nicotine polacrilex, polyethylene glycol   Vital Signs    Vitals:   09/20/22 0724 09/20/22 0736 09/20/22 0738 09/20/22 0817  BP: 126/82     Pulse: 90     Resp: (!) 24 19 (!) 22 (!) 23  Temp: (!) 97.4 F (36.3 C)     TempSrc: Oral     SpO2: 97% 98% 99% 100%  Weight:      Height:       No intake or output data in the 24 hours ending 09/20/22 1007  Filed Weights   09/18/22 0610 09/19/22 0342 09/20/22 0452  Weight: 61.7 kg 59.4 kg 60.5 kg    Telemetry    Sinus rhythm - Personally Reviewed  ECG    Anterior ST elevation on admission - Personally  Reviewed  Physical Exam   GEN: No acute distress.   Neck: No JVD Cardiac: RRR, no murmurs, rubs, or gallops.  Respiratory: CTAB GI: Soft, nontender, non-distended  MS: No edema; No deformity. Neuro:  Nonfocal  Psych: Normal affect   Labs    Chemistry Recent Labs  Lab 09/15/22 0208 09/16/22 0056 09/16/22 2328 09/17/22 0915 09/18/22 0217 09/19/22 0234 09/20/22 0206  NA 137 138 138   < > 136 134* 133*  K 4.1 3.7 3.4*   < > 3.3* 4.0 3.9  CL 104 101 102   < > 103 104 103  CO2 21* 22 20*   < > 22 19* 20*  GLUCOSE 135* 137* 110*   < > 97 96 101*  BUN 26* 33* 34*   < > 34* 38* 39*  CREATININE 0.76 0.88 0.78   < > 0.95 0.92 0.92  CALCIUM 8.9 9.0 9.0   < > 8.6* 8.7* 8.7*  PROT 7.2 6.6 7.0  --   --   --   --   ALBUMIN 3.1* 3.0* 3.2*  --   --   --   --  AST 248* 199* 179*  --   --   --   --   ALT 418* 383* 458*  --   --   --   --   ALKPHOS 181* 224* 245*  --   --   --   --   BILITOT 0.7 0.5 0.7  --   --   --   --   GFRNONAA >60 >60 >60   < > >60 >60 >60  ANIONGAP 12 15 16*   < > 11 11 10    < > = values in this interval not displayed.     Hematology Recent Labs  Lab 09/18/22 0217 09/19/22 0234 09/20/22 0206  WBC 11.5* 11.1* 12.2*  RBC 4.03 4.04 4.18  HGB 11.7* 11.7* 12.2  HCT 36.0 35.7* 37.5  MCV 89.3 88.4 89.7  MCH 29.0 29.0 29.2  MCHC 32.5 32.8 32.5  RDW 15.9* 15.6* 15.7*  PLT 337 343 354    Cardiac EnzymesNo results for input(s): "TROPONINI" in the last 168 hours. No results for input(s): "TROPIPOC" in the last 168 hours.   BNP No results for input(s): "BNP", "PROBNP" in the last 168 hours.    DDimer No results for input(s): "DDIMER" in the last 168 hours.   Summary of Pertinent studies    TTE: 8/3 ejection fraction is 50 to 55% with anterior, mid, and basal septal hypokinesis  Cardiac cath: 09/10/2022 normal coronary arteries  Imaging: CMR consistent with acute myocarditis, LVEF 35%, RVEF 30%  Labs: TnI peaked 1,521. 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  She was admitted 09/08/2022 with NSTEMI.  Troponin peak 483.  Echo 7/30 showed EF 60 to 65%, normal RV function.  Cath 7/31 showed mild nonobstructive CAD.  She was discharged and presented back on 8/3 with chest pain and troponin elevation to 1521.  Echo 8/3 shows EF 50 to 55%, mild RV dysfunction.  Suspected checkpoint inhibitor myocarditis, as durvalumab has been associated with myocarditis.  CMR 8/5 consistent with acute myocarditis, LVEF 35%, RVEF 30% -Holding further durvalumab and  tremelimumab -For checkpoint inhibitor myocarditis, recommended treatment is IV Solu-Medrol daily x 3-5 days, then 1mg /kg p.o. prednisone daily, and taper over 4 to 6 weeks.  Seems to be responding to steroids, troponin has been downtrending and she reports chest pain has resolved.  Transitioned to p.o. prednisone 60 mg daily on 8/7 with plans for slow wean over next 6 weeks.  Scheduled cardiology f/u for 8/19 and has oncology f/u next week -Suspect component of pericarditis as well, as reports chest pain worsened with deep inspiration or lying flat.  Added colchicine 0.6 mg daily - can continue this for a week.  Acute combined heart failure: in setting of suspected myocarditis as above. Echo on admission showed EF 50-55%.  CMR 8/5 showed LVEF 35%, RVEF 30%.   -Has diuresed well, appears euvolemic.  Transitioned to PO lasix 40 mg daily -Continue losartan 25 mg daily -Continue spironolactone 12.5 mg daily -Continue carvedilol 3.125 mg twice daily -Continue Jardiance 10 mg daily  Portal vein thrombosis: Started on heparin initially, has transitioned to Eliquis  Hepatocellular carcinoma: Oncology following.  Dysphagia: reports having difficulty swallowing pills and eating.  Being  evaluated by speech therapy  From a cardiac perspective, I think  we have achieve maximum in-hospital benefit. We will continue to follow through her admission, but from my standpoint, she is suitable for discharge.   For questions or updates, please contact [facility] Please consult [email or web address] for contact info under Cardiology/STEMI.      Signed, Maurice Small, MD 09/20/2022, 10:07 AM

## 2022-09-20 NOTE — Progress Notes (Signed)
Primary nurse and case manager at bedside with patient. Patient stated that she get panic attacks while here in the hospital because she can't get comfortable. States she wants to go home. Patient refused taxi cab voucher. Reports her son will pick her up. Agreeable to [facility] services at this time.

## 2022-09-20 NOTE — Discharge Summary (Signed)
Physician Discharge Summary  Christine Hurley DGU:[id number] DOB: Sep 15, 1950 DOA: 09/13/2022  PCP: Willow Ora, MD  Admit date: 09/13/2022 Discharge date: 09/21/2022  Admitted From: Home  Discharge disposition: Home with home health  Recommendations for Outpatient Follow-Up:   Follow up with your primary care provider in one week.  Check CBC, BMP, magnesium in the next visit Follow-up with oncology as outpatient Follow-up with cardiology as has been scheduled on 09/29/2022.  Discharge Diagnosis:   Principal Problem:   Myocarditis (HCC) Active Problems:   Chronic hepatitis C without hepatic coma (HCC)   Cigarette nicotine dependence with nicotine-induced disorder   Portal vein thrombosis   Hepatocellular carcinoma (HCC)   Fluid overload   Acute combined systolic and diastolic heart failure (HCC)   Discharge Condition: Improved.  Diet recommendation: Low sodium, heart healthy.    Wound care: None.  Code status: Full.   History of Present Illness:   Christine Hurley is a 72 y.o. female with medical history significant of hepatocellular carcinoma-liver mass (s/p 1 treatment with tremelimumab and durvalumab on 7/5/20240 who was discharged from [facility] one day piror to presentation for suspected non-ST elevation MI.  She did have angiogram done and there was no findings for severe CAD so no intervention was done.  After she went home she again complained of chest pain with exertional dyspnea followed by progressive shortness of breath to the extent of shortness of breath at rest.  In the ED, workup showed worsening troponin, CK  as well as new EKG changes.  Cardiology and patient's primary oncologist has seen the patient and have determined the patient may have pericarditis/ myocarditis and patient was been started on  steroid, oxygen supplementation and IV diuretics.     Hospital Course:   Following conditions were addressed during hospitalization as listed  below,  Myocarditis (HCC)/myopericarditis Cardiac MRI on 09/15/2022 with findings of myocarditis.  History of non-STEMI S/P cardiac cath 09/10/2022.  COVID-19 and respiratory viral panel negative. 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  Patient  received Solu-Medrol 1000 mg on 09/13/2022, and was on 125 mg IV twice daily.  Steroid has been changed to prednisone 60 mg daily since 8/7 and we will continue tapering doses for 4 to 6 weeks on discharge. Continue colchicine on discharge..  Continue on losartan 25 daily, spironolactone 12.5 daily, Coreg 3.125 mg twice a day and Jardiance 10 mg daily.    Dysphagia,  sticking sensation in the throat.  Gets better in certain positions.  Seen by speech therapy and modified barium swallow was done.  Advised regular diet.    Fluid overload Considered at this time.  Off supplemental oxygen.  Mild pleural effusion on CT scan as well as a small amount of simple fluid attenuation ascites throughout abdomen and pelvis.  Received IV Lasix followed by oral Lasix on discharge.  Completed course of antibiotic for possible pneumonia.   CRP was 0.7.  Sed rate was elevated at 48.  Procalcitonin at 0.8.     Hepatocellular carcinoma IIA  Durvalumab and tremelimumab have been reported to cause myocarditis and pericarditis .  Continue to hold.    On prednisone taper on discharge..Oncology saw the patient during hospitalization and will need outpatient oncology follow-up.   Portal vein thrombosis Diagnosed in June 2024.  On Eliquis   Nicotine use disorder.  Received nicotine gum.  Will continue nicotine gums on discharge.   Chronic hepatitis C without hepatic coma (HCC) Monitor LFTs as outpatient.[name]  Disposition.  At this time, patient is stable for disposition home with home health with outpatient cardiology, oncology and PCP follow-up.  Spoke with the patient's family prior to disposition.  Medical Consultants:    Cardiology Oncology  Procedures:    Cardiac MRI Modified barium swallow Subjective:   Today, patient was seen and examined at bedside.  Denies any chest pain feels little fatigued but was able to ambulate without need for oxygen.  Discharge Exam:   Vitals:   09/20/22 0817 09/20/22 1103  BP:  109/74  Pulse:  86  Resp: (!) 23 (!) 24  Temp:  97.6 F (36.4 C)  SpO2: 100% 94%   Vitals:   09/20/22 0736 09/20/22 0738 09/20/22 0817 09/20/22 1103  BP:    109/74  Pulse:    86  Resp: 19 (!) 22 (!) 23 (!) 24  Temp:    97.6 F (36.4 C)  TempSrc:    Oral  SpO2: 98% 99% 100% 94%  Weight:      Height:       Body mass index is 21.53 kg/m.   General: Alert awake, not in obvious distress, mildly anxious, HENT: pupils equally reacting to light,  No scleral pallor or icterus noted. Oral mucosa is moist.  Chest:  Clear breath sounds.  Diminished breath sounds bilaterally. No crackles or wheezes.  CVS: S1 &S2 heard. No murmur.  Regular rate and rhythm. Abdomen: Soft, right upper quadrant nonspecific tenderness.  Nondistended.  Bowel sounds are heard.   Extremities: No cyanosis, clubbing or edema.  Peripheral pulses are palpable. Psych: Alert, awake and oriented, normal mood CNS:  No cranial nerve deficits.  Power equal in all extremities.   Skin: Warm and dry.  No rashes noted.  The results of significant diagnostics from this hospitalization (including imaging, microbiology, ancillary and laboratory) are listed below for reference.     Diagnostic Studies:   DG CHEST PORT 1 VIEW  Result Date: 09/13/2022 CLINICAL DATA:  Dyspnea, shortness of breath. EXAM: PORTABLE CHEST 1 VIEW COMPARISON:  Chest radiograph and CT earlier today FINDINGS: Stable low lung volumes. Unchanged heart size and mediastinal contours. Bibasilar atelectasis. Minimal right pleural effusion. No pneumothorax. IMPRESSION: Low lung volumes with bibasilar atelectasis. Left lung base atelectasis has increased from  earlier today. Minimal right pleural effusion. Electronically Signed   By: Narda Rutherford M.D.   On: 09/13/2022 18:09   ECHOCARDIOGRAM LIMITED  Result Date: 09/13/2022    ECHOCARDIOGRAM LIMITED REPORT   [name] Date of Exam: 09/13/2022 Medical Rec #:  [id number]       Height:       66.0 in Accession #:    [id number]      Weight:       140.0 lb Date of Birth:  [birth year]       BSA:          1.719 m Patient Age:    72 years        BP:           136/84 mmHg Patient Gender: F               HR:           98 bpm. Exam Location:  Inpatient Procedure: Limited Echo and Limited Color Doppler Indications:     Chest Pain R07.9  History:         Patient has prior history of Echocardiogram examinations, most  recent 09/09/2022. CAD and Previous Myocardial Infarction, PAD,                  Arrythmias:Tachycardia; Risk Factors:Hypertension, Current                  Smoker and Dyslipidemia.  Sonographer:     Lucendia Herrlich Referring Phys:  [id number] Perlie Gold Diagnosing Phys: Weston Brass MD IMPRESSIONS  1. Left ventricular ejection fraction, by estimation, is 50 to 55%. The left ventricle has low normal function. The left ventricle demonstrates regional wall motion abnormalities (see scoring diagram/findings for description).  2. Right ventricular systolic function is mildly reduced. The right ventricular size is normal. The estimated right ventricular systolic pressure is 28.4 mmHg.  3. The mitral valve is grossly normal. Trivial mitral valve regurgitation.  4. The aortic valve is grossly normal. Aortic valve regurgitation is trivial.  5. The inferior vena cava is dilated in size with <50% respiratory variability, suggesting right atrial pressure of 15 mmHg. FINDINGS  Left Ventricle: Left ventricular ejection fraction, by estimation, is 50 to 55%. The left ventricle has low normal function. The left ventricle demonstrates regional wall motion abnormalities. There is borderline left  ventricular hypertrophy.  LV Wall Scoring: The anterior septum, mid inferoseptal segment, and basal inferoseptal segment are hypokinetic. Right Ventricle: The right ventricular size is normal. Right ventricular systolic function is mildly reduced. The tricuspid regurgitant velocity is 1.83 m/s, and with an assumed right atrial pressure of 15 mmHg, the estimated right ventricular systolic pressure is 28.4 mmHg. Pericardium: Trivial pericardial effusion is present. Mitral Valve: The mitral valve is grossly normal. Trivial mitral valve regurgitation. Tricuspid Valve: The tricuspid valve is grossly normal. Tricuspid valve regurgitation is trivial. Aortic Valve: The aortic valve is grossly normal. Aortic valve regurgitation is trivial. Venous: The inferior vena cava is dilated in size with less than 50% respiratory variability, suggesting right atrial pressure of 15 mmHg. LEFT VENTRICLE PLAX 2D LVIDd:         4.00 cm LVIDs:         2.60 cm LV PW:         0.90 cm LV IVS:        1.00 cm  IVC IVC diam: 2.30 cm LEFT ATRIUM         Index LA diam:    3.40 cm 1.98 cm/m  TRICUSPID VALVE TR Peak grad:   13.4 mmHg TR Vmax:        183.00 cm/s Weston Brass MD Electronically signed by Weston Brass MD Signature Date/Time: 09/13/2022/11:20:31 AM    Final (Updated)    CT Angio Chest/Abd/Pel for Dissection W and/or Wo Contrast  Result Date: 09/13/2022 CLINICAL DATA:  Shortness of breath, recent NSTEMI and cardiac catheterization, suspected multifocal hepatocellular carcinoma * Tracking Code: BO * EXAM: CT ANGIOGRAPHY CHEST, ABDOMEN AND PELVIS TECHNIQUE: Non-contrast CT of the chest was initially obtained. Multidetector CT imaging through the chest, abdomen and pelvis was performed using the standard protocol during bolus administration of intravenous contrast. Multiplanar reconstructed images and MIPs were obtained and reviewed to evaluate the vascular anatomy. RADIATION DOSE REDUCTION: This exam was performed according to the  departmental dose-optimization program which includes automated exposure control, adjustment of the mA and/or kV according to patient size and/or use of iterative reconstruction technique. CONTRAST:  OMNIPAQUE IOHEXOL 350 MG/ML SOLN COMPARISON:  CT chest angiogram, 09/08/2022, CT chest abdomen pelvis angiogram, 07/18/2022 MR abdomen, 07/19/2018 FINDINGS: CTA CHEST FINDINGS VASCULAR Aorta: Satisfactory opacification of the aorta.  Normal contour and caliber of the thoracic aorta. No evidence of aneurysm, dissection, or other acute aortic pathology. Moderate mixed calcific atherosclerosis. Cardiovascular: No evidence of pulmonary embolism on limited non-tailored examination. Mild cardiomegaly. Left and right coronary artery calcifications. No pericardial effusion. Review of the MIP images confirms the above findings. NON VASCULAR Mediastinum/Nodes: No enlarged mediastinal, hilar, or axillary lymph nodes. Thyroid gland, trachea, and esophagus demonstrate no significant findings. Lungs/Pleura: New small right pleural effusion. Similar dependent bibasilar atelectasis or consolidation. Musculoskeletal: No chest wall abnormality. No acute osseous findings. Review of the MIP images confirms the above findings. CTA ABDOMEN AND PELVIS FINDINGS VASCULAR Normal contour and caliber of the abdominal aorta. No evidence of aneurysm, dissection, or other acute aortic pathology. Standard branching pattern of the abdominal aorta with solitary bilateral renal arteries. Moderate mixed calcific atherosclerosis. Review of the MIP images confirms the above findings. NON-VASCULAR Hepatobiliary: Coarse, nodular cirrhotic morphology of the liver, again with multiple hyperenhancing lesions throughout the liver and extensive portal venous thrombus, better assessed by prior MR (series 11, image 124). No gallstones, gallbladder wall thickening, or biliary dilatation. Pancreas: Unremarkable. No pancreatic ductal dilatation or surrounding  inflammatory changes. Spleen: Normal in size without significant abnormality. Adrenals/Urinary Tract: Adrenal glands are unremarkable. Kidneys are normal, without renal calculi, solid lesion, or hydronephrosis. Bladder is unremarkable. Stomach/Bowel: Stomach is within normal limits. Appendix appears normal. No evidence of bowel wall thickening, distention, or inflammatory changes. Lymphatic: No enlarged abdominal or pelvic lymph nodes. Reproductive: No mass or other significant abnormality. Other: No abdominal wall hernia or abnormality. Moderate volume simple fluid attenuation ascites throughout the abdomen and pelvis. Musculoskeletal: No acute osseous findings. IMPRESSION: 1. No evidence of aortic aneurysm, dissection, or other acute aortic pathology. 2. No evidence of pulmonary embolism on limited non-tailored examination. 3. New small right pleural effusion. Similar dependent bibasilar atelectasis or consolidation. 4. Coarse, nodular cirrhotic morphology of the liver, again with multiple hyperenhancing lesions throughout the liver and extensive portal venous thrombus, better assessed by prior MR and presumably reflecting multifocal hepatocellular carcinoma. 5. Moderate volume simple fluid attenuation ascites throughout the abdomen and pelvis. 6. Coronary artery disease. Aortic Atherosclerosis (ICD10-I70.0). Electronically Signed   By: Jearld Lesch M.D.   On: 09/13/2022 08:39   DG Chest Port 1 View  Result Date: 09/13/2022 CLINICAL DATA:  Code STEMI EXAM: PORTABLE CHEST 1 VIEW COMPARISON:  09/08/2022 radiograph and chest CT FINDINGS: Low volume chest with hazy density at the bases, greater on the right. Aortic tortuosity accentuated by rotation, unchanged. No edema, effusion, or pneumothorax. IMPRESSION: Low volume chest with atelectasis at the bases, stable from recent chest CT. Electronically Signed   By: Tiburcio Pea M.D.   On: 09/13/2022 08:12     Labs:   Basic Metabolic Panel: Recent Labs  Lab  09/16/22 0056 09/16/22 2328 09/17/22 0915 09/18/22 0217 09/19/22 0234 09/20/22 0206  NA 138 138 137 136 134* 133*  K 3.7 3.4* 3.6 3.3* 4.0 3.9  CL 101 102 106 103 104 103  CO2 22 20* 20* 22 19* 20*  GLUCOSE 137* 110* 144* 97 96 101*  BUN 33* 34* 32* 34* 38* 39*  CREATININE 0.88 0.78 0.77 0.95 0.92 0.92  CALCIUM 9.0 9.0 8.9 8.6* 8.7* 8.7*  MG 2.3 2.1  --  2.2 2.3 2.5*   GFR Estimated Creatinine Clearance: 51.7 mL/min (by C-G formula based on SCr of 0.92 mg/dL). Liver Function Tests: Recent Labs  Lab 09/15/22 0208 09/16/22 0056 09/16/22 2328  AST 248* 199*  179*  ALT 418* 383* 458*  ALKPHOS 181* 224* 245*  BILITOT 0.7 0.5 0.7  PROT 7.2 6.6 7.0  ALBUMIN 3.1* 3.0* 3.2*   No results for input(s): "LIPASE", "AMYLASE" in the last 168 hours. No results for input(s): "AMMONIA" in the last 168 hours. Coagulation profile No results for input(s): "INR", "PROTIME" in the last 168 hours.   CBC: Recent Labs  Lab 09/16/22 0056 09/16/22 2328 09/18/22 0217 09/19/22 0234 09/20/22 0206  WBC 11.5* 11.8* 11.5* 11.1* 12.2*  HGB 11.1* 11.9* 11.7* 11.7* 12.2  HCT 34.2* 35.9* 36.0 35.7* 37.5  MCV 89.3 90.0 89.3 88.4 89.7  PLT 333 324 337 343 354   Cardiac Enzymes: Recent Labs  Lab 09/15/22 0208 09/16/22 0056 09/16/22 2328 09/18/22 0217 09/19/22 0234  CKTOTAL 1,401* 641* 437* 350* 421*   BNP: Invalid input(s): "POCBNP" CBG: Recent Labs  Lab 09/19/22 1144 09/19/22 1613 09/19/22 2008 09/20/22 0610 09/20/22 1152  GLUCAP 135* 145* 140* 87 129*   D-Dimer No results for input(s): "DDIMER" in the last 72 hours. Hgb A1c No results for input(s): "HGBA1C" in the last 72 hours. Lipid Profile No results for input(s): "CHOL", "HDL", "LDLCALC", "TRIG", "CHOLHDL", "LDLDIRECT" in the last 72 hours. Thyroid function studies No results for input(s): "TSH", "T4TOTAL", "T3FREE", "THYROIDAB" in the last 72 hours.  Invalid input(s): "FREET3" Anemia work up No results for input(s):  "VITAMINB12", "FOLATE", "FERRITIN", "TIBC", "IRON", "RETICCTPCT" in the last 72 hours. Microbiology Recent Results (from the past 240 hour(s))  Respiratory (~20 pathogens) panel by PCR     Status: None   Collection Time: 09/13/22  3:02 PM   Specimen: Nasopharyngeal Swab; Respiratory  Result Value Ref Range Status   Adenovirus NOT DETECTED NOT DETECTED Final   Coronavirus 229E NOT DETECTED NOT DETECTED Final    Comment: (NOTE) The Coronavirus on the Respiratory Panel, DOES NOT test for the novel  Coronavirus (2019 nCoV)    Coronavirus HKU1 NOT DETECTED NOT DETECTED Final   Coronavirus NL63 NOT DETECTED NOT DETECTED Final   Coronavirus OC43 NOT DETECTED NOT DETECTED Final   Metapneumovirus NOT DETECTED NOT DETECTED Final   Rhinovirus / Enterovirus NOT DETECTED NOT DETECTED Final   Influenza A NOT DETECTED NOT DETECTED Final   Influenza B NOT DETECTED NOT DETECTED Final   Parainfluenza Virus 1 NOT DETECTED NOT DETECTED Final   Parainfluenza Virus 2 NOT DETECTED NOT DETECTED Final   Parainfluenza Virus 3 NOT DETECTED NOT DETECTED Final   Parainfluenza Virus 4 NOT DETECTED NOT DETECTED Final   Respiratory Syncytial Virus NOT DETECTED NOT DETECTED Final   Bordetella pertussis NOT DETECTED NOT DETECTED Final   Bordetella Parapertussis NOT DETECTED NOT DETECTED Final   Chlamydophila pneumoniae NOT DETECTED NOT DETECTED Final   Mycoplasma pneumoniae NOT DETECTED NOT DETECTED Final    Comment: Performed at [facility], [address], [place], [place] [address]  SARS Coronavirus 2 by RT PCR (hospital order, performed in [facility] hospital lab) *cepheid single result test* Anterior Nasal Swab     Status: None   Collection Time: 09/13/22  3:02 PM   Specimen: Anterior Nasal Swab  Result Value Ref Range Status   SARS Coronavirus 2 by RT PCR NEGATIVE NEGATIVE Final    Comment: Performed at [facility], [address] [place], [place] [address]  Culture, blood (Routine X 2) w  Reflex to ID Panel     Status: None   Collection Time: 09/13/22  9:18 PM   Specimen: BLOOD  RIGHT HAND  Result Value Ref Range Status   Specimen Description BLOOD RIGHT HAND  Final   Special Requests   Final    BOTTLES DRAWN AEROBIC ONLY Blood Culture adequate volume   Culture   Final    NO GROWTH 5 DAYS Performed at [facility], [address], [place], [place] [address]    Report Status 09/18/2022 FINAL  Final  Culture, blood (Routine X 2) w Reflex to ID Panel     Status: None   Collection Time: 09/13/22  9:21 PM   Specimen: BLOOD LEFT HAND  Result Value Ref Range Status   Specimen Description BLOOD LEFT HAND  Final   Special Requests   Final    BOTTLES DRAWN AEROBIC AND ANAEROBIC Blood Culture adequate volume   Culture   Final    NO GROWTH 5 DAYS Performed at [facility], [address] [place], [place] [address]    Report Status 09/18/2022 FINAL  Final     Discharge Instructions:   Discharge Instructions     (HEART FAILURE PATIENTS) Call MD:  Anytime you have any of the following symptoms: 1) 3 pound weight gain in 24 hours or 5 pounds in 1 week 2) shortness of breath, with or without a dry hacking cough 3) swelling in the hands, feet or stomach 4) if you have to sleep on extra pillows at night in order to breathe.   Complete by: As directed    Avoid straining   Complete by: As directed    Diet - low sodium heart healthy   Complete by: As directed    Discharge instructions   Complete by: As directed    Follow-up with your primary care provider in 1 week.  Check blood work at that time.  Follow-up with cardiology and oncology as has been scheduled.  Seek medical attention for worsening symptoms.  Take medications and steroids as prescribed.  No overexertion.   Heart Failure patients record your daily weight using the same scale at the same time of day   Complete by: As directed    Increase activity slowly   Complete by: As directed    STOP any activity  that causes chest pain, shortness of breath, dizziness, sweating, or exessive weakness   Complete by: As directed       Allergies as of 09/20/2022       Reactions   Famotidine    "Made me tired and weak"        Medication List     STOP taking these medications    amLODipine 10 MG tablet Commonly known as: NORVASC   aspirin EC 81 MG tablet   metoprolol tartrate 25 MG tablet Commonly known as: LOPRESSOR       TAKE these medications    albuterol 108 (90 Base) MCG/ACT inhaler Commonly known as: VENTOLIN HFA INHALE 1 TO 2 PUFFS INTO THE LUNGS EVERY 6 HOURS AS NEEDED FOR WHEEZING OR SHORTNESS OF BREATH What changed: See the new instructions.   apixaban 5 MG Tabs tablet Commonly known as: ELIQUIS Take 1 tablet (5mg ) by mouth twice daily What changed:  how much to take how to take this when to take this   atorvastatin 20 MG tablet Commonly known as: LIPITOR Take 1 tablet (20 mg total) by mouth at bedtime.   carvedilol 3.125 MG tablet Commonly known as: COREG Take 1 tablet (3.125 mg total) by mouth 2 (two) times daily with a meal.  cilostazol 100 MG tablet Commonly known as: PLETAL Take 100 mg by mouth 2 (two) times daily.   colchicine 0.6 MG tablet Take 1 tablet (0.6 mg total) by mouth daily for 7 days.   empagliflozin 10 MG Tabs tablet Commonly known as: JARDIANCE Take 1 tablet (10 mg total) by mouth daily.   furosemide 40 MG tablet Commonly known as: LASIX Take 1 tablet (40 mg total) by mouth daily.   Geritol Complete Tabs Take 1 tablet by mouth daily with breakfast.   hydrocortisone 1 % ointment Apply 1 Application topically 2 (two) times daily. What changed:  when to take this additional instructions   hydrOXYzine 10 MG tablet Commonly known as: ATARAX Take 1 tablet (10 mg total) by mouth at bedtime as needed for anxiety (sleep).   losartan 25 MG tablet Commonly known as: COZAAR Take 1 tablet (25 mg total) by mouth daily.   magic  mouthwash (nystatin, lidocaine, diphenhydrAMINE, alum & mag hydroxide) suspension Swish and spit 5 mLs 4 (four) times daily as needed for up to 10 days for mouth pain.   nicotine polacrilex 2 MG gum Commonly known as: NICORETTE Take 1 each (2 mg total) by mouth as needed for smoking cessation.   pantoprazole 40 MG tablet Commonly known as: PROTONIX Take 1 tablet (40 mg total) by mouth daily.   predniSONE 10 MG tablet Commonly known as: DELTASONE Take 6 tablets (60 mg total) by mouth daily with breakfast for 4 days, THEN 5 tablets (50 mg total) daily with breakfast for 7 days, THEN 4 tablets (40 mg total) daily with breakfast for 7 days, THEN 3 tablets (30 mg total) daily with breakfast for 7 days, THEN 2 tablets (20 mg total) daily with breakfast for 7 days, THEN 1 tablet (10 mg total) daily with breakfast for 7 days, THEN 0.5 tablets (5 mg total) daily with breakfast for 7 days. Start taking on: September 20, 2022   Spiriva Respimat 2.5 MCG/ACT Aers Generic drug: Tiotropium Bromide Monohydrate Inhale 2 puffs into the lungs daily.   spironolactone 25 MG tablet Commonly known as: ALDACTONE Take 0.5 tablets (12.5 mg total) by mouth daily.        Follow-up Information     Willow Ora, MD Follow up on 09/24/2022.   Specialty: Family Medicine Why: 2:30 for hospital followup please arrive at 2:15 Contact information: [address] [place] [address] [phone]         Little Ishikawa, MD. Go on 09/29/2022.   Specialties: Cardiology, Radiology Contact information: [address] [place] [address] [phone]         Ladene Artist, MD Follow up in 1 week(s).   Specialty: Oncology Why: call office Contact information: [address] [place] [address] [phone]         Care, [facility] Follow up.   Specialty: Home Health Services Contact information: [address] [place]  [address] [phone]                  Time coordinating discharge: 39 minutes  Signed:     [facility] 09/21/2022, 3:10 PM

## 2022-09-22 ENCOUNTER — Telehealth: Payer: Self-pay

## 2022-09-22 ENCOUNTER — Other Ambulatory Visit: Payer: Self-pay

## 2022-09-22 DIAGNOSIS — C220 Liver cell carcinoma: Secondary | ICD-10-CM

## 2022-09-22 NOTE — Assessment & Plan Note (Deleted)
-  Most likely secondary to her immunotherapy -She was started on high-dose steroids in the hospital on September 14, 2022, currently on prednisone 60mg  daily, will gradually taper off in the next 6 to 7 weeks -Follow-up with cardiology

## 2022-09-22 NOTE — Assessment & Plan Note (Deleted)
-  previously treated  --Will monitor.  Hepatitis C virus RNA was negative  -Recent MRI showed no definitive evidence of liver cirrhosis,

## 2022-09-22 NOTE — Assessment & Plan Note (Deleted)
cT3N0M0, stage IIIA, BCLC advanced stage (C) -Patient was hospitalized for colitis on 07/18/2022, CT scan showed multiple liver masses -Liver MRI on July 19, 2022 showed extensive tumor identified throughout both lobes of the liver, radiographic characteristic are not consistent with hepatocellular carcinoma, her AFP was 3, with her history of hepatitis C, this is diagnostic for multifocal HCC or HCC with intrahepatic metastasis -I do not think she needs liver biopsy. -I will review her case in our GI tumor board next week. -I discussed the incurable nature of her liver cancer, due to the diffuse involvement of both lobes.  She is not a candidate for liver resection or liver transplant. 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  She responded to steroid which started on 09/14/2022, will gradually taper her prednisone off in the next 6-7 weeks.  -I do not plan to continue immunotherapy.  I discussed other treatment options, especially TKI's, such as Lenvima.  Will wait until she recovers well from myocarditis. -I have also referred her to IR to consider liver targeted therapy.

## 2022-09-22 NOTE — Transitions of Care (Post Inpatient/ED Visit) (Signed)
   09/22/2022  Name: Shiron Eastham MRN: [id number] DOB: 1950/02/20  Today's TOC FU Call Status: Today's TOC FU Call Status:: Unsuccessful Call (1st Attempt) Unsuccessful Call (1st Attempt) Date: 09/22/22  Attempted to reach the patient regarding the most recent Inpatient/ED visit.  Follow Up Plan: Additional outreach attempts will be made to reach the patient to complete the Transitions of Care (Post Inpatient/ED visit) call.      Antionette Fairy, RN,BSN,CCM [facility] Care Management Care Management Community Coordinator Direct Phone: [phone] Toll Free: [phone] Fax: [phone]

## 2022-09-23 ENCOUNTER — Inpatient Hospital Stay: Payer: Medicare HMO | Admitting: Hematology

## 2022-09-23 ENCOUNTER — Inpatient Hospital Stay: Payer: Medicare HMO | Attending: Hematology

## 2022-09-23 ENCOUNTER — Telehealth: Payer: Self-pay

## 2022-09-23 DIAGNOSIS — I514 Myocarditis, unspecified: Secondary | ICD-10-CM | POA: Insufficient documentation

## 2022-09-23 DIAGNOSIS — C220 Liver cell carcinoma: Secondary | ICD-10-CM | POA: Insufficient documentation

## 2022-09-23 DIAGNOSIS — Z79899 Other long term (current) drug therapy: Secondary | ICD-10-CM | POA: Insufficient documentation

## 2022-09-23 DIAGNOSIS — R070 Pain in throat: Secondary | ICD-10-CM | POA: Insufficient documentation

## 2022-09-23 DIAGNOSIS — B182 Chronic viral hepatitis C: Secondary | ICD-10-CM | POA: Insufficient documentation

## 2022-09-23 DIAGNOSIS — R490 Dysphonia: Secondary | ICD-10-CM | POA: Insufficient documentation

## 2022-09-23 DIAGNOSIS — I408 Other acute myocarditis: Secondary | ICD-10-CM

## 2022-09-23 NOTE — Transitions of Care (Post Inpatient/ED Visit) (Signed)
09/23/2022  Name: Christine Hurley MRN: [id number] DOB: 1950-12-03  Today's TOC FU Call Status: Today's TOC FU Call Status:: Successful TOC FU Call Completed TOC FU Call Complete Date: 09/23/22  Transition Care Management Follow-up Telephone Call Date of Discharge: 09/20/22 Discharge Facility: [facility]) Type of Discharge: Inpatient Admission Primary Inpatient Discharge Diagnosis:: "chest pain,unspecified" How have you been since you were released from the hospital?: Same (Pt voices she didn't sleep well lalst night-having trouble with coughing phlegm-elevating HOB & using incentive spirometry along w/ inhalers.) Any questions or concerns?: No  Items Reviewed: Did you receive and understand the discharge instructions provided?: Yes Medications obtained,verified, and reconciled?: Yes (Medications Reviewed) (pt was not taking Geritol sinve returning home-will begin taking it as ordered) Any new allergies since your discharge?: No Dietary orders reviewed?: Yes Type of Diet Ordered:: low salt/heart healthy Do you have support at home?: Yes People in Home: significant other Name of Support/Comfort Primary Source: pt voices finace took off work for a wk to assist her-she also has several family members to help her out as well  Medications Reviewed Today: Medications Reviewed Today     Reviewed by Charlyn Minerva, RN (Registered Nurse) on 09/23/22 at 1347  Med List Status: <None>   Medication Order Taking? Sig Documenting Provider Last Dose Status Informant  albuterol (VENTOLIN HFA) 108 (90 Base) MCG/ACT inhaler [id number] Yes INHALE 1 TO 2 PUFFS INTO THE LUNGS EVERY 6 HOURS AS NEEDED FOR WHEEZING OR SHORTNESS OF BREATH  Patient taking differently: Inhale 2 puffs into the lungs every 6 (six) hours as needed for wheezing or shortness of breath.   Willow Ora, MD Taking Active Self, Pharmacy Records  apixaban [facility]) 5 MG TABS tablet [id number] Yes Take 1 tablet (5mg )  by mouth twice daily  Patient taking differently: Take 5 mg by mouth in the morning and at bedtime.   Maretta Bees, MD Taking Active Self, Pharmacy Records  atorvastatin (LIPITOR) 20 MG tablet [id number] Yes Take 1 tablet (20 mg total) by mouth at bedtime. Willeen Niece, MD Taking Active Self, Pharmacy Records  carvedilol (COREG) 3.125 MG tablet [id number] Yes Take 1 tablet (3.125 mg total) by mouth 2 (two) times daily with a meal. Pokhrel, Laxman, MD Taking Active   cilostazol (PLETAL) 100 MG tablet [id number] Yes Take 100 mg by mouth 2 (two) times daily. [provider] Taking Active Self, Pharmacy Records  colchicine 0.6 MG tablet [id number] Yes Take 1 tablet (0.6 mg total) by mouth daily for 7 days. Pokhrel, Laxman, MD Taking Active   empagliflozin (JARDIANCE) 10 MG TABS tablet [id number] Yes Take 1 tablet (10 mg total) by mouth daily. Pokhrel, Laxman, MD Taking Active   furosemide (LASIX) 40 MG tablet [id number] Yes Take 1 tablet (40 mg total) by mouth daily. Pokhrel, Laxman, MD Taking Active   hydrocortisone 1 % ointment [id number] Yes Apply 1 Application topically 2 (two) times daily.  Patient taking differently: Apply 1 Application topically See admin instructions. Apply to affected areas 2 times a day   Shanon Ace, PA-C Taking Active Self, Pharmacy Records  hydrOXYzine (ATARAX) 10 MG tablet [id number] Yes Take 1 tablet (10 mg total) by mouth at bedtime as needed for anxiety (sleep). Joycelyn Das, MD Taking Active   Iron-Vitamins (GERITOL COMPLETE) TABS [id number] Yes Take 1 tablet by mouth daily with breakfast. [provider] Taking Active Self, Pharmacy Records  losartan (COZAAR) 25 MG tablet [id number] Yes Take 1 tablet (25 mg total) by mouth daily.  Pokhrel, Rebekah Chesterfield, MD Taking Active   magic mouthwash (nystatin, lidocaine, diphenhydrAMINE, alum & mag hydroxide) suspension [id number] Yes Swish and spit 5 mLs 4 (four) times daily as needed for up to 10 days for  mouth pain. Pokhrel, Laxman, MD Taking Active   nicotine polacrilex (NICORETTE) 2 MG gum [id number] No Take 1 each (2 mg total) by mouth as needed for smoking cessation.  Patient not taking: Reported on 09/23/2022   Joycelyn Das, MD Not Taking Active   pantoprazole (PROTONIX) 40 MG tablet [id number] Yes Take 1 tablet (40 mg total) by mouth daily. Pokhrel, Laxman, MD Taking Active   predniSONE (DELTASONE) 10 MG tablet [id number] Yes Take 6 tablets (60 mg total) by mouth daily with breakfast for 4 days, THEN 5 tablets (50 mg total) daily with breakfast for 7 days, THEN 4 tablets (40 mg total) daily with breakfast for 7 days, THEN 3 tablets (30 mg total) daily with breakfast for 7 days, THEN 2 tablets (20 mg total) daily with breakfast for 7 days, THEN 1 tablet (10 mg total) daily with breakfast for 7 days, THEN 0.5 tablets (5 mg total) daily with breakfast for 7 days. Pokhrel, Laxman, MD Taking Active   spironolactone (ALDACTONE) 25 MG tablet [id number] Yes Take 0.5 tablets (12.5 mg total) by mouth daily. Pokhrel, Laxman, MD Taking Active   Tiotropium Bromide Monohydrate (SPIRIVA RESPIMAT) 2.5 MCG/ACT AERS [id number] Yes Inhale 2 puffs into the lungs daily. Pokhrel, Rebekah Chesterfield, MD Taking Active             Home Care and Equipment/Supplies: Were Home Health Services Ordered?: Yes Name of Home Health Agency:: [facility] Has Agency set up a time to come to your home?: No (provided pt and caregiver with contact info for [facility] agency and advised to call to arrange home visit) Any new equipment or medical supplies ordered?: No  Functional Questionnaire: Do you need assistance with bathing/showering or dressing?: Yes (fiance helping pt out with ADLs) Do you need assistance with meal preparation?: Yes Do you need assistance with eating?: No Do you have difficulty maintaining continence: No Do you need assistance with getting out of bed/getting out of a chair/moving?: No Do you have difficulty managing or  taking your medications?: Yes  Follow up appointments reviewed: PCP Follow-up appointment confirmed?: Yes Date of PCP follow-up appointment?: 09/24/22 Follow-up Provider: Dr. Mardelle Matte Specialist [facility] Follow-up appointment confirmed?: Yes Date of Specialist follow-up appointment?: 09/29/22 Follow-Up Specialty Provider:: Dr. Therapist, sports, pt was not aware of appt today with oncologist and did not attend-provided her with contact info to Dr. Latanya Maudlin office to call and reschedule appt Do you need transportation to your follow-up appointment?: No Do you understand care options if your condition(s) worsen?: Yes-patient verbalized understanding  SDOH Interventions Today    Flowsheet Row Most Recent Value  SDOH Interventions   Food Insecurity Interventions Intervention Not Indicated  Transportation Interventions Intervention Not Indicated      TOC Interventions Today    Flowsheet Row Most Recent Value  TOC Interventions   TOC Interventions Discussed/Reviewed TOC Interventions Discussed      Interventions Today    Flowsheet Row Most Recent Value  General Interventions   General Interventions Discussed/Reviewed General Interventions Discussed, Doctor Visits  Doctor Visits Discussed/Reviewed Doctor Visits Discussed, PCP, Specialist  PCP/Specialist Visits Compliance with follow-up visit  Education Interventions   Education Provided Provided Education  Provided Verbal Education On Nutrition, When to see the doctor, Medication, Other  [resp sx mgmt, smoking cessation-pt states she has not had  any cigarettes since returning home]  Nutrition Interventions   Nutrition Discussed/Reviewed Nutrition Discussed  Pharmacy Interventions   Pharmacy Dicussed/Reviewed Pharmacy Topics Discussed, Medications and their functions  Safety Interventions   Safety Discussed/Reviewed Safety Discussed       Alessandra Grout [facility] Care Management Care Management Community  Coordinator Direct Phone: [phone] Toll Free: [phone] Fax: [phone]

## 2022-09-24 ENCOUNTER — Telehealth: Payer: Self-pay | Admitting: Hematology

## 2022-09-24 ENCOUNTER — Inpatient Hospital Stay: Payer: Medicare HMO | Admitting: Family Medicine

## 2022-09-25 ENCOUNTER — Emergency Department (HOSPITAL_COMMUNITY)
Admission: EM | Admit: 2022-09-25 | Discharge: 2022-09-26 | Disposition: A | Discharge status: Home / Self Care | Payer: Medicare HMO | Attending: Emergency Medicine | Admitting: Emergency Medicine

## 2022-09-25 ENCOUNTER — Emergency Department (HOSPITAL_COMMUNITY): Payer: Medicare HMO

## 2022-09-25 ENCOUNTER — Encounter (HOSPITAL_COMMUNITY): Payer: Self-pay

## 2022-09-25 ENCOUNTER — Other Ambulatory Visit: Payer: Self-pay

## 2022-09-25 DIAGNOSIS — Z7901 Long term (current) use of anticoagulants: Secondary | ICD-10-CM | POA: Diagnosis not present

## 2022-09-25 DIAGNOSIS — R0600 Dyspnea, unspecified: Secondary | ICD-10-CM | POA: Diagnosis not present

## 2022-09-25 DIAGNOSIS — R5381 Other malaise: Secondary | ICD-10-CM | POA: Diagnosis not present

## 2022-09-25 DIAGNOSIS — Z1152 Encounter for screening for COVID-19: Secondary | ICD-10-CM | POA: Insufficient documentation

## 2022-09-25 DIAGNOSIS — R531 Weakness: Secondary | ICD-10-CM | POA: Diagnosis not present

## 2022-09-25 DIAGNOSIS — I5042 Chronic combined systolic (congestive) and diastolic (congestive) heart failure: Secondary | ICD-10-CM | POA: Diagnosis not present

## 2022-09-25 DIAGNOSIS — R0602 Shortness of breath: Secondary | ICD-10-CM | POA: Insufficient documentation

## 2022-09-25 DIAGNOSIS — J449 Chronic obstructive pulmonary disease, unspecified: Secondary | ICD-10-CM | POA: Insufficient documentation

## 2022-09-25 DIAGNOSIS — R918 Other nonspecific abnormal finding of lung field: Secondary | ICD-10-CM | POA: Diagnosis not present

## 2022-09-25 DIAGNOSIS — Z8679 Personal history of other diseases of the circulatory system: Secondary | ICD-10-CM | POA: Insufficient documentation

## 2022-09-25 DIAGNOSIS — I77819 Aortic ectasia, unspecified site: Secondary | ICD-10-CM | POA: Diagnosis not present

## 2022-09-25 DIAGNOSIS — J9811 Atelectasis: Secondary | ICD-10-CM | POA: Diagnosis not present

## 2022-09-25 DIAGNOSIS — R000 Tachycardia, unspecified: Secondary | ICD-10-CM | POA: Diagnosis not present

## 2022-09-25 LAB — COMPREHENSIVE METABOLIC PANEL
ALT: 266 U/L — ABNORMAL HIGH (ref 0–44)
AST: 91 U/L — ABNORMAL HIGH (ref 15–41)
Albumin: 2.7 g/dL — ABNORMAL LOW (ref 3.5–5.0)
Alkaline Phosphatase: 227 U/L — ABNORMAL HIGH (ref 38–126)
Anion gap: 11 (ref 5–15)
BUN: 24 mg/dL — ABNORMAL HIGH (ref 8–23)
CO2: 22 mmol/L (ref 22–32)
Calcium: 8.3 mg/dL — ABNORMAL LOW (ref 8.9–10.3)
Chloride: 103 mmol/L (ref 98–111)
Creatinine, Ser: 0.95 mg/dL (ref 0.44–1.00)
GFR, Estimated: >60 mL/min (ref >60)
Glucose, Bld: 105 mg/dL — ABNORMAL HIGH (ref 70–99)
Potassium: 3 mmol/L — ABNORMAL LOW (ref 3.5–5.1)
Sodium: 136 mmol/L (ref 135–145)
Total Bilirubin: 0.5 mg/dL (ref 0.3–1.2)
Total Protein: 6.1 g/dL — ABNORMAL LOW (ref 6.5–8.1)

## 2022-09-25 LAB — RESPIRATORY PANEL BY PCR

## 2022-09-25 LAB — CBC
HCT: 36.6 % (ref 36.0–46.0)
Hemoglobin: 11.9 g/dL — ABNORMAL LOW (ref 12.0–15.0)
MCH: 28.8 pg (ref 26.0–34.0)
MCHC: 32.5 g/dL (ref 30.0–36.0)
MCV: 88.6 fL (ref 80.0–100.0)
Platelets: 357 10*3/uL (ref 150–400)
RBC: 4.13 MIL/uL (ref 3.87–5.11)
RDW: 15.9 % — ABNORMAL HIGH (ref 11.5–15.5)
WBC: 10.7 10*3/uL — ABNORMAL HIGH (ref 4.0–10.5)
nRBC: 0 % (ref 0.0–0.2)

## 2022-09-25 LAB — TROPONIN I (HIGH SENSITIVITY)
Troponin I (High Sensitivity): 28 ng/L — ABNORMAL HIGH (ref <18)
Troponin I (High Sensitivity): 28 ng/L — ABNORMAL HIGH (ref <18)

## 2022-09-25 LAB — C-REACTIVE PROTEIN: CRP: 0.5 mg/dL (ref <1.0)

## 2022-09-25 LAB — SARS CORONAVIRUS 2 BY RT PCR: SARS Coronavirus 2 by RT PCR: NEGATIVE

## 2022-09-25 LAB — BRAIN NATRIURETIC PEPTIDE: B Natriuretic Peptide: 112.2 pg/mL — ABNORMAL HIGH (ref 0.0–100.0)

## 2022-09-25 LAB — SEDIMENTATION RATE: Sed Rate: 30 mm/hr — ABNORMAL HIGH (ref 0–22)

## 2022-09-25 MED ORDER — APIXABAN 5 MG PO TABS
5.0000 mg | ORAL_TABLET | Freq: Two times a day (BID) | ORAL | Status: DC
  Administered 2022-09-25: 5 mg via ORAL
  Filled 2022-09-25: qty 1

## 2022-09-25 MED ORDER — ALBUTEROL SULFATE HFA 108 (90 BASE) MCG/ACT IN AERS
2.0000 | INHALATION_SPRAY | RESPIRATORY_TRACT | Status: DC | PRN
  Administered 2022-09-25: 2 via RESPIRATORY_TRACT
  Filled 2022-09-25: qty 6.7

## 2022-09-25 MED ORDER — PREDNISONE 20 MG PO TABS
50.0000 mg | ORAL_TABLET | Freq: Every day | ORAL | Status: DC
  Administered 2022-09-26: 50 mg via ORAL
  Filled 2022-09-25: qty 3

## 2022-09-25 MED ORDER — PREDNISONE 20 MG PO TABS: 40.0000 mg | ORAL_TABLET | Freq: Every day | ORAL | Status: DC

## 2022-09-25 MED ORDER — LOSARTAN POTASSIUM 50 MG PO TABS: 25.0000 mg | ORAL_TABLET | Freq: Every day | ORAL | Status: DC

## 2022-09-25 MED ORDER — ATORVASTATIN CALCIUM 10 MG PO TABS
20.0000 mg | ORAL_TABLET | Freq: Every day | ORAL | Status: DC
  Administered 2022-09-25: 20 mg via ORAL
  Filled 2022-09-25: qty 2

## 2022-09-25 MED ORDER — FUROSEMIDE 20 MG PO TABS: 40.0000 mg | ORAL_TABLET | Freq: Every day | ORAL | Status: DC

## 2022-09-25 MED ORDER — TIOTROPIUM BROMIDE MONOHYDRATE 2.5 MCG/ACT IN AERS: 2.0000 | INHALATION_SPRAY | Freq: Every day | RESPIRATORY_TRACT | Status: DC

## 2022-09-25 MED ORDER — PREDNISONE 5 MG PO TABS: 5.0000 mg | ORAL_TABLET | Freq: Every day | ORAL | Status: DC

## 2022-09-25 MED ORDER — HYDROXYZINE HCL 10 MG PO TABS: 10.0000 mg | ORAL_TABLET | Freq: Every evening | ORAL | Status: DC | PRN

## 2022-09-25 MED ORDER — PANTOPRAZOLE SODIUM 40 MG PO TBEC
40.0000 mg | DELAYED_RELEASE_TABLET | Freq: Every day | ORAL | Status: DC
  Administered 2022-09-25: 40 mg via ORAL
  Filled 2022-09-25: qty 1

## 2022-09-25 MED ORDER — SPIRONOLACTONE 12.5 MG HALF TABLET: 12.5000 mg | ORAL_TABLET | Freq: Every day | ORAL | Status: DC

## 2022-09-25 MED ORDER — IPRATROPIUM-ALBUTEROL 0.5-2.5 (3) MG/3ML IN SOLN
3.0000 mL | Freq: Once | RESPIRATORY_TRACT | Status: AC
  Administered 2022-09-25: 3 mL via RESPIRATORY_TRACT
  Filled 2022-09-25: qty 3

## 2022-09-25 MED ORDER — IPRATROPIUM-ALBUTEROL 0.5-2.5 (3) MG/3ML IN SOLN
3.0000 mL | RESPIRATORY_TRACT | Status: DC | PRN
  Administered 2022-09-25: 3 mL via RESPIRATORY_TRACT
  Filled 2022-09-25: qty 3

## 2022-09-25 MED ORDER — PREDNISONE 20 MG PO TABS: 20.0000 mg | ORAL_TABLET | Freq: Every day | ORAL | Status: DC

## 2022-09-25 MED ORDER — CARVEDILOL 3.125 MG PO TABS
3.1250 mg | ORAL_TABLET | Freq: Two times a day (BID) | ORAL | Status: DC
  Administered 2022-09-26: 3.125 mg via ORAL
  Filled 2022-09-25: qty 1

## 2022-09-25 MED ORDER — AEROCHAMBER PLUS FLO-VU LARGE MISC
1.0000 | Freq: Once | Status: AC
  Administered 2022-09-25: 1

## 2022-09-25 MED ORDER — EMPAGLIFLOZIN 10 MG PO TABS: 10.0000 mg | ORAL_TABLET | Freq: Every day | ORAL | Status: DC

## 2022-09-25 MED ORDER — UMECLIDINIUM BROMIDE 62.5 MCG/ACT IN AEPB
1.0000 | INHALATION_SPRAY | Freq: Every day | RESPIRATORY_TRACT | Status: DC
  Administered 2022-09-26: 1 via RESPIRATORY_TRACT
  Filled 2022-09-25: qty 7

## 2022-09-25 MED ORDER — POTASSIUM CHLORIDE CRYS ER 20 MEQ PO TBCR
40.0000 meq | EXTENDED_RELEASE_TABLET | Freq: Once | ORAL | Status: AC
  Administered 2022-09-25: 40 meq via ORAL
  Filled 2022-09-25: qty 2

## 2022-09-25 MED ORDER — COLCHICINE 0.6 MG PO TABS: 0.6000 mg | ORAL_TABLET | Freq: Every day | ORAL | Status: DC

## 2022-09-25 MED ORDER — PREDNISONE 20 MG PO TABS: 10.0000 mg | ORAL_TABLET | Freq: Every day | ORAL | Status: DC

## 2022-09-25 MED ORDER — OXYCODONE-ACETAMINOPHEN 5-325 MG PO TABS
1.0000 | ORAL_TABLET | Freq: Once | ORAL | Status: AC
  Administered 2022-09-25: 1 via ORAL
  Filled 2022-09-25: qty 1

## 2022-09-25 MED ORDER — PREDNISONE 20 MG PO TABS: 30.0000 mg | ORAL_TABLET | Freq: Every day | ORAL | Status: DC

## 2022-09-25 NOTE — ED Provider Notes (Signed)
Christine Hurley EMERGENCY DEPARTMENT AT [facility] Provider Note   CSN: [id number] Arrival date & time: 09/25/22  1257     History  Chief Complaint  Patient presents with   Shortness of Breath    Pt is coming from home with shob and weakness. Pt states that she awoke this AM with shob. Pt recently Dc'd from [facility] with liver CA treatment and CHF. Pt denies any chest pain.     Christine Hurley is a 72 y.o. female.  72 year old female with a history of chemotherapy induced myocarditis, NSTEMI, hepatocellular carcinoma, and self-reported COPD who presents to the emergency department with shortness of breath.  Patient was hospitalized and discharged on 09/21/2022 for myocarditis.  Was started on a steroid taper and reports that she is continue to take these.  Says that since going home has been very short of breath and is limiting her ability to walk about her house and take care of herself.  No fever, cough, or chest pain.  She feels like she was discharged too soon.  Says she has been compliant with all her medications including her Eliquis which she takes for portal venous thrombosis.       Home Medications Prior to Admission medications   Medication Sig Start Date End Date Taking? Authorizing Provider  albuterol (VENTOLIN HFA) 108 (90 Base) MCG/ACT inhaler INHALE 1 TO 2 PUFFS INTO THE LUNGS EVERY 6 HOURS AS NEEDED FOR WHEEZING OR SHORTNESS OF BREATH Patient taking differently: Inhale 2 puffs into the lungs every 6 (six) hours as needed for wheezing or shortness of breath. 07/10/20   Willow Ora, MD  apixaban (ELIQUIS) 5 MG TABS tablet Take 1 tablet (5mg ) by mouth twice daily Patient taking differently: Take 5 mg by mouth in the morning and at bedtime. 07/21/22   Ghimire, Werner Lean, MD  atorvastatin (LIPITOR) 20 MG tablet Take 1 tablet (20 mg total) by mouth at bedtime. 09/12/22 10/12/22  Willeen Niece, MD  carvedilol (COREG) 3.125 MG tablet Take 1 tablet (3.125 mg total) by mouth 2  (two) times daily with a meal. 09/20/22 09/20/23  Pokhrel, Rebekah Chesterfield, MD  cilostazol (PLETAL) 100 MG tablet Take 100 mg by mouth 2 (two) times daily.    [provider]  colchicine 0.6 MG tablet Take 1 tablet (0.6 mg total) by mouth daily for 7 days. 09/21/22 09/28/22  Pokhrel, Rebekah Chesterfield, MD  empagliflozin (JARDIANCE) 10 MG TABS tablet Take 1 tablet (10 mg total) by mouth daily. 09/21/22 09/21/23  Pokhrel, Rebekah Chesterfield, MD  furosemide (LASIX) 40 MG tablet Take 1 tablet (40 mg total) by mouth daily. 09/21/22 09/21/23  Pokhrel, Rebekah Chesterfield, MD  hydrocortisone 1 % ointment Apply 1 Application topically 2 (two) times daily. Patient taking differently: Apply 1 Application topically See admin instructions. Apply to affected areas 2 times a day 09/04/22   Shanon Ace, PA-C  hydrOXYzine (ATARAX) 10 MG tablet Take 1 tablet (10 mg total) by mouth at bedtime as needed for anxiety (sleep). 09/20/22   Pokhrel, Rebekah Chesterfield, MD  Iron-Vitamins (GERITOL COMPLETE) TABS Take 1 tablet by mouth daily with breakfast.    [provider]  losartan (COZAAR) 25 MG tablet Take 1 tablet (25 mg total) by mouth daily. 09/21/22 09/21/23  Pokhrel, Rebekah Chesterfield, MD  magic mouthwash (nystatin, lidocaine, diphenhydrAMINE, alum & mag hydroxide) suspension Swish and spit 5 mLs 4 (four) times daily as needed for up to 10 days for mouth pain. 09/20/22 09/30/22  Pokhrel, Rebekah Chesterfield, MD  nicotine polacrilex (NICORETTE) 2 MG  gum Take 1 each (2 mg total) by mouth as needed for smoking cessation. Patient not taking: Reported on 09/23/2022 09/20/22   Joycelyn Das, MD  pantoprazole (PROTONIX) 40 MG tablet Take 1 tablet (40 mg total) by mouth daily. 09/21/22 11/20/22  Pokhrel, Rebekah Chesterfield, MD  predniSONE (DELTASONE) 10 MG tablet Take 6 tablets (60 mg total) by mouth daily with breakfast for 4 days, THEN 5 tablets (50 mg total) daily with breakfast for 7 days, THEN 4 tablets (40 mg total) daily with breakfast for 7 days, THEN 3 tablets (30 mg total) daily with breakfast  for 7 days, THEN 2 tablets (20 mg total) daily with breakfast for 7 days, THEN 1 tablet (10 mg total) daily with breakfast for 7 days, THEN 0.5 tablets (5 mg total) daily with breakfast for 7 days. 09/20/22 11/05/22  Pokhrel, Rebekah Chesterfield, MD  spironolactone (ALDACTONE) 25 MG tablet Take 0.5 tablets (12.5 mg total) by mouth daily. 09/21/22 09/21/23  Pokhrel, Rebekah Chesterfield, MD  Tiotropium Bromide Monohydrate (SPIRIVA RESPIMAT) 2.5 MCG/ACT AERS Inhale 2 puffs into the lungs daily. 09/20/22   Pokhrel, Rebekah Chesterfield, MD      Allergies    Famotidine    Review of Systems   Review of Systems  Physical Exam Updated Vital Signs BP 103/73   Pulse 87   Temp (!) 97.4 F (36.3 C) (Oral)   Resp 20   Ht 5\' 6"  (1.676 m)   Wt 60.5 kg   SpO2 99%   BMI 21.53 kg/m  Physical Exam Vitals and nursing note reviewed.  Constitutional:      General: She is not in acute distress.    Appearance: She is well-developed.  HENT:     Head: Normocephalic and atraumatic.     Right Ear: External ear normal.     Left Ear: External ear normal.     Nose: Nose normal.  Eyes:     Extraocular Movements: Extraocular movements intact.     Conjunctiva/sclera: Conjunctivae normal.     Pupils: Pupils are equal, round, and reactive to light.  Cardiovascular:     Rate and Rhythm: Normal rate and regular rhythm.     Heart sounds: No murmur heard. Pulmonary:     Effort: Pulmonary effort is normal. No respiratory distress.     Breath sounds: Rhonchi (Right lower lobe) present.  Musculoskeletal:     Cervical back: Normal range of motion and neck supple.     Right lower leg: No edema.     Left lower leg: No edema.  Skin:    General: Skin is warm and dry.  Neurological:     Mental Status: She is alert and oriented to person, place, and time. Mental status is at baseline.  Psychiatric:        Mood and Affect: Mood normal.     ED Results / Procedures / Treatments   Labs (all labs ordered are listed, but only abnormal results are  displayed) Labs Reviewed  COMPREHENSIVE METABOLIC PANEL - Abnormal; Notable for the following components:      Result Value   Potassium 3.0 (*)    Glucose, Bld 105 (*)    BUN 24 (*)    Calcium 8.3 (*)    Total Protein 6.1 (*)    Albumin 2.7 (*)    AST 91 (*)    ALT 266 (*)    Alkaline Phosphatase 227 (*)    All other components within normal limits  CBC - Abnormal; Notable for the following components:  WBC 10.7 (*)    Hemoglobin 11.9 (*)    RDW 15.9 (*)    All other components within normal limits  BRAIN NATRIURETIC PEPTIDE - Abnormal; Notable for the following components:   B Natriuretic Peptide 112.2 (*)    All other components within normal limits  TROPONIN I (HIGH SENSITIVITY) - Abnormal; Notable for the following components:   Troponin I (High Sensitivity) 28 (*)    All other components within normal limits  TROPONIN I (HIGH SENSITIVITY) - Abnormal; Notable for the following components:   Troponin I (High Sensitivity) 28 (*)    All other components within normal limits  SARS CORONAVIRUS 2 BY RT PCR  RESPIRATORY PANEL BY PCR  C-REACTIVE PROTEIN  SEDIMENTATION RATE    EKG EKG Interpretation Date/Time:  Thursday September 25 2022 15:02:05 EDT Ventricular Rate:  81 PR Interval:  118 QRS Duration:  94 QT Interval:  364 QTC Calculation: 423 R Axis:   -6  Text Interpretation: Sinus rhythm Atrial premature complex Borderline short PR interval Probable left atrial enlargement Low voltage, precordial leads Abnormal R-wave progression, early transition Nonspecific T abnormalities, lateral leads Confirmed by Vonita Moss [id number]) on 09/25/2022 4:15:38 PM  Radiology DG Chest 2 View  Result Date: 09/25/2022 CLINICAL DATA:  Shortness of breath EXAM: CHEST - 2 VIEW COMPARISON:  X-ray 09/15/2022 FINDINGS: Tiny pleural effusions versus pleural thickening. Underinflation with some right basilar atelectasis. No pneumothorax or edema. Normal cardiopericardial silhouette. Calcified  tortuous aorta. Overlapping cardiac leads. Degenerative changes seen along the spine IMPRESSION: Underinflation. Basilar atelectasis. Question tiny effusion versus pleural thickening. Tortuous and ectatic aorta. Electronically Signed   By: Karen Kays M.D.   On: 09/25/2022 15:27    Procedures Procedures     EMERGENCY DEPARTMENT [place] CARDIAC EXAM "Study: Limited Ultrasound of the Heart and Pericardium"  INDICATIONS:Dyspnea Multiple views of the heart and pericardium were obtained in real-time with a multi-frequency probe.  PERFORMED XL:[id number] IMAGES ARCHIVED?: No LIMITATIONS:  None VIEWS USED: Subcostal 4 chamber, Parasternal long axis, and Parasternal short axis INTERPRETATION: Cardiac activity present, Pericardial effusioin absent, and Normal contractility   Medications Ordered in ED Medications  albuterol (VENTOLIN HFA) 108 (90 Base) MCG/ACT inhaler 2 puff (2 puffs Inhalation Given 09/25/22 1930)  AeroChamber Plus Flo-Vu Large MISC 1 each (has no administration in time range)  ipratropium-albuterol (DUONEB) 0.5-2.5 (3) MG/3ML nebulizer solution 3 mL (has no administration in time range)  colchicine tablet 0.6 mg (has no administration in time range)  atorvastatin (LIPITOR) tablet 20 mg (has no administration in time range)  carvedilol (COREG) tablet 3.125 mg (has no administration in time range)  furosemide (LASIX) tablet 40 mg (has no administration in time range)  losartan (COZAAR) tablet 25 mg (has no administration in time range)  spironolactone (ALDACTONE) tablet 12.5 mg (has no administration in time range)  hydrOXYzine (ATARAX) tablet 10 mg (has no administration in time range)  empagliflozin (JARDIANCE) tablet 10 mg (has no administration in time range)  predniSONE (DELTASONE) tablet 50 mg (has no administration in time range)    Followed by  predniSONE (DELTASONE) tablet 40 mg (has no administration in time range)    Followed by  predniSONE (DELTASONE) tablet 30 mg (has no  administration in time range)    Followed by  predniSONE (DELTASONE) tablet 20 mg (has no administration in time range)    Followed by  predniSONE (DELTASONE) tablet 10 mg (has no administration in time range)    Followed by  predniSONE (DELTASONE) tablet 5  mg (has no administration in time range)  pantoprazole (PROTONIX) EC tablet 40 mg (has no administration in time range)  apixaban (ELIQUIS) tablet 5 mg (has no administration in time range)  Tiotropium Bromide Monohydrate AERS 2 puff (has no administration in time range)  ipratropium-albuterol (DUONEB) 0.5-2.5 (3) MG/3ML nebulizer solution 3 mL (3 mLs Nebulization Given 09/25/22 1440)  potassium chloride SA (KLOR-CON M) CR tablet 40 mEq (40 mEq Oral Given 09/25/22 1553)  oxyCODONE-acetaminophen (PERCOCET/ROXICET) 5-325 MG per tablet 1 tablet (1 tablet Oral Given 09/25/22 1930)    ED Course/ Medical Decision Making/ A&P Clinical Course as of 09/25/22 2032  Thu Sep 25, 2022  1638 Dr Maisie Fus from medicine consulted [RP]    Clinical Course User Index [RP] Rondel Baton, MD                                 Medical Decision Making Amount and/or Complexity of Data Reviewed Labs: ordered. Radiology: ordered.  Risk Prescription drug management.   Lissa Oliva is a 73 y.o. female with comorbidities that complicate the patient evaluation including chemotherapy induced myocarditis, NSTEMI, hepatocellular carcinoma, and self-reported COPD who presents to the emergency department with shortness of breath   Initial Ddx:  Myocarditis, heart failure, COPD, pneumonia, URI, PE  MDM/Course:  Patient presents to the emergency department with persistent shortness of breath after being diagnosed with myocarditis and heart failure.  Is currently on a steroid taper.  Is overall well-appearing on exam does have some rhonchi.  Was given a DuoNeb and reports that her symptoms have improved.  Considered PE but with her being anticoagulated already  this is highly unlikely.  Her troponins, BNP, and LFTs were all improving from her hospitalization which I suspect are all due to her myocarditis and volume overload which appears to be improving.  Chest x-ray without pneumonia.  COVID sent but is pending at this time.  Do not see indication for admission at this time but hospitalist was consulted and has provided the recommendations on the patient while she reports in the emergency department to see physical therapy since she lives at home alone and is having some difficulty getting about her house.  This patient presents to the ED for concern of complaints listed in HPI, this involves an extensive number of treatment options, and is a complaint that carries with it a high risk of complications and morbidity. Disposition including potential need for admission considered.   Dispo: Pending remainder of workup  Additional history obtained from son Records reviewed Outpatient Clinic Notes The following labs were independently interpreted: Chemistry and show  elevated LFTs that are improving likely from congestive hepatopathy I independently reviewed the following imaging with scope of interpretation limited to determining acute life threatening conditions related to emergency care: Chest x-ray and agree with the radiologist interpretation with the following exceptions: none I personally reviewed and interpreted cardiac monitoring: normal sinus rhythm  I personally reviewed and interpreted the pt's EKG: see above for interpretation  I have reviewed the patients home medications and made adjustments as needed Consults:  Internal medicine Social Determinants of health:  Elderly         Final Clinical Impression(s) / ED Diagnoses Final diagnoses:  Dyspnea, unspecified type  History of myocarditis  Physical deconditioning    Rx / DC Orders ED Discharge Orders     None         Rondel Baton,  MD 09/25/22 2032

## 2022-09-25 NOTE — Consult Note (Signed)
History and Physical    Christine Hurley ZOX:[id number] DOB: 1950-03-07 DOA: 09/25/2022  PCP: Willow Ora, MD  Patient coming from: home  I have personally briefly reviewed patient's old medical records in [facility]  Chief Complaint: persistent sob at home   HPI: Christine Hurley is a 72 y.o. female with medical history significant of hepatocellular carcinoma-liver mass (s/p 1 treatment with tremelimumab and durvalumab on 7/5/20240 , hx of NSTEMI with cath 7/31 noting non obstructive disease,recent admission  09/13/22-8/11 for Myocarditis with associated CHFref  related to chemo therapy agent (checkpoint inhibitor ).  Patient during that hospitalization was treated with steroids and discharged on tapering dose over 4-6 weeks, she was also discharged on colchicine as well as losartan 25 daily, spironolactone 12.5 daily, Coreg 3.125 mg twice a day,lasix po and Jardiance 10 mg daily. Patient deemed stable for discharge and was also sent home with home PT. Patient now returns 4 days later with complaint of continued DOE/SOB making it difficult to complete her ADLS as well as severe orthopnea making it difficult to sleep.Patient states that she feels her breathing is not severely worse from discharge but notes it has not gotten better and is causing her severe debility at home. Patient notes no n/v/d/abdominal pain/ no presyncope / no chest pain / no cough/ no fever or chills.   ED Course:  IN ED  Vitals:temp 97.4, bp 100/72, hr 86, rr 22 sat 100% on ra  EKG:nsr pac  WJX:BJYNWGNFAO: Underinflation. Basilar atelectasis. Question tiny effusion versus pleural thickening.   Tortuous and ectatic aorta.  Labs Wbc 10.7, hgb 11.9, plt357 Na 136, K 3, cl 103 glu 105 cr 0.95 Ast 266 /akt 91/alkphos down from 227 down from prior CE28,28 down from prior Bnp 112/ was 278  Tx douneb,potassium,oxycodone, albuterol  Review of Systems: As per HPI otherwise 10 point review of systems negative.    Past Medical History:  Diagnosis Date   Abnormal nuclear stress test February '13   Small, partially reversible inferolateral ischemia. Marked hypertensive response to exercise. Low risk.   Arthritis    Chest pain with minimal risk for cardiac etiology February 2013   Evaluated with echocardiogram and Myoview, do   COPD (chronic obstructive pulmonary disease) (HCC)    Depression    Hepatitis C    Hypertension    Osteopenia of spine 05/22/2016   PAD (peripheral artery disease) (HCC) 10/01/2021   ABI: monophasic and decreased ABIs. Refer to vascular   Right tibial fracture      Past Surgical History:  Procedure Laterality Date   DOPPLER ECHOCARDIOGRAPHY  03/26/2011   LVEF>55% normal LV wall thickness, normal LA, mild aortic sclerosis with trace to mild AI, mild to moderate TR, RVSP of , RA pressure about 5 mmHg   LEFT HEART CATH AND CORONARY ANGIOGRAPHY N/A 09/10/2022   Procedure: LEFT HEART CATH AND CORONARY ANGIOGRAPHY;  Surgeon: [place], Peter M, MD;  Location: [facility] INVASIVE CV LAB;  Service: Cardiovascular;  Laterality: N/A;   NM MYOVIEW LTD  03/27/11   post stress ejection fraction is 72%, abnormal myocardial perfusion study, this is a low risk scan   TIBIA FRACTURE SURGERY Right 09/2008   fracture of right lower leg with open reduction and internal fixation     reports that she has been smoking cigarettes. She has a 11.3 pack-year smoking history. She has never used smokeless tobacco. She reports current alcohol use of about 2.0 standard drinks of alcohol per week. She reports that she  does not currently use drugs after having used the following drugs: Marijuana.  Allergies  Allergen Reactions   Famotidine     "Made me tired and weak"    Family History  Problem Relation Age of Onset   Asthma Mother    Cancer Mother        Brain   Cancer - Prostate Father    Cancer Maternal Grandmother        GYN cancer    Prior to Admission medications   Medication Sig Start  Date End Date Taking? Authorizing Provider  albuterol (VENTOLIN HFA) 108 (90 Base) MCG/ACT inhaler INHALE 1 TO 2 PUFFS INTO THE LUNGS EVERY 6 HOURS AS NEEDED FOR WHEEZING OR SHORTNESS OF BREATH Patient taking differently: Inhale 2 puffs into the lungs every 6 (six) hours as needed for wheezing or shortness of breath. 07/10/20   Willow Ora, MD  apixaban (ELIQUIS) 5 MG TABS tablet Take 1 tablet (5mg ) by mouth twice daily Patient taking differently: Take 5 mg by mouth in the morning and at bedtime. 07/21/22   Ghimire, Werner Lean, MD  atorvastatin (LIPITOR) 20 MG tablet Take 1 tablet (20 mg total) by mouth at bedtime. 09/12/22 10/12/22  Willeen Niece, MD  carvedilol (COREG) 3.125 MG tablet Take 1 tablet (3.125 mg total) by mouth 2 (two) times daily with a meal. 09/20/22 09/20/23  Pokhrel, Rebekah Chesterfield, MD  cilostazol (PLETAL) 100 MG tablet Take 100 mg by mouth 2 (two) times daily.    [provider]  colchicine 0.6 MG tablet Take 1 tablet (0.6 mg total) by mouth daily for 7 days. 09/21/22 09/28/22  Pokhrel, Rebekah Chesterfield, MD  empagliflozin (JARDIANCE) 10 MG TABS tablet Take 1 tablet (10 mg total) by mouth daily. 09/21/22 09/21/23  Pokhrel, Rebekah Chesterfield, MD  furosemide (LASIX) 40 MG tablet Take 1 tablet (40 mg total) by mouth daily. 09/21/22 09/21/23  Pokhrel, Rebekah Chesterfield, MD  hydrocortisone 1 % ointment Apply 1 Application topically 2 (two) times daily. Patient taking differently: Apply 1 Application topically See admin instructions. Apply to affected areas 2 times a day 09/04/22   Shanon Ace, PA-C  hydrOXYzine (ATARAX) 10 MG tablet Take 1 tablet (10 mg total) by mouth at bedtime as needed for anxiety (sleep). 09/20/22   Pokhrel, Rebekah Chesterfield, MD  Iron-Vitamins (GERITOL COMPLETE) TABS Take 1 tablet by mouth daily with breakfast.    [provider]  losartan (COZAAR) 25 MG tablet Take 1 tablet (25 mg total) by mouth daily. 09/21/22 09/21/23  Pokhrel, Rebekah Chesterfield, MD  magic mouthwash (nystatin, lidocaine, diphenhydrAMINE,  alum & mag hydroxide) suspension Swish and spit 5 mLs 4 (four) times daily as needed for up to 10 days for mouth pain. 09/20/22 09/30/22  Pokhrel, Rebekah Chesterfield, MD  nicotine polacrilex (NICORETTE) 2 MG gum Take 1 each (2 mg total) by mouth as needed for smoking cessation. Patient not taking: Reported on 09/23/2022 09/20/22   Joycelyn Das, MD  pantoprazole (PROTONIX) 40 MG tablet Take 1 tablet (40 mg total) by mouth daily. 09/21/22 11/20/22  Pokhrel, Rebekah Chesterfield, MD  predniSONE (DELTASONE) 10 MG tablet Take 6 tablets (60 mg total) by mouth daily with breakfast for 4 days, THEN 5 tablets (50 mg total) daily with breakfast for 7 days, THEN 4 tablets (40 mg total) daily with breakfast for 7 days, THEN 3 tablets (30 mg total) daily with breakfast for 7 days, THEN 2 tablets (20 mg total) daily with breakfast for 7 days, THEN 1 tablet (10 mg total) daily with breakfast for 7 days,  THEN 0.5 tablets (5 mg total) daily with breakfast for 7 days. 09/20/22 11/05/22  Pokhrel, Rebekah Chesterfield, MD  spironolactone (ALDACTONE) 25 MG tablet Take 0.5 tablets (12.5 mg total) by mouth daily. 09/21/22 09/21/23  Pokhrel, Rebekah Chesterfield, MD  Tiotropium Bromide Monohydrate (SPIRIVA RESPIMAT) 2.5 MCG/ACT AERS Inhale 2 puffs into the lungs daily. 09/20/22   Joycelyn Das, MD    Physical Exam: Vitals:   09/25/22 1545 09/25/22 1600 09/25/22 1900 09/25/22 1918  BP: 96/68 91/74  103/73  Pulse: 81 82 86 87  Resp: 17 17 (!) 21 20  Temp:      TempSrc:      SpO2: 100% 100% 100% 99%  Weight:      Height:        Constitutional: NAD, calm, comfortable, does have conversational doe Vitals:   09/25/22 1545 09/25/22 1600 09/25/22 1900 09/25/22 1918  BP: 96/68 91/74  103/73  Pulse: 81 82 86 87  Resp: 17 17 (!) 21 20  Temp:      TempSrc:      SpO2: 100% 100% 100% 99%  Weight:      Height:       Eyes: PERRL, lids and conjunctivae normal ENMT: Mucous membranes are moist. Posterior pharynx clear of any exudate or lesions.Normal dentition.  Neck: normal,  supple, no masses, no thyromegaly Respiratory: clear to auscultation bilaterally, no wheezing, no crackles. Normal respiratory effort. No accessory muscle use.  Cardiovascular: Regular rate and rhythm, no murmurs / rubs / gallops. No extremity edema. 2+ pedal pulses.  Abdomen: no tenderness, no masses palpated. No hepatosplenomegaly. Bowel sounds positive.  Musculoskeletal: no clubbing / cyanosis. No joint deformity upper and lower extremities. Good ROM, no contractures. Normal muscle tone.  Skin: no rashes, lesions, ulcers. No induration Neurologic: CN 2-12 grossly intact. Sensation intact,Strength 5/5 in all 4.  Psychiatric: Normal judgment and insight. Alert and oriented x 3. Normal mood.    Labs on Admission: I have personally reviewed following labs and imaging studies  CBC: Recent Labs  Lab 09/19/22 0234 09/20/22 0206 09/25/22 1410  WBC 11.1* 12.2* 10.7*  HGB 11.7* 12.2 11.9*  HCT 35.7* 37.5 36.6  MCV 88.4 89.7 88.6  PLT 343 354 357   Basic Metabolic Panel: Recent Labs  Lab 09/19/22 0234 09/20/22 0206 09/25/22 1410  NA 134* 133* 136  K 4.0 3.9 3.0*  CL 104 103 103  CO2 19* 20* 22  GLUCOSE 96 101* 105*  BUN 38* 39* 24*  CREATININE 0.92 0.92 0.95  CALCIUM 8.7* 8.7* 8.3*  MG 2.3 2.5*  --    GFR: Estimated Creatinine Clearance: 50.1 mL/min (by C-G formula based on SCr of 0.95 mg/dL). Liver Function Tests: Recent Labs  Lab 09/25/22 1410  AST 91*  ALT 266*  ALKPHOS 227*  BILITOT 0.5  PROT 6.1*  ALBUMIN 2.7*   No results for input(s): "LIPASE", "AMYLASE" in the last 168 hours. No results for input(s): "AMMONIA" in the last 168 hours. Coagulation Profile: No results for input(s): "INR", "PROTIME" in the last 168 hours. Cardiac Enzymes: Recent Labs  Lab 09/19/22 0234  CKTOTAL 421*   BNP (last 3 results) No results for input(s): "PROBNP" in the last 8760 hours. HbA1C: No results for input(s): "HGBA1C" in the last 72 hours. CBG: Recent Labs  Lab  09/19/22 1144 09/19/22 1613 09/19/22 2008 09/20/22 0610 09/20/22 1152  GLUCAP 135* 145* 140* 87 129*   Lipid Profile: No results for input(s): "CHOL", "HDL", "LDLCALC", "TRIG", "CHOLHDL", "LDLDIRECT" in the last 72 hours.  Thyroid Function Tests: No results for input(s): "TSH", "T4TOTAL", "FREET4", "T3FREE", "THYROIDAB" in the last 72 hours. Anemia Panel: No results for input(s): "VITAMINB12", "FOLATE", "FERRITIN", "TIBC", "IRON", "RETICCTPCT" in the last 72 hours. Urine analysis:    Component Value Date/Time   COLORURINE STRAW (A) 07/18/2022 2042   APPEARANCEUR CLEAR 07/18/2022 2042   LABSPEC 1.012 07/18/2022 2042   PHURINE 7.0 07/18/2022 2042   GLUCOSEU NEGATIVE 07/18/2022 2042   HGBUR NEGATIVE 07/18/2022 2042   HGBUR negative 11/06/2009 0000   BILIRUBINUR NEGATIVE 07/18/2022 2042   KETONESUR 20 (A) 07/18/2022 2042   PROTEINUR 30 (A) 07/18/2022 2042   UROBILINOGEN 2.0 11/06/2009 0000   NITRITE NEGATIVE 07/18/2022 2042   LEUKOCYTESUR NEGATIVE 07/18/2022 2042    Radiological Exams on Admission: DG Chest 2 View  Result Date: 09/25/2022 CLINICAL DATA:  Shortness of breath EXAM: CHEST - 2 VIEW COMPARISON:  X-ray 09/15/2022 FINDINGS: Tiny pleural effusions versus pleural thickening. Underinflation with some right basilar atelectasis. No pneumothorax or edema. Normal cardiopericardial silhouette. Calcified tortuous aorta. Overlapping cardiac leads. Degenerative changes seen along the spine IMPRESSION: Underinflation. Basilar atelectasis. Question tiny effusion versus pleural thickening. Tortuous and ectatic aorta. Electronically Signed   By: Karen Kays M.D.   On: 09/25/2022 15:27    EKG: Independently reviewed.   Assessment/Plan  CHFref in setting of Chemo therapy induced myocarditis -currently does not appear to be in acute exacerbation of heart failure  - but is noted to have symptoms of suggestive of CLASS IV heart failure  - continue lasix 40 mg daily -Continue losartan  25 mg daily -Continue spironolactone 12.5 mg daily -Continue carvedilol 3.125 mg twice daily -Continue Jardiance 10 mg daily -agree with referral to Rehab  -close follow up with cardiology     Lurline Del MD [facility]   If 7PM-7AM, please contact night-coverage [email or web address] Password [facility]  09/25/2022, 7:41 PM

## 2022-09-25 NOTE — ED Notes (Signed)
Pt reporting 8/10 chest tightness with SOB, MD made aware. Order received.

## 2022-09-25 NOTE — Progress Notes (Addendum)
  Addend @ 9:02 pm    AT this time the patient will board in the ED per provider patient can benefit from having another PT evaluation. TOC will follow up after evaluation.   CSW spoke to the patient's son. At this time the Son is wanting the mom to go home. The patient is active with HH. At this time [facility] will continue to follow to help with any DC needs.

## 2022-09-26 ENCOUNTER — Emergency Department (HOSPITAL_COMMUNITY): Payer: Medicare HMO

## 2022-09-26 ENCOUNTER — Other Ambulatory Visit (HOSPITAL_COMMUNITY): Payer: Medicare HMO

## 2022-09-26 ENCOUNTER — Telehealth: Payer: Self-pay | Admitting: Cardiology

## 2022-09-26 DIAGNOSIS — R0602 Shortness of breath: Secondary | ICD-10-CM | POA: Diagnosis not present

## 2022-09-26 NOTE — Telephone Encounter (Signed)
Patient's daughter in law, Earnestine Leys called wanting to know if patient should still come to her appt on Monday, as she didn't have her Echo done.  She states patient was admitted back into the hospital for SOB, and she was just discharged today.

## 2022-09-26 NOTE — Evaluation (Signed)
Physical Therapy Evaluation Patient Details Name: Christine Hurley MRN: [id number] DOB: 1951-02-10 Today's Date: 09/26/2022  History of Present Illness  Pt is a 72 y/o F admitted on 09/25/22 after presenting with c/o continued DOE/SOB & orthopnea. Pt is being treated for CHFref in setting of chemo therapy induced myocarditis. PMH: hepatocellular carcinoma-liver mass, NSTEMI with cath 7/31, recent admission 8/3-8/11 for myocarditis, COPD, Hep C, depression, PAD  Clinical Impression  Pt seen for PT evaluation with pt's son & significant other present for session. Pt agreeable to mobility & is able to ambulate into hallway without AD with CGA<>min assist x 30 ft x 2. Intermittent SpO2 reading pre & post gait 99-100% on room air but pt endorsing slight SOB - MD & nurse made aware. PT educates pt & family on recommendation of using RW for increased stability/balance and activity tolerance upon return home & pt & family voice understanding.         If plan is discharge home, recommend the following: A little help with walking and/or transfers;A little help with bathing/dressing/bathroom;Help with stairs or ramp for entrance;Assistance with cooking/housework;Assist for transportation   Can travel by private vehicle        Equipment Recommendations None recommended by PT (pt already has RW)  Recommendations for Other Services       Functional Status Assessment Patient has had a recent decline in their functional status and demonstrates the ability to make significant improvements in function in a reasonable and predictable amount of time.     Precautions / Restrictions Precautions Precautions: Fall Restrictions Weight Bearing Restrictions: No      Mobility  Bed Mobility Overal bed mobility: Needs Assistance Bed Mobility: Supine to Sit     Supine to sit: Supervision, HOB elevated          Transfers Overall transfer level: Needs assistance Equipment used: None Transfers: Sit  to/from Stand Sit to Stand: Contact guard assist                Ambulation/Gait Ambulation/Gait assistance: Contact guard assist, Min assist Gait Distance (Feet): 30 Feet (x 2) Assistive device: None Gait Pattern/deviations: Decreased step length - left, Decreased dorsiflexion - right, Decreased step length - right, Decreased stride length, Decreased dorsiflexion - left Gait velocity: decreased     General Gait Details: Decreased foot clearance BLE, decreased heel strike, lateral sway.  Stairs            Wheelchair Mobility     Tilt Bed    Modified Rankin (Stroke Patients Only)       Balance Overall balance assessment: Needs assistance Sitting-balance support: No upper extremity supported, Feet supported Sitting balance-Leahy Scale: Good     Standing balance support: During functional activity, No upper extremity supported Standing balance-Leahy Scale: Fair                               Pertinent Vitals/Pain Pain Assessment Pain Assessment: No/denies pain    Home Living Family/patient expects to be discharged to:: Private residence Living Arrangements: Spouse/significant other Available Help at Discharge: Family;Available PRN/intermittently (significant other works until noon) Type of Home: House Home Access: Stairs to enter Entrance Stairs-Rails: Right;Left;Can reach both Secretary/administrator of Steps: 3   Home Layout: One level Home Equipment: Cane - single Librarian, academic (2 wheels)      Prior Function Prior Level of Function : Independent/Modified Independent  Mobility Comments: Pt report she's been ambulatory without AD, but minimal walking when significant other is at work & she's home alone.       Extremity/Trunk Assessment   Upper Extremity Assessment Upper Extremity Assessment: Overall WFL for tasks assessed    Lower Extremity Assessment Lower Extremity Assessment: Generalized weakness     Cervical / Trunk Assessment Cervical / Trunk Assessment: Normal  Communication   Communication Communication: No apparent difficulties  Cognition Arousal: Alert Behavior During Therapy: WFL for tasks assessed/performed Overall Cognitive Status: Within Functional Limits for tasks assessed                                 General Comments: Eager to d/c home ASAP        General Comments General comments (skin integrity, edema, etc.): SpO2 99-100% on room air checked prior to & after walking (unable to obtain continuous ambulatory O2 2/2 equipment malfunction)    Exercises     Assessment/Plan    PT Assessment Patient needs continued PT services  PT Problem List Decreased strength;Cardiopulmonary status limiting activity;Decreased activity tolerance;Decreased balance;Decreased mobility;Decreased safety awareness;Decreased knowledge of use of DME       PT Treatment Interventions Gait training;Stair training;Functional mobility training;Therapeutic activities;Balance training;Patient/family education;DME instruction;Therapeutic exercise;Neuromuscular re-education    PT Goals (Current goals can be found in the Care Plan section)  Acute Rehab PT Goals Patient Stated Goal: go home ASAP PT Goal Formulation: With patient/family Time For Goal Achievement: 10/10/22 Potential to Achieve Goals: Good    Frequency Min 1X/week     Co-evaluation               AM-PAC PT "6 Clicks" Mobility  Outcome Measure Help needed turning from your back to your side while in a flat bed without using bedrails?: None Help needed moving from lying on your back to sitting on the side of a flat bed without using bedrails?: A Little Help needed moving to and from a bed to a chair (including a wheelchair)?: A Little Help needed standing up from a chair using your arms (e.g., wheelchair or bedside chair)?: A Little Help needed to walk in hospital room?: A Little Help needed climbing 3-5  steps with a railing? : A Little 6 Click Score: 19    End of Session   Activity Tolerance: Patient tolerated treatment well Patient left: in bed;with family/visitor present Nurse Communication:  (O2) PT Visit Diagnosis: Unsteadiness on feet (R26.81);Muscle weakness (generalized) (M62.81)    Time: 9604-5409 PT Time Calculation (min) (ACUTE ONLY): 15 min   Charges:   PT Evaluation $PT Eval Low Complexity: 1 Low   PT General Charges $$ ACUTE PT VISIT: 1 Visit         Aleda Grana, PT, DPT 09/26/22, 9:52 AM   Sandi Mariscal 09/26/2022, 9:51 AM

## 2022-09-26 NOTE — Telephone Encounter (Signed)
Returned call to pt's daughter Earnestine Leys. The phone just rang and rang and rang. Unable to LVM

## 2022-09-26 NOTE — Discharge Instructions (Signed)
Continue taking all medications as prescribed.  Please follow-up with your primary care doctor within 2 to 3 days.  Come back to the emergency department if you develop any increased difficulty with your breathing.

## 2022-09-26 NOTE — ED Provider Notes (Signed)
[place] EMERGENCY DE[facility]MENT AT [facility] Provider Note   CSN: [id number] Arrival date & time: 09/25/22  1257     History  Chief Complaint  Patient presents with   Shortness of Breath    Pt is coming from home with shob and weakness. Pt states that she awoke this AM with shob. Pt recently Dc'd from [facility] with liver CA treatment and CHF. Pt denies any chest pain.     Christine Hurley is a 72 y.o. female.  Wardell Honour, assumed care for this patient.  Patient was seen by the previous physician, and placed in our yellow pod for evaluation by physical therapy and case management.  I did not receive a formal signout on this patient, chart review and patient interview used for patient history.  Patient has been dealing with myocarditis secondary to cancer treatment recently.  She came to the emergency department for shortness of breath.  Prior physician debated admission versus discharge home.  Patient was evaluated by inpatient medicine team who thought patient would do fine with discharge.  I was contacted by nursing staff this morning because the patient the patient's family no longer wished to stay in the emergency department.     Shortness of Breath      Home Medications Prior to Admission medications   Medication Sig Start Date End Date Taking? Authorizing Provider  albuterol (VENTOLIN HFA) 108 (90 Base) MCG/ACT inhaler INHALE 1 TO 2 PUFFS INTO THE LUNGS EVERY 6 HOURS AS NEEDED FOR WHEEZING OR SHORTNESS OF BREATH Patient taking differently: Inhale 2 puffs into the lungs every 6 (six) hours as needed for wheezing or shortness of breath. 07/10/20  Yes Willow Ora, MD  apixaban (ELIQUIS) 5 MG TABS tablet Take 1 tablet (5mg ) by mouth twice daily Patient taking differently: Take 5 mg by mouth in the morning and at bedtime. 07/21/22  Yes Ghimire, Werner Lean, MD  atorvastatin (LIPITOR) 20 MG tablet Take 1 tablet (20 mg total) by mouth at bedtime. 09/12/22 10/12/22 Yes  Willeen Niece, MD  carvedilol (COREG) 3.125 MG tablet Take 1 tablet (3.125 mg total) by mouth 2 (two) times daily with a meal. 09/20/22 09/20/23 Yes Pokhrel, Laxman, MD  cilostazol (PLETAL) 100 MG tablet Take 100 mg by mouth 2 (two) times daily.   Yes [provider]  colchicine 0.6 MG tablet Take 1 tablet (0.6 mg total) by mouth daily for 7 days. 09/21/22 09/28/22 Yes Pokhrel, Laxman, MD  empagliflozin (JARDIANCE) 10 MG TABS tablet Take 1 tablet (10 mg total) by mouth daily. 09/21/22 09/21/23 Yes Pokhrel, Laxman, MD  furosemide (LASIX) 40 MG tablet Take 1 tablet (40 mg total) by mouth daily. 09/21/22 09/21/23 Yes Pokhrel, Laxman, MD  hydrocortisone 1 % ointment Apply 1 Application topically 2 (two) times daily. Patient taking differently: Apply 1 Application topically See admin instructions. Apply to affected areas 2 times a day 09/04/22  Yes Walisiewicz, Kaitlyn E, PA-C  hydrOXYzine (ATARAX) 10 MG tablet Take 1 tablet (10 mg total) by mouth at bedtime as needed for anxiety (sleep). 09/20/22  Yes Pokhrel, Laxman, MD  Iron-Vitamins (GERITOL COMPLETE) TABS Take 1 tablet by mouth daily with breakfast.   Yes [provider]  losartan (COZAAR) 25 MG tablet Take 1 tablet (25 mg total) by mouth daily. 09/21/22 09/21/23 Yes Pokhrel, Rebekah Chesterfield, MD  magic mouthwash (nystatin, lidocaine, diphenhydrAMINE, alum & mag hydroxide) suspension Swish and spit 5 mLs 4 (four) times daily as needed for up to 10 days for mouth  pain. 09/20/22 09/30/22 Yes Pokhrel, Laxman, MD  pantoprazole (PROTONIX) 40 MG tablet Take 1 tablet (40 mg total) by mouth daily. 09/21/22 11/20/22 Yes Pokhrel, Rebekah Chesterfield, MD  predniSONE (DELTASONE) 10 MG tablet Take 6 tablets (60 mg total) by mouth daily with breakfast for 4 days, THEN 5 tablets (50 mg total) daily with breakfast for 7 days, THEN 4 tablets (40 mg total) daily with breakfast for 7 days, THEN 3 tablets (30 mg total) daily with breakfast for 7 days, THEN 2 tablets (20 mg total) daily with  breakfast for 7 days, THEN 1 tablet (10 mg total) daily with breakfast for 7 days, THEN 0.5 tablets (5 mg total) daily with breakfast for 7 days. 09/20/22 11/05/22 Yes Pokhrel, Laxman, MD  spironolactone (ALDACTONE) 25 MG tablet Take 0.5 tablets (12.5 mg total) by mouth daily. 09/21/22 09/21/23 Yes Pokhrel, Laxman, MD  Tiotropium Bromide Monohydrate (SPIRIVA RESPIMAT) 2.5 MCG/ACT AERS Inhale 2 puffs into the lungs daily. 09/20/22  Yes Pokhrel, Laxman, MD  nicotine polacrilex (NICORETTE) 2 MG gum Take 1 each (2 mg total) by mouth as needed for smoking cessation. Patient not taking: Reported on 09/23/2022 09/20/22   Joycelyn Das, MD      Allergies    Famotidine    Review of Systems   Review of Systems  Respiratory:  Positive for shortness of breath.     Physical Exam Updated Vital Signs BP (!) 138/94 (BP Location: Right Arm)   Pulse (!) 102   Temp 97.6 F (36.4 C) (Oral)   Resp 16   Ht 5\' 6"  (1.676 m)   Wt 60.5 kg   SpO2 100%   BMI 21.53 kg/m  Physical Exam Vitals reviewed.  Pulmonary:     Effort: Pulmonary effort is normal.     Breath sounds: Normal breath sounds. No decreased breath sounds.  Neurological:     Mental Status: She is alert.     ED Results / Procedures / Treatments   Labs (all labs ordered are listed, but only abnormal results are displayed) Labs Reviewed  COMPREHENSIVE METABOLIC PANEL - Abnormal; Notable for the following components:      Result Value   Potassium 3.0 (*)    Glucose, Bld 105 (*)    BUN 24 (*)    Calcium 8.3 (*)    Total Protein 6.1 (*)    Albumin 2.7 (*)    AST 91 (*)    ALT 266 (*)    Alkaline Phosphatase 227 (*)    All other components within normal limits  CBC - Abnormal; Notable for the following components:   WBC 10.7 (*)    Hemoglobin 11.9 (*)    RDW 15.9 (*)    All other components within normal limits  BRAIN NATRIURETIC PEPTIDE - Abnormal; Notable for the following components:   B Natriuretic Peptide 112.2 (*)    All other  components within normal limits  SEDIMENTATION RATE - Abnormal; Notable for the following components:   Sed Rate 30 (*)    All other components within normal limits  TROPONIN I (HIGH SENSITIVITY) - Abnormal; Notable for the following components:   Troponin I (High Sensitivity) 28 (*)    All other components within normal limits  TROPONIN I (HIGH SENSITIVITY) - Abnormal; Notable for the following components:   Troponin I (High Sensitivity) 28 (*)    All other components within normal limits  SARS CORONAVIRUS 2 BY RT PCR  RESPIRATORY PANEL BY PCR  C-REACTIVE PROTEIN    EKG EKG  Interpretation Date/Time:  Thursday September 25 2022 15:02:05 EDT Ventricular Rate:  81 PR Interval:  118 QRS Duration:  94 QT Interval:  364 QTC Calculation: 423 R Axis:   -6  Text Interpretation: Sinus rhythm Atrial premature complex Borderline short PR interval Probable left atrial enlargement Low voltage, precordial leads Abnormal R-wave progression, early transition Nonspecific T abnormalities, lateral leads Confirmed by Vonita Moss [id number]) on 09/25/2022 4:15:38 PM  Radiology DG Chest 2 View  Result Date: 09/25/2022 CLINICAL DATA:  Shortness of breath EXAM: CHEST - 2 VIEW COMPARISON:  X-ray 09/15/2022 FINDINGS: Tiny pleural effusions versus pleural thickening. Underinflation with some right basilar atelectasis. No pneumothorax or edema. Normal cardiopericardial silhouette. Calcified tortuous aorta. Overlapping cardiac leads. Degenerative changes seen along the spine IMPRESSION: Underinflation. Basilar atelectasis. Question tiny effusion versus pleural thickening. Tortuous and ectatic aorta. Electronically Signed   By: Karen Kays M.D.   On: 09/25/2022 15:27    Procedures Procedures    Medications Ordered in ED Medications  albuterol (VENTOLIN HFA) 108 (90 Base) MCG/ACT inhaler 2 puff (2 puffs Inhalation Given 09/25/22 1930)  ipratropium-albuterol (DUONEB) 0.5-2.5 (3) MG/3ML nebulizer solution 3 mL (3  mLs Nebulization Given 09/25/22 2323)  colchicine tablet 0.6 mg (has no administration in time range)  atorvastatin (LIPITOR) tablet 20 mg (20 mg Oral Given 09/25/22 2204)  carvedilol (COREG) tablet 3.125 mg (3.125 mg Oral Given 09/26/22 0847)  furosemide (LASIX) tablet 40 mg (has no administration in time range)  losartan (COZAAR) tablet 25 mg (has no administration in time range)  spironolactone (ALDACTONE) tablet 12.5 mg (has no administration in time range)  hydrOXYzine (ATARAX) tablet 10 mg (has no administration in time range)  empagliflozin (JARDIANCE) tablet 10 mg (has no administration in time range)  predniSONE (DELTASONE) tablet 50 mg (50 mg Oral Given 09/26/22 0846)    Followed by  predniSONE (DELTASONE) tablet 40 mg (has no administration in time range)    Followed by  predniSONE (DELTASONE) tablet 30 mg (has no administration in time range)    Followed by  predniSONE (DELTASONE) tablet 20 mg (has no administration in time range)    Followed by  predniSONE (DELTASONE) tablet 10 mg (has no administration in time range)    Followed by  predniSONE (DELTASONE) tablet 5 mg (has no administration in time range)  pantoprazole (PROTONIX) EC tablet 40 mg (40 mg Oral Given 09/25/22 2203)  apixaban (ELIQUIS) tablet 5 mg (5 mg Oral Given 09/25/22 2204)  umeclidinium bromide (INCRUSE ELLIPTA) 62.5 MCG/ACT 1 puff (has no administration in time range)  ipratropium-albuterol (DUONEB) 0.5-2.5 (3) MG/3ML nebulizer solution 3 mL (3 mLs Nebulization Given 09/25/22 1440)  potassium chloride SA (KLOR-CON M) CR tablet 40 mEq (40 mEq Oral Given 09/25/22 1553)  AeroChamber Plus Flo-Vu Large MISC 1 each (1 each Other Given 09/25/22 2204)  oxyCODONE-acetaminophen (PERCOCET/ROXICET) 5-325 MG per tablet 1 tablet (1 tablet Oral Given 09/25/22 1930)    ED Course/ Medical Decision Making/ A&P Clinical Course as of 09/26/22 0857  Thu Sep 25, 2022  1638 Dr Maisie Fus from medicine consulted [RP]    Clinical Course  User Index [RP] Rondel Baton, MD                                 Medical Decision Making Patient son tells me that the patient has home health at home.  Patient says that her breathing feels completely better.  She is able to ambulate  without any difficulty to and from the bathroom.  She has assistance at home, all medications filled.  Will discharge patient.  Amount and/or Complexity of Data Reviewed Labs: ordered. Radiology: ordered.  Risk Prescription drug management.           Final Clinical Impression(s) / ED Diagnoses Final diagnoses:  Dyspnea, unspecified type  History of myocarditis  Physical deconditioning  SOB (shortness of breath)    Rx / DC Orders ED Discharge Orders     None         Anders Simmonds T, DO 09/26/22 [phone]

## 2022-09-28 DIAGNOSIS — I2510 Atherosclerotic heart disease of native coronary artery without angina pectoris: Secondary | ICD-10-CM | POA: Diagnosis not present

## 2022-09-28 DIAGNOSIS — I319 Disease of pericardium, unspecified: Secondary | ICD-10-CM | POA: Diagnosis not present

## 2022-09-28 DIAGNOSIS — I110 Hypertensive heart disease with heart failure: Secondary | ICD-10-CM | POA: Diagnosis not present

## 2022-09-28 DIAGNOSIS — I5041 Acute combined systolic (congestive) and diastolic (congestive) heart failure: Secondary | ICD-10-CM | POA: Diagnosis not present

## 2022-09-28 DIAGNOSIS — I214 Non-ST elevation (NSTEMI) myocardial infarction: Secondary | ICD-10-CM | POA: Diagnosis not present

## 2022-09-28 DIAGNOSIS — C220 Liver cell carcinoma: Secondary | ICD-10-CM | POA: Diagnosis not present

## 2022-09-28 DIAGNOSIS — J449 Chronic obstructive pulmonary disease, unspecified: Secondary | ICD-10-CM | POA: Diagnosis not present

## 2022-09-28 DIAGNOSIS — I739 Peripheral vascular disease, unspecified: Secondary | ICD-10-CM | POA: Diagnosis not present

## 2022-09-28 DIAGNOSIS — Z48812 Encounter for surgical aftercare following surgery on the circulatory system: Secondary | ICD-10-CM | POA: Diagnosis not present

## 2022-09-28 NOTE — Progress Notes (Unsigned)
Cardiology Office Note:    Date:  09/29/2022   ID:  Rhythm Ricotta, DOB 01-31-1951, MRN [id number]  PCP:  Willow Ora, MD  Cardiologist:  Chilton Si, MD  Electrophysiologist:  None   Referring MD: Willow Ora, MD   Chief Complaint  Patient presents with   Congestive Heart Failure    History of Present Illness:    Christine Hurley is a 72 y.o. female with a hx of stage IIIa hepatocellular carcinoma, PAD, COPD, HCV, immune checkpoint inhibitor myocarditis who presents for follow-up.  She was treated with tremelimumab and durvalumab for her hepatocellular carcinoma on 08/15/2022.  She was admitted 09/08/2022 with NSTEMI with peak troponin 43.  Echocardiogram 7/30 showed EF 60 to 65%, normal RV function.  Cath 7/31 showed mild nonobstructive CAD.  She was discharged but presented back to ED on 8/3 with chest pain and troponin elevated to 1521.  Echo 8/3 showed EF 50 to 55%.  Cardiac MRI 8/5 showed findings consistent with acute myocarditis, LVEF 35%, RVEF 30%.  She was started on steroids for treatment of suspected checkpoint inhibitor myocarditis.  Since discharge from hospital, she was seen in the ED on 09/26/2022 for shortness of breath.  Labs notable for creatinine 0.95, potassium 3.0.  She was discharged from the ED.  Reports has been doing okay.  Denies any further chest pain.  States that dyspnea has improved.  Does report she was up coughing last night.  Also had some lightheadedness recently.  Has noted some lower extremity edema.  Denies any bleeding on Eliquis.   Wt Readings from Last 3 Encounters:  09/29/22 130 lb 3.2 oz (59.1 kg)  09/25/22 133 lb 6.1 oz (60.5 kg)  09/20/22 133 lb 6.1 oz (60.5 kg)     Past Medical History:  Diagnosis Date   Abnormal nuclear stress test February '13   Small, partially reversible inferolateral ischemia. Marked hypertensive response to exercise. Low risk.   Arthritis    Chest pain with minimal risk for cardiac etiology February 2013    Evaluated with echocardiogram and Myoview, do   COPD (chronic obstructive pulmonary disease) (HCC)    Depression    Hepatitis C    Hypertension    Osteopenia of spine 05/22/2016   PAD (peripheral artery disease) (HCC) 10/01/2021   ABI: monophasic and decreased ABIs. Refer to vascular   Right tibial fracture      Past Surgical History:  Procedure Laterality Date   DOPPLER ECHOCARDIOGRAPHY  03/26/2011   LVEF>55% normal LV wall thickness, normal LA, mild aortic sclerosis with trace to mild AI, mild to moderate TR, RVSP of , RA pressure about 5 mmHg   LEFT HEART CATH AND CORONARY ANGIOGRAPHY N/A 09/10/2022   Procedure: LEFT HEART CATH AND CORONARY ANGIOGRAPHY;  Surgeon: [place], Peter M, MD;  Location: [facility] INVASIVE CV LAB;  Service: Cardiovascular;  Laterality: N/A;   NM MYOVIEW LTD  03/27/11   post stress ejection fraction is 72%, abnormal myocardial perfusion study, this is a low risk scan   TIBIA FRACTURE SURGERY Right 09/2008   fracture of right lower leg with open reduction and internal fixation    Current Medications: Current Meds  Medication Sig   albuterol (VENTOLIN HFA) 108 (90 Base) MCG/ACT inhaler INHALE 1 TO 2 PUFFS INTO THE LUNGS EVERY 6 HOURS AS NEEDED FOR WHEEZING OR SHORTNESS OF BREATH (Patient taking differently: Inhale 2 puffs into the lungs every 6 (six) hours as needed for wheezing or shortness of breath.)   apixaban (  ELIQUIS) 5 MG TABS tablet Take 1 tablet (5mg ) by mouth twice daily (Patient taking differently: Take 5 mg by mouth in the morning and at bedtime.)   atorvastatin (LIPITOR) 20 MG tablet Take 1 tablet (20 mg total) by mouth at bedtime.   carvedilol (COREG) 3.125 MG tablet Take 1 tablet (3.125 mg total) by mouth 2 (two) times daily with a meal.   cilostazol (PLETAL) 100 MG tablet Take 100 mg by mouth 2 (two) times daily.   empagliflozin (JARDIANCE) 10 MG TABS tablet Take 1 tablet (10 mg total) by mouth daily.   furosemide (LASIX) 40 MG tablet Take 1  tablet (40 mg total) by mouth daily.   hydrocortisone 1 % ointment Apply 1 Application topically 2 (two) times daily. (Patient taking differently: Apply 1 Application topically See admin instructions. Apply to affected areas 2 times a day)   hydrOXYzine (ATARAX) 10 MG tablet Take 1 tablet (10 mg total) by mouth at bedtime as needed for anxiety (sleep).   Iron-Vitamins (GERITOL COMPLETE) TABS Take 1 tablet by mouth daily with breakfast.   magic mouthwash (nystatin, lidocaine, diphenhydrAMINE, alum & mag hydroxide) suspension Swish and spit 5 mLs 4 (four) times daily as needed for up to 10 days for mouth pain.   nicotine polacrilex (NICORETTE) 2 MG gum Take 1 each (2 mg total) by mouth as needed for smoking cessation.   pantoprazole (PROTONIX) 40 MG tablet Take 1 tablet (40 mg total) by mouth daily.   predniSONE (DELTASONE) 10 MG tablet Take 6 tablets (60 mg total) by mouth daily with breakfast for 4 days, THEN 5 tablets (50 mg total) daily with breakfast for 7 days, THEN 4 tablets (40 mg total) daily with breakfast for 7 days, THEN 3 tablets (30 mg total) daily with breakfast for 7 days, THEN 2 tablets (20 mg total) daily with breakfast for 7 days, THEN 1 tablet (10 mg total) daily with breakfast for 7 days, THEN 0.5 tablets (5 mg total) daily with breakfast for 7 days.   spironolactone (ALDACTONE) 25 MG tablet Take 0.5 tablets (12.5 mg total) by mouth daily.   Tiotropium Bromide Monohydrate (SPIRIVA RESPIMAT) 2.5 MCG/ACT AERS Inhale 2 puffs into the lungs daily.   [DISCONTINUED] losartan (COZAAR) 25 MG tablet Take 1 tablet (25 mg total) by mouth daily.     Allergies:   Famotidine   Social History   Socioeconomic History   Marital status: Divorced    Spouse name: Not on file   Number of children: 1   Years of education: Not on file   Highest education level: Not on file  Occupational History   Not on file  Tobacco Use   Smoking status: Every Day    Current packs/day: 0.25    Average  packs/day: 0.3 packs/day for 45.0 years (11.3 ttl pk-yrs)    Types: Cigarettes   Smokeless tobacco: Never   Tobacco comments:    2 cigs per day   Vaping Use   Vaping status: Never Used  Substance and Sexual Activity   Alcohol use: Yes    Alcohol/week: 2.0 standard drinks of alcohol    Types: 2 Cans of beer per week    Comment: socially    Drug use: Not Currently    Types: Marijuana    Comment: 2 days ago    Sexual activity: Yes    Birth control/protection: Post-menopausal  Other Topics Concern   Not on file  Social History Narrative   Not on file   Social  Determinants of Health   Financial Resource Strain: Low Risk  (03/13/2022)   Overall Financial Resource Strain (CARDIA)    Difficulty of Paying Living Expenses: Not hard at all  Food Insecurity: No Food Insecurity (09/23/2022)   Hunger Vital Sign    Worried About Running Out of Food in the Last Year: Never true    Ran Out of Food in the Last Year: Never true  Transportation Needs: No Transportation Needs (09/23/2022)   PRAPARE - Administrator, Civil Service (Medical): No    Lack of Transportation (Non-Medical): No  Physical Activity: Inactive (03/13/2022)   Exercise Vital Sign    Days of Exercise per Week: 0 days    Minutes of Exercise per Session: 0 min  Stress: No Stress Concern Present (03/13/2022)   [employer] of Occupational Health - Occupational Stress Questionnaire    Feeling of Stress : Not at all  Social Connections: Moderately Isolated (03/13/2022)   Social Connection and Isolation Panel [NHANES]    Frequency of Communication with Friends and Family: More than three times a week    Frequency of Social Gatherings with Friends and Family: More than three times a week    Attends Religious Services: More than 4 times per year    Active Member of [employer] or Organizations: No    Attends Banker Meetings: Never    Marital Status: Divorced     Family History: The patient's family history  includes Asthma in her mother; Cancer in her maternal grandmother and mother; Cancer - Prostate in her father.  ROS:   Please see the history of present illness.     All other systems reviewed and are negative.  EKGs/Labs/Other Studies Reviewed:    The following studies were reviewed today:   EKG:   09/29/22: NSR, rate 87, anteroseptal Q waves and ST elevation (present on prior EKG)  Recent Labs: 08/15/2022: TSH 1.255 09/20/2022: Magnesium 2.5 09/25/2022: ALT 266; B Natriuretic Peptide 112.2; BUN 24; Creatinine, Ser 0.95; Hemoglobin 11.9; Platelets 357; Potassium 3.0; Sodium 136  Recent Lipid Panel    Component Value Date/Time   CHOL 159 09/13/2022 0727   TRIG 95 09/13/2022 0727   HDL 53 09/13/2022 0727   CHOLHDL 3.0 09/13/2022 0727   VLDL 19 09/13/2022 0727   LDLCALC 87 09/13/2022 0727   LDLDIRECT 72.0 01/17/2019 1020    Physical Exam:    VS:  BP 92/62 (BP Location: Left Arm, Patient Position: Sitting, Cuff Size: Normal)   Pulse 87   Ht 5\' 6"  (1.676 m)   Wt 130 lb 3.2 oz (59.1 kg)   SpO2 94%   BMI 21.01 kg/m     Wt Readings from Last 3 Encounters:  09/29/22 130 lb 3.2 oz (59.1 kg)  09/25/22 133 lb 6.1 oz (60.5 kg)  09/20/22 133 lb 6.1 oz (60.5 kg)     GEN:  Well nourished, well developed in no acute distress HEENT: Normal NECK: No JVD; No carotid bruits CARDIAC: RRR, no murmurs, rubs, gallops RESPIRATORY:  Clear to auscultation without rales, wheezing or rhonchi  ABDOMEN: Soft, non-tender, non-distended MUSCULOSKELETAL:  No edema; No deformity  SKIN: Warm and dry NEUROLOGIC:  Alert and oriented x 3 PSYCHIATRIC:  Normal affect   ASSESSMENT:    1. Other acute myocarditis   2. Shortness of breath   3. Acute combined systolic and diastolic heart failure (HCC)   4. Portal vein thrombosis    PLAN:    Immune checkpoint inhibitor myocarditis: She  was treated with tremelimumab and durvalumab on 08/15/2022.  She was admitted 09/08/2022 with NSTEMI.  Troponin peak 483.   Echo 7/30 showed EF 60 to 65%, normal RV function.  Cath 7/31 showed mild nonobstructive CAD.  She was discharged and presented back on 8/3 with chest pain and troponin elevation to 1521.  Echo 8/3 shows EF 50 to 55%, mild RV dysfunction.  Suspected checkpoint inhibitor myocarditis, as durvalumab has been associated with myocarditis.  CMR 8/5 consistent with acute myocarditis, LVEF 35%, RVEF 30% -Holding further durvalumab and  tremelimumab -For checkpoint inhibitor myocarditis, recommended treatment is IV Solu-Medrol daily x 3-5 days, then 1mg /kg p.o. prednisone daily, and taper over 4 to 6 weeks.  She seemed to respond to steroids during admission, chest pain resolved and troponin downtrending.  Transitioned to p.o. prednisone 60 mg daily on 8/7 with plans for slow wean over next 6 weeks.  Currently on prednisone 40 mg daily, will continue taper -Suspect component of pericarditis as well, as reports chest pain worsened with deep inspiration or lying flat.  Added colchicine 0.6 mg daily, would continue for 40-month course   Acute combined heart failure: in setting of suspected myocarditis as above. Echo on admission showed EF 50-55%.  CMR 8/5 showed LVEF 35%, RVEF 30%.   -Continue PO lasix 40 mg daily, appears euvolemic.  Check CMET, magnesium -Continue losartan, will reduce dose to 12.5 mg daily given soft BP -Continue spironolactone 12.5 mg daily -Continue carvedilol 3.125 mg twice daily -Continue Jardiance 10 mg daily   Portal vein thrombosis: Continue Eliquis   Hepatocellular carcinoma: Follows with oncology   RTC in 1 month.  Scheduled appointment with Dr. Wyline Mood who specializes in cardio-oncology   Medication Adjustments/Labs and Tests Ordered: Current medicines are reviewed at length with the patient today.  Concerns regarding medicines are outlined above.  Orders Placed This Encounter  Procedures   Comprehensive metabolic panel   Magnesium   EKG 12-Lead   Meds ordered this  encounter  Medications   losartan (COZAAR) 25 MG tablet    Sig: Take 0.5 tablets (12.5 mg total) by mouth daily.    Dispense:  45 tablet    Refill:  3    Patient Instructions  Medication Instructions:  Decrease Losartan to 12.5 mg daily *If you need a refill on your cardiac medications before your next appointment, please call your pharmacy*   Lab Work: CMP, Mag- today If you have labs (blood work) drawn today and your tests are completely normal, you will receive your results only by: MyChart Message (if you have MyChart) OR A paper copy in the mail If you have any lab test that is abnormal or we need to change your treatment, we will call you to review the results.  Follow-Up: At [facility], you and your health needs are our priority.  As part of our continuing mission to provide you with exceptional heart care, we have created designated Provider Care Teams.  These Care Teams include your primary Cardiologist (physician) and Advanced Practice Providers (APPs -  Physician Assistants and Nurse Practitioners) who all work together to provide you with the care you need, when you need it.  We recommend signing up for the patient portal called "MyChart".  Sign up information is provided on this After Visit Summary.  MyChart is used to connect with patients for Virtual Visits (Telemedicine).  Patients are able to view lab/test results, encounter notes, upcoming appointments, etc.  Non-urgent messages can be sent to your  provider as well.   To learn more about what you can do with MyChart, go to [email or web address].    Your next appointment:    Already scheduled appointment with Dr Wyline Mood 10/30/22 at 08:40     Signed, Little Ishikawa, MD  09/29/2022 10:23 AM    [facility]

## 2022-09-29 ENCOUNTER — Ambulatory Visit: Payer: Medicare HMO | Attending: Cardiology | Admitting: Cardiology

## 2022-09-29 ENCOUNTER — Inpatient Hospital Stay: Payer: Medicare HMO | Admitting: Family

## 2022-09-29 ENCOUNTER — Encounter: Payer: Self-pay | Admitting: Cardiology

## 2022-09-29 VITALS — BP 92/62 | HR 87 | Ht 66.0 in | Wt 130.2 lb

## 2022-09-29 DIAGNOSIS — I81 Portal vein thrombosis: Secondary | ICD-10-CM

## 2022-09-29 DIAGNOSIS — I408 Other acute myocarditis: Secondary | ICD-10-CM

## 2022-09-29 DIAGNOSIS — I214 Non-ST elevation (NSTEMI) myocardial infarction: Secondary | ICD-10-CM | POA: Diagnosis not present

## 2022-09-29 DIAGNOSIS — I5041 Acute combined systolic (congestive) and diastolic (congestive) heart failure: Secondary | ICD-10-CM | POA: Diagnosis not present

## 2022-09-29 DIAGNOSIS — R0602 Shortness of breath: Secondary | ICD-10-CM | POA: Diagnosis not present

## 2022-09-29 MED ORDER — LOSARTAN POTASSIUM 25 MG PO TABS: 12.5000 mg | ORAL_TABLET | Freq: Every day | ORAL | 3 refills | Status: DC

## 2022-09-29 NOTE — Patient Instructions (Addendum)
Medication Instructions:  Decrease Losartan to 12.5 mg daily *If you need a refill on your cardiac medications before your next appointment, please call your pharmacy*   Lab Work: CMP, Mag- today If you have labs (blood work) drawn today and your tests are completely normal, you will receive your results only by: MyChart Message (if you have MyChart) OR A paper copy in the mail If you have any lab test that is abnormal or we need to change your treatment, we will call you to review the results.  Follow-Up: At [facility], you and your health needs are our priority.  As part of our continuing mission to provide you with exceptional heart care, we have created designated Provider Care Teams.  These Care Teams include your primary Cardiologist (physician) and Advanced Practice Providers (APPs -  Physician Assistants and Nurse Practitioners) who all work together to provide you with the care you need, when you need it.  We recommend signing up for the patient portal called "MyChart".  Sign up information is provided on this After Visit Summary.  MyChart is used to connect with patients for Virtual Visits (Telemedicine).  Patients are able to view lab/test results, encounter notes, upcoming appointments, etc.  Non-urgent messages can be sent to your provider as well.   To learn more about what you can do with MyChart, go to [email or web address].    Your next appointment:    Already scheduled appointment with Dr Wyline Mood 10/30/22 at 08:40

## 2022-09-30 ENCOUNTER — Encounter: Payer: Self-pay | Admitting: Hematology

## 2022-09-30 ENCOUNTER — Inpatient Hospital Stay (HOSPITAL_BASED_OUTPATIENT_CLINIC_OR_DEPARTMENT_OTHER): Payer: Medicare HMO | Admitting: Hematology

## 2022-09-30 ENCOUNTER — Inpatient Hospital Stay: Payer: Medicare HMO

## 2022-09-30 ENCOUNTER — Other Ambulatory Visit: Payer: Self-pay

## 2022-09-30 VITALS — BP 96/69 | HR 98 | Temp 97.8°F | Resp 16 | Ht 66.0 in | Wt 130.8 lb

## 2022-09-30 DIAGNOSIS — B182 Chronic viral hepatitis C: Secondary | ICD-10-CM | POA: Diagnosis not present

## 2022-09-30 DIAGNOSIS — Z48812 Encounter for surgical aftercare following surgery on the circulatory system: Secondary | ICD-10-CM | POA: Diagnosis not present

## 2022-09-30 DIAGNOSIS — I2510 Atherosclerotic heart disease of native coronary artery without angina pectoris: Secondary | ICD-10-CM | POA: Diagnosis not present

## 2022-09-30 DIAGNOSIS — E876 Hypokalemia: Secondary | ICD-10-CM

## 2022-09-30 DIAGNOSIS — I110 Hypertensive heart disease with heart failure: Secondary | ICD-10-CM | POA: Diagnosis not present

## 2022-09-30 DIAGNOSIS — Z79899 Other long term (current) drug therapy: Secondary | ICD-10-CM | POA: Diagnosis not present

## 2022-09-30 DIAGNOSIS — C220 Liver cell carcinoma: Secondary | ICD-10-CM | POA: Diagnosis not present

## 2022-09-30 DIAGNOSIS — I739 Peripheral vascular disease, unspecified: Secondary | ICD-10-CM | POA: Diagnosis not present

## 2022-09-30 DIAGNOSIS — I408 Other acute myocarditis: Secondary | ICD-10-CM

## 2022-09-30 DIAGNOSIS — I5041 Acute combined systolic (congestive) and diastolic (congestive) heart failure: Secondary | ICD-10-CM | POA: Diagnosis not present

## 2022-09-30 DIAGNOSIS — R070 Pain in throat: Secondary | ICD-10-CM | POA: Diagnosis not present

## 2022-09-30 DIAGNOSIS — J449 Chronic obstructive pulmonary disease, unspecified: Secondary | ICD-10-CM | POA: Diagnosis not present

## 2022-09-30 DIAGNOSIS — I214 Non-ST elevation (NSTEMI) myocardial infarction: Secondary | ICD-10-CM | POA: Diagnosis not present

## 2022-09-30 DIAGNOSIS — I319 Disease of pericardium, unspecified: Secondary | ICD-10-CM | POA: Diagnosis not present

## 2022-09-30 DIAGNOSIS — I514 Myocarditis, unspecified: Secondary | ICD-10-CM | POA: Diagnosis not present

## 2022-09-30 DIAGNOSIS — R490 Dysphonia: Secondary | ICD-10-CM | POA: Diagnosis not present

## 2022-09-30 LAB — CMP (CANCER CENTER ONLY)
ALT: 267 U/L — ABNORMAL HIGH (ref 0–44)
AST: 91 U/L — ABNORMAL HIGH (ref 15–41)
Albumin: 3.4 g/dL — ABNORMAL LOW (ref 3.5–5.0)
Alkaline Phosphatase: 211 U/L — ABNORMAL HIGH (ref 38–126)
Anion gap: 6 (ref 5–15)
BUN: 22 mg/dL (ref 8–23)
CO2: 29 mmol/L (ref 22–32)
Calcium: 8.7 mg/dL — ABNORMAL LOW (ref 8.9–10.3)
Chloride: 103 mmol/L (ref 98–111)
Creatinine: 0.87 mg/dL (ref 0.44–1.00)
GFR, Estimated: >60 mL/min (ref >60)
Glucose, Bld: 110 mg/dL — ABNORMAL HIGH (ref 70–99)
Potassium: 3.4 mmol/L — ABNORMAL LOW (ref 3.5–5.1)
Sodium: 138 mmol/L (ref 135–145)
Total Bilirubin: 0.6 mg/dL (ref 0.3–1.2)
Total Protein: 6.5 g/dL (ref 6.5–8.1)

## 2022-09-30 LAB — COMPREHENSIVE METABOLIC PANEL
ALT: 335 IU/L — ABNORMAL HIGH (ref 0–32)
AST: 117 IU/L — ABNORMAL HIGH (ref 0–40)
Albumin: 3.6 g/dL — ABNORMAL LOW (ref 3.8–4.8)
Alkaline Phosphatase: 278 IU/L — ABNORMAL HIGH (ref 44–121)
BUN/Creatinine Ratio: 27 (ref 12–28)
BUN: 24 mg/dL (ref 8–27)
Bilirubin Total: 0.4 mg/dL (ref 0.0–1.2)
CO2: 24 mmol/L (ref 20–29)
Calcium: 9.1 mg/dL (ref 8.7–10.3)
Chloride: 101 mmol/L (ref 96–106)
Creatinine, Ser: 0.9 mg/dL (ref 0.57–1.00)
Globulin, Total: 2.7 g/dL (ref 1.5–4.5)
Glucose: 119 mg/dL — ABNORMAL HIGH (ref 70–99)
Potassium: 3.6 mmol/L (ref 3.5–5.2)
Sodium: 139 mmol/L (ref 134–144)
Total Protein: 6.3 g/dL (ref 6.0–8.5)
eGFR: 68 mL/min/{1.73_m2} (ref >59)

## 2022-09-30 LAB — CBC WITH DIFFERENTIAL (CANCER CENTER ONLY)
Abs Immature Granulocytes: 0.02 10*3/uL (ref 0.00–0.07)
Basophils Absolute: 0 10*3/uL (ref 0.0–0.1)
Basophils Relative: 0 %
Eosinophils Absolute: 0 10*3/uL (ref 0.0–0.5)
Eosinophils Relative: 0 %
HCT: 36 % (ref 36.0–46.0)
Hemoglobin: 12 g/dL (ref 12.0–15.0)
Immature Granulocytes: 0 %
Lymphocytes Relative: 10 %
Lymphs Abs: 0.7 10*3/uL (ref 0.7–4.0)
MCH: 29.6 pg (ref 26.0–34.0)
MCHC: 33.3 g/dL (ref 30.0–36.0)
MCV: 88.7 fL (ref 80.0–100.0)
Monocytes Absolute: 0.3 10*3/uL (ref 0.1–1.0)
Monocytes Relative: 4 %
Neutro Abs: 6.2 10*3/uL (ref 1.7–7.7)
Neutrophils Relative %: 86 %
Platelet Count: 218 10*3/uL (ref 150–400)
RBC: 4.06 MIL/uL (ref 3.87–5.11)
RDW: 16.2 % — ABNORMAL HIGH (ref 11.5–15.5)
WBC Count: 7.3 10*3/uL (ref 4.0–10.5)
nRBC: 0 % (ref 0.0–0.2)

## 2022-09-30 LAB — MAGNESIUM: Magnesium: 2.3 mg/dL (ref 1.6–2.3)

## 2022-09-30 MED ORDER — POTASSIUM CHLORIDE CRYS ER 20 MEQ PO TBCR: 20.0000 meq | EXTENDED_RELEASE_TABLET | Freq: Every day | ORAL | 3 refills | Status: DC

## 2022-09-30 NOTE — Assessment & Plan Note (Addendum)
cT3N0M0, stage IIIA, BCLC advanced stage (C) -Patient was hospitalized for colitis on 07/18/2022, CT scan showed multiple liver masses -Liver MRI on July 19, 2022 showed extensive tumor identified throughout both lobes of the liver, radiographic characteristic are not consistent with hepatocellular carcinoma, her AFP was 84, with her history of hepatitis C, this is diagnostic for multifocal HCC or HCC with intrahepatic metastasis -I do not think she needs liver biopsy. -I will review her case in our GI tumor board next week. -I discussed the incurable nature of her liver cancer, due to the diffuse involvement of both lobes.  She is not a candidate for liver resection or liver transplant. 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  She responded to steroid which started on 09/14/2022, will gradually taper her prednisone off in the next 6-7 weeks.  -I do not plan to continue immunotherapy.  I discussed other treatment options, especially TKI's, such as Lenvima.  Will wait until she recovers well from myocarditis. -I have also referred her to IR to consider liver targeted therapy. Will hold it for now due to her myocarditis and not doing well overall clinically.

## 2022-09-30 NOTE — Progress Notes (Signed)
[facility]   [phone] [phone]   Clinic Follow up Note   Patient Care Team: Willow Ora, MD as PCP - General (Family Medicine) Chilton Si, MD as PCP - Cardiology (Cardiology) Willis Modena, MD as Consulting Physician (Gastroenterology) Aura Camps, MD as Consulting Physician (Ophthalmology) Aris Lot, MD as Consulting Physician (Dermatology) Dahlia Byes, [facility] (Inactive) as Pharmacist (Pharmacist) Malachy Mood, MD as Consulting Physician (Oncology)  Date of Service:  09/30/2022  CHIEF COMPLAINT: f/u of Hepatocellular Carcinoma   CURRENT THERAPY:  Tremelimumab-actl D1 C1 + Durvalumab q28d   ASSESSMENT:  Christine Hurley is a 72 y.o. female with   Hepatocellular carcinoma (HCC) cT3N0M0, stage IIIA, BCLC advanced stage (C) -Patient was hospitalized for colitis on 07/18/2022, CT scan showed multiple liver masses -Liver MRI on July 19, 2022 showed extensive tumor identified throughout both lobes of the liver, radiographic characteristic are not consistent with hepatocellular carcinoma, her AFP was 85, with her history of hepatitis C, this is diagnostic for multifocal HCC or HCC with intrahepatic metastasis -I do not think she needs liver biopsy. -I will review her case in our GI tumor board next week. -I discussed the incurable nature of her liver cancer, due to the diffuse involvement of both lobes.  She is not a candidate for liver resection or liver transplant. 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  She responded to steroid which started on 09/14/2022, will gradually taper her prednisone off in the next 6-7 weeks.  -I do not plan to continue immunotherapy.  I discussed other treatment options, especially TKI's, such as Lenvima.  Will wait until she recovers well from myocarditis. -I  have also referred her to IR to consider liver targeted therapy. Will hold it for now due to her myocarditis and not doing well overall clinically.  Chronic hepatitis C without hepatic coma (HCC) -previously treated  --Will monitor.  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  Sore throat and horseness  -started in recent hospital stay, had barium swallow test  -Oral exam today and unremarkable -She has tried Magic mouthwash, did not help. -We discussed ENT referral, will hold it for now.   PLAN: -lab reviewed -CMP-pending -Continue monitoring BP at home -Continue tapering prednisone -no cancer treatment for now  -lab and f/u in 1 month  SUMMARY OF ONCOLOGIC HISTORY: Oncology History Overview Note   Cancer Staging  Hepatocellular carcinoma [facility]) Staging form: Liver, AJCC 8th Edition - Clinical stage from 07/24/2022: Stage IIIA (cT3, cN0, cM0) - Signed by Malachy Mood, MD on 07/24/2022     Hepatocellular carcinoma (HCC)  07/24/2022 Initial Diagnosis   Hepatocellular carcinoma (HCC)   07/24/2022 Cancer Staging   Staging form: Liver, AJCC 8th Edition - Clinical stage from 07/24/2022: Stage IIIA (cT3, cN0, cM0) - Signed by Malachy Mood, MD on 07/24/2022   08/15/2022 -  Chemotherapy   Patient is on Treatment Plan : Hepatocellular Carcinoma Tremelimumab-actl C1 D1 + Durvalumab q28d         INTERVAL HISTORY:  Christine  Hurley is here for a follow up of Hepatocellular Carcinoma. She was last seen by me on 08/15/2022. She presents to the clinic alone. Pt state that she have some  pain in her throat and she has lost her voice. Pt state that when she blows in the spirometer she gags and becomes SOB. Pt state that she chokes when she tries to swallow. Pt state that she went to see her cardiologist  on yesterday 8/19.Pt state that she can't lay flat with out having SOB.      All other systems were reviewed with the patient and are negative.  MEDICAL HISTORY:  Past Medical History:  Diagnosis Date   Abnormal nuclear stress test February '13   Small, partially reversible inferolateral ischemia. Marked hypertensive response to exercise. Low risk.   Arthritis    Chest pain with minimal risk for cardiac etiology February 2013   Evaluated with echocardiogram and Myoview, do   COPD (chronic obstructive pulmonary disease) (HCC)    Depression    Hepatitis C    Hypertension    Osteopenia of spine 05/22/2016   PAD (peripheral artery disease) (HCC) 10/01/2021   ABI: monophasic and decreased ABIs. Refer to vascular   Right tibial fracture      SURGICAL HISTORY: Past Surgical History:  Procedure Laterality Date   DOPPLER ECHOCARDIOGRAPHY  03/26/2011   LVEF>55% normal LV wall thickness, normal LA, mild aortic sclerosis with trace to mild AI, mild to moderate TR, RVSP of , RA pressure about 5 mmHg   LEFT HEART CATH AND CORONARY ANGIOGRAPHY N/A 09/10/2022   Procedure: LEFT HEART CATH AND CORONARY ANGIOGRAPHY;  Surgeon: [place], Peter M, MD;  Location: [facility] INVASIVE CV LAB;  Service: Cardiovascular;  Laterality: N/A;   NM MYOVIEW LTD  03/27/11   post stress ejection fraction is 72%, abnormal myocardial perfusion study, this is a low risk scan   TIBIA FRACTURE SURGERY Right 09/2008   fracture of right lower leg with open reduction and internal fixation    I have reviewed the social history and family history with the patient and they are unchanged from previous note.  ALLERGIES:  is allergic to famotidine.  MEDICATIONS:  Current Outpatient Medications  Medication Sig  Dispense Refill   albuterol (VENTOLIN HFA) 108 (90 Base) MCG/ACT inhaler INHALE 1 TO 2 PUFFS INTO THE LUNGS EVERY 6 HOURS AS NEEDED FOR WHEEZING OR SHORTNESS OF BREATH (Patient taking differently: Inhale 2 puffs into the lungs every 6 (six) hours as needed for wheezing or shortness of breath.) 6.7 g 2   apixaban (ELIQUIS) 5 MG TABS tablet Take 1 tablet (5mg ) by mouth twice daily (Patient taking differently: Take 5 mg by mouth in the morning and at bedtime.) 90 tablet 1   carvedilol (COREG) 3.125 MG tablet Take 1 tablet (3.125 mg total) by mouth 2 (two) times daily with a meal. 30 tablet 2   cilostazol (PLETAL) 100 MG tablet Take 100 mg by mouth 2 (two) times daily.     colchicine 0.6 MG tablet Take 1 tablet (0.6 mg total) by mouth daily for 7 days. 7 tablet 0   empagliflozin (JARDIANCE) 10 MG TABS tablet Take 1 tablet (10 mg total) by mouth daily. 30 tablet 2   furosemide (LASIX) 40 MG tablet Take 1 tablet (40 mg total) by mouth daily. 30 tablet 2   hydrocortisone 1 % ointment Apply 1 Application topically 2 (two) times daily. (Patient taking differently: Apply 1 Application topically See admin instructions. Apply to affected areas  2 times a day) 30 g 0   hydrOXYzine (ATARAX) 10 MG tablet Take 1 tablet (10 mg total) by mouth at bedtime as needed for anxiety (sleep). 30 tablet 0   Iron-Vitamins (GERITOL COMPLETE) TABS Take 1 tablet by mouth daily with breakfast.     losartan (COZAAR) 25 MG tablet Take 0.5 tablets (12.5 mg total) by mouth daily. 45 tablet 3   magic mouthwash (nystatin, lidocaine, diphenhydrAMINE, alum & mag hydroxide) suspension Swish and spit 5 mLs 4 (four) times daily as needed for up to 10 days for mouth pain. 180 mL 0   nicotine polacrilex (NICORETTE) 2 MG gum Take 1 each (2 mg total) by mouth as needed for smoking cessation. 100 tablet 0   pantoprazole (PROTONIX) 40 MG tablet Take 1 tablet (40 mg total) by mouth daily. 30 tablet 1   potassium chloride SA (KLOR-CON M) 20 MEQ tablet  Take 1 tablet (20 mEq total) by mouth daily. 90 tablet 3   predniSONE (DELTASONE) 10 MG tablet Take 6 tablets (60 mg total) by mouth daily with breakfast for 4 days, THEN 5 tablets (50 mg total) daily with breakfast for 7 days, THEN 4 tablets (40 mg total) daily with breakfast for 7 days, THEN 3 tablets (30 mg total) daily with breakfast for 7 days, THEN 2 tablets (20 mg total) daily with breakfast for 7 days, THEN 1 tablet (10 mg total) daily with breakfast for 7 days, THEN 0.5 tablets (5 mg total) daily with breakfast for 7 days. 30 tablet 0   spironolactone (ALDACTONE) 25 MG tablet Take 0.5 tablets (12.5 mg total) by mouth daily. 15 tablet 2   Tiotropium Bromide Monohydrate (SPIRIVA RESPIMAT) 2.5 MCG/ACT AERS Inhale 2 puffs into the lungs daily. 4 g 1   No current facility-administered medications for this visit.    PHYSICAL EXAMINATION: ECOG PERFORMANCE STATUS: 2 - Symptomatic, <50% confined to bed  Vitals:   09/30/22 1353  BP: 96/69  Pulse: 98  Resp: 16  Temp: 97.8 F (36.6 C)  SpO2: 100%   Wt Readings from Last 3 Encounters:  09/30/22 130 lb 12.8 oz (59.3 kg)  09/29/22 130 lb 3.2 oz (59.1 kg)  09/25/22 133 lb 6.1 oz (60.5 kg)     GENERAL:alert, no distress and comfortable SKIN: skin color normal, no rashes or significant lesions EYES: normal, Conjunctiva are pink and non-injected, sclera clear  NEURO: alert & oriented x 3 with fluent speech LUNGS: (-)clear to auscultation and percussion with breathing low on the bottom. ABDOMEN:(-)abdomen soft, (-)non-tender and normal bowel sounds   LABORATORY DATA:  I have reviewed the data as listed    Latest Ref Rng & Units 09/30/2022    1:34 PM 09/25/2022    2:10 PM 09/20/2022    2:06 AM  CBC  WBC 4.0 - 10.5 K/uL 7.3  10.7  12.2   Hemoglobin 12.0 - 15.0 g/dL 36.6  44.0  34.7   Hematocrit 36.0 - 46.0 % 36.0  36.6  37.5   Platelets 150 - 400 K/uL 218  357  354         Latest Ref Rng & Units 09/30/2022    1:34 PM 09/29/2022    10:47 AM 09/25/2022    2:10 PM  CMP  Glucose 70 - 99 mg/dL 425  956  387   BUN 8 - 23 mg/dL 22  24  24    Creatinine 0.44 - 1.00 mg/dL 5.64  3.32  9.51   Sodium 135 - 145 mmol/L  138  139  136   Potassium 3.5 - 5.1 mmol/L 3.4  3.6  3.0   Chloride 98 - 111 mmol/L 103  101  103   CO2 22 - 32 mmol/L 29  24  22    Calcium 8.9 - 10.3 mg/dL 8.7  9.1  8.3   Total Protein 6.5 - 8.1 g/dL 6.5  6.3  6.1   Total Bilirubin 0.3 - 1.2 mg/dL 0.6  0.4  0.5   Alkaline Phos 38 - 126 U/L 211  278  227   AST 15 - 41 U/L 91  117  91   ALT 0 - 44 U/L 267  335  266       RADIOGRAPHIC STUDIES: I have personally reviewed the radiological images as listed and agreed with the findings in the report. No results found.    No orders of the defined types were placed in this encounter.  All questions were answered. The patient knows to call the clinic with any problems, questions or concerns. No barriers to learning was detected. The total time spent in the appointment was 30 minutes.     Malachy Mood, MD 09/30/2022   Carolin Coy, CMA, am acting as scribe for Malachy Mood, MD.   I have reviewed the above documentation for accuracy and completeness, and I agree with the above.

## 2022-09-30 NOTE — Assessment & Plan Note (Signed)
-  Most likely secondary to her immunotherapy -She was started on high-dose steroids in the hospital on September 14, 2022, discharged on prednisone 60mg  daily last week, will gradually taper off in the next 5-6 weeks (drop 10mg  every week) -Follow-up with cardiology closely -Unfortunately immunotherapy related myocarditis has very high mortality.

## 2022-09-30 NOTE — Assessment & Plan Note (Signed)
-  previously treated  --Will monitor.  Hepatitis C virus RNA was negative  -Recent MRI showed no definitive evidence of liver cirrhosis,

## 2022-10-01 LAB — AFP TUMOR MARKER: AFP, Serum, Tumor Marker: 48.7 ng/mL — ABNORMAL HIGH (ref 0.0–9.2)

## 2022-10-07 DIAGNOSIS — E876 Hypokalemia: Secondary | ICD-10-CM | POA: Diagnosis not present

## 2022-10-08 ENCOUNTER — Telehealth: Payer: Self-pay | Admitting: Family Medicine

## 2022-10-08 ENCOUNTER — Encounter: Payer: Self-pay | Admitting: Hematology

## 2022-10-08 LAB — BASIC METABOLIC PANEL
BUN/Creatinine Ratio: 16 (ref 12–28)
BUN: 13 mg/dL (ref 8–27)
CO2: 22 mmol/L (ref 20–29)
Calcium: 9.2 mg/dL (ref 8.7–10.3)
Chloride: 102 mmol/L (ref 96–106)
Creatinine, Ser: 0.83 mg/dL (ref 0.57–1.00)
Glucose: 119 mg/dL — ABNORMAL HIGH (ref 70–99)
Potassium: 4.5 mmol/L (ref 3.5–5.2)
Sodium: 140 mmol/L (ref 134–144)
eGFR: 75 mL/min/{1.73_m2} (ref >59)

## 2022-10-08 NOTE — Telephone Encounter (Signed)
Patient dropped off document Home Health Certificate (Order ID [id number]), to be filled out by provider. Patient requested to send it back via Fax . Document is located in providers tray at front office.Please advise

## 2022-10-08 NOTE — Telephone Encounter (Signed)
Forms placed in andy's box

## 2022-10-09 ENCOUNTER — Other Ambulatory Visit: Payer: Self-pay | Admitting: Physician Assistant

## 2022-10-09 ENCOUNTER — Other Ambulatory Visit: Payer: Medicare HMO

## 2022-10-09 ENCOUNTER — Ambulatory Visit: Payer: Medicare HMO

## 2022-10-09 ENCOUNTER — Encounter: Payer: Medicare HMO | Admitting: Nutrition

## 2022-10-09 ENCOUNTER — Other Ambulatory Visit: Payer: Self-pay | Admitting: Family Medicine

## 2022-10-09 ENCOUNTER — Ambulatory Visit: Payer: Medicare HMO | Admitting: Hematology

## 2022-10-09 DIAGNOSIS — I10 Essential (primary) hypertension: Secondary | ICD-10-CM

## 2022-10-10 ENCOUNTER — Inpatient Hospital Stay: Payer: Medicare HMO | Admitting: Family Medicine

## 2022-10-11 ENCOUNTER — Inpatient Hospital Stay (HOSPITAL_COMMUNITY)
Admission: EM | Admit: 2022-10-11 | Discharge: 2022-10-12 | DRG: 871 | Disposition: E | Payer: Medicare HMO | Attending: Internal Medicine | Admitting: Internal Medicine

## 2022-10-11 ENCOUNTER — Encounter (HOSPITAL_COMMUNITY): Payer: Self-pay

## 2022-10-11 ENCOUNTER — Inpatient Hospital Stay (HOSPITAL_COMMUNITY): Payer: Medicare HMO

## 2022-10-11 ENCOUNTER — Other Ambulatory Visit: Payer: Self-pay

## 2022-10-11 ENCOUNTER — Encounter (HOSPITAL_COMMUNITY): Admission: EM | Disposition: E | Payer: Self-pay | Source: Home / Self Care | Attending: Internal Medicine

## 2022-10-11 ENCOUNTER — Emergency Department (HOSPITAL_COMMUNITY): Payer: Medicare HMO

## 2022-10-11 DIAGNOSIS — M8588 Other specified disorders of bone density and structure, other site: Secondary | ICD-10-CM | POA: Diagnosis present

## 2022-10-11 DIAGNOSIS — Z515 Encounter for palliative care: Secondary | ICD-10-CM

## 2022-10-11 DIAGNOSIS — Z888 Allergy status to other drugs, medicaments and biological substances status: Secondary | ICD-10-CM

## 2022-10-11 DIAGNOSIS — I63511 Cerebral infarction due to unspecified occlusion or stenosis of right middle cerebral artery: Secondary | ICD-10-CM

## 2022-10-11 DIAGNOSIS — E875 Hyperkalemia: Secondary | ICD-10-CM | POA: Diagnosis present

## 2022-10-11 DIAGNOSIS — I10 Essential (primary) hypertension: Secondary | ICD-10-CM | POA: Diagnosis present

## 2022-10-11 DIAGNOSIS — E162 Hypoglycemia, unspecified: Secondary | ICD-10-CM | POA: Diagnosis present

## 2022-10-11 DIAGNOSIS — E872 Acidosis, unspecified: Secondary | ICD-10-CM | POA: Diagnosis present

## 2022-10-11 DIAGNOSIS — I700 Atherosclerosis of aorta: Secondary | ICD-10-CM | POA: Diagnosis not present

## 2022-10-11 DIAGNOSIS — R4701 Aphasia: Secondary | ICD-10-CM | POA: Diagnosis present

## 2022-10-11 DIAGNOSIS — Z7189 Other specified counseling: Secondary | ICD-10-CM | POA: Diagnosis not present

## 2022-10-11 DIAGNOSIS — I739 Peripheral vascular disease, unspecified: Secondary | ICD-10-CM | POA: Diagnosis present

## 2022-10-11 DIAGNOSIS — J9601 Acute respiratory failure with hypoxia: Secondary | ICD-10-CM | POA: Diagnosis present

## 2022-10-11 DIAGNOSIS — Z7901 Long term (current) use of anticoagulants: Secondary | ICD-10-CM

## 2022-10-11 DIAGNOSIS — R5383 Other fatigue: Secondary | ICD-10-CM | POA: Diagnosis not present

## 2022-10-11 DIAGNOSIS — G8194 Hemiplegia, unspecified affecting left nondominant side: Secondary | ICD-10-CM | POA: Diagnosis present

## 2022-10-11 DIAGNOSIS — F32A Depression, unspecified: Secondary | ICD-10-CM | POA: Diagnosis present

## 2022-10-11 DIAGNOSIS — G9341 Metabolic encephalopathy: Secondary | ICD-10-CM

## 2022-10-11 DIAGNOSIS — Z8679 Personal history of other diseases of the circulatory system: Secondary | ICD-10-CM

## 2022-10-11 DIAGNOSIS — Z7902 Long term (current) use of antithrombotics/antiplatelets: Secondary | ICD-10-CM

## 2022-10-11 DIAGNOSIS — R6521 Severe sepsis with septic shock: Secondary | ICD-10-CM | POA: Diagnosis not present

## 2022-10-11 DIAGNOSIS — Z66 Do not resuscitate: Secondary | ICD-10-CM | POA: Diagnosis not present

## 2022-10-11 DIAGNOSIS — T451X5A Adverse effect of antineoplastic and immunosuppressive drugs, initial encounter: Secondary | ICD-10-CM | POA: Diagnosis present

## 2022-10-11 DIAGNOSIS — I959 Hypotension, unspecified: Secondary | ICD-10-CM | POA: Diagnosis not present

## 2022-10-11 DIAGNOSIS — I81 Portal vein thrombosis: Secondary | ICD-10-CM | POA: Diagnosis present

## 2022-10-11 DIAGNOSIS — I639 Cerebral infarction, unspecified: Secondary | ICD-10-CM | POA: Diagnosis not present

## 2022-10-11 DIAGNOSIS — A419 Sepsis, unspecified organism: Secondary | ICD-10-CM | POA: Diagnosis not present

## 2022-10-11 DIAGNOSIS — J690 Pneumonitis due to inhalation of food and vomit: Secondary | ICD-10-CM | POA: Diagnosis not present

## 2022-10-11 DIAGNOSIS — R000 Tachycardia, unspecified: Secondary | ICD-10-CM | POA: Diagnosis not present

## 2022-10-11 DIAGNOSIS — I517 Cardiomegaly: Secondary | ICD-10-CM | POA: Diagnosis not present

## 2022-10-11 DIAGNOSIS — R0689 Other abnormalities of breathing: Secondary | ICD-10-CM | POA: Diagnosis not present

## 2022-10-11 DIAGNOSIS — R531 Weakness: Secondary | ICD-10-CM | POA: Diagnosis present

## 2022-10-11 DIAGNOSIS — I671 Cerebral aneurysm, nonruptured: Secondary | ICD-10-CM | POA: Diagnosis not present

## 2022-10-11 DIAGNOSIS — I6300 Cerebral infarction due to thrombosis of unspecified precerebral artery: Secondary | ICD-10-CM

## 2022-10-11 DIAGNOSIS — I401 Isolated myocarditis: Secondary | ICD-10-CM | POA: Diagnosis present

## 2022-10-11 DIAGNOSIS — R471 Dysarthria and anarthria: Secondary | ICD-10-CM | POA: Diagnosis present

## 2022-10-11 DIAGNOSIS — N179 Acute kidney failure, unspecified: Secondary | ICD-10-CM | POA: Diagnosis not present

## 2022-10-11 DIAGNOSIS — Z79899 Other long term (current) drug therapy: Secondary | ICD-10-CM

## 2022-10-11 DIAGNOSIS — J440 Chronic obstructive pulmonary disease with acute lower respiratory infection: Secondary | ICD-10-CM | POA: Diagnosis present

## 2022-10-11 DIAGNOSIS — R918 Other nonspecific abnormal finding of lung field: Secondary | ICD-10-CM | POA: Diagnosis not present

## 2022-10-11 DIAGNOSIS — R0902 Hypoxemia: Secondary | ICD-10-CM | POA: Diagnosis not present

## 2022-10-11 DIAGNOSIS — C220 Liver cell carcinoma: Secondary | ICD-10-CM | POA: Diagnosis present

## 2022-10-11 DIAGNOSIS — R579 Shock, unspecified: Principal | ICD-10-CM

## 2022-10-11 DIAGNOSIS — F17210 Nicotine dependence, cigarettes, uncomplicated: Secondary | ICD-10-CM | POA: Diagnosis present

## 2022-10-11 DIAGNOSIS — J90 Pleural effusion, not elsewhere classified: Secondary | ICD-10-CM | POA: Diagnosis not present

## 2022-10-11 DIAGNOSIS — Z4682 Encounter for fitting and adjustment of non-vascular catheter: Secondary | ICD-10-CM | POA: Diagnosis not present

## 2022-10-11 DIAGNOSIS — M6259 Muscle wasting and atrophy, not elsewhere classified, multiple sites: Secondary | ICD-10-CM | POA: Diagnosis present

## 2022-10-11 DIAGNOSIS — I63411 Cerebral infarction due to embolism of right middle cerebral artery: Secondary | ICD-10-CM | POA: Diagnosis not present

## 2022-10-11 DIAGNOSIS — R29716 NIHSS score 16: Secondary | ICD-10-CM | POA: Diagnosis present

## 2022-10-11 DIAGNOSIS — R410 Disorientation, unspecified: Secondary | ICD-10-CM | POA: Diagnosis not present

## 2022-10-11 DIAGNOSIS — J189 Pneumonia, unspecified organism: Secondary | ICD-10-CM | POA: Diagnosis present

## 2022-10-11 DIAGNOSIS — I6523 Occlusion and stenosis of bilateral carotid arteries: Secondary | ICD-10-CM | POA: Diagnosis not present

## 2022-10-11 DIAGNOSIS — Z8619 Personal history of other infectious and parasitic diseases: Secondary | ICD-10-CM

## 2022-10-11 DIAGNOSIS — Z7984 Long term (current) use of oral hypoglycemic drugs: Secondary | ICD-10-CM

## 2022-10-11 DIAGNOSIS — Z8042 Family history of malignant neoplasm of prostate: Secondary | ICD-10-CM

## 2022-10-11 DIAGNOSIS — Z825 Family history of asthma and other chronic lower respiratory diseases: Secondary | ICD-10-CM

## 2022-10-11 DIAGNOSIS — R29818 Other symptoms and signs involving the nervous system: Secondary | ICD-10-CM | POA: Diagnosis not present

## 2022-10-11 DIAGNOSIS — Z8505 Personal history of malignant neoplasm of liver: Secondary | ICD-10-CM

## 2022-10-11 LAB — CBC WITH DIFFERENTIAL/PLATELET
Abs Immature Granulocytes: 0.01 10*3/uL (ref 0.00–0.07)
Basophils Absolute: 0 10*3/uL (ref 0.0–0.1)
Basophils Relative: 0 %
Eosinophils Absolute: 0 10*3/uL (ref 0.0–0.5)
Eosinophils Relative: 0 %
HCT: 31.8 % — ABNORMAL LOW (ref 36.0–46.0)
Hemoglobin: 10.1 g/dL — ABNORMAL LOW (ref 12.0–15.0)
Immature Granulocytes: 0 %
Lymphocytes Relative: 11 %
Lymphs Abs: 0.7 10*3/uL (ref 0.7–4.0)
MCH: 30.2 pg (ref 26.0–34.0)
MCHC: 31.8 g/dL (ref 30.0–36.0)
MCV: 95.2 fL (ref 80.0–100.0)
Monocytes Absolute: 0.6 10*3/uL (ref 0.1–1.0)
Monocytes Relative: 10 %
Neutro Abs: 4.7 10*3/uL (ref 1.7–7.7)
Neutrophils Relative %: 79 %
Platelets: 251 10*3/uL (ref 150–400)
RBC: 3.34 MIL/uL — ABNORMAL LOW (ref 3.87–5.11)
RDW: 17.6 % — ABNORMAL HIGH (ref 11.5–15.5)
WBC: 6 10*3/uL (ref 4.0–10.5)
nRBC: 1.3 % — ABNORMAL HIGH (ref 0.0–0.2)

## 2022-10-11 LAB — I-STAT CHEM 8, ED
BUN: 56 mg/dL — ABNORMAL HIGH (ref 8–23)
Calcium, Ion: 1.06 mmol/L — ABNORMAL LOW (ref 1.15–1.40)
Chloride: 109 mmol/L (ref 98–111)
Creatinine, Ser: 2.6 mg/dL — ABNORMAL HIGH (ref 0.44–1.00)
Glucose, Bld: 98 mg/dL (ref 70–99)
HCT: 31 % — ABNORMAL LOW (ref 36.0–46.0)
Hemoglobin: 10.5 g/dL — ABNORMAL LOW (ref 12.0–15.0)
Potassium: 5.7 mmol/L — ABNORMAL HIGH (ref 3.5–5.1)
Sodium: 136 mmol/L (ref 135–145)
TCO2: 17 mmol/L — ABNORMAL LOW (ref 22–32)

## 2022-10-11 LAB — COMPREHENSIVE METABOLIC PANEL
ALT: 309 U/L — ABNORMAL HIGH (ref 0–44)
AST: 211 U/L — ABNORMAL HIGH (ref 15–41)
Albumin: 2.6 g/dL — ABNORMAL LOW (ref 3.5–5.0)
Alkaline Phosphatase: 114 U/L (ref 38–126)
Anion gap: 15 (ref 5–15)
BUN: 58 mg/dL — ABNORMAL HIGH (ref 8–23)
CO2: 16 mmol/L — ABNORMAL LOW (ref 22–32)
Calcium: 7.8 mg/dL — ABNORMAL LOW (ref 8.9–10.3)
Chloride: 107 mmol/L (ref 98–111)
Creatinine, Ser: 2.26 mg/dL — ABNORMAL HIGH (ref 0.44–1.00)
GFR, Estimated: 23 mL/min — ABNORMAL LOW (ref >60)
Glucose, Bld: 100 mg/dL — ABNORMAL HIGH (ref 70–99)
Potassium: 5.4 mmol/L — ABNORMAL HIGH (ref 3.5–5.1)
Sodium: 138 mmol/L (ref 135–145)
Total Bilirubin: 0.8 mg/dL (ref 0.3–1.2)
Total Protein: 5.2 g/dL — ABNORMAL LOW (ref 6.5–8.1)

## 2022-10-11 LAB — I-STAT VENOUS BLOOD GAS, ED
Acid-base deficit: 11 mmol/L — ABNORMAL HIGH (ref 0.0–2.0)
Bicarbonate: 15.8 mmol/L — ABNORMAL LOW (ref 20.0–28.0)
Calcium, Ion: 1.07 mmol/L — ABNORMAL LOW (ref 1.15–1.40)
HCT: 32 % — ABNORMAL LOW (ref 36.0–46.0)
Hemoglobin: 10.9 g/dL — ABNORMAL LOW (ref 12.0–15.0)
O2 Saturation: 92 %
Potassium: 5.7 mmol/L — ABNORMAL HIGH (ref 3.5–5.1)
Sodium: 136 mmol/L (ref 135–145)
TCO2: 17 mmol/L — ABNORMAL LOW (ref 22–32)
pCO2, Ven: 39.3 mmHg — ABNORMAL LOW (ref 44–60)
pH, Ven: 7.213 — ABNORMAL LOW (ref 7.25–7.43)
pO2, Ven: 77 mmHg — ABNORMAL HIGH (ref 32–45)

## 2022-10-11 LAB — APTT: aPTT: 28 seconds (ref 24–36)

## 2022-10-11 LAB — ETHANOL: Alcohol, Ethyl (B): <10 mg/dL (ref <10)

## 2022-10-11 LAB — I-STAT CG4 LACTIC ACID, ED: Lactic Acid, Venous: 4 mmol/L (ref 0.5–1.9)

## 2022-10-11 LAB — PROCALCITONIN: Procalcitonin: 1.25 ng/mL

## 2022-10-11 LAB — TROPONIN I (HIGH SENSITIVITY)
Troponin I (High Sensitivity): 22 ng/L — ABNORMAL HIGH (ref <18)
Troponin I (High Sensitivity): 23 ng/L — ABNORMAL HIGH (ref <18)

## 2022-10-11 LAB — BRAIN NATRIURETIC PEPTIDE: B Natriuretic Peptide: 4138.1 pg/mL — ABNORMAL HIGH (ref 0.0–100.0)

## 2022-10-11 LAB — PROTIME-INR
INR: 1.5 — ABNORMAL HIGH (ref 0.8–1.2)
Prothrombin Time: 18.1 seconds — ABNORMAL HIGH (ref 11.4–15.2)

## 2022-10-11 LAB — AMMONIA: Ammonia: 44 umol/L — ABNORMAL HIGH (ref 9–35)

## 2022-10-11 LAB — CBG MONITORING, ED: Glucose-Capillary: 43 mg/dL — CL (ref 70–99)

## 2022-10-11 SURGERY — LEFT HEART CATH AND CORONARY ANGIOGRAPHY
Anesthesia: LOCAL

## 2022-10-11 MED ORDER — ETOMIDATE 2 MG/ML IV SOLN
INTRAVENOUS | Status: DC | PRN
Start: 2022-10-11 — End: 2022-10-11
  Administered 2022-10-11: 20 mg via INTRAVENOUS

## 2022-10-11 MED ORDER — FAMOTIDINE 20 MG PO TABS: 20.0000 mg | ORAL_TABLET | Freq: Two times a day (BID) | ORAL | Status: DC

## 2022-10-11 MED ORDER — ROCURONIUM BROMIDE 50 MG/5ML IV SOLN
INTRAVENOUS | Status: DC | PRN
Start: 2022-10-11 — End: 2022-10-11
  Administered 2022-10-11: 50 mg via INTRAVENOUS

## 2022-10-11 MED ORDER — GLYCOPYRROLATE 0.2 MG/ML IJ SOLN: 0.2000 mg | INTRAMUSCULAR | Status: DC | PRN

## 2022-10-11 MED ORDER — PIPERACILLIN-TAZOBACTAM 3.375 G IVPB 30 MIN
3.3750 g | Freq: Once | INTRAVENOUS | Status: AC
  Administered 2022-10-11: 3.375 g via INTRAVENOUS

## 2022-10-11 MED ORDER — POLYVINYL ALCOHOL 1.4 % OP SOLN: 1.0000 [drp] | Freq: Four times a day (QID) | OPHTHALMIC | Status: DC | PRN

## 2022-10-11 MED ORDER — FENTANYL CITRATE PF 50 MCG/ML IJ SOSY: 25.0000 ug | PREFILLED_SYRINGE | Freq: Once | INTRAMUSCULAR | Status: DC

## 2022-10-11 MED ORDER — GLYCOPYRROLATE 1 MG PO TABS: 1.0000 mg | ORAL_TABLET | ORAL | Status: DC | PRN

## 2022-10-11 MED ORDER — ACETAMINOPHEN 325 MG PO TABS: 650.0000 mg | ORAL_TABLET | Freq: Four times a day (QID) | ORAL | Status: DC | PRN

## 2022-10-11 MED ORDER — SODIUM BICARBONATE 8.4 % IV SOLN
50.0000 meq | Freq: Once | INTRAVENOUS | Status: AC
  Administered 2022-10-11: 50 meq via INTRAVENOUS

## 2022-10-11 MED ORDER — NOREPINEPHRINE 4 MG/250ML-% IV SOLN
0.0000 ug/min | INTRAVENOUS | Status: DC
  Administered 2022-10-11: 26 ug/min via INTRAVENOUS
  Administered 2022-10-11: 10 ug/min via INTRAVENOUS
  Filled 2022-10-11: qty 250

## 2022-10-11 MED ORDER — METRONIDAZOLE 500 MG/100ML IV SOLN: 500.0000 mg | Freq: Once | INTRAVENOUS | Status: DC

## 2022-10-11 MED ORDER — VANCOMYCIN HCL 1250 MG/250ML IV SOLN
1250.0000 mg | Freq: Once | INTRAVENOUS | Status: AC
  Administered 2022-10-11: 1250 mg via INTRAVENOUS
  Filled 2022-10-11: qty 250

## 2022-10-11 MED ORDER — SODIUM BICARBONATE 8.4 % IV SOLN
INTRAVENOUS | Status: DC
  Filled 2022-10-11: qty 1000

## 2022-10-11 MED ORDER — ACETAMINOPHEN 650 MG RE SUPP: 650.0000 mg | Freq: Four times a day (QID) | RECTAL | Status: DC | PRN

## 2022-10-11 MED ORDER — FENTANYL 2500MCG IN NS 250ML (10MCG/ML) PREMIX INFUSION
25.0000 ug/h | INTRAVENOUS | Status: DC
  Administered 2022-10-11: 25 ug/h via INTRAVENOUS
  Filled 2022-10-11: qty 250

## 2022-10-11 MED ORDER — PIPERACILLIN-TAZOBACTAM 3.375 G IVPB: 3.3750 g | Freq: Three times a day (TID) | INTRAVENOUS | Status: DC

## 2022-10-11 MED ORDER — VASOPRESSIN 20 UNITS/100 ML INFUSION FOR SHOCK
0.0000 [IU]/min | INTRAVENOUS | Status: DC
  Administered 2022-10-11: 0.03 [IU]/min via INTRAVENOUS

## 2022-10-11 MED ORDER — PROPOFOL 1000 MG/100ML IV EMUL
0.0000 ug/kg/min | INTRAVENOUS | Status: DC
  Administered 2022-10-11: 10 ug/kg/min via INTRAVENOUS

## 2022-10-11 MED ORDER — FENTANYL BOLUS VIA INFUSION
25.0000 ug | INTRAVENOUS | Status: DC | PRN
  Administered 2022-10-11 (×2): 100 ug via INTRAVENOUS

## 2022-10-11 MED ORDER — DEXTROSE 50 % IV SOLN
INTRAVENOUS | Status: AC
  Filled 2022-10-11: qty 50

## 2022-10-11 MED ORDER — MORPHINE 100MG IN NS 100ML (1MG/ML) PREMIX INFUSION: 0.0000 mg/h | INTRAVENOUS | Status: DC

## 2022-10-11 MED ORDER — SODIUM CHLORIDE 0.9 % IV SOLN: 2.0000 g | Freq: Once | INTRAVENOUS | Status: DC

## 2022-10-11 MED ORDER — FENTANYL BOLUS VIA INFUSION: 50.0000 ug | INTRAVENOUS | Status: DC | PRN

## 2022-10-11 MED ORDER — DOCUSATE SODIUM 50 MG/5ML PO LIQD: 100.0000 mg | Freq: Two times a day (BID) | ORAL | Status: DC

## 2022-10-11 MED ORDER — VANCOMYCIN VARIABLE DOSE PER UNSTABLE RENAL FUNCTION (PHARMACIST DOSING): Status: DC

## 2022-10-11 MED ORDER — IOHEXOL 350 MG/ML SOLN
75.0000 mL | Freq: Once | INTRAVENOUS | Status: AC | PRN
  Administered 2022-10-11: 75 mL via INTRAVENOUS

## 2022-10-11 MED ORDER — LACTATED RINGERS IV BOLUS
1000.0000 mL | Freq: Once | INTRAVENOUS | Status: AC
  Administered 2022-10-11: 1000 mL via INTRAVENOUS

## 2022-10-11 MED ORDER — LACTATED RINGERS IV SOLN: INTRAVENOUS | Status: DC

## 2022-10-11 MED ORDER — MIDAZOLAM HCL 2 MG/2ML IJ SOLN
2.0000 mg | INTRAMUSCULAR | Status: DC | PRN
  Administered 2022-10-11: 2 mg via INTRAVENOUS
  Filled 2022-10-11: qty 2

## 2022-10-11 MED ORDER — POLYETHYLENE GLYCOL 3350 17 G PO PACK: 17.0000 g | PACK | Freq: Every day | ORAL | Status: DC | PRN

## 2022-10-11 MED ORDER — PIPERACILLIN-TAZOBACTAM IN DEX 2-0.25 GM/50ML IV SOLN
2.2500 g | Freq: Three times a day (TID) | INTRAVENOUS | Status: DC
  Filled 2022-10-11 (×2): qty 50

## 2022-10-11 MED ORDER — FENTANYL CITRATE PF 50 MCG/ML IJ SOSY: 50.0000 ug | PREFILLED_SYRINGE | Freq: Once | INTRAMUSCULAR | Status: DC

## 2022-10-11 MED ORDER — POLYETHYLENE GLYCOL 3350 17 G PO PACK: 17.0000 g | PACK | Freq: Every day | ORAL | Status: DC

## 2022-10-11 MED ORDER — MORPHINE BOLUS VIA INFUSION: 5.0000 mg | INTRAVENOUS | Status: DC | PRN

## 2022-10-11 MED ORDER — DEXTROSE 50 % IV SOLN
1.0000 | Freq: Once | INTRAVENOUS | Status: AC
  Administered 2022-10-11: 50 mL via INTRAVENOUS

## 2022-10-11 MED ORDER — DOCUSATE SODIUM 100 MG PO CAPS: 100.0000 mg | ORAL_CAPSULE | Freq: Two times a day (BID) | ORAL | Status: DC | PRN

## 2022-10-11 MED ORDER — SODIUM CHLORIDE 0.9 % IV SOLN: INTRAVENOUS | Status: DC

## 2022-10-11 MED ORDER — FENTANYL 2500MCG IN NS 250ML (10MCG/ML) PREMIX INFUSION: 50.0000 ug/h | INTRAVENOUS | Status: DC

## 2022-10-11 MED ORDER — HYDROCORTISONE SOD SUC (PF) 100 MG IJ SOLR: 100.0000 mg | Freq: Three times a day (TID) | INTRAMUSCULAR | Status: DC

## 2022-10-12 NOTE — Procedures (Signed)
Extubation Procedure Note  Patient Details:   Name: Christine Hurley DOB: Sep 02, 1950 MRN: [id number]   Airway Documentation:    Vent end date: (not recorded) Vent end time: (not recorded)   Evaluation  O2 sats: currently acceptable Complications: No apparent complications Patient did not tolerate procedure well. Bilateral Breath Sounds: Clear, Diminished   No  Vyron Fronczak 10/02/2022, 6:56 PM Pt compassionately extubated at this time while family present.

## 2022-10-12 NOTE — Progress Notes (Signed)
Patient transitioning to comfort soon, sepsis protocol d/c

## 2022-10-12 NOTE — H&P (Addendum)
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  Last known well 7 AM.  On arrival to the ER she was noted to be hypotensive and altered.  EKG showed ST elevations that were not significantly different from prior.  She is also having some left-sided weakness and neglect.  Code stroke is been activated.  PCCM consulted for admission.  History per family is patient is confused and dysarthric.  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 rate 20, SpO2 (!) 84%.       No intake or output data in the 24 hours ending 09/28/2022 1435 There were no vitals filed for this visit.  Examination: General: Ill-appearing woman lying in bed HENT: Mucous membranes dry, temporal wasting noted Lungs: Lungs have rhonchi worse on right, no accessory muscle use Cardiovascular: Heart sounds are bradycardic and irregular Abdomen: Abdomen is mildly protuberant, ultrasound shows marked hepatomegaly with volume loss and consolidation of the right lung Extremities: She has muscle wasting Neuro: She has left sided weakness, worse in left upper extremity she has right gaze preference, she is dysarthric GU: Deferred  CT  head pending EKG reviewed by interventional cardiologist on-call Chest x-ray shows right greater than left infiltrates  Resolved Hospital Problem list   Not applicable  Assessment & Plan:  Acute encephalopathy rule out stroke rule out hepatic encephalopathy Acute hypoxemic resp failure- may end up needing intubated, worried about breathing pattern Suspected aspiration of bilateral lower lobes Developing shock state- bedside echo more c/w septic picture HCC, advanced on immunotherapy but currently on hold Recent diagnosis of immune checkpoint myocarditis Hypoglycemia Acute kidney injury w/ hyperkalemia and acidemia  - Code stroke called, appreciate neuro help - Vanc/zosyn, sputum culture - Levophed, vasopressin to MAP 65 - Add stress steroids - Bicarb gtt - Limited echo, check Coox/CVP but doubt low flow - Very guarded prognosis, this is taking family hard, going to ask palliative to see  Best Practice (right click and "Reselect all SmartList Selections" daily)   Diet/type: NPO DVT prophylaxis: systemic heparin: can start tomorrow GI prophylaxis: N/A Lines: Central line Foley:  Yes, and it is still needed Code Status:  full code Last date of multidisciplinary goals of care discussion [at bedside 8/31]  Labs   CBC: No results for input(s): "WBC", "NEUTROABS", "HGB", "HCT", "MCV", "PLT" in the last 168 hours.  Basic Metabolic Panel: Recent Labs  Lab 10/07/22 1401  NA 140  K 4.5  CL 102  CO2 22  GLUCOSE 119*  BUN 13  CREATININE 0.83  CALCIUM 9.2   GFR: Estimated Creatinine Clearance: 57.4 mL/min (by C-G formula based on SCr of 0.83 mg/dL). No results for input(s): "PROCALCITON", "WBC", "LATICACIDVEN" in the last 168 hours.  Liver Function Tests: No results for input(s): "AST", "ALT", "  ALKPHOS", "BILITOT", "PROT", "ALBUMIN" in the last 168 hours. No results for input(s): "LIPASE", "AMYLASE" in the last 168 hours. No results for input(s): "AMMONIA" in the last 168  hours.  ABG    Component Value Date/Time   PHART 7.39 09/13/2022 1817   PCO2ART 25 (L) 09/13/2022 1817   PO2ART 119 (H) 09/13/2022 1817   HCO3 15.1 (L) 09/13/2022 1817   TCO2 18 (L) 09/13/2022 0743   ACIDBASEDEF 8.1 (H) 09/13/2022 1817   O2SAT 98.8 09/13/2022 1817     Coagulation Profile: No results for input(s): "INR", "PROTIME" in the last 168 hours.  Cardiac Enzymes: No results for input(s): "CKTOTAL", "CKMB", "CKMBINDEX", "TROPONINI" in the last 168 hours.  HbA1C: Hemoglobin A1C  Date/Time Value Ref Range Status  06/18/2017 01:20 PM 5.7  Final   Hgb A1c MFr Bld  Date/Time Value Ref Range Status  09/13/2022 07:27 AM 6.2 (H) 4.8 - 5.6 % Final    Comment:    (NOTE) Pre diabetes:          5.7%-6.4%  Diabetes:              >6.4%  Glycemic control for   <7.0% adults with diabetes   11/23/2015 06:58 AM 5.6 4.8 - 5.6 % Final    Comment:    (NOTE)         Pre-diabetes: 5.7 - 6.4         Diabetes: >6.4         Glycemic control for adults with diabetes: <7.0     CBG: Recent Labs  Lab 10/06/2022 1432  GLUCAP 43*    Review of Systems:   Too encephalopathic  Past Medical History:  She,  has a past medical history of Abnormal nuclear stress test (February '13), Arthritis, Chest pain with minimal risk for cardiac etiology (February 2013), COPD (chronic obstructive pulmonary disease) (HCC), Depression, Hepatitis C, Hypertension, Osteopenia of spine (05/22/2016), PAD (peripheral artery disease) (HCC) (10/01/2021), and Right tibial fracture ( ).   Surgical History:   Past Surgical History:  Procedure Laterality Date   DOPPLER ECHOCARDIOGRAPHY  03/26/2011   LVEF>55% normal LV wall thickness, normal LA, mild aortic sclerosis with trace to mild AI, mild to moderate TR, RVSP of , RA pressure about 5 mmHg   LEFT HEART CATH AND CORONARY ANGIOGRAPHY N/A 09/10/2022   Procedure: LEFT HEART CATH AND CORONARY ANGIOGRAPHY;  Surgeon: [place], Peter M, MD;  Location: [facility] INVASIVE  CV LAB;  Service: Cardiovascular;  Laterality: N/A;   NM MYOVIEW LTD  03/27/11   post stress ejection fraction is 72%, abnormal myocardial perfusion study, this is a low risk scan   TIBIA FRACTURE SURGERY Right 09/2008   fracture of right lower leg with open reduction and internal fixation     Social History:   reports that she has been smoking cigarettes. She has a 11.3 pack-year smoking history. She has never used smokeless tobacco. She reports current alcohol use of about 2.0 standard drinks of alcohol per week. She reports that she does not currently use drugs after having used the following drugs: Marijuana.   Family History:  Her family history includes Asthma in her mother; Cancer in her maternal grandmother and mother; Cancer - Prostate in her father.   Allergies Allergies  Allergen Reactions   Famotidine     "Made me tired and weak"     Home Medications  Prior to Admission medications   Medication Sig Start Date End Date Taking? Authorizing Provider  albuterol (VENTOLIN  HFA) 108 (90 Base) MCG/ACT inhaler INHALE 1 TO 2 PUFFS INTO THE LUNGS EVERY 6 HOURS AS NEEDED FOR WHEEZING OR SHORTNESS OF BREATH Patient taking differently: Inhale 2 puffs into the lungs every 6 (six) hours as needed for wheezing or shortness of breath. 07/10/20   Willow Ora, MD  apixaban (ELIQUIS) 5 MG TABS tablet Take 1 tablet (5mg ) by mouth twice daily Patient taking differently: Take 5 mg by mouth in the morning and at bedtime. 07/21/22   Ghimire, Werner Lean, MD  carvedilol (COREG) 3.125 MG tablet Take 1 tablet (3.125 mg total) by mouth 2 (two) times daily with a meal. 09/20/22 09/20/23  Pokhrel, Rebekah Chesterfield, MD  cilostazol (PLETAL) 100 MG tablet Take 100 mg by mouth 2 (two) times daily.    [provider]  colchicine 0.6 MG tablet Take 1 tablet (0.6 mg total) by mouth daily for 7 days. 09/21/22 09/28/22  Pokhrel, Rebekah Chesterfield, MD  empagliflozin (JARDIANCE) 10 MG TABS tablet Take 1 tablet (10 mg total) by mouth  daily. 09/21/22 09/21/23  Pokhrel, Rebekah Chesterfield, MD  furosemide (LASIX) 40 MG tablet Take 1 tablet (40 mg total) by mouth daily. 09/21/22 09/21/23  Pokhrel, Rebekah Chesterfield, MD  hydrocortisone 1 % ointment Apply 1 Application topically 2 (two) times daily. Patient taking differently: Apply 1 Application topically See admin instructions. Apply to affected areas 2 times a day 09/04/22   Shanon Ace, PA-C  hydrOXYzine (ATARAX) 10 MG tablet Take 1 tablet (10 mg total) by mouth at bedtime as needed for anxiety (sleep). 09/20/22   Pokhrel, Rebekah Chesterfield, MD  Iron-Vitamins (GERITOL COMPLETE) TABS Take 1 tablet by mouth daily with breakfast.    [provider]  losartan (COZAAR) 25 MG tablet Take 0.5 tablets (12.5 mg total) by mouth daily. 09/29/22   Little Ishikawa, MD  nicotine polacrilex (NICORETTE) 2 MG gum Take 1 each (2 mg total) by mouth as needed for smoking cessation. 09/20/22   Pokhrel, Rebekah Chesterfield, MD  pantoprazole (PROTONIX) 40 MG tablet Take 1 tablet (40 mg total) by mouth daily. 09/21/22 11/20/22  Pokhrel, Rebekah Chesterfield, MD  potassium chloride SA (KLOR-CON M) 20 MEQ tablet Take 1 tablet (20 mEq total) by mouth daily. 09/30/22   Little Ishikawa, MD  predniSONE (DELTASONE) 10 MG tablet Take 6 tablets (60 mg total) by mouth daily with breakfast for 4 days, THEN 5 tablets (50 mg total) daily with breakfast for 7 days, THEN 4 tablets (40 mg total) daily with breakfast for 7 days, THEN 3 tablets (30 mg total) daily with breakfast for 7 days, THEN 2 tablets (20 mg total) daily with breakfast for 7 days, THEN 1 tablet (10 mg total) daily with breakfast for 7 days, THEN 0.5 tablets (5 mg total) daily with breakfast for 7 days. 09/20/22 11/05/22  Pokhrel, Rebekah Chesterfield, MD  spironolactone (ALDACTONE) 25 MG tablet Take 0.5 tablets (12.5 mg total) by mouth daily. 09/21/22 09/21/23  Pokhrel, Rebekah Chesterfield, MD  Tiotropium Bromide Monohydrate (SPIRIVA RESPIMAT) 2.5 MCG/ACT AERS Inhale 2 puffs into the lungs daily. 09/20/22   Joycelyn Das, MD     Critical care time: 78 mins

## 2022-10-12 NOTE — Procedures (Signed)
Intubation Procedure Note  Christine Hurley  [id number]  1950-07-18  Date:09/28/2022  Time:3:56 PM   Provider Performing: Paramedic Molli Hazard under direction of Lorin Glass    Procedure: Intubation (31500)  Indication(s) Respiratory Failure  Consent Unable to obtain consent due to emergent nature of procedure.   Anesthesia Etomidate and Rocuronium   Time Out Verified patient identification, verified procedure, site/side was marked, verified correct patient position, special equipment/implants available, medications/allergies/relevant history reviewed, required imaging and test results available.   Sterile Technique Usual hand hygeine, masks, and gloves were used   Procedure Description Patient positioned in bed supine.  Sedation given as noted above.  Patient was intubated with endotracheal tube using  DL .  View was Grade 1 full glottis .  Number of attempts was 1.  Colorimetric CO2 detector was consistent with tracheal placement.   Complications/Tolerance None; patient tolerated the procedure well. Chest X-ray is ordered to verify placement.   EBL Minimal   Specimen(s) None

## 2022-10-12 NOTE — ED Triage Notes (Signed)
Pt BIB EMS as code STEMI. Pts son found pt lethargic and confused. LSN 0700. EMS states BP was 74/38. 3mm elevation on V4 and V3. EMS gave 750cc of NS.

## 2022-10-12 NOTE — Procedures (Signed)
Central Venous Catheter Insertion Procedure Note  Christine Hurley  [id number]  [birth year]  Date:10/06/2022  Time:3:54 PM   Provider Performing:Tawania Daponte Erby Pian   Procedure: Insertion of Non-tunneled Central Venous Catheter(36556) without US guidance  Indication(s) Medication administration  Consent Unable to obtain consent due to emergent nature of procedure.  Anesthesia Topical only with 1% lidocaine   Timeout Verified patient identification, verified procedure, site/side was marked, verified correct patient position, special equipment/implants available, medications/allergies/relevant history reviewed, required imaging and test results available.  Sterile Technique Maximal sterile technique including full sterile barrier drape, hand hygiene, sterile gown, sterile gloves, mask, hair covering, sterile ultrasound probe cover (if used).  Procedure Description Area of catheter insertion was cleaned with chlorhexidine and draped in sterile fashion.  Without real-time ultrasound guidance a central venous catheter was placed into the left subclavian vein. Nonpulsatile blood flow and easy flushing noted in all ports.  The catheter was sutured in place and sterile dressing applied.  Complications/Tolerance None; patient tolerated the procedure well. Chest X-ray is ordered to verify placement for internal jugular or subclavian cannulation.   Chest x-ray is not ordered for femoral cannulation.  EBL Minimal  Specimen(s) None

## 2022-10-12 NOTE — ED Notes (Signed)
CCM and EDP at bedside

## 2022-10-12 NOTE — ED Provider Notes (Signed)
EMERGENCY DEPARTMENT AT [facility] Provider Note   CSN: [id number] Arrival date & time: 10/10/2022  1344  An emergency department physician performed an initial assessment on this suspected stroke patient at 1437.  History  Chief Complaint  Patient presents with   Code STEMI    Christine Hurley is a 72 y.o. female.  HPI    72 y/o pt comes in w/ cc of confusion, weakness. Code STEMI was activated on the field.  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 She was last seen normal at 7 am by boyfriend, who then went to work. At noon when he arrived, she was on the floor, hard to comprehend. Pt at baseline able to walk, communicate.   Pt has no complaints. She denies chest pain, headache, abd pain.  Pt noted to be responding to pain over both extremities, but then noted to have L sided weakness, code stroke was activated.   Collateral history provided by patient's son - who I called and later patient's boyfriend, who came to the bedside.  Per son, patient has had some difficulty with her speech. Boyfriend later confirms that same, but adds that current exam is different than her usual speech.  Boyfriend also notes that patient normally active, sharp -despite the cancer. Per son, patient's cancer not progressing.   Home Medications Prior to Admission medications   Medication Sig Start Date End Date Taking? Authorizing Provider  albuterol (VENTOLIN HFA) 108 (90 Base) MCG/ACT inhaler INHALE 1 TO 2 PUFFS INTO THE LUNGS EVERY 6 HOURS AS NEEDED FOR WHEEZING OR SHORTNESS OF BREATH Patient taking differently: Inhale 2 puffs into the lungs every 6 (six) hours as needed for wheezing or shortness of breath. 07/10/20   Willow Ora, MD  apixaban (ELIQUIS) 5 MG TABS tablet Take 1 tablet (5mg ) by mouth twice daily Patient taking differently: Take 5 mg by mouth in the morning and at bedtime. 07/21/22    Ghimire, Werner Lean, MD  carvedilol (COREG) 3.125 MG tablet Take 1 tablet (3.125 mg total) by mouth 2 (two) times daily with a meal. 09/20/22 09/20/23  Pokhrel, Rebekah Chesterfield, MD  cilostazol (PLETAL) 100 MG tablet Take 100 mg by mouth 2 (two) times daily.    [provider]  colchicine 0.6 MG tablet Take 1 tablet (0.6 mg total) by mouth daily for 7 days. 09/21/22 09/28/22  Pokhrel, Rebekah Chesterfield, MD  empagliflozin (JARDIANCE) 10 MG TABS tablet Take 1 tablet (10 mg total) by mouth daily. 09/21/22 09/21/23  Pokhrel, Rebekah Chesterfield, MD  furosemide (LASIX) 40 MG tablet Take 1 tablet (40 mg total) by mouth daily. 09/21/22 09/21/23  Pokhrel, Rebekah Chesterfield, MD  hydrocortisone 1 % ointment Apply 1 Application topically 2 (two) times daily. Patient taking differently: Apply 1 Application topically See admin instructions. Apply to affected areas 2 times a day 09/04/22   Shanon Ace, PA-C  hydrOXYzine (ATARAX) 10 MG tablet Take 1 tablet (10 mg total) by mouth at bedtime as needed for anxiety (sleep). 09/20/22   Pokhrel, Rebekah Chesterfield, MD  Iron-Vitamins (GERITOL COMPLETE) TABS Take 1 tablet by mouth daily with breakfast.    [provider]  losartan (COZAAR) 25 MG tablet Take 0.5 tablets (12.5 mg total) by mouth daily. 09/29/22   Little Ishikawa, MD  nicotine polacrilex (NICORETTE) 2 MG gum Take 1 each (2 mg total) by mouth as needed for smoking cessation. 09/20/22   Pokhrel, Rebekah Chesterfield, MD  pantoprazole (  PROTONIX) 40 MG tablet Take 1 tablet (40 mg total) by mouth daily. 09/21/22 11/20/22  Pokhrel, Rebekah Chesterfield, MD  potassium chloride SA (KLOR-CON M) 20 MEQ tablet Take 1 tablet (20 mEq total) by mouth daily. 09/30/22   Little Ishikawa, MD  predniSONE (DELTASONE) 10 MG tablet Take 6 tablets (60 mg total) by mouth daily with breakfast for 4 days, THEN 5 tablets (50 mg total) daily with breakfast for 7 days, THEN 4 tablets (40 mg total) daily with breakfast for 7 days, THEN 3 tablets (30 mg total) daily with breakfast for 7 days,  THEN 2 tablets (20 mg total) daily with breakfast for 7 days, THEN 1 tablet (10 mg total) daily with breakfast for 7 days, THEN 0.5 tablets (5 mg total) daily with breakfast for 7 days. 09/20/22 11/05/22  Pokhrel, Rebekah Chesterfield, MD  spironolactone (ALDACTONE) 25 MG tablet Take 0.5 tablets (12.5 mg total) by mouth daily. 09/21/22 09/21/23  Pokhrel, Rebekah Chesterfield, MD  Tiotropium Bromide Monohydrate (SPIRIVA RESPIMAT) 2.5 MCG/ACT AERS Inhale 2 puffs into the lungs daily. 09/20/22   Pokhrel, Rebekah Chesterfield, MD      Allergies    Famotidine    Review of Systems   Review of Systems  Physical Exam Updated Vital Signs BP 102/82   Pulse 76   Temp (!) 96.7 F (35.9 C) (Temporal)   Resp (!) 30   Ht 5\' 6"  (1.676 m)   Wt 59.3 kg   SpO2 96%   BMI 21.11 kg/m  Physical Exam Vitals and nursing note reviewed.  Constitutional:      Appearance: She is well-developed. She is ill-appearing.     Comments: somnolent  HENT:     Head: Atraumatic.  Eyes:     General: Scleral icterus present.  Cardiovascular:     Rate and Rhythm: Normal rate.  Pulmonary:     Effort: Pulmonary effort is normal.     Breath sounds: Normal breath sounds.     Comments: Pt is tachypneic Abdominal:     Palpations: Abdomen is soft.     Tenderness: There is no abdominal tenderness.  Musculoskeletal:     Cervical back: Neck supple.  Skin:    General: Skin is warm.  Neurological:     Mental Status: She is oriented to person, place, and time.     Comments: Moving all 4 extremities, but L sided upper and lower extremity noted, patient is slurring with her speech     ED Results / Procedures / Treatments   Labs (all labs ordered are listed, but only abnormal results are displayed) Labs Reviewed  COMPREHENSIVE METABOLIC PANEL - Abnormal; Notable for the following components:      Result Value   Potassium 5.4 (*)    CO2 16 (*)    Glucose, Bld 100 (*)    BUN 58 (*)    Creatinine, Ser 2.26 (*)    Calcium 7.8 (*)    Total Protein 5.2 (*)     Albumin 2.6 (*)    AST 211 (*)    ALT 309 (*)    GFR, Estimated 23 (*)    All other components within normal limits  CBC WITH DIFFERENTIAL/PLATELET - Abnormal; Notable for the following components:   RBC 3.34 (*)    Hemoglobin 10.1 (*)    HCT 31.8 (*)    RDW 17.6 (*)    nRBC 1.3 (*)    All other components within normal limits  PROTIME-INR - Abnormal; Notable for the following components:   Prothrombin  Time 18.1 (*)    INR 1.5 (*)    All other components within normal limits  BRAIN NATRIURETIC PEPTIDE - Abnormal; Notable for the following components:   B Natriuretic Peptide 4,138.1 (*)    All other components within normal limits  AMMONIA - Abnormal; Notable for the following components:   Ammonia 44 (*)    All other components within normal limits  I-STAT CG4 LACTIC ACID, ED - Abnormal; Notable for the following components:   Lactic Acid, Venous 4.0 (*)    All other components within normal limits  I-STAT VENOUS BLOOD GAS, ED - Abnormal; Notable for the following components:   pH, Ven 7.213 (*)    pCO2, Ven 39.3 (*)    pO2, Ven 77 (*)    Bicarbonate 15.8 (*)    TCO2 17 (*)    Acid-base deficit 11.0 (*)    Potassium 5.7 (*)    Calcium, Ion 1.07 (*)    HCT 32.0 (*)    Hemoglobin 10.9 (*)    All other components within normal limits  CBG MONITORING, ED - Abnormal; Notable for the following components:   Glucose-Capillary 43 (*)    All other components within normal limits  I-STAT CHEM 8, ED - Abnormal; Notable for the following components:   Potassium 5.7 (*)    BUN 56 (*)    Creatinine, Ser 2.60 (*)    Calcium, Ion 1.06 (*)    TCO2 17 (*)    Hemoglobin 10.5 (*)    HCT 31.0 (*)    All other components within normal limits  TROPONIN I (HIGH SENSITIVITY) - Abnormal; Notable for the following components:   Troponin I (High Sensitivity) 22 (*)    All other components within normal limits  TROPONIN I (HIGH SENSITIVITY) - Abnormal; Notable for the following components:    Troponin I (High Sensitivity) 23 (*)    All other components within normal limits  RESP PANEL BY RT-PCR (RSV, FLU A&B, COVID)  RVPGX2  CULTURE, BLOOD (ROUTINE X 2)  CULTURE, BLOOD (ROUTINE X 2)  APTT  ETHANOL  PROCALCITONIN  URINALYSIS, W/ REFLEX TO CULTURE (INFECTION SUSPECTED)  BLOOD GAS, ARTERIAL  TRIGLYCERIDES  CBC  BASIC METABOLIC PANEL  MAGNESIUM  PHOSPHORUS  HEPATIC FUNCTION PANEL  I-STAT CG4 LACTIC ACID, ED    EKG None ED ECG REPORT   Date: 10/05/2022  Rate: 74  Rhythm: normal sinus rhythm  QRS Axis: normal  Intervals: normal  ST/T Wave abnormalities: ST elevations anteriorly  Conduction Disutrbances:nonspecific intraventricular conduction delay  Narrative Interpretation:   Old EKG Reviewed: unchanged  I have personally reviewed the EKG tracing and agree with the computerized printout as noted.   Radiology DG Chest Portable 1 View  Result Date: 09/14/2022 CLINICAL DATA:  Orogastric tube placement.  Lethargy and confusion. EXAM: PORTABLE CHEST 1 VIEW COMPARISON:  Radiographs 10/06/2022 and 09/25/2022.  CT 09/13/2022. FINDINGS: 1534 hours. Interval intubation. Tip of the endotracheal tube is 3.2 cm above the carina. A left subclavian central venous catheter has been placed, projecting laterally at the level of the azygous vein. Enteric tube projects below the diaphragm, tip not visualized. The heart size and mediastinal contours are stable with aortic atherosclerosis. There are right greater than left pleural effusions with increased airspace opacities at both lung bases. No evidence of pneumothorax. No acute osseous findings are seen. Telemetry leads overlie the chest. IMPRESSION: 1. Interval intubation and placement of a left subclavian central venous catheter as described. The tip of the catheter  projects laterally at the level of the azygous vein. 2. Increased bibasilar airspace opacities and pleural effusions. Electronically Signed   By: Carey Bullocks M.D.   On:  09/28/2022 16:44   DG Chest Port 1 View  Result Date: 10/09/2022 CLINICAL DATA:  Sepsis. EXAM: PORTABLE CHEST 1 VIEW COMPARISON:  09/25/2022 FINDINGS: Cardiac silhouette is enlarged. Patient rotated rightward. RIGHT pleural effusion increased from prior. No pneumothorax. No focal consolidation. IMPRESSION: Cardiomegaly and RIGHT pleural effusion. Electronically Signed   By: Genevive Bi M.D.   On: 09/16/2022 15:49   CT ANGIO HEAD NECK W WO CM (CODE STROKE)  Result Date: 10/06/2022 CLINICAL DATA:  Stroke, follow up EXAM: CT ANGIOGRAPHY HEAD AND NECK WITH AND WITHOUT CONTRAST TECHNIQUE: Multidetector CT imaging of the head and neck was performed using the standard protocol during bolus administration of intravenous contrast. Multiplanar CT image reconstructions and MIPs were obtained to evaluate the vascular anatomy. Carotid stenosis measurements (when applicable) are obtained utilizing NASCET criteria, using the distal internal carotid diameter as the denominator. RADIATION DOSE REDUCTION: This exam was performed according to the departmental dose-optimization program which includes automated exposure control, adjustment of the mA and/or kV according to patient size and/or use of iterative reconstruction technique. CONTRAST:  75mL OMNIPAQUE IOHEXOL 350 MG/ML SOLN COMPARISON:  None Available. FINDINGS: CTA NECK FINDINGS Aortic arch: Aortic atherosclerosis. Great vessel origins are patent without high-grade stenosis. Right carotid system: Atherosclerosis at the carotid bifurcation without greater than 50 % stenosis. Left carotid system: Atherosclerosis at the carotid bifurcation without greater than 50% stenosis. Vertebral arteries: Small vertebral arteries bilaterally. No evidence of high-grade stenosis. Skeleton: No evidence of acute abnormality on limited assessment. Multilevel degenerative change. Other neck: No acute abnormality on limited assessment. Upper chest: Layering right pleural effusion.  Review of the MIP images confirms the above findings CTA HEAD FINDINGS Anterior circulation: Bilateral ICAs are patent with moderate paraclinoid ICA stenosis bilaterally. Approximately 3 mm right MCA bifurcation aneurysm. Calcification within posterior/distal right M2/M3 MCA vessel (for example see series 7, image 133; series 8, image 178) with poor more distal opacification, suspicious for calcified embolus with resulting severe stenosis. Left MCA and bilateral ACAs are patent without proximal high-grade stenosis. Posterior circulation: Small vertebrobasilar system with bilateral fetal type PCAs, anatomic variant. Bilateral vertebral arteries and basilar artery are patent. Bilateral PCAs are patent without proximal high-grade stenosis. Venous sinuses: As permitted by contrast timing, patent. Anatomic variants: Detailed above. Review of the MIP images confirms the above findings IMPRESSION: 1. Calcification within posterior/distal right M2/M3 MCA vessel with likely severe resulting stenosis, suspicious for calcified embolus. 2. Approximately 3 mm right MCA bifurcation aneurysm. 3. Partially imaged right pleural effusion. Code stroke imaging results were communicated on 10/05/2022 at 2: 59 pm to provider Dr. Derry Lory via telephone, who verbally Electronically Signed   By: Feliberto Harts M.D.   On: 09/14/2022 15:17   CT HEAD CODE STROKE WO CONTRAST  Result Date: 09/23/2022 CLINICAL DATA:  Code stroke.  Neuro deficit, acute, stroke suspected EXAM: CT HEAD WITHOUT CONTRAST TECHNIQUE: Contiguous axial images were obtained from the base of the skull through the vertex without intravenous contrast. RADIATION DOSE REDUCTION: This exam was performed according to the departmental dose-optimization program which includes automated exposure control, adjustment of the mA and/or kV according to patient size and/or use of iterative reconstruction technique. COMPARISON:  None Available. FINDINGS: Motion limited study.   Within this limitation: Brain: Motion limited study with possible hypoattenuation and loss of gray differentiation in the  perirolandic right frontoparietal cortex. No evidence of acute hemorrhage, hydrocephalus, extra-axial collection or mass lesion/mass effect. Some hypoattenuation in the right frontal parietal region is favored to represent a prominent sulcus when correlating with reformatted imaging. Vascular: New calcific focus in the posterior right sylvian fissure. Recommend correlation forthcoming CTA. Skull: No acute fracture. Sinuses/Orbits: Clear sinuses.  No acute orbital findings. ASPECTS [facility] Stroke Program Early CT Score) total score (0-10 with 10 being normal): 9 IMPRESSION: 1. Motion limited study with suspected hypoattenuation and loss of gray differentiation in the perirolandic right frontoparietal cortex, possibly acute infarct (particularly given the reported clinical history). ASPECTS 9. 2. Additionally, new calcific focus in the posterior right sylvian fissure. Recommend correlation forthcoming CTA to evaluate for calcific embolus. 3. No evidence of acute hemorrhage. Code stroke imaging results were communicated on 09/21/2022 at 2:59 pm to provider Dr. Derry Lory via telephone, who verbally acknowledged these results. Electronically Signed   By: Feliberto Harts M.D.   On: 10/04/2022 15:05    Procedures .Critical Care  Performed by: Derwood Kaplan, MD Authorized by: Derwood Kaplan, MD   Critical care provider statement:    Critical care time (minutes):  49   Critical care was necessary to treat or prevent imminent or life-threatening deterioration of the following conditions:  Circulatory failure, CNS failure or compromise and shock   Critical care was time spent personally by me on the following activities:  Development of treatment plan with patient or surrogate, discussions with consultants, evaluation of patient's response to treatment, examination of patient, ordering and  review of laboratory studies, ordering and review of radiographic studies, ordering and performing treatments and interventions, pulse oximetry, re-evaluation of patient's condition, review of old charts, obtaining history from patient or surrogate and blood draw for specimens     Medications Ordered in ED Medications  lactated ringers infusion (has no administration in time range)  docusate sodium (COLACE) capsule 100 mg (has no administration in time range)  polyethylene glycol (MIRALAX / GLYCOLAX) packet 17 g (has no administration in time range)  famotidine (PEPCID) tablet 20 mg (has no administration in time range)  docusate (COLACE) 50 MG/5ML liquid 100 mg (has no administration in time range)  polyethylene glycol (MIRALAX / GLYCOLAX) packet 17 g (has no administration in time range)  fentaNYL (SUBLIMAZE) injection 25 mcg (has no administration in time range)  fentaNYL (SUBLIMAZE) bolus via infusion 25-100 mcg (100 mcg Intravenous Bolus from Bag 09/16/2022 1907)  propofol (DIPRIVAN) 1000 MG/100ML infusion (10 mcg/kg/min  59.3 kg Intravenous Infusion Verify 10/04/2022 1849)  norepinephrine (LEVOPHED) 4mg  in (0.016 mg/mL) premix infusion (14 mcg/min Intravenous Infusion Verify 09/14/2022 1849)  vasopressin (PITRESSIN) 20 Units in 100 mL (0.2 unit/mL) infusion-*FOR SHOCK* (0 Units/min Intravenous Stopped 09/13/2022 1655)  piperacillin-tazobactam (ZOSYN) IVPB 2.25 g (has no administration in time range)  vancomycin variable dose per unstable renal function (pharmacist dosing) (has no administration in time range)  sodium bicarbonate 150 mEq in dextrose 5 % 1,150 mL infusion ( Intravenous Stopped 09/26/2022 1734)  hydrocortisone sodium succinate (SOLU-CORTEF) 100 MG injection 100 mg (has no administration in time range)  0.9 %  sodium chloride infusion (has no administration in time range)  acetaminophen (TYLENOL) tablet 650 mg (has no administration in time range)    Or  acetaminophen (TYLENOL)  suppository 650 mg (has no administration in time range)  glycopyrrolate (ROBINUL) tablet 1 mg (has no administration in time range)    Or  glycopyrrolate (ROBINUL) injection 0.2 mg (has no administration in time  range)    Or  glycopyrrolate (ROBINUL) injection 0.2 mg (has no administration in time range)  polyvinyl alcohol (LIQUIFILM TEARS) 1.4 % ophthalmic solution 1 drop (has no administration in time range)  morphine bolus via infusion 5 mg (has no administration in time range)  morphine 100mg  in NS (1mg /mL) infusion - premix (has no administration in time range)  midazolam (VERSED) injection 2-4 mg (2 mg Intravenous Given 09/20/2022 1843)  fentaNYL (SUBLIMAZE) injection 50 mcg (has no administration in time range)  fentaNYL in NS (14mcg/ml) infusion-PREMIX (has no administration in time range)  fentaNYL (SUBLIMAZE) bolus via infusion 50-100 mcg (has no administration in time range)  vancomycin (VANCOREADY) IVPB 1250 mg/250 mL (0 mg Intravenous Stopped 09/19/2022 1707)  lactated ringers bolus 1,000 mL ( Intravenous Infusion Verify 09/23/2022 1558)  dextrose 50 % solution 50 mL (50 mLs Intravenous Given 09/19/2022 1437)  iohexol (OMNIPAQUE) 350 MG/ML injection 75 mL (75 mLs Intravenous Contrast Given 09/28/2022 1436)  piperacillin-tazobactam (ZOSYN) IVPB 3.375 g (3.375 g Intravenous New Bag/Given 09/19/2022 1511)  sodium bicarbonate injection 50 mEq (50 mEq Intravenous Given 10/05/2022 1503)    ED Course/ Medical Decision Making/ A&P                                 Medical Decision Making Amount and/or Complexity of Data Reviewed Labs: ordered. Radiology: ordered. ECG/medicine tests: ordered.  Risk Prescription drug management. Decision regarding hospitalization.   This patient presents to the ED with chief complaint(s) of cpde STEMI - but main complain is actually lethargy, fall, confusion with pertinent past medical history of advanced HCC w/ recent admission for shob and  nstemi with clean coronaries and myocrditis with low EF. Also has portal vein thrombosis and is on eliquis. The complaint involves an extensive differential diagnosis and also carries with it a high risk of complications and morbidity.    Patient is in shock. The differential diagnosis includes: ICH / Stroke, acute coronary syndrome, Infection - UTI/Pneumonia/soft tissue infection leading to encephalopathy, encephalopathy due to electrolyte abnormality or drug interactions or toxins or metabolic conditions like adrenal insufficiency, hyperglycemia, seizures.    I reviewed the case and previous records. Patient's rhythm strip reviewed. Clear STEMI criteria over anterior leads, but EKG at time of d/c same. I dont think patient has STEMI. I discussed case with Dr. Herbie Baltimore, Cardiology.   I then called home - there was no response. Then I called patient's son and received some collateral history. Patient is in shock, has significangt comorbidity burden, has myocarditis due to immunotherapy, which per Oncology has poor prognosis - I discussed with them current critical findings and poor prognosis. This appears to be acute change however, family is requesting full code.  I spoke with Dr. Katrinka Blazing, CCM. Plan was to treat patient as undifferentiated shock. Low suspicion for cardiogenic shock, as she is warm to touch. Sepsis treatment initiated.  Then we got more collateral history from husband, and we noted that patient has L sided weakness, it is new. Last normal 7 am - which is when boyfriend goet to work. LVO criteria - as there is unilateral weakness + possible neglect. Not a candidate for TNK as she is on eliquis.  CT confirms stroke findings. CCM admitting patient.  Additional history obtained: Additional history obtained from family and significant other Records reviewed previous admission documents and oncology, cardiology notes  Independent labs interpretation:  The following labs were  independently  interpreted: elevated CR, low blood sugar, elevated lactate  Independent visualization and interpretation of imaging: - I independently visualized the following imaging with scope of interpretation limited to determining acute life threatening conditions related to emergency care: Chest xray, which revealed likely pneumonia.  Consultation: - Consulted or discussed management/test interpretation with external professional: Critical care and Cardiology, then Neurology.   Final Clinical Impression(s) / ED Diagnoses Final diagnoses:  Shock (HCC)  AKI (acute kidney injury) (HCC)  Cerebrovascular accident (CVA) due to thrombosis of precerebral artery [facility])    Rx / DC Orders ED Discharge Orders     None         Derwood Kaplan, MD 09/27/2022 2350

## 2022-10-12 NOTE — Progress Notes (Deleted)
Cardiology Office Note:  .   Date:  11-05-2022  ID:  Kartier Mcquire, DOB Jan 22, 1951, MRN [id number] PCP: Willow Ora, MD  [facility] Providers Cardiologist:  Chilton Si, MD { Click to update primary MD,subspecialty MD or APP then REFRESH:1}    No chief complaint on file.   History of Present Illness: .     Christine Hurley is a *** 72 y.o. female *** with a PMH notable for *** who presents here for *** at the request of Willow Ora, MD.  Pricilla Larsson was last seen on ***     Subjective   INTERVAL HISTORY   ROS:  Cardiovascular ROS: {roscv:310661} Review of Systems - {ros master:310782}     Objective   Studies Reviewed: [name]        ECHO: *** CATH: *** MONITOR: *** CT: ***  Risk Assessment/Calculations:   {Does this patient have ATRIAL FIBRILLATION?:[phone]} No BP recorded.  {Refresh Note OR Click here to enter BP  :1}***         Physical Exam:   VS:  There were no vitals taken for this visit.   Wt Readings from Last 3 Encounters:  09/30/22 130 lb 12.8 oz (59.3 kg)  09/29/22 130 lb 3.2 oz (59.1 kg)  09/25/22 133 lb 6.1 oz (60.5 kg)    GEN: Well nourished, well developed in no acute distress; *** NECK: No JVD; No carotid bruits CARDIAC: Normal S1, S2; RRR, no murmurs, rubs, gallops RESPIRATORY:  Clear to auscultation without rales, wheezing or rhonchi ; nonlabored, good air movement. ABDOMEN: Soft, non-tender, non-distended EXTREMITIES:  No edema; No deformity      ASSESSMENT AND PLAN: .    Problem List Items Addressed This Visit   None       {Are you ordering a CV Procedure (e.g. stress test, cath, DCCV, TEE, etc)?   Press F2        :[id number]}   Dispo: No follow-ups on file.  Total time spent: *** min spent with patient + *** min spent charting = *** min  Signed, Marykay Lex, MD, MS Bryan Lemma, M.D., M.S. Interventional Cardiologist  [facility]  Pager # [phone] Phone # [phone] [address] [place], [place] [address]

## 2022-10-12 NOTE — Progress Notes (Signed)
RT attempted ABG x 2 unsuccessfully.  MD made aware.

## 2022-10-12 NOTE — Consult Note (Signed)
Neurology Consultation  Reason for Consult: CODE STROKE Referring Physician: Dr. Katrinka Blazing  CC: found unresponsive, code STEMI  History is obtained from: son at bedside, chart review  HPI: Latoyna Herget is a 72 y.o. female with PMH significant for Stage 3a hepatocellular carcinoma, Hep C, PAD, HTN, on Eliquis at home (portal vein thrombosis), acute myocarditis diagnosis 09/15/2022, current smoker who presented to the ED 8/31 after being found down at home by son. Code STEMI activated by EMS. Code stroke was activated by ED due to aphasia, left-sided weakness and slurred speech.  On exam, patient is hypotensive with BP in the low 80s, minimally responsive, follows some commands, exhibits left-sided weakness and neglect, right gaze preference, dysarthric speech. NIH  Taken emergently to imaging. CT/CTA shows calcified embolus vs calcified stenosis in distal right M2/M3. Case was discussed at length with Dr. Drusilla Kanner. Due to distal nature of the lesion, poor functional baseline and significant comorbidities and patient's continued poor hemodynamic state, she was not deemed a candidate for intervention. This was also discussed with her son, Emerson Monte, over the phone.  Upon return to ED room, patient was emergently intubated for airway protection and placed on pressors for BP support.   LKW: 0700 TNK given?: no, on Eliquis at home IR Thrombectomy? No, location is too distal and patient is hemodynamically unstable for procedures at this time.  Modified Rankin Scale: 2-Slight disability-UNABLE to perform all activities but does not need assistance  ROS: Unable to obtain due to altered mental status.   Past Medical History:  Diagnosis Date   Abnormal nuclear stress test February '13   Small, partially reversible inferolateral ischemia. Marked hypertensive response to exercise. Low risk.   Arthritis    Chest pain with minimal risk for cardiac etiology February 2013   Evaluated with echocardiogram and  Myoview, do   COPD (chronic obstructive pulmonary disease) (HCC)    Depression    Hepatitis C    Hypertension    Osteopenia of spine 05/22/2016   PAD (peripheral artery disease) (HCC) 10/01/2021   ABI: monophasic and decreased ABIs. Refer to vascular   Right tibial fracture      Family History  Problem Relation Age of Onset   Asthma Mother    Cancer Mother        Brain   Cancer - Prostate Father    Cancer Maternal Grandmother        GYN cancer    Social History:   reports that she has been smoking cigarettes. She has a 11.3 pack-year smoking history. She has never used smokeless tobacco. She reports current alcohol use of about 2.0 standard drinks of alcohol per week. She reports that she does not currently use drugs after having used the following drugs: Marijuana.  Medications  Current Facility-Administered Medications:    docusate (COLACE) 50 MG/5ML liquid 100 mg, 100 mg, Per Tube, BID, Harris, Whitney D, NP   docusate sodium (COLACE) capsule 100 mg, 100 mg, Oral, BID PRN, Lorin Glass, MD   etomidate (AMIDATE) injection, , Intravenous, Code/Trauma/Sedation Med, Lorin Glass, MD, 20 mg at 10/10/2022 1505   famotidine (PEPCID) tablet 20 mg, 20 mg, Per Tube, BID, Harris, Whitney D, NP   fentaNYL (SUBLIMAZE) bolus via infusion 25-100 mcg, 25-100 mcg, Intravenous, Q15 min PRN, Harris, Whitney D, NP   fentaNYL (SUBLIMAZE) injection 25 mcg, 25 mcg, Intravenous, Once, Harris, Whitney D, NP   fentaNYL in NS (66mcg/ml) infusion-PREMIX, 25-200 mcg/hr, Intravenous, Continuous, Harris, Whitney D, NP, Last  Rate: 2.5 mL/hr at 09/29/2022 1522, 25 mcg/hr at 09/26/2022 1522   lactated ringers infusion, , Intravenous, Continuous, Nanavati, Ankit, MD   norepinephrine (LEVOPHED) 4mg  in (0.016 mg/mL) premix infusion, 0-40 mcg/min, Intravenous, Continuous, Lorin Glass, MD, Last Rate: 112.5 mL/hr at 09/27/2022 1512, 30 mcg/min at 10/02/2022 1512   piperacillin-tazobactam (ZOSYN)  IVPB 3.375 g, 3.375 g, Intravenous, Once, Last Rate: 100 mL/hr at 09/19/2022 1511, 3.375 g at 10/01/2022 1511 **FOLLOWED BY** piperacillin-tazobactam (ZOSYN) IVPB 3.375 g, 3.375 g, Intravenous, Q8H, Lorin Glass, MD   polyethylene glycol (MIRALAX / GLYCOLAX) packet 17 g, 17 g, Oral, Daily PRN, Lorin Glass, MD   polyethylene glycol (MIRALAX / GLYCOLAX) packet 17 g, 17 g, Per Tube, Daily, Harris, Whitney D, NP   propofol (DIPRIVAN) 1000 MG/100ML infusion, 0-50 mcg/kg/min, Intravenous, Continuous, Harris, Whitney D, NP, Last Rate: 3.56 mL/hr at 09/25/2022 1512, 10 mcg/kg/min at 09/14/2022 1512   rocuronium (ZEMURON) injection, , Intravenous, Code/Trauma/Sedation Med, Lorin Glass, MD, 50 mg at 09/12/2022 1506   vancomycin (VANCOREADY) IVPB 1250 mg/250 mL, 1,250 mg, Intravenous, Once, Nanavati, Ankit, MD   vasopressin (PITRESSIN) 20 Units in 100 mL (0.2 unit/mL) infusion-*FOR SHOCK*, 0-0.03 Units/min, Intravenous, Continuous, Lorin Glass, MD, Last Rate: 9 mL/hr at 09/23/2022 1517, 0.03 Units/min at 09/28/2022 1517  Current Outpatient Medications:    albuterol (VENTOLIN HFA) 108 (90 Base) MCG/ACT inhaler, INHALE 1 TO 2 PUFFS INTO THE LUNGS EVERY 6 HOURS AS NEEDED FOR WHEEZING OR SHORTNESS OF BREATH (Patient taking differently: Inhale 2 puffs into the lungs every 6 (six) hours as needed for wheezing or shortness of breath.), Disp: 6.7 g, Rfl: 2   apixaban (ELIQUIS) 5 MG TABS tablet, Take 1 tablet (5mg ) by mouth twice daily (Patient taking differently: Take 5 mg by mouth in the morning and at bedtime.), Disp: 90 tablet, Rfl: 1   carvedilol (COREG) 3.125 MG tablet, Take 1 tablet (3.125 mg total) by mouth 2 (two) times daily with a meal., Disp: 30 tablet, Rfl: 2   cilostazol (PLETAL) 100 MG tablet, Take 100 mg by mouth 2 (two) times daily., Disp: , Rfl:    colchicine 0.6 MG tablet, Take 1 tablet (0.6 mg total) by mouth daily for 7 days., Disp: 7 tablet, Rfl: 0   empagliflozin (JARDIANCE) 10 MG TABS tablet, Take  1 tablet (10 mg total) by mouth daily., Disp: 30 tablet, Rfl: 2   furosemide (LASIX) 40 MG tablet, Take 1 tablet (40 mg total) by mouth daily., Disp: 30 tablet, Rfl: 2   hydrocortisone 1 % ointment, Apply 1 Application topically 2 (two) times daily. (Patient taking differently: Apply 1 Application topically See admin instructions. Apply to affected areas 2 times a day), Disp: 30 g, Rfl: 0   hydrOXYzine (ATARAX) 10 MG tablet, Take 1 tablet (10 mg total) by mouth at bedtime as needed for anxiety (sleep)., Disp: 30 tablet, Rfl: 0   Iron-Vitamins (GERITOL COMPLETE) TABS, Take 1 tablet by mouth daily with breakfast., Disp: , Rfl:    losartan (COZAAR) 25 MG tablet, Take 0.5 tablets (12.5 mg total) by mouth daily., Disp: 45 tablet, Rfl: 3   nicotine polacrilex (NICORETTE) 2 MG gum, Take 1 each (2 mg total) by mouth as needed for smoking cessation., Disp: 100 tablet, Rfl: 0   pantoprazole (PROTONIX) 40 MG tablet, Take 1 tablet (40 mg total) by mouth daily., Disp: 30 tablet, Rfl: 1   potassium chloride SA (KLOR-CON M) 20 MEQ tablet, Take 1 tablet (20 mEq total) by mouth  daily., Disp: 90 tablet, Rfl: 3   predniSONE (DELTASONE) 10 MG tablet, Take 6 tablets (60 mg total) by mouth daily with breakfast for 4 days, THEN 5 tablets (50 mg total) daily with breakfast for 7 days, THEN 4 tablets (40 mg total) daily with breakfast for 7 days, THEN 3 tablets (30 mg total) daily with breakfast for 7 days, THEN 2 tablets (20 mg total) daily with breakfast for 7 days, THEN 1 tablet (10 mg total) daily with breakfast for 7 days, THEN 0.5 tablets (5 mg total) daily with breakfast for 7 days., Disp: 30 tablet, Rfl: 0   spironolactone (ALDACTONE) 25 MG tablet, Take 0.5 tablets (12.5 mg total) by mouth daily., Disp: 15 tablet, Rfl: 2   Tiotropium Bromide Monohydrate (SPIRIVA RESPIMAT) 2.5 MCG/ACT AERS, Inhale 2 puffs into the lungs daily., Disp: 4 g, Rfl: 1  Exam: Current vital signs: BP 125/88   Pulse 78   Temp (!) 96.7 F  (35.9 C) (Temporal)   Resp (!) 27   SpO2 96%  Vital signs in last 24 hours: Temp:  [96.7 F (35.9 C)] 96.7 F (35.9 C) (08/31 1351) Pulse Rate:  [33-78] 78 (08/31 1518) Resp:  [13-30] 27 (08/31 1518) BP: (71-125)/(46-88) 125/88 (08/31 1518) SpO2:  [84 %-96 %] 96 % (08/31 1518) FiO2 (%):  [100 %] 100 % (08/31 1510)  GENERAL: appears critically ill, lying in bed Head: Normocephalic and atraumatic, without obvious abnormality EENT: Normal conjunctivae, dry mucous membranes, no OP obstruction LUNGS: labored, shallow breathing CV: Irregular rate and rhythm on telemetry Extremities: warm, well perfused, without obvious deformity  NEURO:  Mental Status:  Lethargic, minimal responsive.  Followed commands x1. Does not answer orientation questions, only said her name.  Speech/Language: speech is dysarthric and minimal. Left side neglect is noted Cranial Nerves:  II: PERRL sluggish. Unable to assess visual fields. III, IV, VI: Right gaze preference, does not track. Lid elevation symmetric and full.  V: UTA facial sensation. Does not blink to threat.  VII: Face is symmetric resting .  VIII: Hearing intact to voice IX, X: Palate elevation is symmetric.  XI: head is midline XII: Tongue protrudes midline    Motor:  Left hemiparesis present No drift present RUE.  Uncooperative with RLE Tone is normal. Bulk is reduced.  Sensation: withdraws to pain on right Coordination: UTA Gait: Deferred  NIHSS: 1a Level of Conscious.: 2 1b LOC Questions: 2 1c LOC Commands: 1 2 Best Gaze: 1 3 Visual: 0 4 Facial Palsy: 0 5a Motor Arm - left: 2 5b Motor Arm - Right: 0 6a Motor Leg - Left: 2 6b Motor Leg - Right: 1 7 Limb Ataxia: 0 8 Sensory: 0 9 Best Language: 2 10 Dysarthria: 2 11 Extinct. and Inatten.: 1 TOTAL: 16  Labs I have reviewed labs in epic and the results pertinent to this consultation are: PT/INR: 18.1/1.5 K: 5.7 BUN/Cr: 56/2.60 Lactic Acid: 4.0 pH Ven: 7.213 pCo2,  Ven: 39.3 pO2, Ven: 77 HCO3: 15.8   CBC    Component Value Date/Time   WBC 6.0 09/25/2022 1356   RBC 3.34 (L) 09/23/2022 1356   HGB 10.9 (L) 09/14/2022 1454   HGB 12.0 09/30/2022 1334   HCT 32.0 (L) 10/09/2022 1454   PLT 251 10/10/2022 1356   PLT 218 09/30/2022 1334   MCV 95.2 09/14/2022 1356   MCH 30.2 10/10/2022 1356   MCHC 31.8 09/20/2022 1356   RDW 17.6 (H) 09/30/2022 1356   LYMPHSABS 0.7 10/06/2022 1356   MONOABS  0.6 09/16/2022 1356   EOSABS 0.0 09/15/2022 1356   BASOSABS 0.0 10/04/2022 1356    CMP     Component Value Date/Time   NA 136 09/17/2022 1454   NA 140 10/07/2022 1401   K 5.7 (H) 09/30/2022 1454   CL 109 10/10/2022 1453   CO2 22 10/07/2022 1401   GLUCOSE 98 09/11/2022 1453   BUN 56 (H) 09/27/2022 1453   BUN 13 10/07/2022 1401   CREATININE 2.60 (H) 09/24/2022 1453   CREATININE 0.87 09/30/2022 1334   CREATININE 0.73 12/18/2017 1525   CALCIUM 9.2 10/07/2022 1401   PROT 6.5 09/30/2022 1334   PROT 6.3 09/29/2022 1047   ALBUMIN 3.4 (L) 09/30/2022 1334   ALBUMIN 3.6 (L) 09/29/2022 1047   AST 91 (H) 09/30/2022 1334   ALT 267 (H) 09/30/2022 1334   ALKPHOS 211 (H) 09/30/2022 1334   BILITOT 0.6 09/30/2022 1334   GFRNONAA >60 09/30/2022 1334   GFRAA >60 04/23/2017 1052    Lipid Panel     Component Value Date/Time   CHOL 159 09/13/2022 0727   TRIG 95 09/13/2022 0727   HDL 53 09/13/2022 0727   CHOLHDL 3.0 09/13/2022 0727   VLDL 19 09/13/2022 0727   LDLCALC 87 09/13/2022 0727   LDLDIRECT 72.0 01/17/2019 1020     Imaging I have reviewed the images obtained:  CT-scan of the brain:   Hypoattentuation and loss of gray differentiation in right fronto-parietal cortex.  ASPECTS 9  New calcific focus in posterior right sylvian fissure  CT Angio head and neck:  Calcification within posterior/distal right M2/M3 vessel with severe resulting stenosis, suspicious for calcified embolus.  3mm right MCA bifurcation aneurysm  Assessment: Latrivia Fitzke is a  72 y.o. female with PMH significant for Stage 3a hepatocellular carcinoma, Hep C, PAD, HTN, on Eliquis at home (portal vein thrombosis), acute myocarditis diagnosis 09/15/2022 who presented to the ED 8/31 after being found down at home by son. Code STEMI activated by EMS. Code stroke was activated by ED due to aphasia, left-sided weakness and slurred speech.  CT/CTA shows calcified embolus in distal right M2/M3.   Impression: Patient is unfortunately not an IR candidate doe to location of calcified embolus and her continued hemodynamic instability. Case was thoroughly discussed by Dr. Alphonzo Grieve and Dr. Corliss Skains, who both agreed. This plan of care was then discussed with her son, Emerson Monte, over the phone. Can consider a routine MRI once stable to further evaluate the right MCA infarct, but this will likely not change the treatment plan. Can order full stroke work-up. Patient had an echo earlier this month, no need to repeat. Patient also exhibits characteristics of encephalopathy, related to infectious process versus toxic-metabolic state.  Recommendations: - Frequent Neuro checks per stroke unit protocol - MRI Brain when stable - Lipid panel - Statin - will be started if LDL>70 or otherwise medically indicated - A1C - Antithrombotic - Eliquis - DVT ppx - SCDs - Smoking cessation - will counsel patient - SBP goal - <220, PRN labetalol if HR>60 and PRN Hydralazine if HR<60 - Telemetry monitoring for arrhythmia - 72h - Swallow screen - will be performed prior to PO intake - Stroke education - will be given - PT/OT/SLP - Dispo: ICU   Pt seen by Neuro NP/APP and later by MD. Note/plan to be edited by MD as needed.    Lynnae January, DNP, AGACNP-BC [facility] Please use AMION for contact information & EPIC for messaging.   NEUROHOSPITALIST ADDENDUM Performed a face  to face diagnostic evaluation.   I have reviewed the contents of history and physical exam as documented by  PA/ARNP/Resident and agree with above documentation.  I have discussed and formulated the above plan as documented. Edits to the note have been made as needed.  Impression/Key exam findings/plan: STEMI alert with significant hypotension. A code stroke was activated for R sided weakness noted in the ED. She was found to have lossof greywhite differentiation in right frontoparietal perirolandic sulcus concerning for possible acute infarct along with potential calcified distal M2 proximal M3 calcified stenosis/emboli. With her poor hemodynamics, poor baseline and significant comorbidities, she is not a candidate for intervention.  I spoke with Dr. Kerby Nora and he agrees that patient is not a good candidate.  I personally spoke with her son over phone to discuss this.  This patient is critically ill and at significant risk of neurological worsening, death and care requires constant monitoring of vital signs, hemodynamics,respiratory and cardiac monitoring, neurological assessment, discussion with family, other specialists and medical decision making of high complexity. I spent 40 minutes of neurocritical care time  in the care of  this patient. This was time spent independent of any time provided by nurse practitioner or PA.  Erick Blinks [facility] 09/21/2022  5:15 PM  Erick Blinks, MD [facility] [phone]   If 7pm to 7am, please call on call as listed on AMION.

## 2022-10-12 NOTE — Progress Notes (Addendum)
Pharmacy Antibiotic Note  Christine Hurley is a 72 y.o. female for which pharmacy has been consulted for vancomycin and zosyn dosing for sepsis.  Patient with a history of stage IIIa hepatocellular carcinoma, PAD, COPD, HCV, immune checkpoint inhibitor myocarditis. Patient presenting as a code stemi from EMS. STEMI activation was cancelled, but code stroke then activated based on presentation.  Pt is intubated on norepinephrine and vasopressin.  SCr 2.6 - AKI WBC 6; LA 4; T 96.7; HR 91  Plan: Zosyn 3.375g given in the ED -- will start on 2.25g q8h based on renal function Vancomycin 1250mg  given once in ED. Repeat dosing per level until renal function stable Monitor WBC, fever, renal function, cultures De-escalate when able     Temp (24hrs), Avg:96.7 F (35.9 C), Min:96.7 F (35.9 C), Max:96.7 F (35.9 C)  Recent Labs  Lab 10/07/22 1401  CREATININE 0.83    Estimated Creatinine Clearance: 57.4 mL/min (by C-G formula based on SCr of 0.83 mg/dL).    Allergies  Allergen Reactions   Famotidine     "Made me tired and weak"   Microbiology results: Pending  Thank you for allowing pharmacy to be a part of this patient's care.  Delmar Landau, PharmD, BCPS 09/19/2022 2:38 PM ED Clinical Pharmacist -  [phone]

## 2022-10-12 NOTE — Progress Notes (Addendum)
   09/16/2022 1400  Spiritual Encounters  Type of Visit Attempt (pt unavailable)  Care provided to: Pt not available  Conversation partners present during encounter Nurse  Referral source Code page  OnCall Visit Yes   Arrived at code STEMI, provided staff support.  STEMI activation was cancelled, and code STROKE was activated. Patient is intubated. Requesting follow up on patient.

## 2022-10-12 NOTE — Consult Note (Cosign Needed Addendum)
Palliative Care Consult Note                                  Date: 09/29/2022   Patient Name: Christine Hurley  DOB: [birth year]  MRN: [id number]  Age / Sex: 72 y.o., female  PCP: Willow Ora, MD Referring Physician: Lorin Glass, MD  Reason for Consultation: Establishing goals of care  HPI/Patient Profile: 72 y.o. female  with past medical history of Stage 3a hepatocellular carcinoma, Hep C, PAD, HTN, on Eliquis at home (portal vein thrombosis), acute myocarditis diagnosis 09/15/2022, current smoker admitted on 09/15/2022 after being found down at home. Code STEMI activated by EMS. Code stroke was activated by ED due to aphasia, left-sided weakness and slurred speech. CT/CTA shows calcified embolus vs calcified stenosis in distal right M2/M3. Case was discussed at length with Dr. Drusilla Kanner. Due to distal nature of the lesion, poor functional baseline and significant comorbidities and patient's continued poor hemodynamic state, she was not deemed a candidate for intervention. She was intubated, placed on pressors for BP support and admitted to ICU.  Past Medical History:  Diagnosis Date   Abnormal nuclear stress test February '13   Small, partially reversible inferolateral ischemia. Marked hypertensive response to exercise. Low risk.   Arthritis    Chest pain with minimal risk for cardiac etiology February 2013   Evaluated with echocardiogram and Myoview, do   COPD (chronic obstructive pulmonary disease) (HCC)    Depression    Hepatitis C    Hypertension    Osteopenia of spine 05/22/2016   PAD (peripheral artery disease) (HCC) 10/01/2021   ABI: monophasic and decreased ABIs. Refer to vascular   Right tibial fracture      Subjective:   I have reviewed medical records including EPIC notes, labs and imaging, received update from RN and MD, assessed the patient and then met with the patient's family to discuss diagnosis prognosis, GOC,  EOL wishes, disposition and options.  I introduced Palliative Medicine as specialized medical care for people living with serious illness. It focuses on providing relief from symptoms and stress of a serious illness. The goal is to improve quality of life for both the patient and the family.  Today's Discussion: Met with patient's son, daughter-in-law, and boyfriend. The patient has liver cancer and myocarditis. She patient had been hospitalized several times in the last two months but they feel had really started to improve. Her family notes that she has always been very active and was "a young 68." Her having a stroke and being in her current condition is a big surprise for them all and they are understandably upset. Emotional support provided.The patient's son shares that his mother recently shared during a hospitalization she was "ready to go home" to heaven. He does not want to stand in the way of this and would like to follow her wishes to die with dignity.  We discussed liberating her from the ventilator. The plan is to allow family to visit with her and say goodbyes before a compassionate extubation. Explained that we would stop any medications that she does not need and do not provide comfort. We will have medications available to ensure she does not suffer.   Discussed the importance of continued conversation with family and the medical providers regarding overall plan of care and treatment options, ensuring decisions are within the context of the patient's values and GOCs.  Questions and  concerns were addressed. Hard Choices booklet left for review. The family was encouraged to call with questions or concerns. PMT will continue to support holistically.  Review of Systems  Unable to perform ROS   Objective:   Primary Diagnoses: Present on Admission:  Acute metabolic encephalopathy   Physical Exam Constitutional:      Appearance: She is ill-appearing.     Interventions: She is  sedated and intubated.  Pulmonary:     Effort: She is intubated.  Skin:    General: Skin is warm and dry.     Vital Signs:  BP (!) 141/106   Pulse 76   Temp (!) 96.7 F (35.9 C) (Temporal)   Resp (!) 0   SpO2 96%   Palliative Assessment/Data: 10%    Advanced Care Planning:   Existing Vynca/ACP Documentation: None  Primary Decision Maker: Tawana Scale the patient's son.  Code Status/Advance Care Planning: DNR  Assessment & Plan:   SUMMARY OF RECOMMENDATIONS   DNR Plan for compassionate extubation 8/31 Comfort orders placed by Dr. Katrinka Blazing Continued PMT support   Prognosis:  Hours - Days  Discharge Planning:  Anticipated Hospital Death   Discussed with: bedside RN and Dr. Katrinka Blazing  Time Total: 105 minutes  Thank you for allowing [place] to participate in the care of Christine Hurley PMT will continue to support holistically.   Signed by: Sarina Ser, NP Palliative Medicine Team  Team Phone # [phone] (Nights/Weekends)  09/27/2022, 5:37 PM

## 2022-10-12 NOTE — Progress Notes (Signed)
[facility]link following for sepsis protocol. 

## 2022-10-12 NOTE — Code Documentation (Signed)
Stroke Response Nurse Documentation Code Documentation  Christine Hurley is a 72 y.o. female arriving to [facility]  via [facility] on 09/26/2022 with past medical hx of CA, HTN, Hep C, PAD, myocarditis. On Eliquis (apixaban) daily. Code stroke was activated by ED.   Patient from home where she was LKW at 0700 and now complaining of confusion and lethargy.  Per her boyfriend she was normal this am when he left at 0700.  Her son found her down at the house later.  Code Stemi was initially activated prior to arrival. Code stroke was activated for left side weakness, aphasia and slurred speech.   Stroke team at the bedside on patient arrival. Labs drawn and patient cleared for CT by Dr. Rhunette Croft. Patient to CT with team. NIHSS 16, see documentation for details and code stroke times. Patient with decreased LOC, disoriented, not following commands, right gaze preference , left arm weakness, bilateral leg weakness, Global aphasia , dysarthria , and left neglect on exam. The following imaging was completed:  CT Head and CTA. Patient is not a candidate for IV Thrombolytic due to Last known well outside window and she is on Eliquis. Patient is not a candidate for IR due to distal embolus and patient hemodynamics.   Care Plan: VS and NIHSS q 2 hours x 12 hours.   Bedside handoff with ED RN [place].    Marcellina Millin  Stroke Response RN

## 2022-10-12 NOTE — Progress Notes (Signed)
Family now considering comfort care.  Will hold on arterial line placement for now.  Myrla Halsted MD PCCM

## 2022-10-12 NOTE — Progress Notes (Signed)
RT assisted with transport of this pt from ED to 90M6. Pt tolerated well with SVS and no complications. 90M RT notified.

## 2022-10-12 DEATH — deceased

## 2022-10-14 ENCOUNTER — Telehealth: Payer: Self-pay | Admitting: Family Medicine

## 2022-10-14 NOTE — Telephone Encounter (Signed)
Caller was Mel, PT from [facility]. States he was calling to request VO for discharge due to non- compliance. Patient has been marked as deceased and [facility] is not aware of this. Can this be confirmed? Mel can be reached back @ [phone].

## 2022-10-14 NOTE — Telephone Encounter (Signed)
Spoke with Mel to confirm that the pt is deceased.

## 2022-10-15 ENCOUNTER — Inpatient Hospital Stay: Payer: Medicare HMO | Admitting: Family Medicine

## 2022-10-15 ENCOUNTER — Telehealth: Payer: Self-pay | Admitting: Family Medicine

## 2022-10-15 NOTE — Telephone Encounter (Signed)
Patient dropped off document Home Health Certificate (Order ID [id number]), to be filled out by provider. Patient requested to send it back via Fax within 7-days. Document is located in providers tray at front office.Please advise

## 2022-10-16 LAB — CULTURE, BLOOD (ROUTINE X 2)
Culture: NO GROWTH
Culture: NO GROWTH
Special Requests: ADEQUATE
Special Requests: ADEQUATE

## 2022-10-28 ENCOUNTER — Ambulatory Visit: Payer: Medicare HMO | Admitting: Vascular Surgery

## 2022-10-28 ENCOUNTER — Encounter (HOSPITAL_COMMUNITY): Payer: Medicare HMO

## 2022-10-28 NOTE — Telephone Encounter (Signed)
Forms has been faxed.

## 2022-10-29 ENCOUNTER — Other Ambulatory Visit: Payer: Medicare HMO

## 2022-10-29 ENCOUNTER — Ambulatory Visit: Payer: Medicare HMO | Admitting: Hematology

## 2022-10-30 ENCOUNTER — Institutional Professional Consult (permissible substitution): Payer: Medicare HMO | Admitting: Internal Medicine

## 2022-11-11 NOTE — Death Summary Note (Signed)
DEATH SUMMARY   Patient Details  Name: Christine Hurley MRN: [id number] DOB: [birth year]  Admission/Discharge Information   Admit Date:  2022-10-14  Date of Death: Date of Death: 2022-10-14  Time of Death: Time of Death: 1918/10/22  Length of Stay: 1  Referring Physician: Willow Ora, MD    Diagnoses  Preliminary cause of death: stroke Secondary Diagnoses (including complications and co-morbidities):   Septic shock secondary to aspiration pneumonia of bilateral lower lobes Acute kidney injury with acidemia HepC Stage IIIA HCC on immunotherapy Portal vein thrombosis on Eliquis Peripheral vascular disease COPD Immune checkpoint inhibitor myocarditis    Brief Hospital Course (including significant findings, care, treatment, and services provided and events leading to death)  72 year old woman w/ hx of HepC, Stage IIIA HCC on immunotherapy, portal vein thrombosis on Eliquis, peripheral vascular disease, COPD, 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  Last known well 7 AM.  On arrival to the ER she was noted to be hypotensive and altered.  EKG showed ST elevations that were not significantly different from prior.  She is also having some left-sided weakness and neglect.  Code stroke is been activated.  PCCM consulted for admission.  History per family is patient is confused and dysarthric.   Patient deteriorated rapidly in ER and had to be intubated for airway protection.  CT head showed likely embolic strokes on right.  Given baseline QoL, poor prognosis from myocarditis, family decided to transition goals of care to comfort.   Pertinent Labs and Studies  Significant Diagnostic Studies DG Chest Portable 1 View  Result Date: 10/14/2022 CLINICAL DATA:  Orogastric tube placement.  Lethargy and confusion. EXAM: PORTABLE CHEST 1 VIEW COMPARISON:  Radiographs 2022-10-14 and 09/25/2022.  CT 09/13/2022. FINDINGS: 1534  hours. Interval intubation. Tip of the endotracheal tube is 3.2 cm above the carina. A left subclavian central venous catheter has been placed, projecting laterally at the level of the azygous vein. Enteric tube projects below the diaphragm, tip not visualized. The heart size and mediastinal contours are stable with aortic atherosclerosis. There are right greater than left pleural effusions with increased airspace opacities at both lung bases. No evidence of pneumothorax. No acute osseous findings are seen. Telemetry leads overlie the chest. IMPRESSION: 1. Interval intubation and placement of a left subclavian central venous catheter as described. The tip of the catheter projects laterally at the level of the azygous vein. 2. Increased bibasilar airspace opacities and pleural effusions. Electronically Signed   By: Carey Bullocks M.D.   On: 10/14/2022 16:44   DG Chest Port 1 View  Result Date: Oct 14, 2022 CLINICAL DATA:  Sepsis. EXAM: PORTABLE CHEST 1 VIEW COMPARISON:  09/25/2022 FINDINGS: Cardiac silhouette is enlarged. Patient rotated rightward. RIGHT pleural effusion increased from prior. No pneumothorax. No focal consolidation. IMPRESSION: Cardiomegaly and RIGHT pleural effusion. Electronically Signed   By: Genevive Bi M.D.   On: 10/14/22 15:49   CT ANGIO HEAD NECK W WO CM (CODE STROKE)  Result Date: October 14, 2022 CLINICAL DATA:  Stroke, follow up EXAM: CT ANGIOGRAPHY HEAD AND NECK WITH AND WITHOUT CONTRAST TECHNIQUE: Multidetector CT imaging of the head and neck was performed using the standard protocol during bolus administration of intravenous contrast. Multiplanar CT image reconstructions and MIPs were obtained to evaluate the vascular anatomy. Carotid stenosis measurements (when applicable) are obtained utilizing NASCET criteria, using the distal internal carotid diameter as the denominator. RADIATION DOSE REDUCTION: This exam was performed according to the departmental  dose-optimization program  which includes automated exposure control, adjustment of the mA and/or kV according to patient size and/or use of iterative reconstruction technique. CONTRAST:  75mL OMNIPAQUE IOHEXOL 350 MG/ML SOLN COMPARISON:  None Available. FINDINGS: CTA NECK FINDINGS Aortic arch: Aortic atherosclerosis. Great vessel origins are patent without high-grade stenosis. Right carotid system: Atherosclerosis at the carotid bifurcation without greater than 50 % stenosis. Left carotid system: Atherosclerosis at the carotid bifurcation without greater than 50% stenosis. Vertebral arteries: Small vertebral arteries bilaterally. No evidence of high-grade stenosis. Skeleton: No evidence of acute abnormality on limited assessment. Multilevel degenerative change. Other neck: No acute abnormality on limited assessment. Upper chest: Layering right pleural effusion. Review of the MIP images confirms the above findings CTA HEAD FINDINGS Anterior circulation: Bilateral ICAs are patent with moderate paraclinoid ICA stenosis bilaterally. Approximately 3 mm right MCA bifurcation aneurysm. Calcification within posterior/distal right M2/M3 MCA vessel (for example see series 7, image 133; series 8, image 178) with poor more distal opacification, suspicious for calcified embolus with resulting severe stenosis. Left MCA and bilateral ACAs are patent without proximal high-grade stenosis. Posterior circulation: Small vertebrobasilar system with bilateral fetal type PCAs, anatomic variant. Bilateral vertebral arteries and basilar artery are patent. Bilateral PCAs are patent without proximal high-grade stenosis. Venous sinuses: As permitted by contrast timing, patent. Anatomic variants: Detailed above. Review of the MIP images confirms the above findings IMPRESSION: 1. Calcification within posterior/distal right M2/M3 MCA vessel with likely severe resulting stenosis, suspicious for calcified embolus. 2. Approximately 3 mm right MCA bifurcation aneurysm. 3.  Partially imaged right pleural effusion. Code stroke imaging results were communicated on 09/23/2022 at 2: 59 pm to provider Dr. Derry Lory via telephone, who verbally Electronically Signed   By: Feliberto Harts M.D.   On: 10/03/2022 15:17   CT HEAD CODE STROKE WO CONTRAST  Result Date: 10/08/2022 CLINICAL DATA:  Code stroke.  Neuro deficit, acute, stroke suspected EXAM: CT HEAD WITHOUT CONTRAST TECHNIQUE: Contiguous axial images were obtained from the base of the skull through the vertex without intravenous contrast. RADIATION DOSE REDUCTION: This exam was performed according to the departmental dose-optimization program which includes automated exposure control, adjustment of the mA and/or kV according to patient size and/or use of iterative reconstruction technique. COMPARISON:  None Available. FINDINGS: Motion limited study.  Within this limitation: Brain: Motion limited study with possible hypoattenuation and loss of gray differentiation in the perirolandic right frontoparietal cortex. No evidence of acute hemorrhage, hydrocephalus, extra-axial collection or mass lesion/mass effect. Some hypoattenuation in the right frontal parietal region is favored to represent a prominent sulcus when correlating with reformatted imaging. Vascular: New calcific focus in the posterior right sylvian fissure. Recommend correlation forthcoming CTA. Skull: No acute fracture. Sinuses/Orbits: Clear sinuses.  No acute orbital findings. ASPECTS [facility] Stroke Program Early CT Score) total score (0-10 with 10 being normal): 9 IMPRESSION: 1. Motion limited study with suspected hypoattenuation and loss of gray differentiation in the perirolandic right frontoparietal cortex, possibly acute infarct (particularly given the reported clinical history). ASPECTS 9. 2. Additionally, new calcific focus in the posterior right sylvian fissure. Recommend correlation forthcoming CTA to evaluate for calcific embolus. 3. No evidence of acute  hemorrhage. Code stroke imaging results were communicated on 09/26/2022 at 2:59 pm to provider Dr. Derry Lory via telephone, who verbally acknowledged these results. Electronically Signed   By: Feliberto Harts M.D.   On: 10/10/2022 15:05   DG Chest 2 View  Result Date: 09/25/2022 CLINICAL DATA:  Shortness of breath EXAM: CHEST -  2 VIEW COMPARISON:  X-ray 09/15/2022 FINDINGS: Tiny pleural effusions versus pleural thickening. Underinflation with some right basilar atelectasis. No pneumothorax or edema. Normal cardiopericardial silhouette. Calcified tortuous aorta. Overlapping cardiac leads. Degenerative changes seen along the spine IMPRESSION: Underinflation. Basilar atelectasis. Question tiny effusion versus pleural thickening. Tortuous and ectatic aorta. Electronically Signed   By: Karen Kays M.D.   On: 09/25/2022 15:27   DG Swallowing Func-Speech Pathology  Result Date: 09/18/2022 Table formatting from the original result was not included. Images from the original result were not included. Modified Barium Swallow Study Patient Details Name: Zareah Kleinsasser MRN: [id number] Date of Birth: 12-Jun-1950 Today's Date: 09/18/2022 HPI/PMH: HPI: Patient is a 72 y.o. female with PMH:  COPD, HTN, arthritis, depression, hepatitis C, osteopenia of spine, hepatocellular carcinoma-liver mass (s/p 1 treatment with tremelimumab and durvalumab on 7/5/20240). She presented to the ED on 09/13/22 with three days h/o weakness, SOB on exertion. She was found to have markedly high troponins, cardiology consulted with diagnosis of non-ST elevation MI. On 09/17/22, patient reported to Cardiologist that she was having difficulty swallowing, sensation of food sticking in throat and SLP swallow evaluation ordered. Clinical Impression: Pt presents with functional oropharyngeal swallowing.  There was no penetration or aspiration of any consistencies trialed.  There was pharyngeal stasis of solid textures with deposition of reside on posterior  pharyngeal wall, with is suspected to be 2/2 cervical osteophytes (see image below, or image series 13 in video recording of study in PACS), to which pt was sensate.  There was coughing and attempt to bring residual portion of bolus back up before it was cleared with subsequent swallow.  This is very bothersome to pt.  She notes that it helps to turn her head to the side when episodes like this occur, which may result in the realignment of bony projections to the side of the pharynx instead of impinging into the hypopharynx.  Otherwise there was very little pharyngeal residue and bolus flow was not impeded by potential osteophytes, and she achieves full epiglottic inversion during swallow.  During pill simulation there was brief vallecular stasis of tablet which cleared with subsequent swallow.  On esophageal sweep there was retention of contrast within the esophagus.  Pt is safe to continue current diet, but if symptoms persist and are bothersome, she may benefit from further evaluation to address structual causes of symptoms.  Pt has no further acute ST needs. Recommend regular texture diet with thin liquids.  Recommend choosing softer foods to pt preferece and continuing compensatory strategies (head turn) which pt has already been implementing. Factors that may increase risk of adverse event in presence of aspiration Rubye Oaks [employer] Clearance Coots 2021): No data recorded Recommendations/Plan: Swallowing Evaluation Recommendations Swallowing Evaluation Recommendations Recommendations: PO diet PO Diet Recommendation: Regular; Thin liquids (Level 0) Liquid Administration via: Cup; Straw Medication Administration: Whole meds with liquid (as tolerated) Supervision: Patient able to self-feed Swallowing strategies  : Slow rate; Small bites/sips; Multiple dry swallows after each bite/sip (head turn as needed) Postural changes: Position pt fully upright for meals Oral care recommendations: Oral care BID (2x/day) Recommended consults:  -- (consider workup for presumed osteophytes) Treatment Plan Treatment Plan Treatment recommendations: No treatment recommended at this time Follow-up recommendations: No SLP follow up Functional status assessment: Patient has not had a recent decline in their functional status. Treatment frequency: -- (N/A) Recommendations Recommendations for follow up therapy are one component of a multi-disciplinary discharge planning process, led by the attending physician.  Recommendations may be updated  based on patient status, additional functional criteria and insurance authorization. Assessment: Orofacial Exam: Orofacial Exam Oral Cavity: Oral Hygiene: WFL Oral Cavity - Dentition: Adequate natural dentition; Missing dentition Orofacial Anatomy: WFL Oral Motor/Sensory Function: -- (see BSE) Anatomy: Anatomy: Suspected cervical osteophytes Boluses Administered: Boluses Administered Boluses Administered: Thin liquids (Level 0); Mildly thick liquids (Level 2, nectar thick); Moderately thick liquids (Level 3, honey thick); Puree; Solid  Oral Impairment Domain: Oral Impairment Domain Lip Closure: No labial escape Tongue control during bolus hold: Cohesive bolus between tongue to palatal seal Bolus preparation/mastication: Timely and efficient chewing and mashing Bolus transport/lingual motion: Brisk tongue motion Oral residue: Trace residue lining oral structures Location of oral residue : Tongue Initiation of pharyngeal swallow : Posterior angle of the ramus  Pharyngeal Impairment Domain: Pharyngeal Impairment Domain Soft palate elevation: No bolus between soft palate (SP)/pharyngeal wall (PW) Laryngeal elevation: Complete superior movement of thyroid cartilage with complete approximation of arytenoids to epiglottic petiole Anterior hyoid excursion: Complete anterior movement Epiglottic movement: Complete inversion Laryngeal vestibule closure: Complete, no air/contrast in laryngeal vestibule Pharyngeal stripping wave : Present -  complete Pharyngeal contraction (A/P view only): N/A Pharyngoesophageal segment opening: Complete distension and complete duration, no obstruction of flow Tongue base retraction: Trace column of contrast or air between tongue base and PPW Pharyngeal residue: Collection of residue within or on pharyngeal structures Location of pharyngeal residue: Valleculae; Pharyngeal wall  Esophageal Impairment Domain: Esophageal Impairment Domain Esophageal clearance upright position: Esophageal retention Pill: Pill Consistency administered: Thin liquids (Level 0) Thin liquids (Level 0): -- (vallecular stasis) Penetration/Aspiration Scale Score: Penetration/Aspiration Scale Score 1.  Material does not enter airway: Thin liquids (Level 0); Mildly thick liquids (Level 2, nectar thick); Moderately thick liquids (Level 3, honey thick); Puree; Solid; Pill Compensatory Strategies: Compensatory Strategies Compensatory strategies: Yes Multiple swallows: Effective Effective Multiple Swallows: Pill; Solid Liquid wash: Effective Effective Liquid Wash: Pill   General Information: Caregiver present: No  Diet Prior to this Study: Regular; Thin liquids (Level 0)   Temperature : Normal   No data recorded  Supplemental O2: Nasal cannula   History of Recent Intubation: No  Behavior/Cognition: Alert; Pleasant mood; Cooperative Self-Feeding Abilities: Able to self-feed Baseline vocal quality/speech: Normal No data recorded Volitional Swallow: Able to elicit Exam Limitations: No limitations Goal Planning: Prognosis for improved oropharyngeal function: -- (N/A) No data recorded No data recorded Patient/Family Stated Goal: to be able to breathe better Consulted and agree with results and recommendations: Patient; Physician Pain: Pain Assessment Pain Assessment: Faces Faces Pain Scale: 0 End of Session: Start Time:SLP Start Time (ACUTE ONLY): 1039 Stop Time: SLP Stop Time (ACUTE ONLY): 1056 Time Calculation:SLP Time Calculation (min) (ACUTE ONLY): 17 min  Charges: SLP Evaluations $ SLP Speech Visit: 1 Visit SLP Evaluations $MBS Swallow: 1 Procedure SLP visit diagnosis: SLP Visit Diagnosis: Dysphagia, unspecified (R13.10) Past Medical History: Past Medical History: Diagnosis Date  Abnormal nuclear stress test February '13  Small, partially reversible inferolateral ischemia. Marked hypertensive response to exercise. Low risk.  Arthritis   Chest pain with minimal risk for cardiac etiology February 2013  Evaluated with echocardiogram and Myoview, do  COPD (chronic obstructive pulmonary disease) (HCC)   Depression   Hepatitis C   Hypertension   Osteopenia of spine 05/22/2016  PAD (peripheral artery disease) (HCC) 10/01/2021  ABI: monophasic and decreased ABIs. Refer to vascular  Right tibial fracture   Past Surgical History: Past Surgical History: Procedure Laterality Date  DOPPLER ECHOCARDIOGRAPHY  03/26/2011  LVEF>55% normal LV wall thickness,  normal LA, mild aortic sclerosis with trace to mild AI, mild to moderate TR, RVSP of , RA pressure about 5 mmHg  LEFT HEART CATH AND CORONARY ANGIOGRAPHY N/A 09/10/2022  Procedure: LEFT HEART CATH AND CORONARY ANGIOGRAPHY;  Surgeon: [place], Peter M, MD;  Location: [facility] INVASIVE CV LAB;  Service: Cardiovascular;  Laterality: N/A;  NM MYOVIEW LTD  03/27/11  post stress ejection fraction is 72%, abnormal myocardial perfusion study, this is a low risk scan  TIBIA FRACTURE SURGERY Right 09/2008  fracture of right lower leg with open reduction and internal fixation Kerrie Pleasure, MA, CCC-SLP Acute Rehabilitation Services Office: [phone] 09/18/2022, 11:44 AM  DG CHEST PORT 1 VIEW  Result Date: 09/16/2022 CLINICAL DATA:  [id number] with shortness of breath. EXAM: PORTABLE CHEST 1 VIEW COMPARISON:  Portable chest and CTA chest both 09/13/2022. FINDINGS: The lungs expiratory with consolidation or atelectasis in the hypoinflated lower lung zones and small right pleural effusion with minimal left pleural fluid. The lungs are otherwise clear  as far as visualized. The aorta is tortuous and calcified with stable mediastinum. There is mild cardiomegaly without CHF pattern. Degenerative change thoracic spine and slight dextroscoliosis. Comparison to the prior study reveals no significant change in overall aeration. IMPRESSION: 1. Expiratory chest x-ray with consolidation or atelectasis in the hypoinflated lower lung zones and small right pleural effusion with minimal left pleural fluid. 2. Mild cardiomegaly. Electronically Signed   By: Almira Bar M.D.   On: 09/16/2022 00:50   MR CARDIAC MORPHOLOGY W WO CONTRAST  Result Date: 09/15/2022 CLINICAL DATA:  Evaluate for myocarditis EXAM: CARDIAC MRI TECHNIQUE: The patient was scanned on a 1.5 Tesla Siemens magnet. A dedicated cardiac coil was used. Functional imaging was done using Fiesta sequences. 2,3, and 4 chamber views were done to assess for RWMA's. Modified Simpson's rule using a short axis stack was used to calculate an ejection fraction on a dedicated work [employer], [employer]. The patient received 6 cc of Gadavist. After 10 minutes inversion recovery sequences were used to assess for infiltration and scar tissue. Phase contrast velocity mapping was performed above the aortic and pulmonic valves CONTRAST:  6 cc  of Gadavist FINDINGS: Left ventricle: -Normal size -Normal wall thickness -Moderate systolic dysfunction -Elevated ECV (35%) -Elevated myocardial T2 values throughout mid and apical slices -Patchy midwall LGE in mid to apical septum -RV insertion site LGE LV EF:  35% (Normal 52-79%) Absolute volumes: LV EDV: (Normal 78-167 mL) LV ESV: 76mL (Normal 21-64 mL) LV SV: 42mL (Normal 52-114 mL) CO: 4.5L/min (Normal 2.7-6.3 L/min) Indexed volumes: LV EDV: 80mL/sq-m (Normal 50-96 mL/sq-m) LV ESV: 3mL/sq-m (Normal 10-40 mL/sq-m) LV SV: 64mL/sq-m (Normal 33-64 mL/sq-m) CI: 2.6L/min/sq-m (Normal 1.9-3.9 L/min/sq-m) Right ventricle: Normal size with moderate systolic dysfunction RV EF:  30% (Normal 52-80%) Absolute volumes: RV EDV: (Normal 79-175 mL) RV ESV: (Normal 13-75 mL) RV SV: 45mL (Normal 56-110 mL) CO: 4.8L/min (Normal 2.7-6 L/min) Indexed volumes: RV EDV: 25mL/sq-m (Normal 51-97 mL/sq-m) RV ESV: 1mL/sq-m (Normal 9-42 mL/sq-m) RV SV: 31mL/sq-m (Normal 35-61 mL/sq-m) CI: 2.8L/min/sq-m (Normal 1.8-3.8 L/min/sq-m) Left atrium: Mild enlargement Right atrium: Normal size Mitral valve: Trivial regurgitation Aortic valve: Trivial regurgitation Tricuspid valve: Trivial regurgitation Pulmonic valve: Trivial regurgitation Aorta: Dilated ascending aorta measuring 39mm Pericardium: Small effusion Extracardiac structures: Small to moderate right pleural effusion IMPRESSION: 1. Meets criteria for acute myocarditis, with regional elevation in myocardial T2 values and patchy midwall LGE in mid to apical septum. 2. RV insertion site LGE, which is  a nonspecific scar pattern often seen in setting of elevated pulmonary pressures 3. Normal LV size, normal wall thickness, and moderate systolic dysfunction (EF 35%) 4.  Normal RV size with moderate systolic dysfunction (EF 30%) Electronically Signed   By: Epifanio Lesches M.D.   On: 09/15/2022 17:40   MR CARDIAC VELOCITY FLOW MAP  Result Date: 09/15/2022 CLINICAL DATA:  Evaluate for myocarditis EXAM: CARDIAC MRI TECHNIQUE: The patient was scanned on a 1.5 Tesla Siemens magnet. A dedicated cardiac coil was used. Functional imaging was done using Fiesta sequences. 2,3, and 4 chamber views were done to assess for RWMA's. Modified Simpson's rule using a short axis stack was used to calculate an ejection fraction on a dedicated work [employer] officer, [employer]. The patient received 6 cc of Gadavist. After 10 minutes inversion recovery sequences were used to assess for infiltration and scar tissue. Phase contrast velocity mapping was performed above the aortic and pulmonic valves CONTRAST:  6 cc  of Gadavist FINDINGS: Left ventricle: -Normal size  -Normal wall thickness -Moderate systolic dysfunction -Elevated ECV (35%) -Elevated myocardial T2 values throughout mid and apical slices -Patchy midwall LGE in mid to apical septum -RV insertion site LGE LV EF:  35% (Normal 52-79%) Absolute volumes: LV EDV: (Normal 78-167 mL) LV ESV: 76mL (Normal 21-64 mL) LV SV: 42mL (Normal 52-114 mL) CO: 4.5L/min (Normal 2.7-6.3 L/min) Indexed volumes: LV EDV: 18mL/sq-m (Normal 50-96 mL/sq-m) LV ESV: 60mL/sq-m (Normal 10-40 mL/sq-m) LV SV: 80mL/sq-m (Normal 33-64 mL/sq-m) CI: 2.6L/min/sq-m (Normal 1.9-3.9 L/min/sq-m) Right ventricle: Normal size with moderate systolic dysfunction RV EF: 30% (Normal 52-80%) Absolute volumes: RV EDV: (Normal 79-175 mL) RV ESV: (Normal 13-75 mL) RV SV: 45mL (Normal 56-110 mL) CO: 4.8L/min (Normal 2.7-6 L/min) Indexed volumes: RV EDV: 98mL/sq-m (Normal 51-97 mL/sq-m) RV ESV: 52mL/sq-m (Normal 9-42 mL/sq-m) RV SV: 40mL/sq-m (Normal 35-61 mL/sq-m) CI: 2.8L/min/sq-m (Normal 1.8-3.8 L/min/sq-m) Left atrium: Mild enlargement Right atrium: Normal size Mitral valve: Trivial regurgitation Aortic valve: Trivial regurgitation Tricuspid valve: Trivial regurgitation Pulmonic valve: Trivial regurgitation Aorta: Dilated ascending aorta measuring 39mm Pericardium: Small effusion Extracardiac structures: Small to moderate right pleural effusion IMPRESSION: 1. Meets criteria for acute myocarditis, with regional elevation in myocardial T2 values and patchy midwall LGE in mid to apical septum. 2. RV insertion site LGE, which is a nonspecific scar pattern often seen in setting of elevated pulmonary pressures 3. Normal LV size, normal wall thickness, and moderate systolic dysfunction (EF 35%) 4.  Normal RV size with moderate systolic dysfunction (EF 30%) Electronically Signed   By: Epifanio Lesches M.D.   On: 09/15/2022 17:40   MR CARDIAC VELOCITY FLOW MAP  Result Date: 09/15/2022 CLINICAL DATA:  Evaluate for myocarditis EXAM: CARDIAC MRI  TECHNIQUE: The patient was scanned on a 1.5 Tesla Siemens magnet. A dedicated cardiac coil was used. Functional imaging was done using Fiesta sequences. 2,3, and 4 chamber views were done to assess for RWMA's. Modified Simpson's rule using a short axis stack was used to calculate an ejection fraction on a dedicated work [employer] officer, [employer]. The patient received 6 cc of Gadavist. After 10 minutes inversion recovery sequences were used to assess for infiltration and scar tissue. Phase contrast velocity mapping was performed above the aortic and pulmonic valves CONTRAST:  6 cc  of [facility] FINDINGS: Left ventricle: -Normal size -Normal wall thickness -Moderate systolic dysfunction -Elevated ECV (35%) -Elevated myocardial T2 values throughout mid and apical slices -Patchy midwall LGE in mid to apical septum -RV insertion site LGE  LV EF:  35% (Normal 52-79%) Absolute volumes: LV EDV: (Normal 78-167 mL) LV ESV: 76mL (Normal 21-64 mL) LV SV: 42mL (Normal 52-114 mL) CO: 4.5L/min (Normal 2.7-6.3 L/min) Indexed volumes: LV EDV: 60mL/sq-m (Normal 50-96 mL/sq-m) LV ESV: 21mL/sq-m (Normal 10-40 mL/sq-m) LV SV: 71mL/sq-m (Normal 33-64 mL/sq-m) CI: 2.6L/min/sq-m (Normal 1.9-3.9 L/min/sq-m) Right ventricle: Normal size with moderate systolic dysfunction RV EF: 30% (Normal 52-80%) Absolute volumes: RV EDV: (Normal 79-175 mL) RV ESV: (Normal 13-75 mL) RV SV: 45mL (Normal 56-110 mL) CO: 4.8L/min (Normal 2.7-6 L/min) Indexed volumes: RV EDV: 14mL/sq-m (Normal 51-97 mL/sq-m) RV ESV: 84mL/sq-m (Normal 9-42 mL/sq-m) RV SV: 38mL/sq-m (Normal 35-61 mL/sq-m) CI: 2.8L/min/sq-m (Normal 1.8-3.8 L/min/sq-m) Left atrium: Mild enlargement Right atrium: Normal size Mitral valve: Trivial regurgitation Aortic valve: Trivial regurgitation Tricuspid valve: Trivial regurgitation Pulmonic valve: Trivial regurgitation Aorta: Dilated ascending aorta measuring 39mm Pericardium: Small effusion Extracardiac structures: Small to  moderate right pleural effusion IMPRESSION: 1. Meets criteria for acute myocarditis, with regional elevation in myocardial T2 values and patchy midwall LGE in mid to apical septum. 2. RV insertion site LGE, which is a nonspecific scar pattern often seen in setting of elevated pulmonary pressures 3. Normal LV size, normal wall thickness, and moderate systolic dysfunction (EF 35%) 4.  Normal RV size with moderate systolic dysfunction (EF 30%) Electronically Signed   By: Epifanio Lesches M.D.   On: 09/15/2022 17:40    Microbiology Recent Results (from the past 240 hour(s))  Blood Culture (routine x 2)     Status: None (Preliminary result)   Collection Time: 09/27/2022  2:05 PM   Specimen: BLOOD  Result Value Ref Range Status   Specimen Description BLOOD RIGHT ANTECUBITAL  Final   Special Requests   Final    BOTTLES DRAWN AEROBIC AND ANAEROBIC Blood Culture adequate volume   Culture   Final    NO GROWTH 3 DAYS Performed at [facility], [address], [place], [place] [address]    Report Status PENDING  Incomplete  Blood Culture (routine x 2)     Status: None (Preliminary result)   Collection Time: 09/13/2022  4:11 PM   Specimen: BLOOD RIGHT HAND  Result Value Ref Range Status   Specimen Description BLOOD RIGHT HAND  Final   Special Requests   Final    BOTTLES DRAWN AEROBIC AND ANAEROBIC Blood Culture adequate volume   Culture   Final    NO GROWTH 3 DAYS Performed at [facility], [address], [place], [place] [address]    Report Status PENDING  Incomplete    Lab Basic Metabolic Panel: Recent Labs  Lab 09/17/2022 1356 10/02/2022 1453 09/17/2022 1454  NA 138 136 136  K 5.4* 5.7* 5.7*  CL 107 109  --   CO2 16*  --   --   GLUCOSE 100* 98  --   BUN 58* 56*  --   CREATININE 2.26* 2.60*  --   CALCIUM 7.8*  --   --    Liver Function Tests: Recent Labs  Lab 09/23/2022 1356  AST 211*  ALT 309*  ALKPHOS 114  BILITOT 0.8  PROT 5.2*  ALBUMIN 2.6*   No results for  input(s): "LIPASE", "AMYLASE" in the last 168 hours. Recent Labs  Lab 09/20/2022 1419  AMMONIA 44*   CBC: Recent Labs  Lab 10/04/2022 1356 09/24/2022 1453 09/21/2022 1454  WBC 6.0  --   --   NEUTROABS 4.7  --   --   HGB  10.1* 10.5* 10.9*  HCT 31.8* 31.0* 32.0*  MCV 95.2  --   --   PLT 251  --   --    Cardiac Enzymes: No results for input(s): "CKTOTAL", "CKMB", "CKMBINDEX", "TROPONINI" in the last 168 hours. Sepsis Labs: Recent Labs  Lab 09/21/2022 1356 09/18/2022 1454  PROCALCITON 1.25  --   WBC 6.0  --   LATICACIDVEN  --  4.0*     Lorin Glass 10/14/2022, 2:49 PM

## 2022-11-24 ENCOUNTER — Other Ambulatory Visit: Payer: Medicare HMO

## 2023-04-21 ENCOUNTER — Other Ambulatory Visit (HOSPITAL_COMMUNITY): Payer: Self-pay

## 2023-04-22 ENCOUNTER — Other Ambulatory Visit (HOSPITAL_COMMUNITY): Payer: Self-pay

## 2023-04-24 ENCOUNTER — Other Ambulatory Visit (HOSPITAL_COMMUNITY): Payer: Self-pay
